# Patient Record
Sex: Female | Born: 1945 | Race: White | Hispanic: No | Smoking: Former smoker
Health system: Southern US, Community
[De-identification: ages and names within clinical notes are randomized; demographics above are authoritative.]

## PROBLEM LIST (undated history)

## (undated) DIAGNOSIS — Z923 Personal history of irradiation: Secondary | ICD-10-CM

## (undated) DIAGNOSIS — R252 Cramp and spasm: Secondary | ICD-10-CM

## (undated) DIAGNOSIS — D126 Benign neoplasm of colon, unspecified: Secondary | ICD-10-CM

## (undated) DIAGNOSIS — C55 Malignant neoplasm of uterus, part unspecified: Secondary | ICD-10-CM

## (undated) DIAGNOSIS — M1990 Unspecified osteoarthritis, unspecified site: Secondary | ICD-10-CM

## (undated) DIAGNOSIS — R7303 Prediabetes: Secondary | ICD-10-CM

## (undated) DIAGNOSIS — I10 Essential (primary) hypertension: Secondary | ICD-10-CM

## (undated) DIAGNOSIS — E785 Hyperlipidemia, unspecified: Secondary | ICD-10-CM

## (undated) DIAGNOSIS — R51 Headache: Secondary | ICD-10-CM

## (undated) DIAGNOSIS — I219 Acute myocardial infarction, unspecified: Secondary | ICD-10-CM

## (undated) DIAGNOSIS — E119 Type 2 diabetes mellitus without complications: Secondary | ICD-10-CM

## (undated) DIAGNOSIS — J189 Pneumonia, unspecified organism: Secondary | ICD-10-CM

## (undated) DIAGNOSIS — F419 Anxiety disorder, unspecified: Secondary | ICD-10-CM

## (undated) DIAGNOSIS — I2510 Atherosclerotic heart disease of native coronary artery without angina pectoris: Secondary | ICD-10-CM

## (undated) DIAGNOSIS — I209 Angina pectoris, unspecified: Secondary | ICD-10-CM

## (undated) DIAGNOSIS — K219 Gastro-esophageal reflux disease without esophagitis: Secondary | ICD-10-CM

## (undated) HISTORY — PX: COLONOSCOPY: SHX174

## (undated) HISTORY — PX: POLYPECTOMY: SHX149

## (undated) HISTORY — DX: Unspecified osteoarthritis, unspecified site: M19.90

## (undated) HISTORY — DX: Essential (primary) hypertension: I10

## (undated) HISTORY — DX: Benign neoplasm of colon, unspecified: D12.6

## (undated) HISTORY — DX: Prediabetes: R73.03

## (undated) HISTORY — PX: UPPER GASTROINTESTINAL ENDOSCOPY: SHX188

## (undated) HISTORY — DX: Hyperlipidemia, unspecified: E78.5

## (undated) HISTORY — DX: Atherosclerotic heart disease of native coronary artery without angina pectoris: I25.10

## (undated) HISTORY — PX: BACK SURGERY: SHX140

## (undated) HISTORY — DX: Malignant neoplasm of uterus, part unspecified: C55

## (undated) HISTORY — DX: Acute myocardial infarction, unspecified: I21.9

## (undated) HISTORY — DX: Cramp and spasm: R25.2

---

## 1994-06-13 HISTORY — PX: CORONARY ARTERY BYPASS GRAFT: SHX141

## 1996-07-09 ENCOUNTER — Encounter: Payer: Self-pay | Admitting: Gastroenterology

## 1998-04-20 ENCOUNTER — Other Ambulatory Visit: Admission: RE | Admit: 1998-04-20 | Discharge: 1998-04-20 | Payer: Self-pay | Admitting: Obstetrics & Gynecology

## 1998-08-05 ENCOUNTER — Other Ambulatory Visit: Admission: RE | Admit: 1998-08-05 | Discharge: 1998-08-05 | Payer: Self-pay | Admitting: Obstetrics & Gynecology

## 1998-08-05 ENCOUNTER — Other Ambulatory Visit: Admission: RE | Admit: 1998-08-05 | Discharge: 1998-08-05 | Payer: Self-pay

## 1999-05-24 ENCOUNTER — Other Ambulatory Visit: Admission: RE | Admit: 1999-05-24 | Discharge: 1999-05-24 | Payer: Self-pay | Admitting: Obstetrics and Gynecology

## 1999-08-04 ENCOUNTER — Encounter: Payer: Self-pay | Admitting: Gastroenterology

## 2000-06-08 ENCOUNTER — Other Ambulatory Visit: Admission: RE | Admit: 2000-06-08 | Discharge: 2000-06-08 | Payer: Self-pay | Admitting: Obstetrics and Gynecology

## 2000-08-21 ENCOUNTER — Encounter (INDEPENDENT_AMBULATORY_CARE_PROVIDER_SITE_OTHER): Payer: Self-pay

## 2000-08-21 ENCOUNTER — Encounter: Payer: Self-pay | Admitting: Gastroenterology

## 2000-08-21 ENCOUNTER — Other Ambulatory Visit: Admission: RE | Admit: 2000-08-21 | Discharge: 2000-08-21 | Payer: Self-pay | Admitting: Gastroenterology

## 2000-10-25 ENCOUNTER — Ambulatory Visit (HOSPITAL_COMMUNITY): Admission: RE | Admit: 2000-10-25 | Discharge: 2000-10-25 | Payer: Self-pay | Admitting: Cardiology

## 2000-10-27 ENCOUNTER — Encounter: Payer: Self-pay | Admitting: Cardiology

## 2000-10-27 ENCOUNTER — Ambulatory Visit (HOSPITAL_COMMUNITY): Admission: RE | Admit: 2000-10-27 | Discharge: 2000-10-27 | Payer: Self-pay | Admitting: Cardiology

## 2001-06-11 ENCOUNTER — Other Ambulatory Visit: Admission: RE | Admit: 2001-06-11 | Discharge: 2001-06-11 | Payer: Self-pay | Admitting: Obstetrics and Gynecology

## 2002-01-18 ENCOUNTER — Encounter: Payer: Self-pay | Admitting: Orthopedic Surgery

## 2002-01-25 ENCOUNTER — Inpatient Hospital Stay (HOSPITAL_COMMUNITY): Admission: RE | Admit: 2002-01-25 | Discharge: 2002-01-27 | Payer: Self-pay | Admitting: Orthopedic Surgery

## 2002-01-25 ENCOUNTER — Encounter: Payer: Self-pay | Admitting: Orthopedic Surgery

## 2002-06-14 ENCOUNTER — Other Ambulatory Visit: Admission: RE | Admit: 2002-06-14 | Discharge: 2002-06-14 | Payer: Self-pay | Admitting: Obstetrics and Gynecology

## 2003-06-19 ENCOUNTER — Other Ambulatory Visit: Admission: RE | Admit: 2003-06-19 | Discharge: 2003-06-19 | Payer: Self-pay | Admitting: Obstetrics and Gynecology

## 2003-07-20 ENCOUNTER — Emergency Department (HOSPITAL_COMMUNITY): Admission: EM | Admit: 2003-07-20 | Discharge: 2003-07-20 | Payer: Self-pay | Admitting: Emergency Medicine

## 2004-06-21 ENCOUNTER — Other Ambulatory Visit: Admission: RE | Admit: 2004-06-21 | Discharge: 2004-06-21 | Payer: Self-pay | Admitting: Obstetrics and Gynecology

## 2004-10-06 ENCOUNTER — Encounter: Payer: Self-pay | Admitting: Cardiology

## 2004-11-09 ENCOUNTER — Ambulatory Visit: Payer: Self-pay | Admitting: Gastroenterology

## 2005-06-28 ENCOUNTER — Other Ambulatory Visit: Admission: RE | Admit: 2005-06-28 | Discharge: 2005-06-28 | Payer: Self-pay | Admitting: Obstetrics and Gynecology

## 2005-10-25 ENCOUNTER — Ambulatory Visit: Payer: Self-pay | Admitting: Gastroenterology

## 2005-11-11 DIAGNOSIS — D126 Benign neoplasm of colon, unspecified: Secondary | ICD-10-CM

## 2005-11-11 HISTORY — DX: Benign neoplasm of colon, unspecified: D12.6

## 2005-12-02 ENCOUNTER — Ambulatory Visit: Payer: Self-pay | Admitting: Gastroenterology

## 2005-12-02 ENCOUNTER — Encounter (INDEPENDENT_AMBULATORY_CARE_PROVIDER_SITE_OTHER): Payer: Self-pay | Admitting: Specialist

## 2005-12-07 ENCOUNTER — Ambulatory Visit: Payer: Self-pay | Admitting: Gastroenterology

## 2006-01-03 ENCOUNTER — Ambulatory Visit: Payer: Self-pay | Admitting: Gastroenterology

## 2007-08-14 ENCOUNTER — Ambulatory Visit: Payer: Self-pay | Admitting: Gastroenterology

## 2008-07-23 ENCOUNTER — Telehealth: Payer: Self-pay | Admitting: Gastroenterology

## 2008-08-12 ENCOUNTER — Ambulatory Visit: Payer: Self-pay | Admitting: Gastroenterology

## 2008-08-12 DIAGNOSIS — Z860100 Personal history of colon polyps, unspecified: Secondary | ICD-10-CM | POA: Insufficient documentation

## 2008-08-12 DIAGNOSIS — K5150 Left sided colitis without complications: Secondary | ICD-10-CM | POA: Insufficient documentation

## 2008-08-25 ENCOUNTER — Telehealth: Payer: Self-pay | Admitting: Gastroenterology

## 2008-08-27 ENCOUNTER — Encounter: Payer: Self-pay | Admitting: Cardiology

## 2008-09-11 ENCOUNTER — Ambulatory Visit: Payer: Self-pay | Admitting: Gastroenterology

## 2008-09-19 ENCOUNTER — Ambulatory Visit: Payer: Self-pay | Admitting: Gastroenterology

## 2008-09-22 ENCOUNTER — Encounter: Payer: Self-pay | Admitting: Gastroenterology

## 2009-03-28 ENCOUNTER — Emergency Department (HOSPITAL_COMMUNITY): Admission: EM | Admit: 2009-03-28 | Discharge: 2009-03-28 | Payer: Self-pay | Admitting: Emergency Medicine

## 2009-07-02 ENCOUNTER — Telehealth: Payer: Self-pay | Admitting: Gastroenterology

## 2009-08-17 ENCOUNTER — Ambulatory Visit: Payer: Self-pay | Admitting: Gastroenterology

## 2009-08-17 DIAGNOSIS — K219 Gastro-esophageal reflux disease without esophagitis: Secondary | ICD-10-CM | POA: Insufficient documentation

## 2009-12-24 ENCOUNTER — Encounter: Admission: RE | Admit: 2009-12-24 | Discharge: 2009-12-24 | Payer: Self-pay | Admitting: Family Medicine

## 2010-01-27 ENCOUNTER — Ambulatory Visit: Payer: Self-pay | Admitting: Cardiology

## 2010-04-20 ENCOUNTER — Encounter: Payer: Self-pay | Admitting: Gastroenterology

## 2010-07-13 NOTE — Procedures (Signed)
Summary: Colonoscopy   Colonoscopy  Procedure date:  09/19/2008  Findings:      Location:  [place].    Procedures Next Due Date:    Colonoscopy: 09/2010  COLONOSCOPY PROCEDURE REPORT  PATIENT:  [name] [name], [name] [name]  MR#:  [id number] BIRTHDATE:   06-29-1945, 65 yrs. old   GENDER:   female  ENDOSCOPIST:   Norberto Sorenson T. Fuller Plan, MD, [facility]    PROCEDURE DATE:  09/19/2008 PROCEDURE:  Colonoscopy with biopsies ASA CLASS:   Class II INDICATIONS: 1) follow-up of chronic ulcerative colitis  2) follow-up of polyp   MEDICATIONS:    Fentanyl 100 mcg IV, Versed 8 mg IV  DESCRIPTION OF PROCEDURE:   After the risks benefits and alternatives of the procedure were thoroughly explained, informed consent was obtained.  Digital rectal exam was performed and revealed no abnormalities.   The LB PCF-H180AL [id number] endoscope was introduced through the anus and advanced to the cecum, which was identified by both the appendix and ileocecal valve, without limitations.  The quality of the prep was excellent, using MoviPrep.  The instrument was then slowly withdrawn as the colon was fully examined. <<PROCEDUREIMAGES>>            <<OLD IMAGES>>  FINDINGS:  There were mucosal changes consistent with left-sided ulcerative colitis. in the rectum and sigmoid colon. They were erythematous and granular. Multiple biopsies were obtained and sent to pathology.  This was otherwise a normal examination of the colon. Multiple biopsies were obtained and sent to pathology.   Retroflexed views in the rectum revealed proctitis.    The time to cecum =  5  minutes. The scope was then withdrawn (time =  6.75  min) from the patient and the procedure completed.  COMPLICATIONS:   None  ENDOSCOPIC IMPRESSION:  1) Colitis - in the rectum and sigmoid colon  RECOMMENDATIONS:  1) await pathology results  2) continue current medications  REPEAT EXAM:   In 2 year(s) for Colonoscopy.    Pricilla Riffle. Fuller Plan, MD,  [facility]    CC:   REPORT OF SURGICAL PATHOLOGY   Case #: [id number] Patient Name: [name] [name]. Office Chart Number:  N/A   MRN: [id number] Pathologist: Lennox Solders. Lyndon Code, MD DOB/Age  [birth year] (Age: 65)    Gender: F Date Taken:  09/19/2008 Date Received: 09/19/2008   FINAL DIAGNOSIS   ***MICROSCOPIC EXAMINATION AND DIAGNOSIS***   1.  COLON, RANDOM, BIOPSY: -  BENIGN COLONIC MUCOSA.   -  NO SIGNIFICANT INFLAMMATION OR OTHER ABNORMALITIES IDENTIFIED.    2.  COLON, RECTOSIGMOID, BIOPSY:   -  CHRONIC QUIESCENT COLITIS. -  THERE IS NO EVIDENCE OF MALIGNANCY.   kv Date Reported:  09/22/2008     Vonna Kotyk B. Lyndon Code, MD *** Electronically Signed Out By [facility] ***   September 22, 2008 MRN: [id number]      [address] [place], [place]  [address]    Dear [name] [name],  I am pleased to inform you that the biopsies taken during your recent colonoscopy did not show any evidence of cancer upon pathologic examination. The biopies showed mild colitis.   Continue with the treatment plan as outlined on the day of your      exam.  You should have a repeat colonoscopy examination for this problem           in 2 years.  Please call us if you are having persistent problems or have questions about your condition that have not been fully answered  at this time.  Sincerely,  Ladene Artist MD [facility]  This letter has been electronically signed by your physician.  This report was created from the original endoscopy report, which was reviewed and signed by the above listed endoscopist.

## 2010-07-13 NOTE — Miscellaneous (Signed)
Summary: previsit  Clinical Lists Changes  Medications: Added new medication of MOVIPREP 100 GM  SOLR (PEG-KCL-NACL-NASULF-NA ASC-C) As per prep instructions. - Signed Rx of MOVIPREP 100 GM  SOLR (PEG-KCL-NACL-NASULF-NA ASC-C) As per prep instructions.;  #1 x 0;  Signed;  Entered by: Thurston Pounds RN II;  Authorized by: Ladene Artist MD [facility];  Method used: Electronically to [facility]. # [id number]*, [address] [place], [place], [place]  [address], Ph: [phone] or [phone], Fax: [phone] Observations: Added new observation of ALLERGY REV: Done (09/11/2008 8:06) Added new observation of NKA: T (09/11/2008 8:06)    Prescriptions: MOVIPREP 100 GM  SOLR (PEG-KCL-NACL-NASULF-NA ASC-C) As per prep instructions.  #1 x 0   Entered by:   Thurston Pounds RN II   Authorized by:   Ladene Artist MD [facility]   Signed by:   Thurston Pounds RN II on 09/11/2008   Method used:   Electronically to        [facility]. # [id number]* (retail)       [address]       [place], [place]  [address]       Ph: [phone] or [phone]       Fax: [phone]   RxID:   [id number]

## 2010-07-13 NOTE — Progress Notes (Signed)
Summary: needs rx called in  Medications Added ASACOL HD 800 MG TBEC (MESALAMINE) Take 1 tablet by mouth three times a day       Phone Note Call from Patient Call back at Home Phone [phone]   Caller: Patient Call For: STARK Reason for Call: Talk to Nurse Summary of Call: Patient states that she tried that samples of asacol and meds seems to ber working good and would like an rx called in to [facility] on [address]  Initial call taken by: Ronalee Red,  August 25, 2008 9:39 AM  Follow-up for Phone Call        Patient notified that Rx has been sent to requested pharmacy. Follow-up by: Awilda Bill CMA,  August 25, 2008 10:32 AM    New/Updated Medications: ASACOL HD 800 MG TBEC (MESALAMINE) Take 1 tablet by mouth three times a day   Prescriptions: ASACOL HD 800 MG TBEC (MESALAMINE) Take 1 tablet by mouth three times a day  #90 x 5   Entered by:   Awilda Bill CMA   Authorized by:   Ladene Artist MD [facility]   Signed by:   Awilda Bill CMA on 08/25/2008   Method used:   Electronically to        [facility]. # [id number]* (retail)       [address]       [place], [place]  [address]       Ph: [phone] or [phone]       Fax: [phone]   RxID:   [id number]

## 2010-07-13 NOTE — Procedures (Signed)
Summary: Colonoscopy  Colonoscopy   Imported By: [place] 08/11/2008 10:51:04  _____________________________________________________________________  External Attachment:    Type:   Image     Comment:   External Document

## 2010-07-13 NOTE — Letter (Signed)
Summary: Patient Notice- Colon Biospy Results  [facility]  [address] [place], [place] [address]   Phone: [phone]  Fax: [phone]        September 22, 2008 MRN: [id number]    [name] [name] [address], [place]  [address]    Dear Ms. Domingos,  I am pleased to inform you that the biopsies taken during your recent colonoscopy did not show any evidence of cancer upon pathologic examination. The biopies showed mild colitis.   Continue with the treatment plan as outlined on the day of your      exam.  You should have a repeat colonoscopy examination for this problem           in 2 years.  Please call us if you are having persistent problems or have questions about your condition that have not been fully answered at this time.  Sincerely,  Ladene Artist MD [facility]  This letter has been electronically signed by your physician.

## 2010-07-13 NOTE — Procedures (Signed)
Summary: Colonoscopy and pathology   Colonoscopy  Procedure date:  12/02/2005  Findings:      Results: Colitis.       Pathology:  Adenomatous polyp.        Location:  [place].    Procedures Next Due Date:    Colonoscopy: 12/2010  Colonoscopy  Procedure date:  12/02/2005  Findings:      Results: Colitis.       Pathology:  Adenomatous polyp.        Location:  [place].    Procedures Next Due Date:    Colonoscopy: 12/2010  Patient Name: [name] [name], [name] [name] MRN:  Procedure Procedures: Colonoscopy CPT: 62694.    with biopsy. CPT: X8550940.    with polypectomy. CPT: X5071110.  Personnel: Endoscopist: Pricilla Riffle. Fuller Plan, MD, Marval Regal.  Exam Location: Exam performed in Outpatient Clinic. Outpatient  Patient Consent: Procedure, Alternatives, Risks and Benefits discussed, consent obtained, from patient. Consent was obtained by the RN.  Indications Symptoms: Diarrhea Hematochezia.  Surveillance of: Ulcerative Colitis.  History  Current Medications: Patient is not currently taking Coumadin.  Pre-Exam Physical: Performed Dec 02, 2005. Cardio-pulmonary exam, Rectal exam, HEENT exam , Abdominal exam, Mental status exam WNL.  Comments: Pt. history reviewed/updated, physical exam performed prior to initiation of sedation?Yes Exam Exam: Extent of exam reached: Cecum, extent intended: Cecum.  The cecum was identified by appendiceal orifice and IC valve. Time to Cecum: 00:06: 35. Time for Withdrawl: 00:11:30. Colon retroflexion performed. ASA Classification: II. Tolerance: excellent.  Monitoring: Pulse and BP monitoring, Oximetry used. Supplemental O2 given.  Colon Prep Used MoviPrep for colon prep. Prep results: fair, exam compromised.  Sedation Meds: Patient assessed and found to be appropriate for moderate (conscious) sedation. Fentanyl 75 mcg. given IV. Versed 7 mg. given IV.  Findings POLYP: Descending Colon, Maximum size: 4 mm. sessile  polyp. Procedure:  snare without cautery, removed, retrieved, Polyp sent to pathology. ICD9: Colon Polyps: 211.3.  NORMAL EXAM: Cecum to Hepatic Flexure. Biopsy/Normal Exam taken. Comments: random biopsies taken in the asc and trans colon.  - MUCOSAL ABNORMALITY: Sigmoid Colon to Rectum. Erosions present, Pseudo polyps absent, Granularity present, bleeding present, ulcers absent, Haustral folds diminished. Friability: spontaneous hemorrhage. Activity level severe, Endoscopic Extent of Disease: Left-sided Colitis. Biopsy/Mucosal Abn. taken. ICD9: Colitis, Ulcerative, Left sided: 556.6. Comments: colitis to 35cm.   Assessment  Diagnoses: 211.3: Colon Polyps.  556.6: Colitis, Ulcerative, Left sided.   Events  Unplanned Interventions: No intervention was required.  Unplanned Events: There were no complications. Plans  Post Exam Instructions: Post sedation instructions given.  Medication Plan: Await pathology. Antibiotics: Flagyl/Metronidazole 500 BID, for 7 days.  Steroids: Prednisone 40 mg QD,   Patient Education: Patient given standard instructions for: Polyps. Colitis.  Disposition: After procedure patient sent to recovery. After recovery patient sent home.  Scheduling/Referral: Colonoscopy, to [facility] T. Fuller Plan, MD, [facility], around Dec 03, 2010.  Office Visit, to [facility] [employer]. Fuller Plan, MD, [facility], around Jan 01, 2006.    This report was created from the original endoscopy report, which was reviewed and signed by the above listed endoscopist.              SP Surgical Pathology - STATUS: Final             By: Saralyn Pilar MD , Chrystie Nose        Perform Date: 22Jun07 00:01  Ordered By: Dagoberto Ligas MD , [place]         Ordered Date: 23Jun07 12:38  Facility: [facility]                               Department: [place] Report Text  [facility]   [address], [place] [address]   Telephone [phone] or [phone] Fax [phone]     REPORT OF SURGICAL PATHOLOGY    Case #: [id number]   Patient Name: [name] [name].   Office Chart Number: [id number]    MRN: [id number]   Pathologist: Chrystie Nose. Saralyn Pilar, MD   DOB/Age Feb 04, 1946 (Age: 65) Gender: F   Date Taken: 12/02/2005   Date Received: 12/02/2005    FINAL DIAGNOSIS    ***MICROSCOPIC EXAMINATION AND DIAGNOSIS***    1. COLON, ASCENDING AND PROXIMAL TRANSVERSE BIOPSIES: BENIGN   COLONIC MUCOSA. NO ACTIVE INFLAMMATION, MICROSCOPIC COLITIS OR   DYSPLASIA.    2. COLON, DESCENDING POLYP: TUBULAR ADENOMA, SEE COMMENT.    3. COLON, SIGMOID AND RECTAL BIOPSIES: CHRONIC ACTIVE COLITIS   CONSISTENT WITH INFLAMMATORY BOWEL DISEASE. NEGATIVE FOR   DYSPLASIA.    COMMENT   1. There are fragments of colonic mucosa with no objective   increase in inflammation. There is no active inflammation or   granulomas or significant chronic changes. There is no evidence   of dysplasia.    2. The adenoma from the descending polyp most likely represents   a sporadic adenoma in a patent with inflammatory bowel disease if   this polyp originated from an area of non-inflamed mucosa.    3. The sigmoid and rectal biopsies show marked chronic active   colitis characterized by extension of the lamina propria by   inflammatory infiltrates associated with crypt distortion, focal   ulceration and patchy, mild neutrophilic cryptitis. Focally,   some of the crypts show reactive changes characterized by a   slight nuclear stratification and increased numbers of mitotic   figures but these features are in a setting of chronic active   inflammation and dysplastic features are not identified.   JDP:mw 12/05/05    mw   Date Reported: 12/05/2005 Jenny Reichmann D. Saralyn Pilar, MD   *** Electronically Signed Out By JDP ***    Clinical information   Bloody diarrhea hx of ulcerative colitis (gt)    specimen(s) obtained   1: Colon, biopsy, ascending, proximal, and transverse   2: Colon, polyp(s), descending   3:  Rectum, biopsy, sigmoid    Gross Description   1. Received in formalin are tan, soft tissue fragments that are   submitted in toto. Number: 2   Size: 0.2 to 0.3 cm one block    2. Received in formalin is a tan, soft tissue fragment that is   submitted in toto. Size: 0.4 cm one block    3. Received in formalin are tan, soft tissue fragments that are   submitted in toto. Number: multiple   Size: 0.2 to 0.3 cm one block (MA:jes, 12/05/05)    jes/

## 2010-07-13 NOTE — Procedures (Signed)
Summary: Flexible Sigmoidoscopy and path   Flexible Sigmoidoscopy  Procedure date:  08/21/2000  Findings:      Ulcerative Colitis Quality of Study: Good.   Flexible Sigmoidoscopy  Procedure date:  08/21/2000  Findings:      Ulcerative Colitis Quality of Study: Good.   Patient Name: [name] [name], [name] [name] MRN:  Procedure Procedures: Flexible Proctosigmoidoscopy CPT: [phone].    with biopsy. CPT: 84166.  Personnel: Endoscopist: Pricilla Riffle. Fuller Plan, MD, Marval Regal.  Referred By: Nicoletta Dress, MD.  Indications Symptoms: Hematochezia.  History  Pre-Exam Physical: Performed Aug 21, 2000. Cardio-pulmonary exam  WNL. Rectal exam, HEENT exam , Abdominal exam, Extremity exam, Neurological exam, Mental status exam WNL.  Exam Exam: Extent visualized: Descending Colon. Extent of exam: 45 cm. Patient position: on left side. Colon retroflexion performed. Images taken. ASA Classification: I. Tolerance: good.  Colon Prep Used Fleets enema for colon prep. Prep: good.  Findings NORMAL EXAM: Descending Colon.  ULCERATIVE COLITIS: Sigmoid Colon to Rectum. granular present, Friability: Exists. Activity level active, Biopsy/Misc Colitis taken. ICD9: Colitis, Ulcerative: 556.9.   Assessment Abnormal examination, see findings above.  Diagnoses: 556.9: Colitis, Ulcerative.   Events  Unplanned Intervention: No intervention was required.  Unplanned Events: There were no complications. Plans Medication Plan: Await pathology. Continue current medications. Steroids: Hydrocortisone Foam 100 mg QD, starting Aug 21, 2000 for 3 wks.   Patient Education: Patient given standard instructions for: Colitis.  Disposition: After procedure patient sent to recovery. After recovery patient sent home.  Scheduling: Clinic Visit, to [facility] T. Fuller Plan, MD, [facility], around Sep 21, 2000.    This report was created from the original endoscopy report, which was reviewed and signed by the above listed  endoscopist.    cc: Nicoletta Dress, MD           SP Surgical Pathology - STATUS: Final             By: Marval Regal MD , Joyice Faster       Perform Date: 11Mar02 16:42  Ordered By: Dagoberto Ligas MD , MALCOLM         Ordered Date:  Facility: [facility]                              Department: [facility]   [address] [place], [place] [address]   [phone]    REPORT OF SURGICAL PATHOLOGY    Case #: [id number]   Patient Name: [name] [name], [name] [name]   PID: [id number]   Pathologist: Valente David, MD   DOB/Age 11-13-1945 (Age: 65) Gender: F   Date Taken: 08/21/2000   Date Received: 08/21/2000    FINAL DIAGNOSIS    ***MICROSCOPIC EXAMINATION AND DIAGNOSIS***    COLON BIOPSY: CHRONIC ACTIVE COLITIS CONSISTENT WITH   INFLAMMATORY BOWEL DISEASE, SEE COMMENT    COMMENT   There is a relatively diffuse chronic active colitis with foci of   active cryptitis and plasmacytosis associated with distortion.   This is not microscopic colitis and the differential is between   ulcerative colitis, which is favored due to the diffuse nature of   the inflammation, and Crohn' s disease. Clinical correlation is   suggested.    kv   Date Reported: 08/22/2000 Valente David, MD   *** Electronically Signed Out By TAZ ***    Clinical information   Bleeding, R/O microscopic colitis. (tmc)    specimen(s)  obtained   Colon, biopsy    Gross Description   Received in formalin are tan, soft tissue fragments that are   submitted in toto. Number: 3   Size: 0.1 to 0.2 cm (SSW:smr 3/11)    smr/

## 2010-07-13 NOTE — Assessment & Plan Note (Signed)
Summary: Ulcerative colitis f/u, med refills   History of Present Illness Visit Type: Follow-up Visit Primary GI MD: Elie Goody MD [facility] Primary Provider: Merri Brunette, MD Chief Complaint: ulcerative colitis, med refills/insurance changes; no recent flares of uc History of Present Illness:   [name] [name] returns for followup with no ongoing colorectal complaints. She feels her colitis is under excellent control on Asacol and requesting refill. She has rare episodes reflux.   GI Review of Systems      Denies abdominal pain, acid reflux, belching, bloating, chest pain, dysphagia with liquids, dysphagia with solids, heartburn, loss of appetite, nausea, vomiting, vomiting blood, weight loss, and  weight gain.        Denies anal fissure, black tarry stools, change in bowel habit, constipation, diarrhea, diverticulosis, fecal incontinence, heme positive stool, hemorrhoids, irritable bowel syndrome, jaundice, light color stool, liver problems, rectal bleeding, and  rectal pain.   Current Medications (verified): 1)  Asacol Hd 800 Mg Tbec (Mesalamine) .... Take 1 Tablet By Mouth Three Times A Day 2)  Aspirin 81 Mg  Tabs (Aspirin) .... One Tablet By Mouth Once Daily 3)  Diovan 160 Mg Tabs (Valsartan) .... One Tablet By Mouth Once Daily 4)  Crestor 20 Mg Tabs (Rosuvastatin Calcium) .... One Tablet By Mouth Once Daily 5)  Viactiv 500-500-40 Mg-Unt-Mcg Chew (Calcium-Vitamin D-Vitamin K) .... Once Daily 6)  Vitamin D 2000iu .... Once Daily  Allergies (verified): No Known Drug Allergies  Past History:  Past Medical History: Reviewed history from 08/11/2008 and no changes required. 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 Back surgery CABG 1996  Family History: Reviewed history from 08/12/2008 and no changes required. Lung cancer:  Brother Family History of Breast Cancer: Sister Family History of Kidney Disease: Brother (renal cancer) Family History of "female cancer": Sister No FH of Colon Cancer: Family History of Diabetes: Mother Family History of Heart Disease: Sister, Mother  Social History: Reviewed history from 08/12/2008 and no changes required. Patient is a former smoker. -stopped in 1996 Alcohol Use - no Illicit Drug Use - no Patient gets regular exercise.  Review of Systems  The patient denies allergy/sinus, anemia, anxiety-new, arthritis/joint pain, back pain, blood in urine, breast changes/lumps, change in vision, confusion, cough, coughing up blood, depression-new, fainting, fatigue, fever, headaches-new, hearing problems, heart murmur, heart rhythm changes, itching, menstrual pain, muscle pains/cramps, night sweats, nosebleeds, pregnancy symptoms, shortness of breath, skin rash, sleeping problems, sore throat, swelling of feet/legs, swollen lymph glands, thirst - excessive , urination - excessive , urination changes/pain, urine leakage, vision changes, and voice change.    Vital Signs:  Patient profile:   65 year old female Height:      66 inches Weight:      177.38 pounds BMI:     28.73 Pulse rate:   64 / minute Pulse rhythm:   regular BP sitting:   120 / 60  (left arm) Cuff size:   regular  Vitals Entered By: [name] [name] CMA Duncan Dull) (August 17, 2009 4:01 PM)  Physical Exam  General:  Well developed, well nourished, no acute distress. Head:  Normocephalic and atraumatic. Eyes:  PERRLA, no icterus. Mouth:  No deformity or lesions, dentition normal. Lungs:  Clear throughout to auscultation. Heart:  Regular rate and rhythm; no murmurs, rubs,  or bruits. Abdomen:  Soft, nontender and nondistended. No masses, hepatosplenomegaly or hernias noted. Normal bowel sounds. Psych:  Alert and cooperative. Normal mood and affect.  Impression & Recommendations:  Problem # 1:  ULCERATIVE COLITIS-LEFT  SIDE (ICD-556.5) Ulcerative proctosigmoiditis, under excellent control. Continue Asacol HD 800 mg t.i.d. Surveillance colonoscopy recommended in April 2012.  Problem # 2:  GERD (ICD-530.81) Infrequently symptomatic GERD controlled with dietary measures and p.r.n. antacids usage  Problem # 3:  PERSONAL HX COLONIC POLYPS (ICD-V12.72) Prior history of adenomatous colon polyps. Colonoscopy as in problem #1.  Patient Instructions: 1)  Rx given to pt to mail to mail order pharmacy.  2)  Please schedule a follow-up appointment in 1 year. 3)  Copy sent to : Merri Brunette, MD 4)  The medication list was reviewed and reconciled.  All changed / newly prescribed medications were explained.  A complete medication list was provided to the patient / caregiver.  Prescriptions: ASACOL HD 800 MG TBEC (MESALAMINE) Take 1 tablet by mouth three times a day  #270 x 3   Entered by:   [name] [name] CMA (AAMA)   Authorized by:   Meryl Dare MD [facility]   Signed by:   [name] [name] CMA (AAMA) on 08/17/2009   Method used:   Print then Give to Patient   RxID:   [id number]

## 2010-07-13 NOTE — Progress Notes (Signed)
Summary: resend rx   Phone Note Call from Patient Call back at Home Phone [phone]   Caller: Patient Call For: stark Reason for Call: Talk to Nurse Summary of Call: Patient needs a new rx sent to her [facility] mail service for asacol  Initial call taken by: Ronalee Red,  July 23, 2008 11:02 AM  Follow-up for Phone Call        Pt wanted refill on Asacol sent to [facility] mail order pharmacy. I told pt she needs to schedule a office visit for a f/u appt and I can give her one refill till her appt. Pt agreed but states she doesnt need the refill yet. Appt made for 08-12-08 at 4:00pm.  Follow-up by: Marlon Pel CMA,  July 23, 2008 11:17 AM

## 2010-07-13 NOTE — Miscellaneous (Signed)
Summary: [facility] refill  Clinical Lists Changes  Medications: Changed medication from ASACOL HD 800 MG TBEC (MESALAMINE) Take 1 tablet by mouth three times a day to ASACOL HD 800 MG TBEC (MESALAMINE) Take 1 tablet by mouth three times a day - Signed Rx of ASACOL HD 800 MG TBEC (MESALAMINE) Take 1 tablet by mouth three times a day;  #270 x 0;  Signed;  Entered by: Marlon Pel CMA (AAMA);  Authorized by: Ladene Artist MD [facility];  Method used: [facility], [address] [place]  [address], Ph: [phone], Fax: [phone]    Prescriptions: ASACOL HD 800 MG TBEC (MESALAMINE) Take 1 tablet by mouth three times a day  #270 x 0   Entered by:   Marlon Pel CMA ([place])   Authorized by:   Ladene Artist MD [facility]   Signed by:   Marlon Pel CMA ([place]) on 04/20/2010   Method used:   Faxed to ...       [facility]       [address] [place]  [address]       Ph: [phone]       Fax: [phone]   RxID:   [id number]

## 2010-07-13 NOTE — Progress Notes (Signed)
Summary: rx mailed  Medications Added ASACOL HD 800 MG TBEC (MESALAMINE) Take 1 tablet by mouth three times a day       Phone Note Call from Patient Call back at Home Phone [phone]   Caller: Patient Call For: Russella Dar Reason for Call: Talk to Nurse Summary of Call: Patient states that her insurance changed and she needs a new rx mailed to her home for 90 days supply for her Asacol    Initial call taken by: Tawni Levy,  July 02, 2009 1:18 PM  Follow-up for Phone Call        Left a message for pt to call back and schedule a REV to get one refill until her office visit. Pt hasn't been seen since 08/2008. Follow-up by: Christie Nottingham CMA Duncan Dull),  July 02, 2009 2:02 PM  Additional Follow-up for Phone Call Additional follow up Details #1::        Pt made a appt for 08/17/09 and states she would really like a rx mailed to her home if she kept her appt in March. I told her I could do that as long as she kept her appt. Rx mailed to pt's home.  Additional Follow-up by: Christie Nottingham CMA (AAMA),  July 06, 2009 1:23 PM    New/Updated Medications: ASACOL HD 800 MG TBEC (MESALAMINE) Take 1 tablet by mouth three times a day Prescriptions: ASACOL HD 800 MG TBEC (MESALAMINE) Take 1 tablet by mouth three times a day  #270 x 1   Entered by:   Christie Nottingham CMA (AAMA)   Authorized by:   Meryl Dare MD [facility]   Signed by:   Christie Nottingham CMA (AAMA) on 07/06/2009   Method used:   Print then Mail to Patient   RxID:   [id number]

## 2010-07-13 NOTE — Assessment & Plan Note (Signed)
Summary: f/u colitis, med refills   History of Present Illness Visit Type: follow up Primary GI MD: Joylene Igo MD [facility] Primary Jaylynn Siefert: [name], MD Chief Complaint: Patient here for routine f/u colitis.  Patient also needs refills on asacol and would like to talk about the possibility of decreasing dosage.  Patient states that she is doing well and has no GI complaints. History of Present Illness:   [name] returns for followup of left-sided colitis. She has had no active symptoms for the past several months. She has no active reflux symptoms and requires no medications. Last colonoscopy in June 2007.   GI Review of Systems      Denies abdominal pain, acid reflux, belching, bloating, chest pain, dysphagia with liquids, dysphagia with solids, heartburn, loss of appetite, nausea, vomiting, vomiting blood, weight loss, and  weight gain.        Denies anal fissure, black tarry stools, change in bowel habit, constipation, diarrhea, diverticulosis, fecal incontinence, heme positive stool, hemorrhoids, irritable bowel syndrome, jaundice, light color stool, liver problems, rectal bleeding, and  rectal pain.   Prior Medications Reviewed Using: Patient Recall  Updated Prior Medication List: ASACOL 400 MG TBEC (MESALAMINE) 3 tablets by mouth three times a day ASPIRIN 81 MG  TABS (ASPIRIN) one tablet by mouth once daily DIOVAN 160 MG TABS (VALSARTAN) one tablet by mouth once daily CRESTOR 20 MG TABS (ROSUVASTATIN CALCIUM) one tablet by mouth once daily ONE-A-DAY WEIGHT SMART ADVANCE  TABS (MULTIPLE VITAMINS-MINERALS) Take 1 tablet by mouth once a day  Current Allergies (reviewed today): No known allergies  Past Medical History:    Reviewed history from 08/11/2008 and no changes required:       Ulcerative proctosigmoiditis 1992       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 -stopped in 1996    Alcohol Use - no    Illicit Drug Use - no    Patient gets regular exercise.  Risk Factors:  Drug use:  no Alcohol use:  no Exercise:  yes  Vital Signs:  Patient Profile:   65 Years Old Female Height:     66 inches Weight:      177 pounds BMI:     28.67 BSA:     1.90 Pulse rate:   84 / minute Pulse rhythm:   regular BP sitting:   112 / 58  (left arm)  Vitals Entered By: Awilda Bill CMA (August 12, 2008 4:03 PM)                  Physical Exam  General:     Well developed, well nourished, no acute distress. Head:     Normocephalic and atraumatic. Eyes:  PERRLA, no icterus. Mouth:     No deformity or lesions, dentition normal. Lungs:     Clear throughout to auscultation. Heart:     Regular rate and rhythm; no murmurs, rubs,  or bruits. Abdomen:     Soft, nontender and nondistended. No masses, hepatosplenomegaly or hernias noted. Normal bowel sounds. Psych:     Alert and cooperative. Normal mood and affect.   Impression & Recommendations:  Problem # 1:  ULCERATIVE COLITIS-LEFT SIDE (ICD-556.5) Chronic left-sided ulcerative colitis diagnosed approximately in 1992. Given her history of left-sided colitis for approximately 18 years, I recommended every 2 year surveillance colonoscopies. We had a discussion regarding the increased risk of colorectal cancer in chronic ulcerative colitis and the need for surveillance colonoscopies with biopsies. We also discussed the potential long-term benefit of chemoprophylaxis with a  5-ASA agent on a long-term basis. She states she would like to decrease her dose Asacol since her symptoms are controlled. Change Asacol 800 mg HD t.i.d. She is due for surveillance colonoscopy but declined to schedule at this time. She states she would like to call back schedule in the near future.  Problem # 2:  PERSONAL HX COLONIC POLYPS (ICD-V12.72) Personal history of adenomatous colon polyps initially diagnosed in June 2007. She was placed on a five-year surveillance interval however this is preceded by her colitis followup interval.   Patient Instructions: 1)  Start Asacol HD one tablet by mouth three times a day and call Estill Bamberg when samples run out and I will send your in a 90 day supply to your mail order.  2)  Call us back to schedule colonoscopy.  3)  Copy Sent To: [name], MD

## 2010-07-13 NOTE — Procedures (Signed)
Summary: Colonoscopy  Colonoscopy   Imported By: [place] 08/11/2008 10:51:26  _____________________________________________________________________  External Attachment:    Type:   Image     Comment:   External Document

## 2010-07-30 ENCOUNTER — Ambulatory Visit (INDEPENDENT_AMBULATORY_CARE_PROVIDER_SITE_OTHER): Payer: PRIVATE HEALTH INSURANCE | Admitting: *Deleted

## 2010-07-30 DIAGNOSIS — Z951 Presence of aortocoronary bypass graft: Secondary | ICD-10-CM

## 2010-07-30 DIAGNOSIS — E7800 Pure hypercholesterolemia, unspecified: Secondary | ICD-10-CM

## 2010-07-30 DIAGNOSIS — I10 Essential (primary) hypertension: Secondary | ICD-10-CM

## 2010-07-30 DIAGNOSIS — I2510 Atherosclerotic heart disease of native coronary artery without angina pectoris: Secondary | ICD-10-CM

## 2010-08-24 ENCOUNTER — Other Ambulatory Visit (INDEPENDENT_AMBULATORY_CARE_PROVIDER_SITE_OTHER): Payer: PRIVATE HEALTH INSURANCE

## 2010-08-24 DIAGNOSIS — Z79899 Other long term (current) drug therapy: Secondary | ICD-10-CM

## 2010-08-30 ENCOUNTER — Other Ambulatory Visit: Payer: PRIVATE HEALTH INSURANCE

## 2010-10-14 ENCOUNTER — Other Ambulatory Visit: Payer: Self-pay | Admitting: Gastroenterology

## 2010-10-14 MED ORDER — MESALAMINE 800 MG PO TBEC: 1.0000 | DELAYED_RELEASE_TABLET | Freq: Three times a day (TID) | ORAL | Status: DC

## 2010-10-14 NOTE — Telephone Encounter (Signed)
Left a message for pt telling her that I send a prescription for Asacol to [facility] for 90 day supply with no refills until she keeps her appt for her Colonoscopy. Also left her a discount card out front for her to pick up.

## 2010-10-26 NOTE — Assessment & Plan Note (Signed)
[place] OFFICE NOTE   EMORY, LEAVER                      MRN:          [id number]  DATE:08/14/2007                            DOB:          [birth year]    Mrs. Behrens returns for a two-week history of rectal bleeding.  She  notes some mucus in her stools, but has not noted diarrhea.  She began  using Rowasa enemas at bedtime and the bleeding has turned from bright  red to darker red and the volume has decreased.  She has no recent  antibiotic usage.  She denies any recent antibiotic usage.  She denies  any abdominal pain, fevers, chills, or rashes.  Her appetite is good and  her weight is stable.   CURRENT MEDICATIONS:  Listed on the chart, updated and reviewed.   MEDICATION ALLERGIES:  None known.   EXAM:  No acute distress.  Weight 179.8 pounds, blood pressure 122/78, pulse 68 and regular.  CHEST:  Clear to auscultation bilaterally.  CARDIAC:  Regular rate and rhythm without murmurs.  ABDOMEN:  Soft and nontender with normoactive bowel sounds, no palpable  organomegaly, masses or hernias.   ASSESSMENT AND PLAN:  Presumed flare of left-sided ulcerative colitis.  Increase Asacol to 1.6 g t.i.d.  Continue Rowasa enemas q.h.s. for two  weeks and, if her symptoms substantially improved, she may change to  every other night for two weeks and then discontinue the enemas 2 weeks  later.  She will contact us if her symptoms to not respond as expected.  Return office visit in four to six weeks.     Pricilla Riffle. Fuller Plan, MD, [facility]  Electronically Signed    MTS/MedQ  DD: 08/17/2007  DT: 08/17/2007  Job #: [id number]

## 2010-10-29 NOTE — Cardiovascular Report (Signed)
[place]. [facility]  Patient:    [name] [name], [name] [name]                        MRN: [id number] Proc. Date: 10/25/00 Attending:  Ludwig Lean. Doreatha Lew, M.D. CC:         Cardiac Catheterization Lab   Cardiac Catheterization  HISTORY:  [name] has had an acute anterior infarction with an angioplasty and subsequently leading to coronary artery bypass grafting in 1998.  She presents with persistent left-sided chest pain and extreme fatigue and is referred now for cardiac catheterization.  PROCEDURE:  Left heart catheterization with selective coronary angiography, left ventricular angiography.  TYPE AND SITE OF ENTRY:  Percutaneous right femoral artery with Perclose.  MEDICATIONS GIVEN PRIOR TO PROCEDURE:  Valium 10 mg p.o.  MEDICATIONS GIVEN DURING PROCEDURE:  Versed 2 mg IV, Ancef 1 g IV.  CONTRAST MATERIAL:  Omnipaque.  COMMENTS:  The patient tolerated the procedure well.  HEMODYNAMIC DATA:  The aortic pressure was 139/73.  LV is 139/14.  There was no aortic valve gradient noted on pullback.  ANGIOGRAPHIC DATA:  The left ventricular angiogram was performed in the RAO position.  Overall cardiac size and silhouette are normal.  There was very mild mid anterior hypokinesis with a global ejection fraction of 60+%.  There is no mitral regurgitation, intracardiac calcification, or intracavitary filling defect.  Coronary arteries: 1. The left internal mammary graft to the LAD is widely patent.  This is with    an excellent insertion site and excellent filling, including retrograde    filling of the diagonal branches.  This is an excellent graft to the LAD. 2. The saphenous vein graft to the right coronary artery is widely patent with    a nice insertion and good distal runoff. 3. The native right coronary artery is totally occluded in the mid portion. 4. The left main coronary artery is short and normal. 5. The left circumflex is widely patent.  There is mild  irregularity in the    mid portion.  The circumflex is mainly continued as a tortuous marginal    vessel which is widely patent. 6. The left anterior descending is totally occluded proximally.  OVERALL IMPRESSION: 1. Well preserved left ventricular function with very mild anterior    hypokinesis. 2. Totally occluded left anterior descending and right coronary artery. 3. Minimal atherosclerosis in the left circumflex system. 4. Patent saphenous vein graft to the right coronary artery with patent left    internal mammary graft to the LAD.  DISCUSSION:  It is very doubtful that the patients current symptoms and fatigue are related to progression of her coronary disease.  Will try to define other causes of these symptoms but hopefully anxiety will be relieved, and we can proceed on with evaluation and reassurance as needed. DD:  10/25/00 TD:  10/25/00 Job: [id number]

## 2010-10-29 NOTE — H&P (Signed)
[place]. [facility]  Patient:    [name] [name], [name] [name]                        MRN: [id number] Adm. Date:  10/25/00 Attending:  Ludwig Lean. Doreatha Lew, M.D. Dictator:   Marlane Hatcher. Maxwell Caul, R.N., A.N.P.                         History and Physical  CHIEF COMPLAINT:  Chest pain in the setting of marked fatigue.  HISTORY OF PRESENT ILLNESS:  [name] is a 65 year old white female who has known atherosclerotic cardiovascular disease.  She has had previous bypass surgery, as well as dyslipidemia and hypertension.  She presents to our office on Oct 18, 2000, as a work-in visit.  At that time, she was complaining of a ten day history of left upper chest discomfort.  It was basically above the left breast and did feel like a "pulled muscle".  It continued to linger, however, and it was dull and described somewhat as a steady ache.  It was worse with deep breathing.  She noted that this discomfort was clearly different from her previous chest pain syndrome, however, this discomfort now has continued to increase in severity.  She was treated with a short course of Mobic and then had a follow-up visit in the office on Oct 23, 2000.  At that time, she was noting that the chest pain that was located in the left breast region had improved, but she was continuing to have a discomfort in the mid back and was complaining of marked fatigue.  In light of ongoing symptomatology she is now referred on for electrocoronary angiography.  PAST MEDICAL HISTORY: 1. 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 Lanelle Bal on December 06, 1994. 2. Hypercholesterolemia. 3. Hormone replacement therapy. 4. Prior tobacco use. 5. Hypertension.  ALLERGIES:  None  known.  CURRENT MEDICATIONS: 1. Ecotrin 81 mg q.d. 2. Lipitor 20 mg b.i.d. 3. Asacol four tablets t.i.d. 4. Centrum Silver daily. 5. Vitamin E daily. 6. Mobic 7.5 mg daily.  FAMILY HISTORY:  So significant history of heart disease.  There is no history of diabetes or hypertension.  SOCIAL HISTORY:  She is married and has four children.  She has remote history of tobacco abuse.  There is no alcohol use.  REVIEW OF SYSTEMS:  Otherwise, as stated above.  PHYSICAL EXAMINATION:  GENERAL:  She is currently in no acute distress.  VITAL SIGNS:  Blood pressure 130/70 sitting, 130/80 standing.  Heart rate 68 and regular.  Respirations 18.  She is afebrile.  Weight 163 pounds.  Skin is warm and dry and color is unremarkable.  NECK:  Supple.  LUNGS:  Basically clear.  CARDIAC:  Regular rhythm.  There is no tenderness upon chest wall palpation.  ABDOMEN:  Soft, with positive bowel sounds.  Nontender.  EXTREMITIES:  No evidence of edema.  NEUROLOGIC:  Intact.  LABORATORY DATA:  EKG shows nonspecific changes.  Other laboratory data is pending.  IMPRESSION: 1. Continued episodes of chest and back discomfort of questionable etiology in    the setting of known atherosclerotic cardiovascular disease with previous    history  of bypass grafting.  She has had previous adenosine Cardiolite    study showing myocardial ischemia that was mild in nature dating back to    1998. 2. Hypertension. 3. Hypercholesterolemia.  PLAN:  We will proceed on with electrocoronary angiography.  The risks of the procedure and benefits have been explained and she is willing to proceed. DD:  10/24/00 TD:  10/24/00 Job: [id number]

## 2010-10-29 NOTE — H&P (Signed)
[name], [name]                         ACCOUNT NO.:  [id number]   MEDICAL RECORD NO.:  [id number]                   PATIENT TYPE:  INP   LOCATION:  NA                                   FACILITY[facility]   PHYSICIAN:  [name] Brood. Gladstone [name], M.D.             DATE OF BIRTH:  Feb 14, 1946   DATE OF ADMISSION:  01/25/2002  DATE OF DISCHARGE:                                HISTORY & PHYSICAL   CHIEF COMPLAINT:  Low back pain.   HISTORY OF PRESENT ILLNESS:  The patient is a pleasant 65 year old female  who has had a three- to four-month history of low back pain, escalating over  the past one to two weeks.  She was seen and evaluated in the office by Dr.  Gladstone [name].  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  On exam in the office it was noted that she walked with a slow, steady  gait.  Straight leg raise was negative bilaterally.  She had pain with range  of motion to her lumbar spine.  The pain was exacerbated with forward  flexion.  Neurovascularly she was intact to bilateral lower extremities.  It  was felt she would benefit from undergoing a bilateral hemilaminectomy at L5-  S1.  The risks and benefits as well as the procedure were discussed with the  patient, and she elected to proceed.  She is in the progress of obtaining  cardiac clearance from her cardiologist, Dr. Romeo [name].   ALLERGIES:  No known drug allergies.   MEDICATIONS:  1. Asacol 400 mg 4 p.o. t.i.d.  2. Lipitor 40 mg 1 p.o. q.d.  3. Aspirin 81 mg 1 p.o. q.d.  4. Multivitamin 1 p.o. q.d.  5. Vioxx 25 mg 1 p.o. q.d.  6. Darvocet-N 100 1 p.o. q.4-6h. p.r.n. pain.   PAST MEDICAL HISTORY:  1. Irritable bowel syndrome, under good control.  2. History of coronary artery disease, with a myocardial infarction in 1996.     Underwent a coronary artery bypass graft x 2 in 1996.  3. She is currently going through  menopause.  4. Degenerative disk disease.   SOCIAL HISTORY:  The patient is married.  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  She denies any alcohol or tobacco use.  She has  four children.  She has a husband to whom she has been married for two  years.  She lives in a one-level home.   PAST SURGICAL HISTORY:  1. Lateral mass fusion 23 years ago.  2. Coronary artery bypass graft 1996.   FAMILY HISTORY:  Mother deceased, age 61, history of myocardial infarction.  She has a sister living at 75, history of MI and breast cancer.   REVIEW OF SYSTEMS:  GENERAL:  Positive for hot flashes.  No fevers  or  chills.  PULMONARY:  Occasional cough with postnasal drainage.  No dyspnea  on exertion or orthopnea.  CARDIOVASCULAR:  No recent chest pain or angina.  GENITOURINARY:  No hematuria, dysuria, or discharge.  GASTROINTESTINAL:  Positive for constipation, on pain medications.  No diarrhea.  No nausea or  vomiting.  PSYCHIATRIC:  No history of depression or anxiety.   PHYSICAL EXAMINATION:  GENERAL:  Alert and oriented.  Well-developed, well-  nourished, pleasant 65 year old female.  VITAL SIGNS:  Pulse 70, respirations 9, blood pressure 140/85.  HEENT:  Head is atraumatic, normocephalic.  Oropharynx is clear.  NECK:  Supple.  Negative for cervical lymphadenopathy.  No jugular venous  distention noted.  CHEST:  Clear to auscultation bilaterally.  No wheezes, rhonchi, or rales.  BREASTS:  Not pertinent to present illness.  HEART:  S1, S2.  Well-healed midline scar to the anterior chest wall.  ABDOMEN:  Soft and nontender.  Positive bowel sounds.  GENITOURINARY:  Not pertinent to present illness.  EXTREMITIES:  Please see history of present illness for physical exam to the  lumbar spine.  Skin is intact.  She has a well-healed midline scar to her  lumbar spine.   LABORATORY DATA:  Preoperative laboratories and x-rays are pending.   IMPRESSION:  Herniated nucleus pulposus  L5 through S1.   PLAN:  Bilateral hemilaminectomy L5 through S1, pending Cardiolite on January 22, 2002, and cardiac clearance from Dr. Romeo [name].      [name] [name].                       [name] [name], M.D.    JBB/MEDQ  D:  01/15/2002  T:  01/20/2002  Job:  [id number]

## 2010-10-29 NOTE — Assessment & Plan Note (Signed)
[place] OFFICE NOTE   AIMAR, SHREWSBURY                      MRN:          [id number]  DATE:01/03/2006                            DOB:          14-May-1946    Ms. Justo returns for follow-up of left-sided ulcerative colitis.  Recent  flare subsided while on prednisone.  She finished her prednisone taper a few  days ago, and she has had no symptoms of ulcerative colitis now for about 2-  3 weeks.  She feels well.  I reviewed her pathology report with her.   Medications listed on the chart have been reviewed.   MEDICATION ALLERGIES:  None known.   PHYSICAL EXAMINATION:  GENERAL:  In no acute distress.  VITAL SIGNS:  Weight 132.6 pounds, blood pressure is 106/54, pulse is 90 and  regular.  CHEST:  Clear to auscultation bilaterally.  CARDIAC:  Regular rate and rhythm without murmurs.  ABDOMEN:  Soft and nontender with normoactive bowel sounds.   ASSESSMENT AND PLAN:  1.  Left-sided ulcerative colitis.  Recent flare resolved.  Continue Asacol      at 1.2 g t.i.d.  Return office visit 6 months.  Recall colonoscopy 5      years.  2.  Adenomatous colon polyp.  Recall colonoscopy 5 years.                                   Pricilla Riffle. Dagoberto Ligas., MD, [facility]   MTS/MedQ  DD:  01/03/2006  DT:  01/03/2006  Job #:  [id number]

## 2010-10-29 NOTE — Op Note (Signed)
[name] [name], [name] [name]                        ACCOUNT NO.:  [id number]   MEDICAL RECORD NO.:  [id number]                   PATIENT TYPE:  INP   LOCATION:  X009                                 FACILITY:  [facility]   PHYSICIAN:  Kipp Brood. Gladstone Lighter, M.D.             DATE OF BIRTH:  [birth year]   DATE OF PROCEDURE:  01/25/2002  DATE OF DISCHARGE:                                 OPERATIVE REPORT   SURGEON:  Kipp Brood. Gladstone Lighter, M.D.   ASSISTANT:  Laurice Record. [place]ton, M.D.   PREOPERATIVE DIAGNOSES:  1. Previous fusion of L4-5 and L5-S1, central.  2. Severe spinal stenosis L4-5 L5-S1with lateral recess stenosis.  3. 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   POSTOPERATIVE DIAGNOSES:  1. Previous fusion of L4-5 and L5-S1, central.  2. Severe spinal stenosis L4-5 L5-S1with lateral recess stenosis.  3. 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   OPERATION:  1. I first had to take down the bone graft that was centrally located.  2. I carried out a bilateral complete decompressive lumbar laminectomy at     the lower two levels and L4-5, L5-S1.  3. Decompression of the lateral recesses and foraminotomies at both levels.  4. Microdiskectomy at L5-S1 on the right.   DESCRIPTION OF PROCEDURE:  Under general anesthesia, a routine orthopedic  prep and draping of the lower back was carried out.  She had 1 g of IV  Ancef.  At this time, two needles were placed in the back for localization  purposes.  X-ray was taken to verify our space.  Once this was done, an  incision was made over the L4-5, L5-S1 interspaces.  Bleeders identified and  cauterized.  We went down bilaterally and stripped the muscle from the  overlying bone.  She had a previous central bone graft.  We had to carefully  remove the bone graft from the dura and the overlying lateral recess  regions.  Once this was completed, we then went down and removed all the  scar tissue.  And then we went down and  carried out central decompressive  lumbar laminectomies at L4-5, L5-S1.  Note that this required a careful  extensive dissection.  We utilized the microscope because there was a marked  amount of scar tissue.  At no time did we have a dural leak.  We continued  to carefully dissect our way through to identify the dura and to identify  the nerve root.  We did use the microscope.  We then went down and  identified the S1 root on the right which was quite irritable to touch.  We  gently retracted it and went down to the L5-S1 interspace and did a  diskectomy.  We cleaned the disk out real well.  We decompressed the lateral  recesses.  We did a foraminotomy as well at this area.  Note, she had  right  leg symptoms only.  We thoroughly irrigated out the area.  We had good  decompression out of the root.  We were able to easily pass a hockey-stick  down in the root, both sides, and then we  loosely applied some thrombin-soaked Gelfoam and closed the wound in layers  in the usual fashion.  Note, I left the deep part of the wound open distal  and proximal for drainage purposes.  Sterile dressings were applied after  the wound was closed, and the patient left the operating room in  satisfactory condition.                                               Ronald A. Gladstone Lighter, M.D.    RAG/MEDQ  D:  01/25/2002  T:  01/28/2002  Job:  [id number]

## 2010-11-02 ENCOUNTER — Other Ambulatory Visit: Payer: Self-pay | Admitting: Cardiology

## 2010-11-02 MED ORDER — ROSUVASTATIN CALCIUM 20 MG PO TABS: 20.0000 mg | ORAL_TABLET | Freq: Every day | ORAL | Status: DC

## 2010-11-02 NOTE — Telephone Encounter (Signed)
Med refill

## 2010-11-02 NOTE — Telephone Encounter (Signed)
Husband called in needing a refill for her Crestor called into [facility] [phone]. I have pulled the chart.

## 2010-11-03 ENCOUNTER — Ambulatory Visit (AMBULATORY_SURGERY_CENTER): Payer: PRIVATE HEALTH INSURANCE | Admitting: *Deleted

## 2010-11-03 ENCOUNTER — Telehealth: Payer: Self-pay | Admitting: Gastroenterology

## 2010-11-03 VITALS — Ht 66.0 in | Wt 176.0 lb

## 2010-11-03 DIAGNOSIS — Z86010 Personal history of colonic polyps: Secondary | ICD-10-CM

## 2010-11-03 DIAGNOSIS — K5190 Ulcerative colitis, unspecified, without complications: Secondary | ICD-10-CM

## 2010-11-03 MED ORDER — PEG-KCL-NACL-NASULF-NA ASC-C 100 G PO SOLR: ORAL | Status: DC

## 2010-11-03 MED ORDER — MESALAMINE 800 MG PO TBEC: 1.0000 | DELAYED_RELEASE_TABLET | Freq: Three times a day (TID) | ORAL | Status: DC

## 2010-11-03 NOTE — Telephone Encounter (Signed)
Left a message for the patient that I have sent in a [facility] supply [facility] [address] to last until she has her colonoscopy and then he can discuss further refills bc she she not been seen in over a year.

## 2010-11-17 ENCOUNTER — Ambulatory Visit (AMBULATORY_SURGERY_CENTER): Payer: PRIVATE HEALTH INSURANCE | Admitting: Gastroenterology

## 2010-11-17 ENCOUNTER — Encounter: Payer: Self-pay | Admitting: Gastroenterology

## 2010-11-17 VITALS — BP 125/68 | HR 63 | Temp 98.2°F | Resp 20 | Ht 66.0 in | Wt 172.0 lb

## 2010-11-17 DIAGNOSIS — D126 Benign neoplasm of colon, unspecified: Secondary | ICD-10-CM

## 2010-11-17 DIAGNOSIS — K5150 Left sided colitis without complications: Secondary | ICD-10-CM

## 2010-11-17 DIAGNOSIS — Z86010 Personal history of colonic polyps: Secondary | ICD-10-CM

## 2010-11-17 DIAGNOSIS — K5190 Ulcerative colitis, unspecified, without complications: Secondary | ICD-10-CM

## 2010-11-17 DIAGNOSIS — Z1211 Encounter for screening for malignant neoplasm of colon: Secondary | ICD-10-CM

## 2010-11-17 MED ORDER — SODIUM CHLORIDE 0.9 % IV SOLN: 500.0000 mL | INTRAVENOUS | Status: DC

## 2010-11-17 NOTE — Patient Instructions (Signed)
Findings: Polyp, mild colitis  Recommendations:  Repeat colonoscopy in 2 years depending on path results.                                   Continue current medications  Teaching and discharge instructions explained and given to care partner.

## 2010-11-18 ENCOUNTER — Telehealth: Payer: Self-pay | Admitting: Gastroenterology

## 2010-11-18 ENCOUNTER — Telehealth: Payer: Self-pay | Admitting: *Deleted

## 2010-11-18 NOTE — Telephone Encounter (Signed)
Line busy. No answer. °

## 2010-11-18 NOTE — Telephone Encounter (Signed)
Pt states she has a reddened area on her left flank area, that "looks like a scratch."  Pt doesn't remember "how it happened." Spoke with room nurse, A. Willett RN, who states that "a lot of abdominal pressure was applied" and she noted the reddened area before the procedure began and noted the area after as well.  Spoke with pt again, and explained the above.  Pt states area is "just a little uncomfortable and feels like a skinned knee."  Pt told to keep area clean and she states she has been applying Neosporin as needed.  Pt told to call if she has any further questions and understanding voiced.

## 2010-11-22 ENCOUNTER — Encounter: Payer: Self-pay | Admitting: Gastroenterology

## 2011-01-26 ENCOUNTER — Ambulatory Visit: Payer: PRIVATE HEALTH INSURANCE | Admitting: Cardiovascular Disease

## 2011-02-08 ENCOUNTER — Encounter: Payer: Self-pay | Admitting: Cardiovascular Disease

## 2011-02-09 ENCOUNTER — Ambulatory Visit (INDEPENDENT_AMBULATORY_CARE_PROVIDER_SITE_OTHER): Payer: PRIVATE HEALTH INSURANCE | Admitting: Cardiovascular Disease

## 2011-02-09 ENCOUNTER — Encounter: Payer: Self-pay | Admitting: Cardiovascular Disease

## 2011-02-09 VITALS — BP 141/74 | HR 71 | Ht 66.0 in | Wt 178.8 lb

## 2011-02-09 DIAGNOSIS — I2510 Atherosclerotic heart disease of native coronary artery without angina pectoris: Secondary | ICD-10-CM | POA: Insufficient documentation

## 2011-02-09 DIAGNOSIS — I10 Essential (primary) hypertension: Secondary | ICD-10-CM | POA: Insufficient documentation

## 2011-02-09 NOTE — Assessment & Plan Note (Signed)
BP has been well controlled. No changes.

## 2011-02-09 NOTE — Assessment & Plan Note (Signed)
Stable. No chest pain. Continue medical therapy. Will get lipids at next visit.

## 2011-02-09 NOTE — Progress Notes (Signed)
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 She has been followed in the past by Dr. Deborah Chalk and was most recently seen in [facility] in February 2012. Last stress test March 2010 with no ischemia, EF of 68%. Her last catheterization was in 2002.   She tells me today that she is feeling well. No chest pain or SOB. She has been active. She gets fatigued easily in the heat. No dizziness, near syncope or syncope. She has been tolerating her medications well.   Past Medical History  Diagnosis Date  . Arthritis   . Hyperlipidemia   . Hypertension   . Ulcerative colitis   . CAD (coronary artery disease)     2V CABG 1996 (LIMA to LAD, SVG to RCA) Dr. Tyrone Sage.    Past Surgical History  Procedure Date  . Colonoscopy   . Polypectomy   . Upper gastrointestinal endoscopy   . Coronary artery bypass graft 1996    2  . Back surgery     lumbar fusion 25 years ago and ruptured disk 10 years ago same area    Current Outpatient Prescriptions  Medication Sig Dispense Refill  . aspirin 81 MG tablet Take 81 mg by mouth daily.        . cholecalciferol (VITAMIN D) 1000 UNITS tablet Take 1,000 Units by mouth daily.        Marland Kitchen DIOVAN HCT 160-25 MG per tablet Take 1 tablet by mouth Daily.      . Mesalamine (ASACOL HD) 800 MG TBEC Take 1 tablet (800 mg total) by mouth 3 (three) times daily.  90 tablet  0  . Multiple Vitamins-Minerals (MULTIVITAMIN WITH MINERALS) tablet Take 1 tablet by mouth daily.        . rosuvastatin (CRESTOR) 20 MG tablet Take 1 tablet (20 mg total) by mouth at bedtime.  90 tablet  3   Current Facility-Administered Medications  Medication Dose Route Frequency Provider Last Rate Last Dose  . 0.9 %  sodium chloride infusion  500 mL Intravenous Continuous Meryl Dare, MD,FACG        No Known Allergies  History    Social History  . Marital Status: Married    Spouse Name: N/A    Number of Children: N/A  . Years of Education: N/A   Occupational History  . Not on file.   Social History Main Topics  . Smoking status: Former Smoker    Quit date: 10/04/1994  . Smokeless tobacco: Never Used  . Alcohol Use: No  . Drug Use: No  . Sexually Active: Not on file   Other Topics Concern  . Not on file   Social History Narrative  . No narrative on file    Family History  Problem Relation Age of Onset  . Lung cancer Brother   . Breast cancer Sister   . Kidney disease Brother     renal cancer  . Diabetes Mother   . Heart disease Mother   . Heart disease Sister     Review of Systems:  As stated in the HPI and otherwise negative.   BP 141/74  Pulse 71  Ht 5\' 6"  (1.676 m)  Wt 178 lb 12.8 oz (81.103 kg)  BMI 28.86 kg/m2  Physical Examination: General: Well developed, well nourished, NAD HEENT: OP clear, mucus membranes moist SKIN: warm, dry.  No rashes. Neuro: No focal deficits Musculoskeletal: Muscle strength 5/5 all ext Psychiatric: Mood and affect normal Neck: No JVD, no carotid bruits, no thyromegaly, no lymphadenopathy. Lungs:Clear bilaterally, no wheezes, rhonci, crackles Cardiovascular: Regular rate and rhythm. No murmurs, gallops or rubs. Abdomen:Soft. Bowel sounds present. Non-tender.  Extremities: No lower extremity edema. Pulses are 2 + in the bilateral DP/PT.  EKG:NSR, rate 71 bpm. Previous septal infarct.

## 2011-02-09 NOTE — Patient Instructions (Signed)
Your physician recommends that you schedule a follow-up appointment in: 6 months  

## 2011-05-10 ENCOUNTER — Other Ambulatory Visit: Payer: Self-pay | Admitting: Gastroenterology

## 2011-06-03 ENCOUNTER — Telehealth: Payer: Self-pay | Admitting: Gastroenterology

## 2011-06-03 MED ORDER — MESALAMINE 800 MG PO TBEC: 1.0000 | DELAYED_RELEASE_TABLET | Freq: Three times a day (TID) | ORAL | Status: DC

## 2011-06-03 NOTE — Telephone Encounter (Signed)
Patient needs 90 day supply of [facility].  She is advised it is completed

## 2011-06-03 NOTE — Telephone Encounter (Signed)
I have left a message for the patient to call back to clarify where she wants rx sent and what drug

## 2011-08-09 ENCOUNTER — Other Ambulatory Visit: Payer: Self-pay | Admitting: *Deleted

## 2011-08-09 ENCOUNTER — Ambulatory Visit (INDEPENDENT_AMBULATORY_CARE_PROVIDER_SITE_OTHER): Payer: Self-pay | Admitting: Cardiovascular Disease

## 2011-08-09 ENCOUNTER — Encounter: Payer: Self-pay | Admitting: Cardiovascular Disease

## 2011-08-09 VITALS — BP 134/70 | HR 66 | Ht 66.0 in | Wt 175.0 lb

## 2011-08-09 DIAGNOSIS — I10 Essential (primary) hypertension: Secondary | ICD-10-CM

## 2011-08-09 DIAGNOSIS — I2510 Atherosclerotic heart disease of native coronary artery without angina pectoris: Secondary | ICD-10-CM

## 2011-08-09 LAB — HEPATIC FUNCTION PANEL
ALT: 18 U/L (ref 0–35)
AST: 22 U/L (ref 0–37)
Albumin: 4.2 g/dL (ref 3.5–5.2)
Alkaline Phosphatase: 62 U/L (ref 39–117)
Bilirubin, Direct: 0.2 mg/dL (ref 0.0–0.3)
Total Bilirubin: 1.2 mg/dL (ref 0.3–1.2)
Total Protein: 7.1 g/dL (ref 6.0–8.3)

## 2011-08-09 LAB — LIPID PANEL
Cholesterol: 117 mg/dL (ref 0–200)
HDL: 52 mg/dL (ref >39.00)
LDL Cholesterol: 50 mg/dL (ref 0–99)
Total CHOL/HDL Ratio: 2
Triglycerides: 73 mg/dL (ref 0.0–149.0)
VLDL: 14.6 mg/dL (ref 0.0–40.0)

## 2011-08-09 MED ORDER — SIMVASTATIN 20 MG PO TABS: 20.0000 mg | ORAL_TABLET | Freq: Every evening | ORAL | Status: DC

## 2011-08-09 NOTE — Patient Instructions (Addendum)
Your physician wants you to follow-up in: 6 months. You will receive a reminder letter in the mail two months in advance. If you don't receive a letter, please call our office to schedule the follow-up appointment.  We will call you with the results of lab work done today and let you know about cholesterol medicine at that time.

## 2011-08-09 NOTE — Assessment & Plan Note (Signed)
Stable. Will continue ASA, statin. We will check lipids and LFTs today and will make the change to a generic statin, dosage based on current control of her lipids. BP is at goal. Continue ARB/HCTZ combination pill.

## 2011-08-09 NOTE — Progress Notes (Signed)
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 She has been followed in the past by Dr. Doreatha Lew. I saw her for the first time in August 2012.  Last stress test March 2010 with no ischemia, EF of 68%. Her last catheterization was in 2002.   She tells me today that she is feeling well. No chest pain or SOB. She has been active. She was recently started on Prozac for symptoms of depression and is doing much better. No dizziness, near syncope or syncope. She has been tolerating her medications well. Her insurance company does not want to pay for Crestor and she is asking for a generic medication.   Primary Care Physician: [name]  Last Lipid Profile: Checked in [place] in 2011. Due now.   Past Medical History  Diagnosis Date  . Arthritis   . Hyperlipidemia   . Hypertension   . Ulcerative colitis   . CAD (coronary artery disease)     2V CABG 1996 (LIMA to LAD, SVG to RCA) Dr. Servando Snare.    Past Surgical History  Procedure Date  . Colonoscopy   . Polypectomy   . Upper gastrointestinal endoscopy   . Coronary artery bypass graft 1996    2  . Back surgery     lumbar fusion 25 years ago and ruptured disk 10 years ago same area    Current Outpatient Prescriptions  Medication Sig Dispense Refill  . aspirin 81 MG tablet Take 81 mg by mouth daily.        . cholecalciferol (VITAMIN D) 1000 UNITS tablet Take 1,000 Units by mouth daily.        Marland Kitchen DIOVAN HCT 160-25 MG per tablet Take 1 tablet by mouth Daily.      Marland Kitchen FLUoxetine (PROZAC) 10 MG capsule Take 1 tablet by mouth Daily.      Marland Kitchen LORazepam (ATIVAN) 0.5 MG tablet prn      . Mesalamine (ASACOL HD) 800 MG TBEC Take 1 tablet (800 mg total) by mouth 3 (three) times daily.  270 tablet  3  . Multiple Vitamins-Minerals (MULTIVITAMIN WITH MINERALS) tablet Take 1 tablet by mouth daily.          . rosuvastatin (CRESTOR) 20 MG tablet Take 1 tablet (20 mg total) by mouth at bedtime.  90 tablet  3   Current Facility-Administered Medications  Medication Dose Route Frequency Provider Last Rate Last Dose  . 0.9 %  sodium chloride infusion  500 mL Intravenous Continuous Estanislado Emms., MD,FACG        No Known Allergies  History   Social History  . Marital Status: Married    Spouse Name: N/A    Number of Children: N/A  . Years of Education: N/A   Occupational History  . Not on file.   Social History Main Topics  . Smoking status: Former Smoker    Quit date: 10/04/1994  . Smokeless tobacco: Never Used  . Alcohol Use: No  . Drug Use: No  . Sexually Active: Not on file   Other Topics Concern  . Not on file   Social History Narrative  . No narrative on file    Family History  Problem Relation Age of Onset  . Lung cancer Brother   . Breast cancer Sister   . Kidney disease Brother  renal cancer  . Diabetes Mother   . Heart disease Mother   . Heart disease Sister   . Heart attack Mother 34    Review of Systems:  As stated in the HPI and otherwise negative.   BP 134/70  Pulse 66  Ht 5' 6"  (1.676 m)  Wt 175 lb (79.379 kg)  BMI 28.25 kg/m2  Physical Examination: General: Well developed, well nourished, NAD HEENT: OP clear, mucus membranes moist SKIN: warm, dry. No rashes. Neuro: No focal deficits Musculoskeletal: Muscle strength 5/5 all ext Psychiatric: Mood and affect normal Neck: No JVD, no carotid bruits, no thyromegaly, no lymphadenopathy. Lungs:Clear bilaterally, no wheezes, rhonci, crackles Cardiovascular: Regular rate and rhythm. No murmurs, gallops or rubs. Abdomen:Soft. Bowel sounds present. Non-tender.  Extremities: No lower extremity edema. Pulses are 2 + in the bilateral DP/PT.  EKG: NSR, rate 66 bpm. Poor R wave progression through the precordial leads.

## 2011-08-09 NOTE — Assessment & Plan Note (Signed)
BP well controlled. No changes.  

## 2011-09-12 ENCOUNTER — Other Ambulatory Visit: Payer: Self-pay | Admitting: Gastroenterology

## 2011-09-12 NOTE — Telephone Encounter (Signed)
Told patient to call her insurance company and find out what her preferred medications are in the place of Asacol since there is no generic medication for Asacol. Told her this would be better to find out instead sending in generics with trial and error.  Pt agreed and will call insurance company and call me back.

## 2011-09-12 NOTE — Telephone Encounter (Signed)
Pt called back and states her insurance companies preferred alternatives are balsalazide and eliphos. Told her that I will ask Dr. Fuller Plan and send the medication to her pharmacy tomorrow because Dr. Fuller Plan is not in the office today. Please advise Dr. Fuller Plan.

## 2011-09-12 NOTE — Telephone Encounter (Signed)
Balsalazide 750 mg 2 po tid, 6 months supply.

## 2011-09-13 MED ORDER — BALSALAZIDE DISODIUM 750 MG PO CAPS: ORAL_CAPSULE | ORAL | Status: DC

## 2011-09-13 NOTE — Telephone Encounter (Signed)
Left message for pt that rx has been sent and she can call back with questions.

## 2011-11-02 ENCOUNTER — Other Ambulatory Visit (INDEPENDENT_AMBULATORY_CARE_PROVIDER_SITE_OTHER): Payer: BC Managed Care – PPO

## 2011-11-02 DIAGNOSIS — I2510 Atherosclerotic heart disease of native coronary artery without angina pectoris: Secondary | ICD-10-CM

## 2011-11-02 LAB — HEPATIC FUNCTION PANEL
ALT: 18 U/L (ref 0–35)
AST: 21 U/L (ref 0–37)
Albumin: 4 g/dL (ref 3.5–5.2)
Alkaline Phosphatase: 57 U/L (ref 39–117)
Bilirubin, Direct: 0.2 mg/dL (ref 0.0–0.3)
Total Bilirubin: 1 mg/dL (ref 0.3–1.2)
Total Protein: 7 g/dL (ref 6.0–8.3)

## 2011-11-02 LAB — LIPID PANEL
Cholesterol: 142 mg/dL (ref 0–200)
HDL: 51.1 mg/dL (ref >39.00)
LDL Cholesterol: 81 mg/dL (ref 0–99)
Total CHOL/HDL Ratio: 3
Triglycerides: 50 mg/dL (ref 0.0–149.0)
VLDL: 10 mg/dL (ref 0.0–40.0)

## 2011-11-09 ENCOUNTER — Telehealth: Payer: Self-pay | Admitting: Cardiovascular Disease

## 2011-11-09 NOTE — Telephone Encounter (Signed)
Fu call °Patient returning your call °

## 2011-11-09 NOTE — Telephone Encounter (Signed)
Left message to call back  

## 2011-11-09 NOTE — Telephone Encounter (Signed)
Spoke with pt and confirmed she is taking simvastatin 20 mg daily. Will review labs with Dr. Angelena Form

## 2011-11-09 NOTE — Telephone Encounter (Signed)
Spoke with pt and gave her instructions from Dr. Angelena Form to continue simvastatin 20 mg daily

## 2011-11-29 ENCOUNTER — Other Ambulatory Visit: Payer: Self-pay | Admitting: Cardiology

## 2011-12-08 ENCOUNTER — Telehealth: Payer: Self-pay | Admitting: Cardiovascular Disease

## 2011-12-08 NOTE — Telephone Encounter (Signed)
Patient called stated she checks her blood pressure and has noticed since this past Monday 12/05/11 blood pressure elevated and heart rate elevated.States heart rate can range between 96 and 110 beats/min.B/P -164/77.Also stated has had jaw pain and has been to dentist and he gave her a mouth guard and it is not helping.States no chest pain, has been more tired.   Appointment scheduled with Truitt Merle NP tomorrow 12/09/11.

## 2011-12-08 NOTE — Telephone Encounter (Signed)
New msg Pt called to say that her heart rate has been from 110 to 91. BP 164/77 She wants to discuss.

## 2011-12-09 ENCOUNTER — Ambulatory Visit: Payer: BC Managed Care – PPO | Admitting: Nurse Practitioner

## 2011-12-12 ENCOUNTER — Encounter: Payer: Self-pay | Admitting: Nurse Practitioner

## 2011-12-12 ENCOUNTER — Other Ambulatory Visit: Payer: Self-pay | Admitting: Cardiovascular Disease

## 2011-12-12 ENCOUNTER — Ambulatory Visit (INDEPENDENT_AMBULATORY_CARE_PROVIDER_SITE_OTHER): Payer: BC Managed Care – PPO | Admitting: Nurse Practitioner

## 2011-12-12 VITALS — BP 120/78 | HR 96 | Ht 66.0 in | Wt 172.0 lb

## 2011-12-12 DIAGNOSIS — I10 Essential (primary) hypertension: Secondary | ICD-10-CM

## 2011-12-12 DIAGNOSIS — I2510 Atherosclerotic heart disease of native coronary artery without angina pectoris: Secondary | ICD-10-CM

## 2011-12-12 DIAGNOSIS — R000 Tachycardia, unspecified: Secondary | ICD-10-CM

## 2011-12-12 LAB — CBC WITH DIFFERENTIAL/PLATELET
Basophils Absolute: 0 10*3/uL (ref 0.0–0.1)
Basophils Relative: 0.5 % (ref 0.0–3.0)
Eosinophils Absolute: 0 10*3/uL (ref 0.0–0.7)
Eosinophils Relative: 0.7 % (ref 0.0–5.0)
HCT: 38.8 % (ref 36.0–46.0)
Hemoglobin: 12.9 g/dL (ref 12.0–15.0)
Lymphocytes Relative: 34.6 % (ref 12.0–46.0)
Lymphs Abs: 2.3 10*3/uL (ref 0.7–4.0)
MCHC: 33.3 g/dL (ref 30.0–36.0)
MCV: 93 fl (ref 78.0–100.0)
Monocytes Absolute: 0.3 10*3/uL (ref 0.1–1.0)
Monocytes Relative: 4 % (ref 3.0–12.0)
Neutro Abs: 4.1 10*3/uL (ref 1.4–7.7)
Neutrophils Relative %: 60.2 % (ref 43.0–77.0)
Platelets: 252 10*3/uL (ref 150.0–400.0)
RBC: 4.17 Mil/uL (ref 3.87–5.11)
RDW: 13.9 % (ref 11.5–14.6)
WBC: 6.7 10*3/uL (ref 4.5–10.5)

## 2011-12-12 LAB — TSH: TSH: 0.4 u[IU]/mL (ref 0.35–5.50)

## 2011-12-12 LAB — BASIC METABOLIC PANEL
BUN: 16 mg/dL (ref 6–23)
CO2: 31 mEq/L (ref 19–32)
Calcium: 9.6 mg/dL (ref 8.4–10.5)
Chloride: 100 mEq/L (ref 96–112)
Creatinine, Ser: 0.8 mg/dL (ref 0.4–1.2)
GFR: 79.85 mL/min (ref >60.00)
Glucose, Bld: 98 mg/dL (ref 70–99)
Potassium: 3.7 mEq/L (ref 3.5–5.1)
Sodium: 139 mEq/L (ref 135–145)

## 2011-12-12 MED ORDER — CARVEDILOL 3.125 MG PO TABS: 3.1250 mg | ORAL_TABLET | Freq: Two times a day (BID) | ORAL | Status: DC

## 2011-12-12 NOTE — Progress Notes (Signed)
[name] [name] Date of Birth: Jun 10, 1946 Medical Record #[id number]  History of Present Illness: [name] [name] is seen today for a work in visit. She is seen for Dr. Angelena Form. She has known CAD with remote 2 vessel CABG following anterior MI with emergent angioplasty back in 1996. She has a LIMA to the LAD and SVG to the RCA. Last cath was in 2002. Last stress test in 2010 showing no ischemia and a normal EF.  Other problems include HLD, claudication, depression and ulcerative colitis.   She comes in today. She is here alone. She has noted for the past few weeks that her heart rate and blood pressure are higher than what she feels like they should be. Normally her heart rate is in the 60's but now she can wake up in the am and it is 114. No actual palpitations. She only notes it because of the reading on her BP machine. She is fatigued and this may be worse. It is not to the level that she had prior to her CABG. She does not have chest pain. 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 This is not exertional related. She was given a mouth guard and it may have helped her some. She does have some stress but nothing really any different than what she is accustomed to. She is quite concerned that something else may be going on with her. She never had chest pain prior to her CABG.   Current Outpatient Prescriptions on File Prior to Visit  Medication Sig Dispense Refill  . aspirin 81 MG tablet Take 81 mg by mouth daily.        . balsalazide (COLAZAL) 750 MG capsule Take two tablet by mouth three times a day  180 capsule  5  . cholecalciferol (VITAMIN D) 1000 UNITS tablet Take 1,000 Units by mouth daily.        Marland Kitchen DIOVAN HCT 160-25 MG per tablet Take 1 tablet by mouth Daily.      Marland Kitchen FLUoxetine (PROZAC) 10 MG capsule Take 1 tablet by mouth Daily.      Marland Kitchen LORazepam (ATIVAN) 0.5 MG tablet prn      . Multiple Vitamins-Minerals (MULTIVITAMIN WITH MINERALS) tablet Take 1 tablet by  mouth daily.        . simvastatin (ZOCOR) 20 MG tablet Take 1 tablet (20 mg total) by mouth every evening.  90 tablet  3  . carvedilol (COREG) 3.125 MG tablet Take 1 tablet (3.125 mg total) by mouth 2 (two) times daily.  60 tablet  11   Current Facility-Administered Medications on File Prior to Visit  Medication Dose Route Frequency Provider Last Rate Last Dose  . 0.9 %  sodium chloride infusion  500 mL Intravenous Continuous Ladene Artist, MD,FACG        No Known Allergies  Past Medical History  Diagnosis Date  . Arthritis   . Hyperlipidemia   . Hypertension   . Ulcerative colitis   . CAD (coronary artery disease)     2V CABG 1996 (LIMA to LAD, SVG to RCA) Dr. Servando Snare.; last stress test in 2010    Past Surgical History  Procedure Date  . Colonoscopy   . Polypectomy   . Upper gastrointestinal endoscopy   . Coronary artery bypass graft 1996    x 2  . Back surgery     lumbar fusion 25 years ago and ruptured disk 10 years ago same area  History  Smoking status  . Former Smoker  . Quit date: 10/04/1994  Smokeless tobacco  . Never Used    History  Alcohol Use No    Family History  Problem Relation Age of Onset  . Lung cancer Brother   . Breast cancer Sister   . Kidney disease Brother     renal cancer  . Diabetes Mother   . Heart disease Mother   . Heart disease Sister   . Heart attack Mother 51    Review of Systems: The review of systems is per the HPI.  All other systems were reviewed and are negative.  Physical Exam: BP 120/78  Pulse 96  Ht 5' 6"  (1.676 m)  Wt 172 lb (78.019 kg)  BMI 27.76 kg/m2 Patient is very pleasant and in no acute distress. Skin is warm and dry. Color is normal.  HEENT is unremarkable. Normocephalic/atraumatic. PERRL. Sclera are nonicteric. Neck is supple. No masses. No JVD. Lungs are clear. Cardiac exam shows a regular rate and rhythm. Her rate is a little faster than normal for her. Abdomen is obese but soft. Extremities are  without edema. Gait and ROM are intact. No gross neurologic deficits noted.  LABORATORY DATA: EKG shows sinus rhythm. Rate is up to 91. Septal Q's noted.   Assessment / Plan:

## 2011-12-12 NOTE — Assessment & Plan Note (Signed)
Patient presents with increasing fatigue and notes higher BP and heart rates than normal. Has known CAD with remote CABG. Her last stress test was over 3 years ago. We are going to repeat her Lexiscan. I have added Coreg 3.125 mg BID to her regimen. She will continue to monitor at home. Will tentatively see her back as planned in August, but sooner if her study would dictate otherwise. Patient is agreeable to this plan and will call if any problems develop in the interim.

## 2011-12-12 NOTE — Patient Instructions (Addendum)
We are going to check some labs today  I am going to put you on Coreg 3.125 mg two times a day. This helps with your heart rate and your blood pressure  We are going to arrange for a stress test [name] [name])  Keep your appointment in August with Dr. Angelena Form.   Call the [facility] office at [phone] if you have any questions, problems or concerns.

## 2011-12-26 ENCOUNTER — Ambulatory Visit (HOSPITAL_COMMUNITY): Payer: BC Managed Care – PPO | Attending: Cardiology | Admitting: Radiology

## 2011-12-26 VITALS — BP 121/73 | Ht 66.0 in | Wt 177.0 lb

## 2011-12-26 DIAGNOSIS — R079 Chest pain, unspecified: Secondary | ICD-10-CM

## 2011-12-26 DIAGNOSIS — R0989 Other specified symptoms and signs involving the circulatory and respiratory systems: Secondary | ICD-10-CM | POA: Insufficient documentation

## 2011-12-26 DIAGNOSIS — R0609 Other forms of dyspnea: Secondary | ICD-10-CM | POA: Insufficient documentation

## 2011-12-26 DIAGNOSIS — Z8249 Family history of ischemic heart disease and other diseases of the circulatory system: Secondary | ICD-10-CM | POA: Insufficient documentation

## 2011-12-26 DIAGNOSIS — I739 Peripheral vascular disease, unspecified: Secondary | ICD-10-CM | POA: Insufficient documentation

## 2011-12-26 DIAGNOSIS — R61 Generalized hyperhidrosis: Secondary | ICD-10-CM | POA: Insufficient documentation

## 2011-12-26 DIAGNOSIS — E785 Hyperlipidemia, unspecified: Secondary | ICD-10-CM | POA: Insufficient documentation

## 2011-12-26 DIAGNOSIS — Z87891 Personal history of nicotine dependence: Secondary | ICD-10-CM | POA: Insufficient documentation

## 2011-12-26 DIAGNOSIS — Z951 Presence of aortocoronary bypass graft: Secondary | ICD-10-CM | POA: Insufficient documentation

## 2011-12-26 DIAGNOSIS — R000 Tachycardia, unspecified: Secondary | ICD-10-CM

## 2011-12-26 DIAGNOSIS — I10 Essential (primary) hypertension: Secondary | ICD-10-CM

## 2011-12-26 DIAGNOSIS — I2510 Atherosclerotic heart disease of native coronary artery without angina pectoris: Secondary | ICD-10-CM

## 2011-12-26 DIAGNOSIS — I252 Old myocardial infarction: Secondary | ICD-10-CM | POA: Insufficient documentation

## 2011-12-26 MED ORDER — TECHNETIUM TC 99M TETROFOSMIN IV KIT
10.0000 | PACK | Freq: Once | INTRAVENOUS | Status: AC | PRN
  Administered 2011-12-26: 10 via INTRAVENOUS

## 2011-12-26 MED ORDER — REGADENOSON 0.4 MG/5ML IV SOLN
0.4000 mg | Freq: Once | INTRAVENOUS | Status: AC
  Administered 2011-12-26: 0.4 mg via INTRAVENOUS

## 2011-12-26 MED ORDER — TECHNETIUM TC 99M TETROFOSMIN IV KIT
30.0000 | PACK | Freq: Once | INTRAVENOUS | Status: AC | PRN
  Administered 2011-12-26: 30 via INTRAVENOUS

## 2011-12-26 NOTE — Progress Notes (Signed)
[place] [address] [phone]  Cardiology Nuclear Med Study  [name] [name] is a 66 y.o. female     MRN : [id number]     DOB: 1945/07/17  Procedure Date: 12/26/2011  Nuclear Med Background Indication for Stress Test:  Evaluation for Ischemia, Graft Patency and PTCA Patency History:  96 'Angioplasty;96'CABG x2; 02'Heart Catheterization ,96' Myocardial Infarction,3/10' Myocardial Perfusion Study Cardiac Risk Factors: Claudication, Family History - CAD, History of Smoking, Hypertension and Lipids  Symptoms:  Diaphoresis, Fatigue and Rapid HR   Nuclear Pre-Procedure Caffeine/Decaff Intake:  None NPO After: 7:30pm   Lungs:  clear O2 Sat: 97% on room air. IV 0.9% NS with Angio Cath:  22g  IV Site: R Antecubital  IV Started by:  Irven Baltimore, RN  Chest Size (in):  38 Cup Size: D  Height: 5' 6"  (1.676 m)  Weight:  177 lb (80.287 kg)  BMI:  Body mass index is 28.57 kg/(m^2). Tech Comments:  Coreg taken this am    Nuclear Med Study 1 or 2 day study: 1 day  Stress Test Type:  Treadmill/Lexiscan  Reading MD: Loralie Champagne, MD  Order Authorizing Provider:  Gennette Pac  Resting Radionuclide: Technetium 59mTetrofosmin  Resting Radionuclide Dose: 10.1 mCi   Stress Radionuclide:  Technetium 979metrofosmin  Stress Radionuclide Dose: 32.0 mCi           Stress Protocol Rest HR: 70 Stress HR: 106  Rest BP: 121/73 Stress BP: 133/67  Exercise Time (min): n/a METS: n/a   Predicted Max HR: 155 bpm % Max HR: 68.39 bpm Rate Pressure Product: 14098   Dose of Adenosine (mg):  n/a Dose of Lexiscan: 0.4 mg  Dose of Atropine (mg): n/a Dose of Dobutamine: n/a mcg/kg/min (at max HR)  Stress Test Technologist: TeIleene HutchinsonEMT-P  Nuclear Technologist:  StCharlton AmorCNMT     Rest Procedure:  Myocardial perfusion imaging was performed at rest 45 minutes following the intravenous administration of Technetium 9981metrofosmin. Rest ECG: NSR - Normal EKG  Stress Procedure:  The patient received IV Lexiscan 0.4 mg over 15-seconds with concurrent low level exercise and then Technetium 61m10mrofosmin was injected at 30-seconds while the patient continued walking one more minute. There were no significant changes with Lexiscan. Quantitative spect images were obtained after a 45-minute delay. Stress ECG: No significant change from baseline ECG  QPS Raw Data Images:  Normal; no motion artifact; normal heart/lung ratio. Stress Images:  Small, mild mid to apical anterior perfusion defect.  Rest Images:  Small, mild mid to apical anterior perfusion defect, defect less marked than stress images.  Subtraction (SDS):  Small, mild partially reversible mid to apical anterior perfusion defect.  Transient Ischemic Dilatation (Normal <1.22):  1.03 Lung/Heart Ratio (Normal <0.45):  0.34  Quantitative Gated Spect Images QGS EDV:  59 ml QGS ESV:  18 ml  Impression Exercise Capacity:  Lexiscan with no exercise. BP Response:  Normal blood pressure response. Clinical Symptoms:  Chest pressure.  ECG Impression:  No significant ST segment change suggestive of ischemia. Comparison with Prior Nuclear Study: No images to compare  Overall Impression:  Low risk study.  There is a small, mild mid to apical partially reversible anterior perfusion defect.  This may represent a small area of infarction with peri-infarct ischemia.  Given the how mild the defect is, this could be shifting breast attenuation alternatively.   LV Ejection Fraction: 69%.  LV  Wall Motion:  NL LV Function; NL Wall Motion  Loralie Champagne 12/26/2011    .

## 2012-01-12 ENCOUNTER — Other Ambulatory Visit: Payer: Self-pay | Admitting: *Deleted

## 2012-01-12 MED ORDER — CARVEDILOL 3.125 MG PO TABS: 3.1250 mg | ORAL_TABLET | Freq: Two times a day (BID) | ORAL | Status: DC

## 2012-02-07 ENCOUNTER — Ambulatory Visit (INDEPENDENT_AMBULATORY_CARE_PROVIDER_SITE_OTHER): Payer: BC Managed Care – PPO | Admitting: Cardiovascular Disease

## 2012-02-07 ENCOUNTER — Encounter: Payer: Self-pay | Admitting: Cardiovascular Disease

## 2012-02-07 VITALS — BP 122/72 | HR 70 | Resp 12 | Ht 66.0 in | Wt 176.0 lb

## 2012-02-07 DIAGNOSIS — I2510 Atherosclerotic heart disease of native coronary artery without angina pectoris: Secondary | ICD-10-CM

## 2012-02-07 MED ORDER — NITROGLYCERIN 0.4 MG SL SUBL: 0.4000 mg | SUBLINGUAL_TABLET | SUBLINGUAL | Status: DC | PRN

## 2012-02-07 NOTE — Patient Instructions (Signed)
Your physician wants you to follow-up in:  6 months. You will receive a reminder letter in the mail two months in advance. If you don't receive a letter, please call our office to schedule the follow-up appointment.  You have been referred to a nutritionist

## 2012-02-07 NOTE — Progress Notes (Signed)
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  She has been followed in the past by Dr. Doreatha Lew. Last stress test March 2010 with no ischemia, EF of 68%. Her last catheterization was in 2002. She was seen by Truitt Merle 12/12/11 with c/o tachycardia. Coreg was added and a stress myoview was arranged. This was a low risk study with probably shifting breast attenuation artifact.   She tells me today that she is feeling well. No chest pain or SOB. She has been active. HR well controlled on Coreg. No dizziness, near syncope or syncope. She has been tolerating her medications well.    Primary Care Physician: [name]  Last Lipid Profile:  Lipid Panel     Component Value Date/Time   CHOL 142 11/02/2011 0847   TRIG 50.0 11/02/2011 0847   HDL 51.10 11/02/2011 0847   CHOLHDL 3 11/02/2011 0847   VLDL 10.0 11/02/2011 0847   LDLCALC 81 11/02/2011 0847     Past Medical History  Diagnosis Date  . Arthritis   . Hyperlipidemia   . Hypertension   . Ulcerative colitis   . CAD (coronary artery disease)     2V CABG 1996 (LIMA to LAD, SVG to RCA) Dr. Servando Snare.; last stress test in 2010    Past Surgical History  Procedure Date  . Colonoscopy   . Polypectomy   . Upper gastrointestinal endoscopy   . Coronary artery bypass graft 1996    x 2  . Back surgery     lumbar fusion 25 years ago and ruptured disk 10 years ago same area    Current Outpatient Prescriptions  Medication Sig Dispense Refill  . aspirin 81 MG tablet Take 81 mg by mouth daily.        . balsalazide (COLAZAL) 750 MG capsule Take two tablet by mouth three times a day  180 capsule  5  . carvedilol (COREG) 3.125 MG tablet Take 1 tablet (3.125 mg total) by mouth 2 (two) times daily.  180 tablet  3  . cholecalciferol (VITAMIN D) 1000 UNITS tablet Take 1,000 Units by mouth  daily.        . cyclobenzaprine (FLEXERIL) 10 MG tablet as needed.       Marland Kitchen FLUoxetine (PROZAC) 10 MG capsule Take 1 tablet by mouth Daily.      Marland Kitchen LORazepam (ATIVAN) 0.5 MG tablet prn      . Multiple Vitamins-Minerals (MULTIVITAMIN WITH MINERALS) tablet Take 1 tablet by mouth daily.        . simvastatin (ZOCOR) 20 MG tablet Take 1 tablet (20 mg total) by mouth every evening.  90 tablet  3  . valsartan-hydrochlorothiazide (DIOVAN-HCT) 160-25 MG per tablet TAKE 1 TABLET BY MOUTH EVERY DAY  90 tablet  4   Current Facility-Administered Medications  Medication Dose Route Frequency Provider Last Rate Last Dose  . 0.9 %  sodium chloride infusion  500 mL Intravenous Continuous Ladene Artist, MD,FACG        No Known Allergies  History   Social History  . Marital Status: Married    Spouse Name: N/A    Number of Children: N/A  . Years of Education: N/A   Occupational History  . Not on file.   Social History Main Topics  . Smoking status: Former Smoker    Quit date: 10/04/1994  .  Smokeless tobacco: Never Used  . Alcohol Use: No  . Drug Use: No  . Sexually Active: Not Currently   Other Topics Concern  . Not on file   Social History Narrative  . No narrative on file    Family History  Problem Relation Age of Onset  . Lung cancer Brother   . Breast cancer Sister   . Kidney disease Brother     renal cancer  . Diabetes Mother   . Heart disease Mother   . Heart disease Sister   . Heart attack Mother 56    Review of Systems:  As stated in the HPI and otherwise negative.   BP 150/78  Ht 5' 6"  (1.676 m)  Wt 176 lb (79.833 kg)  BMI 28.41 kg/m2  Physical Examination: General: Well developed, well nourished, NAD HEENT: OP clear, mucus membranes moist SKIN: warm, dry. No rashes. Neuro: No focal deficits Musculoskeletal: Muscle strength 5/5 all ext Psychiatric: Mood and affect normal Neck: No JVD, no carotid bruits, no thyromegaly, no lymphadenopathy. Lungs:Clear  bilaterally, no wheezes, rhonci, crackles Cardiovascular: Regular rate and rhythm. No murmurs, gallops or rubs. Abdomen:Soft. Bowel sounds present. Non-tender.  Extremities: No lower extremity edema. Pulses are 2 + in the bilateral DP/PT.  Lexiscan myoview 12/26/11:  Stress Procedure: The patient received IV Lexiscan 0.4 mg over 15-seconds with concurrent low level exercise and then Technetium 80mTetrofosmin was injected at 30-seconds while the patient continued walking one more minute. There were no significant changes with Lexiscan. Quantitative spect images were obtained after a 45-minute delay.  Stress ECG: No significant change from baseline ECG  QPS  Raw Data Images: Normal; no motion artifact; normal heart/lung ratio.  Stress Images: Small, mild mid to apical anterior perfusion defect.  Rest Images: Small, mild mid to apical anterior perfusion defect, defect less marked than stress images.  Subtraction (SDS): Small, mild partially reversible mid to apical anterior perfusion defect.  Transient Ischemic Dilatation (Normal <1.22): 1.03  Lung/Heart Ratio (Normal <0.45): 0.34  Quantitative Gated Spect Images  QGS EDV: 59 ml  QGS ESV: 18 ml  Impression  Exercise Capacity: Lexiscan with no exercise.  BP Response: Normal blood pressure response.  Clinical Symptoms: Chest pressure.  ECG Impression: No significant ST segment change suggestive of ischemia.  Comparison with Prior Nuclear Study: No images to compare  Overall Impression: Low risk study. There is a small, mild mid to apical partially reversible anterior perfusion defect. This may represent a small area of infarction with peri-infarct ischemia. Given the how mild the defect is, this could be shifting breast attenuation alternatively.  LV Ejection Fraction: 69%. LV Wall Motion: NL LV Function; NL Wall Motion   Assessment and Plan:   1. CAD: Stable. No ischemia on stress myoview. LVEF 69%. BP is well controlled. Lipids are at  goal. She is asked to continue exercising.   2. HTN: BP is stable. No changes.

## 2012-02-08 ENCOUNTER — Telehealth: Payer: Self-pay | Admitting: *Deleted

## 2012-02-08 NOTE — Telephone Encounter (Signed)
I spoke to [facility] from Nutrition and Diabetes Management Department. [name] has tried to reach [name] [name] since yesterday. Patient has not returned her phone call. She has even left a message with patient husband. The referral will be place on their deferred list. If Mrs. Nawaz wishes to make an appointment she can call [phone] press 0.

## 2012-02-10 NOTE — Telephone Encounter (Signed)
Spoke with pt. She has number for nutrition center and will contact them to arrange appt.

## 2012-05-07 ENCOUNTER — Encounter: Payer: Self-pay | Admitting: Cardiovascular Disease

## 2012-08-02 ENCOUNTER — Ambulatory Visit (INDEPENDENT_AMBULATORY_CARE_PROVIDER_SITE_OTHER): Payer: BC Managed Care – PPO | Admitting: Cardiovascular Disease

## 2012-08-02 ENCOUNTER — Encounter: Payer: Self-pay | Admitting: Cardiovascular Disease

## 2012-08-02 VITALS — BP 130/76 | HR 70 | Ht 66.0 in | Wt 180.0 lb

## 2012-08-02 DIAGNOSIS — I10 Essential (primary) hypertension: Secondary | ICD-10-CM

## 2012-08-02 DIAGNOSIS — I2510 Atherosclerotic heart disease of native coronary artery without angina pectoris: Secondary | ICD-10-CM

## 2012-08-02 MED ORDER — NITROGLYCERIN 0.4 MG SL SUBL: 0.4000 mg | SUBLINGUAL_TABLET | SUBLINGUAL | Status: DC | PRN

## 2012-08-02 MED ORDER — CARVEDILOL 3.125 MG PO TABS: 3.1250 mg | ORAL_TABLET | Freq: Two times a day (BID) | ORAL | Status: DC

## 2012-08-02 MED ORDER — VALSARTAN-HYDROCHLOROTHIAZIDE 160-25 MG PO TABS: 1.0000 | ORAL_TABLET | Freq: Every day | ORAL | Status: DC

## 2012-08-02 MED ORDER — SIMVASTATIN 20 MG PO TABS: 20.0000 mg | ORAL_TABLET | Freq: Every evening | ORAL | Status: DC

## 2012-08-02 NOTE — Progress Notes (Signed)
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 She has been followed in the past by Dr. Doreatha Lew. Last stress test March 2010 with no ischemia, EF of 68%. Her last catheterization was in 2002. She was seen by Truitt Merle 12/12/11 with c/o tachycardia. Coreg was added and a stress myoview was arranged. This was a low risk study with probable shifting breast attenuation artifact. I last saw her 02/07/12.   She is here today for follow up. She tells me today that she is feeling well. No chest pain or SOB. She has been active. HR well controlled on Coreg. No dizziness, near syncope or syncope. She has been tolerating her medications well.   Primary Care Physician: [name]   Last Lipid Profile:Lipid Panel     Component Value Date/Time   CHOL 142 11/02/2011 0847   TRIG 50.0 11/02/2011 0847   HDL 51.10 11/02/2011 0847   CHOLHDL 3 11/02/2011 0847   VLDL 10.0 11/02/2011 0847   LDLCALC 81 11/02/2011 0847     Past Medical History  Diagnosis Date  . Arthritis   . Hyperlipidemia   . Hypertension   . Ulcerative colitis   . CAD (coronary artery disease)     2V CABG 1996 (LIMA to LAD, SVG to RCA) Dr. Servando Snare.; last stress test in 2010    Past Surgical History  Procedure Laterality Date  . Colonoscopy    . Polypectomy    . Upper gastrointestinal endoscopy    . Coronary artery bypass graft  1996    x 2  . Back surgery      lumbar fusion 25 years ago and ruptured disk 10 years ago same area    Current Outpatient Prescriptions  Medication Sig Dispense Refill  . aspirin 81 MG tablet Take 81 mg by mouth daily.        . balsalazide (COLAZAL) 750 MG capsule Take two tablet by mouth three times a day  180 capsule  5  . carvedilol (COREG) 3.125 MG tablet Take 1 tablet (3.125 mg total) by mouth 2 (two) times daily.  180 tablet  3  .  cholecalciferol (VITAMIN D) 1000 UNITS tablet Take 1,000 Units by mouth daily.        . cyclobenzaprine (FLEXERIL) 10 MG tablet as needed.       Marland Kitchen FLUoxetine (PROZAC) 10 MG capsule Take 1 tablet by mouth Daily.      Marland Kitchen LORazepam (ATIVAN) 0.5 MG tablet prn      . Multiple Vitamins-Minerals (MULTIVITAMIN WITH MINERALS) tablet Take 1 tablet by mouth daily.        . nitroGLYCERIN (NITROSTAT) 0.4 MG SL tablet Place 1 tablet (0.4 mg total) under the tongue every 5 (five) minutes as needed for chest pain.  25 tablet  6  . simvastatin (ZOCOR) 20 MG tablet Take 1 tablet (20 mg total) by mouth every evening.  90 tablet  3  . valsartan-hydrochlorothiazide (DIOVAN-HCT) 160-25 MG per tablet TAKE 1 TABLET BY MOUTH EVERY DAY  90 tablet  4   Current Facility-Administered Medications  Medication Dose Route Frequency Provider Last Rate Last Dose  . 0.9 %  sodium chloride infusion  500 mL Intravenous Continuous Ladene Artist, MD,FACG        No Known Allergies  History   Social History  . Marital Status: Married  Spouse Name: N/A    Number of Children: N/A  . Years of Education: N/A   Occupational History  . Not on file.   Social History Main Topics  . Smoking status: Former Smoker    Quit date: 10/04/1994  . Smokeless tobacco: Never Used  . Alcohol Use: No  . Drug Use: No  . Sexually Active: Not Currently   Other Topics Concern  . Not on file   Social History Narrative  . No narrative on file    Family History  Problem Relation Age of Onset  . Lung cancer Brother   . Breast cancer Sister   . Kidney disease Brother     renal cancer  . Diabetes Mother   . Heart disease Mother   . Heart disease Sister   . Heart attack Mother 55    Review of Systems:  As stated in the HPI and otherwise negative.   BP 130/76  Pulse 70  Ht 5' 6"  (1.676 m)  Wt 180 lb (81.647 kg)  BMI 29.07 kg/m2  Physical Examination: General: Well developed, well nourished, NAD HEENT: OP clear, mucus  membranes moist SKIN: warm, dry. No rashes. Neuro: No focal deficits Musculoskeletal: Muscle strength 5/5 all ext Psychiatric: Mood and affect normal Neck: No JVD, no carotid bruits, no thyromegaly, no lymphadenopathy. Lungs:Clear bilaterally, no wheezes, rhonci, crackles Cardiovascular: Regular rate and rhythm. No murmurs, gallops or rubs. Abdomen:Soft. Bowel sounds present. Non-tender.  Extremities: No lower extremity edema. Pulses are 2 + in the bilateral DP/PT.  Assessment and Plan:   1. CAD: Stable. No ischemia on stress myoview July 2013. LVEF 69%. BP is well controlled. Lipids are at goal. Will check lipids and LFTs in May 2014.   2. HTN: BP is stable. No changes.

## 2012-08-02 NOTE — Patient Instructions (Addendum)
Your physician wants you to follow-up in:  6 months.  You will receive a reminder letter in the mail two months in advance. If you don't receive a letter, please call our office to schedule the follow-up appointment.  Your physician recommends that you return for fasting lab work in:  May 2014--Lipid and Liver profile

## 2012-08-21 ENCOUNTER — Encounter: Payer: Self-pay | Admitting: Cardiovascular Disease

## 2012-08-22 ENCOUNTER — Other Ambulatory Visit: Payer: Self-pay | Admitting: Cardiovascular Disease

## 2012-10-16 ENCOUNTER — Other Ambulatory Visit (INDEPENDENT_AMBULATORY_CARE_PROVIDER_SITE_OTHER): Payer: Medicare Other

## 2012-10-16 DIAGNOSIS — I2510 Atherosclerotic heart disease of native coronary artery without angina pectoris: Secondary | ICD-10-CM

## 2012-10-16 LAB — HEPATIC FUNCTION PANEL
ALT: 17 U/L (ref 0–35)
AST: 23 U/L (ref 0–37)
Albumin: 4.1 g/dL (ref 3.5–5.2)
Alkaline Phosphatase: 58 U/L (ref 39–117)
Bilirubin, Direct: 0.2 mg/dL (ref 0.0–0.3)
Total Bilirubin: 1.3 mg/dL — ABNORMAL HIGH (ref 0.3–1.2)
Total Protein: 7 g/dL (ref 6.0–8.3)

## 2012-10-16 LAB — LIPID PANEL
Cholesterol: 158 mg/dL (ref 0–200)
HDL: 49.9 mg/dL (ref >39.00)
LDL Cholesterol: 90 mg/dL (ref 0–99)
Total CHOL/HDL Ratio: 3
Triglycerides: 90 mg/dL (ref 0.0–149.0)
VLDL: 18 mg/dL (ref 0.0–40.0)

## 2012-12-03 ENCOUNTER — Other Ambulatory Visit: Payer: Self-pay

## 2012-12-03 ENCOUNTER — Encounter: Payer: Self-pay | Admitting: Gastroenterology

## 2012-12-03 ENCOUNTER — Other Ambulatory Visit (INDEPENDENT_AMBULATORY_CARE_PROVIDER_SITE_OTHER): Payer: Medicare Other

## 2012-12-03 ENCOUNTER — Ambulatory Visit (INDEPENDENT_AMBULATORY_CARE_PROVIDER_SITE_OTHER): Payer: Medicare Other | Admitting: Gastroenterology

## 2012-12-03 VITALS — BP 122/70 | HR 80 | Ht 66.0 in | Wt 182.2 lb

## 2012-12-03 DIAGNOSIS — K5150 Left sided colitis without complications: Secondary | ICD-10-CM

## 2012-12-03 LAB — BASIC METABOLIC PANEL
BUN: 15 mg/dL (ref 6–23)
CO2: 31 mEq/L (ref 19–32)
Calcium: 9.4 mg/dL (ref 8.4–10.5)
Chloride: 100 mEq/L (ref 96–112)
Creatinine, Ser: 0.8 mg/dL (ref 0.4–1.2)
GFR: 77.29 mL/min (ref >60.00)
Glucose, Bld: 99 mg/dL (ref 70–99)
Potassium: 3.9 mEq/L (ref 3.5–5.1)
Sodium: 139 mEq/L (ref 135–145)

## 2012-12-03 LAB — CBC WITH DIFFERENTIAL/PLATELET
Basophils Absolute: 0 10*3/uL (ref 0.0–0.1)
Basophils Relative: 0.4 % (ref 0.0–3.0)
Eosinophils Absolute: 0.1 10*3/uL (ref 0.0–0.7)
Eosinophils Relative: 1 % (ref 0.0–5.0)
HCT: 37.9 % (ref 36.0–46.0)
Hemoglobin: 12.8 g/dL (ref 12.0–15.0)
Lymphocytes Relative: 34.2 % (ref 12.0–46.0)
Lymphs Abs: 2.7 10*3/uL (ref 0.7–4.0)
MCHC: 33.7 g/dL (ref 30.0–36.0)
MCV: 93.1 fl (ref 78.0–100.0)
Monocytes Absolute: 0.4 10*3/uL (ref 0.1–1.0)
Monocytes Relative: 4.8 % (ref 3.0–12.0)
Neutro Abs: 4.8 10*3/uL (ref 1.4–7.7)
Neutrophils Relative %: 59.6 % (ref 43.0–77.0)
Platelets: 224 10*3/uL (ref 150.0–400.0)
RBC: 4.07 Mil/uL (ref 3.87–5.11)
RDW: 14 % (ref 11.5–14.6)
WBC: 8 10*3/uL (ref 4.5–10.5)

## 2012-12-03 MED ORDER — BALSALAZIDE DISODIUM 750 MG PO CAPS: ORAL_CAPSULE | ORAL | Status: DC

## 2012-12-03 MED ORDER — PEG-KCL-NACL-NASULF-NA ASC-C 100 G PO SOLR: 1.0000 | Freq: Once | ORAL | Status: DC

## 2012-12-03 NOTE — Progress Notes (Signed)
History of Present Illness: This is a 67 year old female who returns for followup of left-sided ulcerative colitis. Her symptoms have been under excellent control. She has no gastrointestinal complaints. She is due for surveillance colonoscopy. Denies weight loss, abdominal pain, constipation, diarrhea, change in stool caliber, melena, hematochezia, nausea, vomiting, dysphagia, reflux symptoms, chest pain.  Current Medications, Allergies, Past Medical History, Past Surgical History, Family History and Social History were reviewed in [employer] record.  Physical Exam: General: Well developed , well nourished, no acute distress Head: Normocephalic and atraumatic Eyes:  sclerae anicteric, EOMI Ears: Normal auditory acuity Mouth: No deformity or lesions Lungs: Clear throughout to auscultation Heart: Regular rate and rhythm; no murmurs, rubs or bruits Abdomen: Soft, non tender and non distended. No masses, hepatosplenomegaly or hernias noted. Normal Bowel sounds Rectal: deferred to colonoscopy Musculoskeletal: Symmetrical with no gross deformities  Pulses:  Normal pulses noted Extremities: No clubbing, cyanosis, edema or deformities noted Neurological: Alert oriented x 4, grossly nonfocal Psychological:  Alert and cooperative. Normal mood and affect  Assessment and Recommendations:  1. Left-sided ulcerative colitis. Continue current dose of balsalazide. Obtain a CBC and BMET. Schedule colonoscopy. The risks, benefits, and alternatives to colonoscopy with possible biopsy and possible polypectomy were discussed with the patient and they consent to proceed.   2. Personal history of adenomatous colon polyps. Colonoscopy as above.

## 2012-12-03 NOTE — Patient Instructions (Addendum)
Your physician has requested that you go to the basement for the following lab work before leaving today: CBC, bmet.   We have sent the following medications to your pharmacy for you to pick up at your convenience: balsalazide.  You have been scheduled for a colonoscopy with propofol. Please follow written instructions given to you at your visit today.  Please pick up your prep kit at the pharmacy within the next 1-3 days. If you use inhalers (even only as needed), please bring them with you on the day of your procedure. Your physician has requested that you go to www.startemmi.com and enter the access code given to you at your visit today. This web site gives a general overview about your procedure. However, you should still follow specific instructions given to you by our office regarding your preparation for the procedure.  Thank you for choosing me and [facility].  Venita Lick. Pleas Koch., MD., Clement[facility] Graham  cc: Merri Brunette, MD

## 2012-12-19 ENCOUNTER — Encounter: Payer: Self-pay | Admitting: Gastroenterology

## 2012-12-19 ENCOUNTER — Ambulatory Visit (AMBULATORY_SURGERY_CENTER): Payer: Medicare Other | Admitting: Gastroenterology

## 2012-12-19 VITALS — BP 121/64 | HR 67 | Temp 98.3°F | Resp 28 | Ht 66.0 in | Wt 182.0 lb

## 2012-12-19 DIAGNOSIS — D126 Benign neoplasm of colon, unspecified: Secondary | ICD-10-CM

## 2012-12-19 DIAGNOSIS — K5150 Left sided colitis without complications: Secondary | ICD-10-CM

## 2012-12-19 DIAGNOSIS — Z86010 Personal history of colonic polyps: Secondary | ICD-10-CM

## 2012-12-19 MED ORDER — SODIUM CHLORIDE 0.9 % IV SOLN: 500.0000 mL | INTRAVENOUS | Status: DC

## 2012-12-19 NOTE — Progress Notes (Signed)
Called to room to assist during endoscopic procedure.  Patient ID and intended procedure confirmed with present staff. Received instructions for my participation in the procedure from the performing physician.  

## 2012-12-19 NOTE — Progress Notes (Signed)
Patient did not experience any of the following events: a burn prior to discharge; a fall within the facility; wrong site/side/patient/procedure/implant event; or a hospital transfer or hospital admission upon discharge from the facility. (G8907) Patient did not have preoperative order for IV antibiotic SSI prophylaxis. (G8918)  

## 2012-12-19 NOTE — Op Note (Signed)
[facility] [address] [place], [address]   COLONOSCOPY PROCEDURE REPORT  PATIENT: [name] [name], [name] [name]  MR#: [id number] BIRTHDATE: 1945/09/03 , 67  yrs. old GENDER: Female ENDOSCOPIST: Meryl Dare, MD, [facility] PROCEDURE DATE:  12/19/2012 PROCEDURE:   Colonoscopy with biopsy ASA CLASS:   Class II INDICATIONS:elevated risk screening, High risk patient with previously diagnosed UC left-sided colitis, and Patient's personal history of adenomatous colon polyps. MEDICATIONS: MAC sedation, administered by CRNA and propofol (Diprivan) 350mg  IV DESCRIPTION OF PROCEDURE:   After the risks benefits and alternatives of the procedure were thoroughly explained, informed consent was obtained.  A digital rectal exam revealed no abnormalities of the rectum.   The [id number]  endoscope was introduced through the anus and advanced to the cecum, which was identified by both the appendix and ileocecal valve. No adverse events experienced  with a tortuous colon.   The quality of the prep was adequate, using MoviPrep  The instrument was then slowly withdrawn as the colon was fully examined.  COLON FINDINGS: A normal appearing cecum, ileocecal valve, and appendiceal orifice were identified.  The ascending, hepatic flexure, transverse, splenic flexure, descending, sigmoid colon and rectum appeared unremarkable.  No polyps or cancers were seen. Multiple random biopsies of the area were performed.  Retroflexed views revealed no abnormalities. The time to cecum=6 minutes 37 seconds.  Withdrawal time=9 minutes 18 seconds.  The scope was withdrawn and the procedure completed.  COMPLICATIONS: There were no complications.  ENDOSCOPIC IMPRESSION: 1.  Normal colon; multiple random biopsies of the area were performed  RECOMMENDATIONS: 1.  Await pathology results 2.  Repeat Colonoscopy in 2-3 years pending path review.   eSigned:  Meryl Dare, MD, [facility] 12/19/2012 3:41  PM   cc: Merri Brunette, MD

## 2012-12-19 NOTE — Patient Instructions (Addendum)

## 2012-12-20 ENCOUNTER — Telehealth: Payer: Self-pay | Admitting: *Deleted

## 2012-12-20 NOTE — Telephone Encounter (Signed)
  Follow up Call-  Call back number 12/19/2012 11/17/2010  Post procedure Call Back phone  # [phone] work # [phone]  Permission to leave phone message Yes -     Patient questions:  Message left to call if necessary.

## 2012-12-24 ENCOUNTER — Other Ambulatory Visit: Payer: Self-pay | Admitting: *Deleted

## 2012-12-25 ENCOUNTER — Encounter: Payer: Self-pay | Admitting: Gastroenterology

## 2013-01-03 ENCOUNTER — Other Ambulatory Visit: Payer: Self-pay | Admitting: *Deleted

## 2013-01-03 DIAGNOSIS — I10 Essential (primary) hypertension: Secondary | ICD-10-CM

## 2013-01-03 DIAGNOSIS — I2510 Atherosclerotic heart disease of native coronary artery without angina pectoris: Secondary | ICD-10-CM

## 2013-01-03 MED ORDER — CARVEDILOL 3.125 MG PO TABS: 3.1250 mg | ORAL_TABLET | Freq: Two times a day (BID) | ORAL | Status: DC

## 2013-01-31 ENCOUNTER — Encounter: Payer: Self-pay | Admitting: Cardiovascular Disease

## 2013-01-31 ENCOUNTER — Ambulatory Visit (INDEPENDENT_AMBULATORY_CARE_PROVIDER_SITE_OTHER): Payer: Medicare Other | Admitting: Cardiovascular Disease

## 2013-01-31 VITALS — BP 112/74 | HR 72 | Ht 66.0 in | Wt 179.0 lb

## 2013-01-31 DIAGNOSIS — I2510 Atherosclerotic heart disease of native coronary artery without angina pectoris: Secondary | ICD-10-CM

## 2013-01-31 DIAGNOSIS — E785 Hyperlipidemia, unspecified: Secondary | ICD-10-CM

## 2013-01-31 NOTE — Patient Instructions (Addendum)
Your physician recommends that you return for lab work in: 6 months for fasting lipids & lfts prior to your office visit  Your physician wants you to follow-up in: 6 months with Dr. Clifton James.  You will receive a reminder letter in the mail two months in advance. If you don't receive a letter, please call our office to schedule the follow-up appointment.

## 2013-01-31 NOTE — Progress Notes (Signed)
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 She has been followed in the past by Dr. Doreatha Lew. Last stress test March 2010 with no ischemia, EF of 68%. Her last catheterization was in 2002. She was seen by Truitt Merle 12/12/11 with c/o tachycardia. Coreg was added and a stress myoview was arranged. This was a low risk study with probable shifting breast attenuation artifact. I last saw her February 2014.   She is here today for follow up. She tells me today that she is feeling well. No chest pain or SOB. She has been active. HR well controlled on Coreg. No dizziness, near syncope or syncope. She has been tolerating her medications well.   Primary Care Physician: [name]   Last Lipid Profile:Lipid Panel     Component Value Date/Time   CHOL 158 10/16/2012 0822   TRIG 90.0 10/16/2012 0822   HDL 49.90 10/16/2012 0822   CHOLHDL 3 10/16/2012 0822   VLDL 18.0 10/16/2012 0822   LDLCALC 90 10/16/2012 9163    Past Medical History  Diagnosis Date  . Arthritis   . Hyperlipidemia   . Hypertension   . Ulcerative colitis   . CAD (coronary artery disease)     2V CABG 1996 (LIMA to LAD, SVG to RCA) Dr. Servando Snare.; last stress test in 2010  . Adenomatous colon polyp 11/2005  . Muscle cramps     and pains    Past Surgical History  Procedure Laterality Date  . Colonoscopy    . Polypectomy    . Upper gastrointestinal endoscopy    . Coronary artery bypass graft  1996    x 2  . Back surgery      lumbar fusion 25 years ago and ruptured disk 10 years ago same area    Current Outpatient Prescriptions  Medication Sig Dispense Refill  . aspirin 81 MG tablet Take 81 mg by mouth daily.        . balsalazide (COLAZAL) 750 MG capsule Take two tablet by mouth three times a day  540 capsule  1  . carvedilol (COREG) 3.125 MG tablet Take 1 tablet (3.125  mg total) by mouth 2 (two) times daily.  180 tablet  0  . cholecalciferol (VITAMIN D) 1000 UNITS tablet Take 1,000 Units by mouth daily.        Marland Kitchen LORazepam (ATIVAN) 0.5 MG tablet prn      . Multiple Vitamins-Minerals (MULTIVITAMIN WITH MINERALS) tablet Take 1 tablet by mouth daily.        . nitroGLYCERIN (NITROSTAT) 0.4 MG SL tablet Place 1 tablet (0.4 mg total) under the tongue every 5 (five) minutes as needed for chest pain.  25 tablet  6  . simvastatin (ZOCOR) 20 MG tablet Take 1 tablet (20 mg total) by mouth every evening.  90 tablet  3  . valsartan-hydrochlorothiazide (DIOVAN-HCT) 160-25 MG per tablet Take 1 tablet by mouth daily.  90 tablet  3   No current facility-administered medications for this visit.    No Known Allergies  History   Social History  . Marital Status: Married    Spouse Name: N/A    Number of Children: N/A  . Years of Education: N/A   Occupational History  . Not on file.   Social History Main Topics  . Smoking status: Former Smoker    Quit date:  10/04/1994  . Smokeless tobacco: Never Used  . Alcohol Use: No  . Drug Use: No  . Sexual Activity: Not Currently   Other Topics Concern  . Not on file   Social History Narrative  . No narrative on file    Family History  Problem Relation Age of Onset  . Lung cancer Brother   . Breast cancer Sister   . Kidney disease Brother     renal cancer  . Diabetes Mother   . Heart disease Mother   . Heart disease Sister   . Heart attack Mother 60    Review of Systems:  As stated in the HPI and otherwise negative.   BP 112/74  Pulse 72  Ht 5' 6"  (1.676 m)  Wt 179 lb (81.194 kg)  BMI 28.91 kg/m2  SpO2 96%  Physical Examination: General: Well developed, well nourished, NAD HEENT: OP clear, mucus membranes moist SKIN: warm, dry. No rashes. Neuro: No focal deficits Musculoskeletal: Muscle strength 5/5 all ext Psychiatric: Mood and affect normal Neck: No JVD, no carotid bruits, no thyromegaly, no  lymphadenopathy. Lungs:Clear bilaterally, no wheezes, rhonci, crackles Cardiovascular: Regular rate and rhythm. No murmurs, gallops or rubs. Abdomen:Soft. Bowel sounds present. Non-tender.  Extremities: No lower extremity edema. Pulses are 2 + in the bilateral DP/PT.  EKG: NSR, rate 72 bpm. Poor R wave progression precordial leads.   Assessment and Plan:   1. CAD: Stable. No ischemia on stress myoview July 2013. LVEF 69%. BP is well controlled. Lipids are at goal.   2. HTN: BP is stable. No changes.

## 2013-04-10 ENCOUNTER — Encounter: Payer: Self-pay | Admitting: Cardiology

## 2013-04-15 ENCOUNTER — Other Ambulatory Visit: Payer: Self-pay | Admitting: Cardiovascular Disease

## 2013-07-13 ENCOUNTER — Other Ambulatory Visit: Payer: Self-pay | Admitting: Cardiovascular Disease

## 2013-07-31 ENCOUNTER — Ambulatory Visit: Payer: Medicare Other | Admitting: Cardiovascular Disease

## 2013-08-21 ENCOUNTER — Other Ambulatory Visit: Payer: Self-pay | Admitting: Cardiovascular Disease

## 2013-08-22 ENCOUNTER — Other Ambulatory Visit: Payer: Self-pay | Admitting: Cardiovascular Disease

## 2013-09-23 ENCOUNTER — Encounter: Payer: Self-pay | Admitting: Cardiovascular Disease

## 2013-09-23 ENCOUNTER — Ambulatory Visit (INDEPENDENT_AMBULATORY_CARE_PROVIDER_SITE_OTHER): Payer: Private Health Insurance - Indemnity | Admitting: Cardiovascular Disease

## 2013-09-23 VITALS — BP 136/69 | HR 68 | Ht 66.0 in | Wt 188.4 lb

## 2013-09-23 DIAGNOSIS — I2510 Atherosclerotic heart disease of native coronary artery without angina pectoris: Secondary | ICD-10-CM

## 2013-09-23 DIAGNOSIS — I10 Essential (primary) hypertension: Secondary | ICD-10-CM

## 2013-09-23 DIAGNOSIS — E785 Hyperlipidemia, unspecified: Secondary | ICD-10-CM

## 2013-09-23 LAB — HEPATIC FUNCTION PANEL
ALT: 19 U/L (ref 0–35)
AST: 23 U/L (ref 0–37)
Albumin: 3.9 g/dL (ref 3.5–5.2)
Alkaline Phosphatase: 50 U/L (ref 39–117)
Bilirubin, Direct: 0.1 mg/dL (ref 0.0–0.3)
Total Bilirubin: 0.9 mg/dL (ref 0.3–1.2)
Total Protein: 6.9 g/dL (ref 6.0–8.3)

## 2013-09-23 LAB — LIPID PANEL
Cholesterol: 149 mg/dL (ref 0–200)
HDL: 52.9 mg/dL (ref >39.00)
LDL Cholesterol: 80 mg/dL (ref 0–99)
Total CHOL/HDL Ratio: 3
Triglycerides: 81 mg/dL (ref 0.0–149.0)
VLDL: 16.2 mg/dL (ref 0.0–40.0)

## 2013-09-23 MED ORDER — VALSARTAN-HYDROCHLOROTHIAZIDE 160-25 MG PO TABS: ORAL_TABLET | ORAL | Status: DC

## 2013-09-23 MED ORDER — CARVEDILOL 3.125 MG PO TABS: ORAL_TABLET | ORAL | Status: DC

## 2013-09-23 MED ORDER — SIMVASTATIN 10 MG PO TABS: 10.0000 mg | ORAL_TABLET | Freq: Every day | ORAL | Status: DC

## 2013-09-23 NOTE — Patient Instructions (Signed)
Your physician wants you to follow-up in: 12 months.  You will receive a reminder letter in the mail two months in advance. If you don't receive a letter, please call our office to schedule the follow-up appointment.  Your physician recommends that you continue on your current medications as directed. Please refer to the Current Medication list given to you today.   

## 2013-09-23 NOTE — Progress Notes (Signed)
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 She has been followed in the past by Dr. Doreatha Lew. Last stress test July 2013  with no ischemia, EF of 68%. Her last catheterization was in 2002. She was seen by Truitt Merle 12/12/11 with c/o tachycardia. Coreg was added and a stress myoview was arranged. This was a low risk study with probable shifting breast attenuation artifact. I last saw her in August 2014.   She is here today for follow up. She tells me today that she is feeling well. No chest pain or SOB. She has been active but not walking every day.  No dizziness, near syncope or syncope. She has been tolerating her medications well but does have a dry cough. She is on an ARB. Her sister has Alz. dementia so she has been stressed about this.   Primary Care Physician: [name]   Last Lipid Profile:Lipid Panel     Component Value Date/Time   CHOL 158 10/16/2012 0822   TRIG 90.0 10/16/2012 0822   HDL 49.90 10/16/2012 0822   CHOLHDL 3 10/16/2012 0822   VLDL 18.0 10/16/2012 0822   LDLCALC 90 10/16/2012 1610    Past Medical History  Diagnosis Date  . Arthritis   . Hyperlipidemia   . Hypertension   . Ulcerative colitis   . CAD (coronary artery disease)     2V CABG 1996 (LIMA to LAD, SVG to RCA) Dr. Servando Snare.; last stress test in 2010  . Adenomatous colon polyp 11/2005  . Muscle cramps     and pains    Past Surgical History  Procedure Laterality Date  . Colonoscopy    . Polypectomy    . Upper gastrointestinal endoscopy    . Coronary artery bypass graft  1996    x 2  . Back surgery      lumbar fusion 25 years ago and ruptured disk 10 years ago same area    Current Outpatient Prescriptions  Medication Sig Dispense Refill  . aspirin 81 MG tablet Take 81 mg by mouth daily.        . balsalazide (COLAZAL) 750 MG capsule Take  one tablet by mouth three times a day      . carvedilol (COREG) 3.125 MG tablet TAKE 1 TABLET (3.125 MG TOTAL) BY MOUTH 2 (TWO) TIMES DAILY.  60 tablet  0  . cholecalciferol (VITAMIN D) 1000 UNITS tablet Take 1,000 Units by mouth daily.        Marland Kitchen LORazepam (ATIVAN) 0.5 MG tablet Take 0.5 mg by mouth as needed.       . Multiple Vitamins-Minerals (MULTIVITAMIN WITH MINERALS) tablet Take 1 tablet by mouth daily.        . nitroGLYCERIN (NITROSTAT) 0.4 MG SL tablet Place 1 tablet (0.4 mg total) under the tongue every 5 (five) minutes as needed for chest pain.  25 tablet  6  . simvastatin (ZOCOR) 10 MG tablet Take 10 mg by mouth daily.      . valsartan-hydrochlorothiazide (DIOVAN-HCT) 160-25 MG per tablet TAKE 1 TABLET BY MOUTH DAILY.  90 tablet  0   No current facility-administered medications for this visit.    No Known Allergies  History   Social History  . Marital Status: Married    Spouse Name: N/A    Number of Children: N/A  . Years  of Education: N/A   Occupational History  . Not on file.   Social History Main Topics  . Smoking status: Former Smoker    Quit date: 10/04/1994  . Smokeless tobacco: Never Used  . Alcohol Use: No  . Drug Use: No  . Sexual Activity: Not Currently   Other Topics Concern  . Not on file   Social History Narrative  . No narrative on file    Family History  Problem Relation Age of Onset  . Lung cancer Brother   . Breast cancer Sister   . Kidney disease Brother     renal cancer  . Diabetes Mother   . Heart disease Mother   . Heart disease Sister   . Heart attack Mother 56    Review of Systems:  As stated in the HPI and otherwise negative.   BP 136/69  Pulse 68  Ht 5' 6"  (1.676 m)  Wt 188 lb 6.4 oz (85.458 kg)  BMI 30.42 kg/m2  Physical Examination: General: Well developed, well nourished, NAD HEENT: OP clear, mucus membranes moist SKIN: warm, dry. No rashes. Neuro: No focal deficits Musculoskeletal: Muscle strength 5/5 all  ext Psychiatric: Mood and affect normal Neck: No JVD, no carotid bruits, no thyromegaly, no lymphadenopathy. Lungs:Clear bilaterally, no wheezes, rhonci, crackles Cardiovascular: Regular rate and rhythm. No murmurs, gallops or rubs. Abdomen:Soft. Bowel sounds present. Non-tender.  Extremities: No lower extremity edema. Pulses are 2 + in the bilateral DP/PT.  EKG: Sinus, rate 68 bpm. Possible old septal infarct.   Assessment and Plan:   1. CAD: Stable. No ischemia on stress myoview July 2013. LVEF 69%. BP is well controlled. Lipids are at goal. No changes today.   2. HTN: BP is stable. No changes. If her dry cough persists, consider changing ARB to another medication.   3. Hyperlipidemia: Lipids have been well controlled on a statin. Repeat lipids and LFTs today.

## 2013-09-26 ENCOUNTER — Other Ambulatory Visit: Payer: Self-pay | Admitting: Cardiovascular Disease

## 2013-12-04 ENCOUNTER — Other Ambulatory Visit: Payer: Self-pay | Admitting: Gastroenterology

## 2013-12-13 ENCOUNTER — Other Ambulatory Visit: Payer: Self-pay | Admitting: Gastroenterology

## 2013-12-20 ENCOUNTER — Telehealth: Payer: Self-pay | Admitting: Gastroenterology

## 2013-12-20 MED ORDER — BALSALAZIDE DISODIUM 750 MG PO CAPS: ORAL_CAPSULE | ORAL | Status: DC

## 2013-12-20 NOTE — Telephone Encounter (Signed)
Prescription sent to patient's pharmacy until scheduled appt on 01/09/14.

## 2014-01-09 ENCOUNTER — Ambulatory Visit: Payer: Private Health Insurance - Indemnity | Admitting: Gastroenterology

## 2014-02-03 ENCOUNTER — Encounter: Payer: Self-pay | Admitting: Gastroenterology

## 2014-02-03 ENCOUNTER — Ambulatory Visit (INDEPENDENT_AMBULATORY_CARE_PROVIDER_SITE_OTHER): Payer: Private Health Insurance - Indemnity | Admitting: Gastroenterology

## 2014-02-03 VITALS — BP 120/60 | HR 64 | Ht 66.0 in | Wt 183.0 lb

## 2014-02-03 DIAGNOSIS — Z86010 Personal history of colonic polyps: Secondary | ICD-10-CM

## 2014-02-03 DIAGNOSIS — K5150 Left sided colitis without complications: Secondary | ICD-10-CM

## 2014-02-03 DIAGNOSIS — Z800 Family history of malignant neoplasm of digestive organs: Secondary | ICD-10-CM

## 2014-02-03 MED ORDER — BALSALAZIDE DISODIUM 750 MG PO CAPS: ORAL_CAPSULE | ORAL | Status: DC

## 2014-02-03 NOTE — Progress Notes (Signed)
    History of Present Illness: This is a 68 year old female with a history of adenomatous colon polyps and left-sided ulcerative colitis. She underwent surveillance colonoscopy in July 2014 the colonoscopy was normal including normal random biopsies. He has no gastrointestinal complaints. She states her daughter at age 90 was diagnosed with stage III colon cancer.  Current Medications, Allergies, Past Medical History, Past Surgical History, Family History and Social History were reviewed in [employer] record.  Physical Exam: General: Well developed, well nourished, no acute distress Head: Normocephalic and atraumatic Eyes:  sclerae anicteric, EOMI Ears: Normal auditory acuity Mouth: No deformity or lesions Lungs: Clear throughout to auscultation Heart: Regular rate and rhythm; no murmurs, rubs or bruits Abdomen: Soft, non tender and non distended. No masses, hepatosplenomegaly or hernias noted. Normal Bowel sounds Musculoskeletal: Symmetrical with no gross deformities  Pulses:  Normal pulses noted Extremities: No clubbing, cyanosis, edema or deformities noted Neurological: Alert oriented x 4, grossly nonfocal Psychological:  Alert and cooperative. Normal mood and affect  Assessment and Recommendations:  1. Left-sided ulcerative colitis. Continue current dose of balsalazide.    2. Personal history of adenomatous colon polyps and family history of colon cancer in her daughter at age 59.  Colonoscopy due in 12/2015 for problems #1 and #2.

## 2014-02-03 NOTE — Patient Instructions (Signed)
We have sent the following prescriptions to your mail in pharmacy: [facility].   If you have not heard from your mail in pharmacy within 1 week or if you have not received your medication in the mail, please contact us at [phone] so we may find out why.  Thank you for choosing me and [facility].  Pricilla Riffle. Dagoberto Ligas., MD., Mar[facility] Regal

## 2014-02-04 ENCOUNTER — Other Ambulatory Visit: Payer: Self-pay | Admitting: Gastroenterology

## 2014-02-17 ENCOUNTER — Other Ambulatory Visit: Payer: Self-pay | Admitting: Gastroenterology

## 2014-03-11 ENCOUNTER — Other Ambulatory Visit: Payer: Self-pay | Admitting: Gastroenterology

## 2014-03-15 ENCOUNTER — Other Ambulatory Visit: Payer: Self-pay | Admitting: Gastroenterology

## 2014-03-19 ENCOUNTER — Other Ambulatory Visit: Payer: Self-pay | Admitting: Gastroenterology

## 2014-03-20 ENCOUNTER — Telehealth: Payer: Self-pay | Admitting: Gastroenterology

## 2014-03-20 MED ORDER — BALSALAZIDE DISODIUM 750 MG PO CAPS: ORAL_CAPSULE | ORAL | Status: DC

## 2014-03-20 NOTE — Telephone Encounter (Signed)
D/C'ed rx for [facility] with [name] [name] since patient wants rx to go to local pharmacy instead. New rx sent to local pharmacy.

## 2014-03-27 ENCOUNTER — Encounter: Payer: Self-pay | Admitting: Gastroenterology

## 2014-06-08 ENCOUNTER — Ambulatory Visit (INDEPENDENT_AMBULATORY_CARE_PROVIDER_SITE_OTHER): Payer: Private Health Insurance - Indemnity

## 2014-06-08 ENCOUNTER — Ambulatory Visit (INDEPENDENT_AMBULATORY_CARE_PROVIDER_SITE_OTHER): Payer: Private Health Insurance - Indemnity | Admitting: Family Medicine

## 2014-06-08 VITALS — BP 118/72 | HR 76 | Temp 98.4°F | Resp 16 | Ht 65.5 in | Wt 179.0 lb

## 2014-06-08 DIAGNOSIS — R05 Cough: Secondary | ICD-10-CM

## 2014-06-08 DIAGNOSIS — R059 Cough, unspecified: Secondary | ICD-10-CM

## 2014-06-08 DIAGNOSIS — J209 Acute bronchitis, unspecified: Secondary | ICD-10-CM

## 2014-06-08 MED ORDER — METHYLPREDNISOLONE ACETATE 80 MG/ML IJ SUSP
80.0000 mg | Freq: Once | INTRAMUSCULAR | Status: AC
  Administered 2014-06-08: 80 mg via INTRAMUSCULAR

## 2014-06-08 MED ORDER — HYDROCODONE-HOMATROPINE 5-1.5 MG/5ML PO SYRP
5.0000 mL | ORAL_SOLUTION | Freq: Three times a day (TID) | ORAL | Status: DC | PRN
Start: 2014-06-08 — End: 2014-10-10

## 2014-06-08 MED ORDER — LEVOFLOXACIN 500 MG PO TABS: 500.0000 mg | ORAL_TABLET | Freq: Every day | ORAL | Status: DC

## 2014-06-08 NOTE — Patient Instructions (Signed)

## 2014-06-08 NOTE — Progress Notes (Signed)
   Subjective:    Patient ID: [name] [name], female    DOB: 01-31-1946, 68 y.o.   MRN: [id number] This chart was scribed for Robyn Haber, MD by Zola Button, Medical Scribe. This patient was seen in Room 11 and the patient's care was started at 2:01 PM.   HPI HPI Comments: [name] [name] is a 68 y.o. female who presents to the [facility] complaining of gradual onset URI symptoms that began 1 week ago. Patient reports having an occasionally productive cough, congestion and diaphoresis. She also had difficulty sleeping last night due to her symptoms; she had to sleep in a chair because the cough is worse when laying down. Patient denies fever. She also denies hx of asthma. She does have a hx of smoking; she quit smoking in 1996. Her husband, who is also here today, has been having similar symptoms and started having symptoms 2 weeks before the patient started having symptoms.   Patient currently works in [employer], [employer].   Review of Systems  Constitutional: Positive for diaphoresis. Negative for fever.  HENT: Positive for congestion.   Respiratory: Positive for cough.        Objective:   Physical Exam CONSTITUTIONAL: Well developed/well nourished HEAD: Normocephalic/atraumatic EYES: EOM/PERRL ENMT: Mucous membranes moist NECK: supple no meningeal signs SPINE: entire spine nontender CV: S1/S2 noted, no murmurs/rubs/gallops noted LUNGS: Lungs are clear to auscultation bilaterally, no apparent distress ABDOMEN: soft, nontender, no rebound or guarding GU: no cva tenderness NEURO: Pt is awake/alert, moves all extremitiesx4 EXTREMITIES: pulses normal, full ROM SKIN: warm, color normal PSYCH: no abnormalities of mood noted  [facility] reading (PRIMARY) by  Dr. Joseph Art CXR normal.     Assessment & Plan:   This chart was scribed in my presence and reviewed by me personally.    ICD-9-CM ICD-10-CM   1. Acute bronchitis, unspecified organism 466.0 J20.9  methylPREDNISolone acetate (DEPO-MEDROL) injection 80 mg     levofloxacin (LEVAQUIN) 500 MG tablet     HYDROcodone-homatropine (HYCODAN) 5-1.5 MG/5ML syrup  2. Cough 786.2 R05 DG Chest 2 View     Signed, Robyn Haber, MD

## 2014-09-25 ENCOUNTER — Other Ambulatory Visit: Payer: Self-pay | Admitting: Cardiovascular Disease

## 2014-10-09 NOTE — Progress Notes (Signed)
Chief Complaint  Patient presents with  . 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 She has been followed in the past by Dr. Doreatha Lew. Her last catheterization was in 2002. She was seen by Truitt Merle 12/12/11 with c/o tachycardia. Coreg was added and a stress myoview was arranged. This was a low risk study with probable shifting breast attenuation artifact.  She is here today for follow up. She has been having daily chest pain, described as pressure and worsened with activity. Associated diaphoresis. No SOB. Resolves with NTG. No dizziness, near syncope or syncope. Her sister has Alzheimers dementia so she has been stressed about this.   Primary Care Physician: [name]   Last Lipid Profile:Lipid Panel     Component Value Date/Time   CHOL 149 09/23/2013 0837   TRIG 81.0 09/23/2013 0837   HDL 52.90 09/23/2013 0837   CHOLHDL 3 09/23/2013 0837   VLDL 16.2 09/23/2013 0837   LDLCALC 80 09/23/2013 0837    Past Medical History  Diagnosis Date  . Arthritis   . Hyperlipidemia   . Hypertension   . Ulcerative colitis   . CAD (coronary artery disease)     2V CABG 1996 (LIMA to LAD, SVG to RCA) Dr. Servando Snare.; last stress test in 2010  . Adenomatous colon polyp 11/2005  . Muscle cramps     and pains    Past Surgical History  Procedure Laterality Date  . Colonoscopy    . Polypectomy    . Upper gastrointestinal endoscopy    . Coronary artery bypass graft  1996    x 2  . Back surgery      lumbar fusion 25 years ago and ruptured disk 10 years ago same area    Current Outpatient Prescriptions  Medication Sig Dispense Refill  . aspirin 81 MG tablet Take 81 mg by mouth daily.      . balsalazide (COLAZAL) 750 MG capsule Take one tablet by mouth three times a day 90 capsule 10  . carvedilol (COREG) 3.125  MG tablet TAKE 1 TABLET (3.125 MG TOTAL) BY MOUTH 2 (TWO) TIMES DAILY. 180 tablet 0  . cholecalciferol (VITAMIN D) 1000 UNITS tablet Take 1,000 Units by mouth daily.      Marland Kitchen FLUoxetine (PROZAC) 10 MG capsule Take 10 mg by mouth daily.     Marland Kitchen LORazepam (ATIVAN) 0.5 MG tablet Take 0.5 mg by mouth as needed for anxiety (for anxiety).     . Multiple Vitamins-Minerals (MULTIVITAMIN WITH MINERALS) tablet Take 1 tablet by mouth daily.      . simvastatin (ZOCOR) 10 MG tablet Take 1 tablet (10 mg total) by mouth daily. 90 tablet 3  . valsartan-hydrochlorothiazide (DIOVAN-HCT) 160-25 MG per tablet TAKE 1 TABLET BY MOUTH DAILY. 90 tablet 3  . nitroGLYCERIN (NITROSTAT) 0.4 MG SL tablet Place 1 tablet (0.4 mg total) under the tongue every 5 (five) minutes as needed for chest pain. 25 tablet 6   No current facility-administered medications for this visit.    No Known Allergies  History   Social History  . Marital Status: Married    Spouse Name: N/A  . Number of Children: N/A  . Years of Education: N/A   Occupational History  . Not on file.   Social History Main Topics  . Smoking status: Former  Smoker    Quit date: 10/04/1994  . Smokeless tobacco: Never Used  . Alcohol Use: No  . Drug Use: No  . Sexual Activity: Not Currently   Other Topics Concern  . Not on file   Social History Narrative    Family History  Problem Relation Age of Onset  . Lung cancer Brother   . Breast cancer Sister   . Kidney disease Brother     renal cancer  . Diabetes Mother   . Heart disease Mother   . Heart attack Mother 2  . Heart disease Sister   . Colon cancer Daughter     Review of Systems:  As stated in the HPI and otherwise negative.   BP 130/64 mmHg  Pulse 73  Ht 5' 5.5" (1.664 m)  Wt 182 lb 12.8 oz (82.918 kg)  BMI 29.95 kg/m2  Physical Examination: General: Well developed, well nourished, NAD HEENT: OP clear, mucus membranes moist SKIN: warm, dry. No rashes. Neuro: No focal  deficits Musculoskeletal: Muscle strength 5/5 all ext Psychiatric: Mood and affect normal Neck: No JVD, no carotid bruits, no thyromegaly, no lymphadenopathy. Lungs:Clear bilaterally, no wheezes, rhonci, crackles Cardiovascular: Regular rate and rhythm. No murmurs, gallops or rubs. Abdomen:Soft. Bowel sounds present. Non-tender.  Extremities: No lower extremity edema. Pulses are 2 + in the bilateral DP/PT.  EKG:  EKG is ordered today. The ekg ordered today demonstrates: NSR, rate 73 bpm. Poor R wave progression septal leads.   Recent Labs: No results found for requested labs within last 365 days.   Lipid Panel    Component Value Date/Time   CHOL 149 09/23/2013 0837   TRIG 81.0 09/23/2013 0837   HDL 52.90 09/23/2013 0837   CHOLHDL 3 09/23/2013 0837   VLDL 16.2 09/23/2013 0837   LDLCALC 80 09/23/2013 0837     Wt Readings from Last 3 Encounters:  10/10/14 182 lb 12.8 oz (82.918 kg)  06/08/14 179 lb (81.194 kg)  02/03/14 183 lb (83.008 kg)     Other studies Reviewed: Additional studies/ records that were reviewed today include:  Review of the above records demonstrates:    Assessment and Plan:   1. CAD/unstable angina: Recent chest pain concerning for unstable angina. She has not had a cath since 2002. 2V CABG in 1996. Will arrange cardiac cath with possible PCI on Monday Oct 13, 2014. Risks and benefits of procedure reviewed with the patient. Pre-cath labs today.   2. HTN: BP is stable. No changes.   3. Hyperlipidemia: Lipids have been well controlled on a statin. No changes  Current medicines are reviewed at length with the patient today.  The patient does not have concerns regarding medicines.  The following changes have been made:  no change  Labs/ tests ordered today include:Cardiac cath No orders of the defined types were placed in this encounter.    Disposition:   FU with 6 in 4 weeks  Signed, Lauree Chandler, MD 10/10/2014 9:00 AM    [address], [place], [place]  [address] Phone: [phone]; Fax: [phone]

## 2014-10-10 ENCOUNTER — Ambulatory Visit (INDEPENDENT_AMBULATORY_CARE_PROVIDER_SITE_OTHER): Payer: Medicare HMO | Admitting: Cardiovascular Disease

## 2014-10-10 ENCOUNTER — Encounter: Payer: Self-pay | Admitting: *Deleted

## 2014-10-10 ENCOUNTER — Encounter: Payer: Self-pay | Admitting: Cardiovascular Disease

## 2014-10-10 VITALS — BP 130/64 | HR 73 | Ht 65.5 in | Wt 182.8 lb

## 2014-10-10 DIAGNOSIS — E785 Hyperlipidemia, unspecified: Secondary | ICD-10-CM | POA: Diagnosis not present

## 2014-10-10 DIAGNOSIS — I25110 Atherosclerotic heart disease of native coronary artery with unstable angina pectoris: Secondary | ICD-10-CM

## 2014-10-10 DIAGNOSIS — I10 Essential (primary) hypertension: Secondary | ICD-10-CM | POA: Diagnosis not present

## 2014-10-10 LAB — CBC
HCT: 37.6 % (ref 36.0–46.0)
Hemoglobin: 12.9 g/dL (ref 12.0–15.0)
MCHC: 34.2 g/dL (ref 30.0–36.0)
MCV: 91.6 fl (ref 78.0–100.0)
Platelets: 262 10*3/uL (ref 150.0–400.0)
RBC: 4.1 Mil/uL (ref 3.87–5.11)
RDW: 13.9 % (ref 11.5–15.5)
WBC: 6.3 10*3/uL (ref 4.0–10.5)

## 2014-10-10 LAB — PROTIME-INR
INR: 1 ratio (ref 0.8–1.0)
Prothrombin Time: 11.4 s (ref 9.6–13.1)

## 2014-10-10 LAB — BASIC METABOLIC PANEL
BUN: 10 mg/dL (ref 6–23)
CO2: 31 mEq/L (ref 19–32)
Calcium: 9.8 mg/dL (ref 8.4–10.5)
Chloride: 103 mEq/L (ref 96–112)
Creatinine, Ser: 0.76 mg/dL (ref 0.40–1.20)
GFR: 80.37 mL/min (ref >60.00)
Glucose, Bld: 106 mg/dL — ABNORMAL HIGH (ref 70–99)
Potassium: 4 mEq/L (ref 3.5–5.1)
Sodium: 140 mEq/L (ref 135–145)

## 2014-10-10 MED ORDER — NITROGLYCERIN 0.4 MG SL SUBL: 0.4000 mg | SUBLINGUAL_TABLET | SUBLINGUAL | Status: DC | PRN

## 2014-10-10 NOTE — Patient Instructions (Addendum)
Medication Instructions:  Your physician recommends that you continue on your current medications as directed. Please refer to the Current Medication list given to you today.   Labwork: Lab work to be done today--BMP, CBC, PT  Testing/Procedures: Your physician has requested that you have a cardiac catheterization. Cardiac catheterization is used to diagnose and/or treat various heart conditions. Doctors may recommend this procedure for a number of different reasons. The most common reason is to evaluate chest pain. Chest pain can be a symptom of coronary artery disease (CAD), and cardiac catheterization can show whether plaque is narrowing or blocking your heart's arteries. This procedure is also used to evaluate the valves, as well as measure the blood flow and oxygen levels in different parts of your heart. For further information please visit [email or web address]. Please follow instruction sheet, as given. Scheduled for Oct 13, 2014    Follow-Up: Your physician recommends that you schedule a follow-up appointment in:  About 4-5 weeks with NP or PA     .

## 2014-10-13 ENCOUNTER — Encounter (HOSPITAL_COMMUNITY)
Admission: RE | Disposition: A | Discharge status: Home / Self Care | Payer: Medicare HMO | Source: Ambulatory Visit | Attending: Cardiovascular Disease

## 2014-10-13 ENCOUNTER — Encounter (HOSPITAL_COMMUNITY): Payer: Self-pay | Admitting: General Practice

## 2014-10-13 ENCOUNTER — Ambulatory Visit (HOSPITAL_COMMUNITY)
Admission: RE | Admit: 2014-10-13 | Discharge: 2014-10-14 | Disposition: A | Discharge status: Home / Self Care | Payer: Medicare HMO | Source: Ambulatory Visit | Attending: Cardiovascular Disease | Admitting: Cardiovascular Disease

## 2014-10-13 DIAGNOSIS — I25110 Atherosclerotic heart disease of native coronary artery with unstable angina pectoris: Secondary | ICD-10-CM | POA: Insufficient documentation

## 2014-10-13 DIAGNOSIS — Z7982 Long term (current) use of aspirin: Secondary | ICD-10-CM | POA: Diagnosis not present

## 2014-10-13 DIAGNOSIS — M1990 Unspecified osteoarthritis, unspecified site: Secondary | ICD-10-CM | POA: Insufficient documentation

## 2014-10-13 DIAGNOSIS — E785 Hyperlipidemia, unspecified: Secondary | ICD-10-CM | POA: Diagnosis not present

## 2014-10-13 DIAGNOSIS — K5190 Ulcerative colitis, unspecified, without complications: Secondary | ICD-10-CM | POA: Diagnosis not present

## 2014-10-13 DIAGNOSIS — Z951 Presence of aortocoronary bypass graft: Secondary | ICD-10-CM | POA: Diagnosis not present

## 2014-10-13 DIAGNOSIS — I200 Unstable angina: Secondary | ICD-10-CM | POA: Diagnosis present

## 2014-10-13 DIAGNOSIS — Z9861 Coronary angioplasty status: Secondary | ICD-10-CM | POA: Insufficient documentation

## 2014-10-13 DIAGNOSIS — Z79899 Other long term (current) drug therapy: Secondary | ICD-10-CM | POA: Insufficient documentation

## 2014-10-13 DIAGNOSIS — Z87891 Personal history of nicotine dependence: Secondary | ICD-10-CM | POA: Diagnosis not present

## 2014-10-13 DIAGNOSIS — I10 Essential (primary) hypertension: Secondary | ICD-10-CM | POA: Diagnosis not present

## 2014-10-13 DIAGNOSIS — I209 Angina pectoris, unspecified: Secondary | ICD-10-CM | POA: Diagnosis present

## 2014-10-13 HISTORY — DX: Headache: R51

## 2014-10-13 HISTORY — PX: CORONARY ANGIOPLASTY WITH STENT PLACEMENT: SHX49

## 2014-10-13 HISTORY — PX: CARDIAC CATHETERIZATION: SHX172

## 2014-10-13 HISTORY — DX: Angina pectoris, unspecified: I20.9

## 2014-10-13 LAB — POCT ACTIVATED CLOTTING TIME: Activated Clotting Time: 276 seconds

## 2014-10-13 SURGERY — CORONARY STENT INTERVENTION

## 2014-10-13 MED ORDER — HEPARIN SODIUM (PORCINE) 1000 UNIT/ML IJ SOLN
INTRAMUSCULAR | Status: AC
  Filled 2014-10-13: qty 1

## 2014-10-13 MED ORDER — ACETAMINOPHEN 325 MG PO TABS
650.0000 mg | ORAL_TABLET | ORAL | Status: DC | PRN
  Administered 2014-10-13: 650 mg via ORAL
  Filled 2014-10-13: qty 2

## 2014-10-13 MED ORDER — SODIUM CHLORIDE 0.9 % IV SOLN: 250.0000 mL | INTRAVENOUS | Status: DC | PRN

## 2014-10-13 MED ORDER — ASPIRIN 81 MG PO CHEW: 81.0000 mg | CHEWABLE_TABLET | ORAL | Status: DC

## 2014-10-13 MED ORDER — HEPARIN (PORCINE) IN NACL 2-0.9 UNIT/ML-% IJ SOLN
INTRAMUSCULAR | Status: AC
  Filled 2014-10-13: qty 1000

## 2014-10-13 MED ORDER — SODIUM CHLORIDE 0.9 % IV SOLN
INTRAVENOUS | Status: AC
  Administered 2014-10-13: 09:00:00 via INTRAVENOUS

## 2014-10-13 MED ORDER — SODIUM CHLORIDE 0.9 % IJ SOLN: 3.0000 mL | Freq: Two times a day (BID) | INTRAMUSCULAR | Status: DC

## 2014-10-13 MED ORDER — CLOPIDOGREL BISULFATE 300 MG PO TABS
ORAL_TABLET | ORAL | Status: DC | PRN
  Administered 2014-10-13: 600 mg via ORAL

## 2014-10-13 MED ORDER — CLOPIDOGREL BISULFATE 75 MG PO TABS
75.0000 mg | ORAL_TABLET | Freq: Every day | ORAL | Status: DC
  Administered 2014-10-14: 08:00:00 75 mg via ORAL
  Filled 2014-10-13: qty 1

## 2014-10-13 MED ORDER — LIDOCAINE HCL (PF) 1 % IJ SOLN
INTRAMUSCULAR | Status: AC
  Filled 2014-10-13: qty 30

## 2014-10-13 MED ORDER — CLOPIDOGREL BISULFATE 300 MG PO TABS
ORAL_TABLET | ORAL | Status: AC
  Filled 2014-10-13: qty 2

## 2014-10-13 MED ORDER — HEPARIN (PORCINE) IN NACL 2-0.9 UNIT/ML-% IJ SOLN
INTRAMUSCULAR | Status: AC
  Filled 2014-10-13: qty 500

## 2014-10-13 MED ORDER — FENTANYL CITRATE (PF) 100 MCG/2ML IJ SOLN
INTRAMUSCULAR | Status: AC
  Filled 2014-10-13: qty 2

## 2014-10-13 MED ORDER — VERAPAMIL HCL 2.5 MG/ML IV SOLN
INTRAVENOUS | Status: AC
  Filled 2014-10-13: qty 2

## 2014-10-13 MED ORDER — CARVEDILOL 3.125 MG PO TABS
3.1250 mg | ORAL_TABLET | Freq: Two times a day (BID) | ORAL | Status: DC
  Administered 2014-10-13 – 2014-10-14 (×2): 3.125 mg via ORAL
  Filled 2014-10-13 (×2): qty 1

## 2014-10-13 MED ORDER — NITROGLYCERIN 1 MG/10 ML FOR IR/CATH LAB
INTRA_ARTERIAL | Status: AC
  Filled 2014-10-13: qty 10

## 2014-10-13 MED ORDER — MIDAZOLAM HCL 2 MG/2ML IJ SOLN
INTRAMUSCULAR | Status: AC
  Filled 2014-10-13: qty 2

## 2014-10-13 MED ORDER — ASPIRIN 81 MG PO CHEW
81.0000 mg | CHEWABLE_TABLET | Freq: Every day | ORAL | Status: DC
  Administered 2014-10-14: 09:00:00 81 mg via ORAL
  Filled 2014-10-13: qty 1

## 2014-10-13 MED ORDER — HEPARIN SODIUM (PORCINE) 1000 UNIT/ML IJ SOLN
INTRAMUSCULAR | Status: DC | PRN
  Administered 2014-10-13: 4000 [IU] via INTRAVENOUS
  Administered 2014-10-13: 2000 [IU] via INTRAVENOUS
  Administered 2014-10-13: 6000 [IU] via INTRAVENOUS

## 2014-10-13 MED ORDER — HYDROCHLOROTHIAZIDE 25 MG PO TABS
25.0000 mg | ORAL_TABLET | Freq: Every day | ORAL | Status: DC
  Administered 2014-10-14: 10:00:00 25 mg via ORAL
  Filled 2014-10-13 (×2): qty 1

## 2014-10-13 MED ORDER — BALSALAZIDE DISODIUM 750 MG PO CAPS
750.0000 mg | ORAL_CAPSULE | Freq: Three times a day (TID) | ORAL | Status: DC
  Administered 2014-10-13 – 2014-10-14 (×2): 750 mg via ORAL
  Filled 2014-10-13 (×6): qty 1

## 2014-10-13 MED ORDER — VALSARTAN-HYDROCHLOROTHIAZIDE 160-25 MG PO TABS: 1.0000 | ORAL_TABLET | Freq: Every day | ORAL | Status: DC

## 2014-10-13 MED ORDER — MIDAZOLAM HCL 2 MG/2ML IJ SOLN
INTRAMUSCULAR | Status: DC | PRN
  Administered 2014-10-13: 2 mg via INTRAVENOUS

## 2014-10-13 MED ORDER — VERAPAMIL HCL 2.5 MG/ML IV SOLN
INTRAVENOUS | Status: DC | PRN
  Administered 2014-10-13: 08:00:00 via INTRA_ARTERIAL

## 2014-10-13 MED ORDER — IOHEXOL 350 MG/ML SOLN
INTRAVENOUS | Status: DC | PRN
  Administered 2014-10-13: 50 mL via INTRA_ARTERIAL
  Administered 2014-10-13: 100 mL via INTRA_ARTERIAL

## 2014-10-13 MED ORDER — NITROGLYCERIN 0.4 MG SL SUBL: 0.4000 mg | SUBLINGUAL_TABLET | SUBLINGUAL | Status: DC | PRN

## 2014-10-13 MED ORDER — SODIUM CHLORIDE 0.9 % IV SOLN: INTRAVENOUS | Status: DC

## 2014-10-13 MED ORDER — IRBESARTAN 150 MG PO TABS
150.0000 mg | ORAL_TABLET | Freq: Every day | ORAL | Status: DC
  Administered 2014-10-14: 10:00:00 150 mg via ORAL
  Filled 2014-10-13 (×2): qty 1

## 2014-10-13 MED ORDER — FENTANYL CITRATE (PF) 100 MCG/2ML IJ SOLN
INTRAMUSCULAR | Status: DC | PRN
  Administered 2014-10-13: 25 ug via INTRAVENOUS

## 2014-10-13 MED ORDER — SIMVASTATIN 10 MG PO TABS
10.0000 mg | ORAL_TABLET | Freq: Every day | ORAL | Status: DC
  Administered 2014-10-13: 10 mg via ORAL
  Filled 2014-10-13 (×2): qty 1

## 2014-10-13 MED ORDER — SODIUM CHLORIDE 0.9 % IJ SOLN: 3.0000 mL | INTRAMUSCULAR | Status: DC | PRN

## 2014-10-13 MED ORDER — FLUOXETINE HCL 10 MG PO CAPS
10.0000 mg | ORAL_CAPSULE | Freq: Every day | ORAL | Status: DC
  Administered 2014-10-14: 10 mg via ORAL
  Filled 2014-10-13 (×2): qty 1

## 2014-10-13 MED ORDER — ONDANSETRON HCL 4 MG/2ML IJ SOLN: 4.0000 mg | Freq: Four times a day (QID) | INTRAMUSCULAR | Status: DC | PRN

## 2014-10-13 SURGICAL SUPPLY — 23 items
BALLN EUPHORA RX 2.0X12 (BALLOONS) ×2
BALLN ~~LOC~~ EMERGE MR 2.5X12 (BALLOONS) ×2
BALLOON EUPHORA RX 2.0X12 (BALLOONS) ×1 IMPLANT
BALLOON ~~LOC~~ EMERGE MR 2.5X12 (BALLOONS) ×1 IMPLANT
CATH INFINITI 5 FR IM (CATHETERS) ×2 IMPLANT
CATH INFINITI 5 FR JL3.5 (CATHETERS) IMPLANT
CATH INFINITI 5 FR RCB (CATHETERS) ×2 IMPLANT
CATH INFINITI 5FR ANG PIGTAIL (CATHETERS) IMPLANT
CATH INFINITI 5FR MULTPACK ANG (CATHETERS) ×2 IMPLANT
CATH INFINITI JR4 5F (CATHETERS) IMPLANT
CATH VISTA GUIDE 6FR XB3 (CATHETERS) ×2 IMPLANT
DEVICE RAD COMP TR BAND LRG (VASCULAR PRODUCTS) ×2 IMPLANT
GLIDESHEATH SLEND SS 6F .021 (SHEATH) ×2 IMPLANT
KIT ENCORE 26 ADVANTAGE (KITS) ×2 IMPLANT
KIT HEART LEFT (KITS) ×2 IMPLANT
PACK CARDIAC CATHETERIZATION (CUSTOM PROCEDURE TRAY) ×2 IMPLANT
STENT PROMUS PREM MR 2.5X16 (Permanent Stent) ×2 IMPLANT
SYR MEDRAD MARK V 150ML (SYRINGE) ×2 IMPLANT
TRANSDUCER W/STOPCOCK (MISCELLANEOUS) ×2 IMPLANT
TUBING CIL FLEX 10 FLL-RA (TUBING) ×2 IMPLANT
WIRE COUGAR XT STRL 190CM (WIRE) ×2 IMPLANT
WIRE HI TORQ VERSACORE-J 145CM (WIRE) ×2 IMPLANT
WIRE SAFE-T 1.5MM-J .035X260CM (WIRE) ×2 IMPLANT

## 2014-10-13 NOTE — H&P (View-Only) (Signed)
Chief Complaint  Patient presents with  . 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 She has been followed in the past by Dr. Doreatha Lew. Her last catheterization was in 2002. She was seen by Truitt Merle 12/12/11 with c/o tachycardia. Coreg was added and a stress myoview was arranged. This was a low risk study with probable shifting breast attenuation artifact.  She is here today for follow up. She has been having daily chest pain, described as pressure and worsened with activity. Associated diaphoresis. No SOB. Resolves with NTG. No dizziness, near syncope or syncope. Her sister has Alzheimers dementia so she has been stressed about this.   Primary Care Physician: [name]   Last Lipid Profile:Lipid Panel     Component Value Date/Time   CHOL 149 09/23/2013 0837   TRIG 81.0 09/23/2013 0837   HDL 52.90 09/23/2013 0837   CHOLHDL 3 09/23/2013 0837   VLDL 16.2 09/23/2013 0837   LDLCALC 80 09/23/2013 0837    Past Medical History  Diagnosis Date  . Arthritis   . Hyperlipidemia   . Hypertension   . Ulcerative colitis   . CAD (coronary artery disease)     2V CABG 1996 (LIMA to LAD, SVG to RCA) Dr. Servando Snare.; last stress test in 2010  . Adenomatous colon polyp 11/2005  . Muscle cramps     and pains    Past Surgical History  Procedure Laterality Date  . Colonoscopy    . Polypectomy    . Upper gastrointestinal endoscopy    . Coronary artery bypass graft  1996    x 2  . Back surgery      lumbar fusion 25 years ago and ruptured disk 10 years ago same area    Current Outpatient Prescriptions  Medication Sig Dispense Refill  . aspirin 81 MG tablet Take 81 mg by mouth daily.      . balsalazide (COLAZAL) 750 MG capsule Take one tablet by mouth three times a day 90 capsule 10  . carvedilol (COREG) 3.125  MG tablet TAKE 1 TABLET (3.125 MG TOTAL) BY MOUTH 2 (TWO) TIMES DAILY. 180 tablet 0  . cholecalciferol (VITAMIN D) 1000 UNITS tablet Take 1,000 Units by mouth daily.      Marland Kitchen FLUoxetine (PROZAC) 10 MG capsule Take 10 mg by mouth daily.     Marland Kitchen LORazepam (ATIVAN) 0.5 MG tablet Take 0.5 mg by mouth as needed for anxiety (for anxiety).     . Multiple Vitamins-Minerals (MULTIVITAMIN WITH MINERALS) tablet Take 1 tablet by mouth daily.      . simvastatin (ZOCOR) 10 MG tablet Take 1 tablet (10 mg total) by mouth daily. 90 tablet 3  . valsartan-hydrochlorothiazide (DIOVAN-HCT) 160-25 MG per tablet TAKE 1 TABLET BY MOUTH DAILY. 90 tablet 3  . nitroGLYCERIN (NITROSTAT) 0.4 MG SL tablet Place 1 tablet (0.4 mg total) under the tongue every 5 (five) minutes as needed for chest pain. 25 tablet 6   No current facility-administered medications for this visit.    No Known Allergies  History   Social History  . Marital Status: Married    Spouse Name: N/A  . Number of Children: N/A  . Years of Education: N/A   Occupational History  . Not on file.   Social History Main Topics  . Smoking status: Former  Smoker    Quit date: 10/04/1994  . Smokeless tobacco: Never Used  . Alcohol Use: No  . Drug Use: No  . Sexual Activity: Not Currently   Other Topics Concern  . Not on file   Social History Narrative    Family History  Problem Relation Age of Onset  . Lung cancer Brother   . Breast cancer Sister   . Kidney disease Brother     renal cancer  . Diabetes Mother   . Heart disease Mother   . Heart attack Mother 15  . Heart disease Sister   . Colon cancer Daughter     Review of Systems:  As stated in the HPI and otherwise negative.   BP 130/64 mmHg  Pulse 73  Ht 5' 5.5" (1.664 m)  Wt 182 lb 12.8 oz (82.918 kg)  BMI 29.95 kg/m2  Physical Examination: General: Well developed, well nourished, NAD HEENT: OP clear, mucus membranes moist SKIN: warm, dry. No rashes. Neuro: No focal  deficits Musculoskeletal: Muscle strength 5/5 all ext Psychiatric: Mood and affect normal Neck: No JVD, no carotid bruits, no thyromegaly, no lymphadenopathy. Lungs:Clear bilaterally, no wheezes, rhonci, crackles Cardiovascular: Regular rate and rhythm. No murmurs, gallops or rubs. Abdomen:Soft. Bowel sounds present. Non-tender.  Extremities: No lower extremity edema. Pulses are 2 + in the bilateral DP/PT.  EKG:  EKG is ordered today. The ekg ordered today demonstrates: NSR, rate 73 bpm. Poor R wave progression septal leads.   Recent Labs: No results found for requested labs within last 365 days.   Lipid Panel    Component Value Date/Time   CHOL 149 09/23/2013 0837   TRIG 81.0 09/23/2013 0837   HDL 52.90 09/23/2013 0837   CHOLHDL 3 09/23/2013 0837   VLDL 16.2 09/23/2013 0837   LDLCALC 80 09/23/2013 0837     Wt Readings from Last 3 Encounters:  10/10/14 182 lb 12.8 oz (82.918 kg)  06/08/14 179 lb (81.194 kg)  02/03/14 183 lb (83.008 kg)     Other studies Reviewed: Additional studies/ records that were reviewed today include:  Review of the above records demonstrates:    Assessment and Plan:   1. CAD/unstable angina: Recent chest pain concerning for unstable angina. She has not had a cath since 2002. 2V CABG in 1996. Will arrange cardiac cath with possible PCI on Monday Oct 13, 2014. Risks and benefits of procedure reviewed with the patient. Pre-cath labs today.   2. HTN: BP is stable. No changes.   3. Hyperlipidemia: Lipids have been well controlled on a statin. No changes  Current medicines are reviewed at length with the patient today.  The patient does not have concerns regarding medicines.  The following changes have been made:  no change  Labs/ tests ordered today include:Cardiac cath No orders of the defined types were placed in this encounter.    Disposition:   FU with 6 in 4 weeks  Signed, Lauree Chandler, MD 10/10/2014 9:00 AM    [address], [place], [place]  [address] Phone: [phone]; Fax: [phone]

## 2014-10-13 NOTE — CV Procedure (Signed)
      Cardiac Catheterization Operative Report  [name] [name] [id number] 5/2/20169:14 AM Reginia Naas, MD  Procedure Performed:  1. Left Heart Catheterization 2. Selective Coronary Angiography 3. SVG angiography 4. LIMA graft angiography 5. Left ventricular angiogram 6. PTCA/DES x 1 mid Circumflex  Operator: Lauree Chandler, MD  Arterial access site:  Left radial artery.   Indication: 69 yo female with history of CAD s/p CABG with unstable angina.                              Procedure Details: The risks, benefits, complications, treatment options, and expected outcomes were discussed with the patient. The patient and/or family concurred with the proposed plan, giving informed consent. The patient was brought to the cath lab after IV hydration was begun and oral premedication was given. The patient was further sedated with Versed and Fentanyl. The left wrist was assessed with a modified Allens test which was positive. The left wrist was prepped and draped in a sterile fashion. 1% lidocaine was used for local anesthesia. Using the modified Seldinger access technique, a 5 French sheath was placed in the left radial artery. 3 mg Verapamil was given through the sheath. 4000 units IV heparin was given. Standard diagnostic catheters were used to perform selective coronary angiography. A pigtail catheter was used to perform a left ventricular angiogram.   PCI Note: The patient was given 6000 units additional IV heparin. She was given Plavix 600 mg po x 1. ACT was over 270. I then engaged the left main with a XB 3.0 guiding catheter. A Cougar IC wire was advanced down the Circumflex. A 2.0 x 12 mm balloon was used to pre-dilate the stenosis in the mid vessel. A 2.5 x 16 mm Promus Premier DES was deployed in the mid vessel. The stent was post-dilated with a 2.5 x 12 mm Langley balloon x 1. The stenosis was taken from 99% down to 0%. There was excellent flow down the vessel.   The sheath  was removed from the right radial artery and a Terumo hemostasis band was applied at the arteriotomy site on the right wrist. There were no immediate complications. The patient was taken to the recovery area in stable condition.   Hemodynamic Findings: Central aortic pressure: 122/66 Left ventricular pressure: 120/10/20  Angiographic Findings:  Left main: Diffuse 20% stenosis.   Left Anterior Descending Artery: Large caliber vessel that courses to the apex. The proximal vessel is occluded. The mid and distal vessel fills from the patent graft.   Circumflex Artery: Moderate caliber vessel with moderate caliber obtuse marginal branch. The mid vessel has a 99% stenosis.   Right Coronary Artery: Large dominant vessel with 100% mid occlusion. The distal vessel fills from the patent vein graft.   Graft Anatomy:  SVG to PDA is patent LIMA to mid LAD is patent  Left Ventricular Angiogram: LVEF=60-65%.   Impression: 1. 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  2. Severe stenosis mid Circumflex felt to be the culprit lesion.  3. Successful PTCA/DES x 1 mid Circumflex 4. Normal LV systolic function  Recommendations: Will continue ASA and Plavix for one year.        Complications:  None. The patient tolerated the procedure well.

## 2014-10-13 NOTE — Progress Notes (Signed)
TR BAND REMOVAL  LOCATION:  left radial  DEFLATED PER PROTOCOL:  Yes.    TIME BAND OFF / DRESSING APPLIED:   1715   SITE UPON ARRIVAL:   Level 0  SITE AFTER BAND REMOVAL:  Level 1  REVERSE ALLEN'S TEST:    positive  CIRCULATION SENSATION AND MOVEMENT:  Within Normal Limits  Yes.    COMMENTS:  Persistent oozing; re-inflated twice and deflated conservatively.

## 2014-10-13 NOTE — Interval H&P Note (Signed)
History and Physical Interval Note:  10/13/2014 7:20 AM  [name] [name]  has presented today for cardiac cath with the diagnosis of unstable angina. The various methods of treatment have been discussed with the patient and family. After consideration of risks, benefits and other options for treatment, the patient has consented to  Procedure(s): Left Heart Cath And Coronary Angiography (N/A) as a surgical intervention .  The patient's history has been reviewed, patient examined, no change in status, stable for surgery.  I have reviewed the patient's chart and labs.  Questions were answered to the patient's satisfaction.    Cath Lab Visit (complete for each Cath Lab visit)  Clinical Evaluation Leading to the Procedure:   ACS: No.  Non-ACS:    Anginal Classification: CCS III  Anti-ischemic medical therapy: Maximal Therapy (2 or more classes of medications)  Non-Invasive Test Results: No non-invasive testing performed  Prior CABG: Previous CABG         [name] [name]

## 2014-10-14 ENCOUNTER — Encounter (HOSPITAL_COMMUNITY): Payer: Self-pay | Admitting: Cardiovascular Disease

## 2014-10-14 DIAGNOSIS — K5190 Ulcerative colitis, unspecified, without complications: Secondary | ICD-10-CM | POA: Diagnosis not present

## 2014-10-14 DIAGNOSIS — I200 Unstable angina: Secondary | ICD-10-CM

## 2014-10-14 DIAGNOSIS — E785 Hyperlipidemia, unspecified: Secondary | ICD-10-CM | POA: Diagnosis not present

## 2014-10-14 DIAGNOSIS — I25110 Atherosclerotic heart disease of native coronary artery with unstable angina pectoris: Secondary | ICD-10-CM | POA: Diagnosis not present

## 2014-10-14 DIAGNOSIS — I10 Essential (primary) hypertension: Secondary | ICD-10-CM | POA: Diagnosis not present

## 2014-10-14 LAB — BASIC METABOLIC PANEL
Anion gap: 12 (ref 5–15)
BUN: 10 mg/dL (ref 6–20)
CO2: 25 mmol/L (ref 22–32)
Calcium: 9.1 mg/dL (ref 8.9–10.3)
Chloride: 102 mmol/L (ref 101–111)
Creatinine, Ser: 0.82 mg/dL (ref 0.44–1.00)
GFR calc Af Amer: >60 mL/min (ref >60)
GFR calc non Af Amer: >60 mL/min (ref >60)
Glucose, Bld: 121 mg/dL — ABNORMAL HIGH (ref 70–99)
Potassium: 4 mmol/L (ref 3.5–5.1)
Sodium: 139 mmol/L (ref 135–145)

## 2014-10-14 LAB — CBC
HCT: 36.5 % (ref 36.0–46.0)
Hemoglobin: 12 g/dL (ref 12.0–15.0)
MCH: 30.6 pg (ref 26.0–34.0)
MCHC: 32.9 g/dL (ref 30.0–36.0)
MCV: 93.1 fL (ref 78.0–100.0)
Platelets: 195 10*3/uL (ref 150–400)
RBC: 3.92 MIL/uL (ref 3.87–5.11)
RDW: 13.4 % (ref 11.5–15.5)
WBC: 6.7 10*3/uL (ref 4.0–10.5)

## 2014-10-14 MED ORDER — CLOPIDOGREL BISULFATE 75 MG PO TABS: 75.0000 mg | ORAL_TABLET | Freq: Every day | ORAL | Status: DC

## 2014-10-14 NOTE — Progress Notes (Signed)
CARDIAC REHAB PHASE I   PRE:  Rate/Rhythm: 78 SR  BP:  Supine: 121/48  Sitting:   Standing:    SaO2:   MODE:  Ambulation: 1000 ft   POST:  Rate/Rhythm: 95 SR  BP:  Supine:   Sitting: 139/54  Standing:    SaO2:  1314-3888 Pt walked 1000 ft with steady gait and pt was happy that chest pressure gone. Education completed with pt and husband . Stressed importance of plavix with stent. Pt had attended CRP 2 twenty years ago and did not want to do again as she stated prefers walking on her own.    Graylon Good, RN BSN  10/14/2014 8:49 AM

## 2014-10-14 NOTE — Discharge Instructions (Signed)
PLEASE REMEMBER TO BRING ALL OF YOUR MEDICATIONS TO EACH OF YOUR FOLLOW-UP OFFICE VISITS. ° °PLEASE ATTEND ALL SCHEDULED FOLLOW-UP APPOINTMENTS.  ° °Activity: Increase activity slowly as tolerated. You may shower, but no soaking baths (or swimming) for 1 week. No driving for 2 days. No lifting over 5 lbs for 1 week. No sexual activity for 1 week.  ° °You May Return to Work: in 1 week (if applicable) ° °Wound Care: You may wash cath site gently with soap and water. Keep cath site clean and dry. If you notice pain, swelling, bleeding or pus at your cath site, please call [phone]. ° ° ° °Cardiac Cath Site Care °Refer to this sheet in the next few weeks. These instructions provide you with information on caring for yourself after your procedure. Your caregiver may also give you more specific instructions. Your treatment has been planned according to current medical practices, but problems sometimes occur. Call your caregiver if you have any problems or questions after your procedure. °HOME CARE INSTRUCTIONS °· You may shower 24 hours after the procedure. Remove the bandage (dressing) and gently wash the site with plain soap and water. Gently pat the site dry.  °· Do not apply powder or lotion to the site.  °· Do not sit in a bathtub, swimming pool, or whirlpool for 5 to 7 days.  °· No bending, squatting, or lifting anything over 10 pounds (4.5 kg) as directed by your caregiver.  °· Inspect the site at least twice daily.  °· Do not drive home if you are discharged the same day of the procedure. Have someone else drive you.  °· You may drive 24 hours after the procedure unless otherwise instructed by your caregiver.  °What to expect: °· Any bruising will usually fade within 1 to 2 weeks.  °· Blood that collects in the tissue (hematoma) may be painful to the touch. It should usually decrease in size and tenderness within 1 to 2 weeks.  °SEEK IMMEDIATE MEDICAL CARE IF: °· You have unusual pain at the site or down the  affected limb.  °· You have redness, warmth, swelling, or pain at the site.  °· You have drainage (other than a small amount of blood on the dressing).  °· You have chills.  °· You have a fever or persistent symptoms for more than 72 hours.  °· You have a fever and your symptoms suddenly get worse.  °· Your leg becomes pale, cool, tingly, or numb.  °· You have heavy bleeding from the site. Hold pressure on the site.  °Document Released: 07/02/2010 Document Revised: 05/19/2011 Document Reviewed: 07/02/2010 °ExitCare® Patient Information ©2012 [employer]. ° °

## 2014-10-14 NOTE — Progress Notes (Signed)
     Patient Name: EVVIE BEHRMANN Date of Encounter: 10/14/2014  Active Problems:   Unstable angina   Primary Cardiologist: Dr Angelena Form  Patient Profile: 69 yo female w/ hx MI '96 w/ CABG, HL, HTN, ulcerative colitis, admitted 05/02 for OP cath 2nd [facility].  SUBJECTIVE: No chest pain or SOB overnight  OBJECTIVE Filed Vitals:   10/13/14 1820 10/13/14 2039 10/14/14 0029 10/14/14 0607  BP: 122/51 136/46 112/61 128/57  Pulse:  73 74 76  Temp:  98.6 F (37 C) 98.2 F (36.8 C) 97.8 F (36.6 C)  TempSrc:  Oral Oral Oral  Resp: 14 21 13 20   Height:      Weight:   180 lb 12.4 oz (82 kg)   SpO2:  97% 96% 94%    Intake/Output Summary (Last 24 hours) at 10/14/14 0634 Last data filed at 10/14/14 3403  Gross per 24 hour  Intake   1200 ml  Output   1900 ml  Net   -700 ml   Filed Weights   10/13/14 0555 10/14/14 0029  Weight: 180 lb (81.647 kg) 180 lb 12.4 oz (82 kg)    PHYSICAL EXAM General: Well developed, well nourished, female in no acute distress. Head: Normocephalic, atraumatic.  Neck: Supple without bruits, JVD not elevated. Lungs:  Resp regular and unlabored, decreased BS bases, few rales. Heart: RRR, S1, S2, no S3, S4, or murmur; no rub. Abdomen: Soft, non-tender, non-distended, BS + x 4.  Extremities: No clubbing, cyanosis, edema. Left radial cath site w/ some ecchymosis, no hematoma Neuro: Alert and oriented X 3. Moves all extremities spontaneously. Psych: Normal affect.  LABS: CBC: Recent Labs  10/14/14 0554  WBC 6.7  HGB 12.0  HCT 36.5  MCV 93.1  PLT 195   TELE:  SR, no sig ectopy      ECG: SR, inferior T wave inversions, different from previous ECGs  Current Medications:  . aspirin  81 mg Oral Daily  . balsalazide  750 mg Oral TID  . carvedilol  3.125 mg Oral BID WC  . clopidogrel  75 mg Oral Q breakfast  . FLUoxetine  10 mg Oral Daily  . hydrochlorothiazide  25 mg Oral Daily  . irbesartan  150 mg Oral Daily  . simvastatin  10 mg Oral Daily  .  sodium chloride  3 mL Intravenous Q12H      ASSESSMENT AND PLAN: Active Problems:   Unstable angina - s/p DES CFX - patent grafts, no critical distal disease - EF 60-65% - ECG is different, but no chest pain overnight - MD advise if further eval needed, o/w stable for d/c.  Augusto Garbe 6:34 AM 10/14/2014  Patient seen and examined and history reviewed. Agree with above findings and plan. No chest pain or SOB. VSS. Lungs clear. Still awaiting BMET. Ecg shows minor T wave inversion in 3 and Avf. Patient is stable for DC today on ASA and Plavix for one year.   Peter Martinique, [place] 10/14/2014 7:14 AM

## 2014-10-14 NOTE — Discharge Summary (Signed)
CARDIOLOGY DISCHARGE SUMMARY   Patient ID: [name] [name] MRN: [id number] DOB/AGE: Mar 06, 1946 69 y.o.  Admit date: 10/13/2014 Discharge date: 10/14/2014  PCP: Reginia Naas, MD Primary Cardiologist: Dr Angelena Form  Primary Discharge Diagnosis:     Unstable angina  Procedure Performed:  1. Left Heart Catheterization 2. Selective Coronary Angiography 3. SVG angiography 4. LIMA graft angiography 5. Left ventricular angiogram 6. PTCA/DES x 1 mid Circumflex  Hospital Course: [name] [name] is a 69 y.o. female with a history of CAD, MI '96 w/ CABG, HL, HTN, and ulcerative colitis. She was seen in the office complaining of chest pain, and scheduled for cath. She came to the hospital for the procedure on 10/13/2014.   The cardiac cath results are below. Both grafts (LIMA-LAD & SVG-PDA) are patent and no critical distal disease. The CFX had a 99% stenosis, treated with DES.   She tolerated the procedure well.   On 05/03, she was seen by Dr Martinique and all data were reviewed. She was ambulating without chest pain or SOB. Her Left radial cath site had a small amount of ecchymosis, no hematoma. Her post-procedure labs were stable. No further inpatient workup was indicated and she is considered stable for discharge, to follow up as an outpatient.   Labs:   Lab Results  Component Value Date   WBC 6.7 10/14/2014   HGB 12.0 10/14/2014   HCT 36.5 10/14/2014   MCV 93.1 10/14/2014   PLT 195 10/14/2014     Recent Labs Lab 10/14/14 0554  NA 139  K 4.0  CL 102  CO2 25  BUN 10  CREATININE 0.82  CALCIUM 9.1  GLUCOSE 121*   Cardiac Cath: 10/13/2014 Hemodynamic Findings: Central aortic pressure: 122/66 Left ventricular pressure: 120/10/20 Angiographic Findings: Left main: Diffuse 20% stenosis.  Left Anterior Descending Artery: Large caliber vessel that courses to the apex. The proximal vessel is occluded. The mid and distal vessel fills from the patent graft.  Circumflex  Artery: Moderate caliber vessel with moderate caliber obtuse marginal branch. The mid vessel has a 99% stenosis.  Right Coronary Artery: Large dominant vessel with 100% mid occlusion. The distal vessel fills from the patent vein graft.  Graft Anatomy:  SVG to PDA is patent LIMA to mid LAD is patent Left Ventricular Angiogram: LVEF=60-65%.  Impression: 1. 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  2. Severe stenosis mid Circumflex felt to be the culprit lesion.  3. Successful PTCA/DES x 1 mid Circumflex 4. Normal LV systolic function Recommendations: Will continue ASA and Plavix for one year.   EKG: SR, T wave inversions III/AVF, different from previous ECGs   FOLLOW UP PLANS AND APPOINTMENTS No Known Allergies   Medication List    TAKE these medications        aspirin 81 MG tablet  Take 81 mg by mouth daily.     balsalazide 750 MG capsule  Commonly known as:  COLAZAL  Take one tablet by mouth three times a day     carvedilol 3.125 MG tablet  Commonly known as:  COREG  TAKE 1 TABLET (3.125 MG TOTAL) BY MOUTH 2 (TWO) TIMES DAILY.     cholecalciferol 1000 UNITS tablet  Commonly known as:  VITAMIN D  Take 1,000 Units by mouth daily.     clopidogrel 75 MG tablet  Commonly known as:  PLAVIX  Take 1 tablet (75 mg total) by mouth daily with breakfast.     FLUoxetine  10 MG capsule  Commonly known as:  PROZAC  Take 10 mg by mouth daily.     LORazepam 0.5 MG tablet  Commonly known as:  ATIVAN  Take 0.5 mg by mouth as needed for anxiety (for anxiety).     multivitamin with minerals tablet  Take 1 tablet by mouth daily.     nitroGLYCERIN 0.4 MG SL tablet  Commonly known as:  NITROSTAT  Place 1 tablet (0.4 mg total) under the tongue every 5 (five) minutes as needed for chest pain.     simvastatin 10 MG tablet  Commonly known as:  ZOCOR  Take 1 tablet (10 mg total) by mouth daily.     valsartan-hydrochlorothiazide 160-25 MG per tablet    Commonly known as:  DIOVAN-HCT  TAKE 1 TABLET BY MOUTH DAILY.        Discharge Instructions    Diet - low sodium heart healthy    Complete by:  As directed      Increase activity slowly    Complete by:  As directed           Follow-up Information    Follow up with Lauree Chandler, MD.   Specialty:  Cardiology   Why:  The office will call.   Contact information:   [address] [place] [address] [phone]       BRING ALL MEDICATIONS WITH YOU TO FOLLOW UP APPOINTMENTS  Time spent with patient to include physician time: 41 min Signed: Rosaria Ferries, PA-C 10/14/2014, 8:34 AM Co-Sign MD

## 2014-10-21 MED FILL — Lidocaine HCl Local Preservative Free (PF) Inj 1%: INTRAMUSCULAR | Qty: 30 | Status: AC

## 2014-10-21 MED FILL — Heparin Sodium (Porcine) 2 Unit/ML in Sodium Chloride 0.9%: INTRAMUSCULAR | Qty: 1000 | Status: AC

## 2014-11-08 ENCOUNTER — Other Ambulatory Visit: Payer: Self-pay | Admitting: Cardiovascular Disease

## 2014-11-11 NOTE — Telephone Encounter (Signed)
Per note 10.6.15

## 2014-11-18 ENCOUNTER — Encounter: Payer: Self-pay | Admitting: Nurse Practitioner

## 2014-11-18 ENCOUNTER — Ambulatory Visit (INDEPENDENT_AMBULATORY_CARE_PROVIDER_SITE_OTHER): Payer: Medicare HMO | Admitting: Nurse Practitioner

## 2014-11-18 VITALS — BP 130/68 | HR 73 | Ht 66.0 in | Wt 181.1 lb

## 2014-11-18 DIAGNOSIS — E785 Hyperlipidemia, unspecified: Secondary | ICD-10-CM

## 2014-11-18 DIAGNOSIS — I10 Essential (primary) hypertension: Secondary | ICD-10-CM

## 2014-11-18 DIAGNOSIS — Z955 Presence of coronary angioplasty implant and graft: Secondary | ICD-10-CM

## 2014-11-18 LAB — BASIC METABOLIC PANEL
BUN: 15 mg/dL (ref 6–23)
CO2: 31 mEq/L (ref 19–32)
Calcium: 9.8 mg/dL (ref 8.4–10.5)
Chloride: 100 mEq/L (ref 96–112)
Creatinine, Ser: 0.83 mg/dL (ref 0.40–1.20)
GFR: 72.57 mL/min (ref >60.00)
Glucose, Bld: 94 mg/dL (ref 70–99)
Potassium: 3.9 mEq/L (ref 3.5–5.1)
Sodium: 138 mEq/L (ref 135–145)

## 2014-11-18 LAB — CBC
HCT: 38.6 % (ref 36.0–46.0)
Hemoglobin: 12.9 g/dL (ref 12.0–15.0)
MCHC: 33.5 g/dL (ref 30.0–36.0)
MCV: 91.5 fl (ref 78.0–100.0)
Platelets: 259 10*3/uL (ref 150.0–400.0)
RBC: 4.22 Mil/uL (ref 3.87–5.11)
RDW: 13.9 % (ref 11.5–15.5)
WBC: 6.2 10*3/uL (ref 4.0–10.5)

## 2014-11-18 NOTE — Progress Notes (Signed)
CARDIOLOGY OFFICE NOTE  Date:  11/18/2014    [name] [name] Date of Birth: 07-Jul-1945 Medical Record #[id number]  PCP:  Reginia Naas, MD  Cardiologist:  [facility]    Chief Complaint  Patient presents with  . Post PCI    Seen for Dr. Angelena Form    History of Present Illness: [name] [name] is a 69 y.o. female who presents today for a post PCI/cath visit. Seen for Dr. Angelena Form. She has a h/o acute anterior MI April 1996 with angioplasty, CABG June 1996 (LIMA to LAD, SVG to RCA), HLD, HTN, & ulcerative colitis.  She has been followed in the past by Dr. Doreatha Lew.  She was seen by Truitt Merle 12/12/11 with c/o tachycardia. Coreg was added and a stress myoview was arranged. This was a low risk study with probable shifting breast attenuation artifact.  Last seen at the end of April - endorsed chest pain/angina and was referred for repeat cardiac cath. The LCX had a 99% stenosis which was treated with DES. She is committed to [facility] for one year.   Comes in today. Here alone. Doing well. Little "jittery" since her PCI but no chest pain/pressure. Breathing ok. Cath site ok. Tolerating medicines without issue. Now on Plavix. No problems noted. She is overall happy with how she is now doing.   Past Medical History  Diagnosis Date  . Arthritis   . Hyperlipidemia   . Hypertension   . Ulcerative colitis   . CAD (coronary artery disease)     2V CABG 1996 (LIMA to LAD, SVG to RCA) Dr. Servando Snare.; last stress test in 2010  . Adenomatous colon polyp 11/2005  . Muscle cramps     and pains  . Anginal pain   . Myocardial infarction   . Headache     Past Surgical History  Procedure Laterality Date  . Colonoscopy    . Polypectomy    . Upper gastrointestinal endoscopy    . Coronary artery bypass graft  1996    x 2  . Back surgery      lumbar fusion 25 years ago and ruptured disk 10 years ago same area  . Coronary angioplasty with stent placement  10/13/2014    DES to [place]    . Cardiac catheterization N/A 10/13/2014    Procedure: Left Heart Cath And Coronary Angiography;  Surgeon: Burnell Blanks, MD;  Location: [facility] INVASIVE CV LAB CUPID;  Service: Cardiovascular;  Laterality: N/A;  . Cardiac catheterization N/A 10/13/2014    Procedure: Coronary Stent Intervention;  Surgeon: Burnell Blanks, MD;  Location: [place] INVASIVE CV LAB CUPID;  Service: Cardiovascular;  Laterality: N/A;     Medications: Current Outpatient Prescriptions  Medication Sig Dispense Refill  . aspirin 81 MG tablet Take 81 mg by mouth daily.      . balsalazide (COLAZAL) 750 MG capsule Take one tablet by mouth three times a day 90 capsule 10  . carvedilol (COREG) 3.125 MG tablet TAKE 1 TABLET (3.125 MG TOTAL) BY MOUTH 2 (TWO) TIMES DAILY. 180 tablet 0  . cholecalciferol (VITAMIN D) 1000 UNITS tablet Take 1,000 Units by mouth daily.      . clopidogrel (PLAVIX) 75 MG tablet Take 1 tablet (75 mg total) by mouth daily with breakfast. 30 tablet 11  . FLUoxetine (PROZAC) 10 MG capsule Take 10 mg by mouth daily.     Marland Kitchen LORazepam (ATIVAN) 0.5 MG tablet Take 0.5 mg by mouth as needed for anxiety (for anxiety).     Marland Kitchen  Multiple Vitamins-Minerals (MULTIVITAMIN WITH MINERALS) tablet Take 1 tablet by mouth daily.      . nitroGLYCERIN (NITROSTAT) 0.4 MG SL tablet Place 1 tablet (0.4 mg total) under the tongue every 5 (five) minutes as needed for chest pain. 25 tablet 6  . simvastatin (ZOCOR) 10 MG tablet Take 1 tablet (10 mg total) by mouth daily. 90 tablet 3  . valsartan-hydrochlorothiazide (DIOVAN-HCT) 160-25 MG per tablet TAKE 1 TABLET BY MOUTH DAILY. 90 tablet 1   No current facility-administered medications for this visit.    Allergies: No Known Allergies  Social History: The patient  reports that she quit smoking about 20 years ago. She has never used smokeless tobacco. She reports that she does not drink alcohol or use illicit drugs.   Family History: The patient's family history includes  Breast cancer in her sister; Colon cancer in her daughter; Diabetes in her mother; Heart attack (age of onset: 95) in her mother; Heart disease in her mother and sister; Kidney disease in her brother; Lung cancer in her brother.   Review of Systems: Please see the history of present illness.   Otherwise, the review of systems is positive for none.   All other systems are reviewed and negative.   Physical Exam: VS:  BP 130/68 mmHg  Pulse 73  Ht 5\' 6"  (1.676 m)  Wt 181 lb 1.9 oz (82.155 kg)  BMI 29.25 kg/m2  SpO2 100% .  BMI Body mass index is 29.25 kg/(m^2).  Wt Readings from Last 3 Encounters:  11/18/14 181 lb 1.9 oz (82.155 kg)  10/10/14 182 lb 12.8 oz (82.918 kg)  06/08/14 179 lb (81.194 kg)    General: Pleasant. Well developed, well nourished and in no acute distress.  HEENT: Normal. Neck: Supple, no JVD, carotid bruits, or masses noted.  Cardiac: Regular rate and rhythm. No murmurs, rubs, or gallops. No edema.  Respiratory:  Lungs are clear to auscultation bilaterally with normal work of breathing.  GI: Soft and nontender.  MS: No deformity or atrophy. Gait and ROM intact. Skin: Warm and dry. Color is normal.  Neuro:  Strength and sensation are intact and no gross focal deficits noted.  Psych: Alert, appropriate and with normal affect.   LABORATORY DATA:  EKG:  EKG is not ordered today.   Lab Results  Component Value Date   WBC 6.7 10/14/2014   HGB 12.0 10/14/2014   HCT 36.5 10/14/2014   PLT 195 10/14/2014   GLUCOSE 121* 10/14/2014   CHOL 149 09/23/2013   TRIG 81.0 09/23/2013   HDL 52.90 09/23/2013   LDLCALC 80 09/23/2013   ALT 19 09/23/2013   AST 23 09/23/2013   NA 139 10/14/2014   K 4.0 10/14/2014   CL 102 10/14/2014   CREATININE 0.82 10/14/2014   BUN 10 10/14/2014   CO2 25 10/14/2014   TSH 0.40 12/12/2011   INR 1.0 10/10/2014    BNP (last 3 results) No results for input(s): BNP in the last 8760 hours.  ProBNP (last 3 results) No results for  input(s): PROBNP in the last 8760 hours.   Other Studies Reviewed Today: Cardiac Cath: 10/13/2014 Hemodynamic Findings: Central aortic pressure: 122/66 Left ventricular pressure: 120/10/20 Angiographic Findings: Left main: Diffuse 20% stenosis.  Left Anterior Descending Artery: Large caliber vessel that courses to the apex. The proximal vessel is occluded. The mid and distal vessel fills from the patent graft.  Circumflex Artery: Moderate caliber vessel with moderate caliber obtuse marginal branch. The mid vessel has a 99%  stenosis.  Right Coronary Artery: Large dominant vessel with 100% mid occlusion. The distal vessel fills from the patent vein graft.  Graft Anatomy:  SVG to PDA is patent LIMA to mid LAD is patent Left Ventricular Angiogram: LVEF=60-65%.  Impression: 1. 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  2. Severe stenosis mid Circumflex felt to be the culprit lesion.  3. Successful PTCA/DES x 1 mid Circumflex 4. Normal LV systolic function Recommendations: Will continue ASA and Plavix for one year.    Assessment/Plan:  1. CAD/remote CABG - s/p PCI to LCX. Has normal LV function. Needs follow up surveillance labs today. Continue with CV risk factor modification.   2. HTN: BP is stable. No changes.   3. Hyperlipidemia: Lipids have been well controlled on a statin. No changes  Current medicines are reviewed with the patient today.  The patient does not have concerns regarding medicines other than what has been noted above.  The following changes have been made:  See above.  Labs/ tests ordered today include:   No orders of the defined types were placed in this encounter.     Disposition:   FU with Dr. Angelena Form in 3 to 4 months.   Patient is agreeable to this plan and will call if any problems develop in the interim.   Signed: Burtis Junes, RN, ANP-C 11/18/2014 11:10 AM  [facility] [address] [place], [place]  [address] Phone: [phone] Fax: [phone]

## 2014-11-18 NOTE — Patient Instructions (Addendum)
We will be checking the following labs today - BMET & CBC   Medication Instructions:    Continue with your current medicines.     Testing/Procedures To Be Arranged:  N/A  Follow-Up:   See Dr. Angelena Form in 3 to 4 months    Other Special Instructions:   Walking every day  Call the [place] office at [phone] if you have any questions, problems or concerns.

## 2014-12-29 ENCOUNTER — Other Ambulatory Visit: Payer: Self-pay | Admitting: Cardiovascular Disease

## 2015-03-09 ENCOUNTER — Ambulatory Visit: Payer: Medicare HMO | Admitting: Cardiovascular Disease

## 2015-03-23 NOTE — Progress Notes (Signed)
Chief Complaint  Patient presents with  . 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 She has been followed in the past by Dr. Doreatha Lew. Last cath May 2016. She was found to have a severe stenosis in the mid Circumflex treated with a 2.5 x 16 mm Promus Premier DES. The LIMA was patent to the LAD and the SVG was patent to the PDA.   She is here today for follow up. She has been doing well. No chest pain or SOB.   Primary Care Physician: [name]   Past Medical History  Diagnosis Date  . Arthritis   . Hyperlipidemia   . Hypertension   . Ulcerative colitis   . CAD (coronary artery disease)     2V CABG 1996 (LIMA to LAD, SVG to RCA) Dr. Servando Snare.; last stress test in 2010  . Adenomatous colon polyp 11/2005  . Muscle cramps     and pains  . Anginal pain ([place])   . Myocardial infarction ([place])   . Headache     Past Surgical History  Procedure Laterality Date  . Colonoscopy    . Polypectomy    . Upper gastrointestinal endoscopy    . Coronary artery bypass graft  1996    x 2  . Back surgery      lumbar fusion 25 years ago and ruptured disk 10 years ago same area  . Coronary angioplasty with stent placement  10/13/2014    DES to [place]  . Cardiac catheterization N/A 10/13/2014    Procedure: Left Heart Cath And Coronary Angiography;  Surgeon: Burnell Blanks, MD;  Location: [facility] INVASIVE CV LAB CUPID;  Service: Cardiovascular;  Laterality: N/A;  . Cardiac catheterization N/A 10/13/2014    Procedure: Coronary Stent Intervention;  Surgeon: Burnell Blanks, MD;  Location: [facility] INVASIVE CV LAB CUPID;  Service: Cardiovascular;  Laterality: N/A;    Current Outpatient Prescriptions  Medication Sig Dispense Refill  . aspirin 81 MG tablet Take 81 mg by mouth daily.      . balsalazide (COLAZAL) 750  MG capsule Take one tablet by mouth three times a day 90 capsule 10  . carvedilol (COREG) 3.125 MG tablet TAKE 1 TABLET (3.125 MG TOTAL) BY MOUTH 2 (TWO) TIMES DAILY. 180 tablet 1  . cholecalciferol (VITAMIN D) 1000 UNITS tablet Take 1,000 Units by mouth daily.      . clopidogrel (PLAVIX) 75 MG tablet Take 1 tablet (75 mg total) by mouth daily with breakfast. 30 tablet 11  . FLUoxetine (PROZAC) 10 MG capsule Take 10 mg by mouth daily.     Marland Kitchen LORazepam (ATIVAN) 0.5 MG tablet Take 0.5 mg by mouth as needed for anxiety (for anxiety).     . Multiple Vitamins-Minerals (MULTIVITAMIN WITH MINERALS) tablet Take 1 tablet by mouth daily.      . nitroGLYCERIN (NITROSTAT) 0.4 MG SL tablet Place 1 tablet (0.4 mg total) under the tongue every 5 (five) minutes as needed for chest pain. 25 tablet 6  . simvastatin (ZOCOR) 10 MG tablet Take 1 tablet (10 mg total) by mouth daily. 90 tablet 3  . valsartan-hydrochlorothiazide (DIOVAN-HCT) 160-25 MG per tablet TAKE 1 TABLET BY MOUTH DAILY. 90 tablet 1   No current facility-administered medications for this visit.    No Known Allergies  Social History  Social History  . Marital Status: Married    Spouse Name: N/A  . Number of Children: N/A  . Years of Education: N/A   Occupational History  . Not on file.   Social History Main Topics  . Smoking status: Former Smoker    Quit date: 10/04/1994  . Smokeless tobacco: Never Used  . Alcohol Use: No  . Drug Use: No  . Sexual Activity: Not Currently   Other Topics Concern  . Not on file   Social History Narrative    Family History  Problem Relation Age of Onset  . Lung cancer Brother   . Breast cancer Sister   . Kidney disease Brother     renal cancer  . Diabetes Mother   . Heart disease Mother   . Heart attack Mother 47  . Heart disease Sister   . Colon cancer Daughter   . Hypertension Mother   . Hypertension Sister   . Stroke Sister     Review of Systems:  As stated in the HPI and otherwise  negative.   BP 100/50 mmHg  Pulse 72  Ht 5' 6"  (1.676 m)  Wt 179 lb (81.194 kg)  BMI 28.91 kg/m2  SpO2 96%  Physical Examination: General: Well developed, well nourished, NAD HEENT: OP clear, mucus membranes moist SKIN: warm, dry. No rashes. Neuro: No focal deficits Musculoskeletal: Muscle strength 5/5 all ext Psychiatric: Mood and affect normal Neck: No JVD, no carotid bruits, no thyromegaly, no lymphadenopathy. Lungs:Clear bilaterally, no wheezes, rhonci, crackles Cardiovascular: Regular rate and rhythm. No murmurs, gallops or rubs. Abdomen:Soft. Bowel sounds present. Non-tender.  Extremities: No lower extremity edema. Pulses are 2 + in the bilateral DP/PT.  Cardiac cath Oct 13, 2014: Left main: Diffuse 20% stenosis.  Left Anterior Descending Artery: Large caliber vessel that courses to the apex. The proximal vessel is occluded. The mid and distal vessel fills from the patent graft.  Circumflex Artery: Moderate caliber vessel with moderate caliber obtuse marginal branch. The mid vessel has a 99% stenosis.  Right Coronary Artery: Large dominant vessel with 100% mid occlusion. The distal vessel fills from the patent vein graft.  Graft Anatomy:  SVG to PDA is patent LIMA to mid LAD is patent Left Ventricular Angiogram: LVEF=60-65%.   EKG:  EKG is not ordered today. The ekg ordered today demonstrates  Recent Labs: 11/18/2014: BUN 15; Creatinine, Ser 0.83; Hemoglobin 12.9; Platelets 259.0; Potassium 3.9; Sodium 138   Lipid Panel    Component Value Date/Time   CHOL 149 09/23/2013 0837   TRIG 81.0 09/23/2013 0837   HDL 52.90 09/23/2013 0837   CHOLHDL 3 09/23/2013 0837   VLDL 16.2 09/23/2013 0837   LDLCALC 80 09/23/2013 0837     Wt Readings from Last 3 Encounters:  03/24/15 179 lb (81.194 kg)  11/18/14 181 lb 1.9 oz (82.155 kg)  10/14/14 180 lb 12.4 oz (82 kg)     Other studies Reviewed: Additional studies/ records that were reviewed today include:  Review of the  above records demonstrates:    Assessment and Plan:   1. CAD with stable angina: She is s/p 2V CABG in 1996. Last cardiac cath May 2016 at which time a DES was placed in the mid Circumflex. LIMA graft was patent to the LAD and the SVG was patent to the PDA. She is stable. Will continue DAPT with ASA and Plavix for one year. Continue statin and beta blocker.   2. HTN: BP is stable. No changes. Check BMET since  she is on ARB and HCTZ.   3. Hyperlipidemia: Lipids have been well controlled on a statin. No changes. Will repeat lipids and LFTs today.   Current medicines are reviewed at length with the patient today.  The patient does not have concerns regarding medicines.  The following changes have been made:  no change  Labs/ tests ordered today include:Cardiac cath No orders of the defined types were placed in this encounter.    Disposition:   FU with 6 in 4 weeks  Signed, Lauree Chandler, MD 03/24/2015 9:23 AM    [address], [place], [place]  [address] Phone: [phone]; Fax: [phone]

## 2015-03-24 ENCOUNTER — Ambulatory Visit (INDEPENDENT_AMBULATORY_CARE_PROVIDER_SITE_OTHER): Payer: Medicare HMO | Admitting: Cardiovascular Disease

## 2015-03-24 ENCOUNTER — Encounter: Payer: Self-pay | Admitting: Cardiovascular Disease

## 2015-03-24 VITALS — BP 100/50 | HR 72 | Ht 66.0 in | Wt 179.0 lb

## 2015-03-24 DIAGNOSIS — I10 Essential (primary) hypertension: Secondary | ICD-10-CM | POA: Diagnosis not present

## 2015-03-24 DIAGNOSIS — I2510 Atherosclerotic heart disease of native coronary artery without angina pectoris: Secondary | ICD-10-CM | POA: Diagnosis not present

## 2015-03-24 DIAGNOSIS — E785 Hyperlipidemia, unspecified: Secondary | ICD-10-CM | POA: Diagnosis not present

## 2015-03-24 LAB — BASIC METABOLIC PANEL
BUN: 16 mg/dL (ref 7–25)
CO2: 31 mmol/L (ref 20–31)
Calcium: 9.7 mg/dL (ref 8.6–10.4)
Chloride: 101 mmol/L (ref 98–110)
Creat: 0.84 mg/dL (ref 0.50–0.99)
Glucose, Bld: 106 mg/dL — ABNORMAL HIGH (ref 65–99)
Potassium: 4.2 mmol/L (ref 3.5–5.3)
Sodium: 141 mmol/L (ref 135–146)

## 2015-03-24 LAB — HEPATIC FUNCTION PANEL
ALT: 17 U/L (ref 6–29)
AST: 19 U/L (ref 10–35)
Albumin: 4 g/dL (ref 3.6–5.1)
Alkaline Phosphatase: 59 U/L (ref 33–130)
Bilirubin, Direct: 0.3 mg/dL — ABNORMAL HIGH (ref <=0.2)
Indirect Bilirubin: 0.6 mg/dL (ref 0.2–1.2)
Total Bilirubin: 0.9 mg/dL (ref 0.2–1.2)
Total Protein: 6.9 g/dL (ref 6.1–8.1)

## 2015-03-24 LAB — LIPID PANEL
Cholesterol: 143 mg/dL (ref 125–200)
HDL: 48 mg/dL (ref >=46)
LDL Cholesterol: 83 mg/dL (ref <130)
Total CHOL/HDL Ratio: 3 Ratio (ref <=5.0)
Triglycerides: 59 mg/dL (ref <150)
VLDL: 12 mg/dL (ref <30)

## 2015-03-24 NOTE — Patient Instructions (Signed)
Medication Instructions:  Your physician recommends that you continue on your current medications as directed. Please refer to the Current Medication list given to you today.   Labwork: none  Testing/Procedures: none  Follow-Up: Your physician wants you to follow-up in: 6 months.  You will receive a reminder letter in the mail two months in advance. If you don't receive a letter, please call our office to schedule the follow-up appointment.

## 2015-04-20 ENCOUNTER — Other Ambulatory Visit: Payer: Self-pay | Admitting: Gastroenterology

## 2015-05-14 ENCOUNTER — Other Ambulatory Visit: Payer: Self-pay | Admitting: Cardiology

## 2015-07-02 ENCOUNTER — Other Ambulatory Visit: Payer: Self-pay | Admitting: Cardiovascular Disease

## 2015-07-04 ENCOUNTER — Ambulatory Visit (INDEPENDENT_AMBULATORY_CARE_PROVIDER_SITE_OTHER): Payer: PPO | Admitting: Family Medicine

## 2015-07-04 ENCOUNTER — Ambulatory Visit (INDEPENDENT_AMBULATORY_CARE_PROVIDER_SITE_OTHER): Payer: PPO

## 2015-07-04 VITALS — BP 120/80 | HR 76 | Temp 98.7°F | Resp 16 | Ht 66.5 in | Wt 183.0 lb

## 2015-07-04 DIAGNOSIS — R0789 Other chest pain: Secondary | ICD-10-CM

## 2015-07-04 DIAGNOSIS — R05 Cough: Secondary | ICD-10-CM

## 2015-07-04 DIAGNOSIS — H8110 Benign paroxysmal vertigo, unspecified ear: Secondary | ICD-10-CM

## 2015-07-04 DIAGNOSIS — R059 Cough, unspecified: Secondary | ICD-10-CM

## 2015-07-04 MED ORDER — ALBUTEROL SULFATE HFA 108 (90 BASE) MCG/ACT IN AERS: 2.0000 | INHALATION_SPRAY | Freq: Four times a day (QID) | RESPIRATORY_TRACT | Status: DC | PRN

## 2015-07-04 MED ORDER — PREDNISONE 20 MG PO TABS: ORAL_TABLET | ORAL | Status: DC

## 2015-07-04 MED ORDER — ALBUTEROL SULFATE (2.5 MG/3ML) 0.083% IN NEBU
2.5000 mg | INHALATION_SOLUTION | Freq: Once | RESPIRATORY_TRACT | Status: AC
  Administered 2015-07-04: 2.5 mg via RESPIRATORY_TRACT

## 2015-07-04 NOTE — Progress Notes (Signed)
[facility] [address], [place] [address] [phone]  Date:  07/04/2015   Name:  NEGAR SIELER   DOB:  1945-12-21   MRN:  [id number]  PCP:  Reginia Naas, MD    Chief Complaint: Congestion   History of Present Illness:  MEYER DOCKERY is a 70 y.o. very pleasant female patient who presents with the following:  She is here today with chest congestion- she has just a dry cough.  She feels like her chest is tight She has noted this congestion for 3-4 days. Today she noticed dizziness- she had vertigo.  She notes this if she rises from sitting to standing. No sx when she is still.  She first noted it whne she arose from bed this am.  When she holds still the vertigo stops after a few seconds She did have a HA but this went away. Her head feels "full" still however She has had some sneezing, No ST, no earache She has used mucinex  No fever, no chest pain No GI symptoms She has had some vertigo in the past but never this bad No tinnitus  History of CAD- MI x2, CABG and PCI this past year.  She just had a cath in May - she sees Dr. Angelena Form.  He did a stent for her in May.    She did smoke in the past- she smoked for 20 years but quit following her first MI in the 54s.  She has not had an MI since 1996  Patient Active Problem List   Diagnosis Date Noted  . Unstable angina ([place]) 10/13/2014  . CAD (coronary artery disease) 02/09/2011  . HTN (hypertension) 02/09/2011  . GERD 08/17/2009  . ULCERATIVE COLITIS-LEFT SIDE 08/12/2008  . PERSONAL HX COLONIC POLYPS 08/12/2008    Past Medical History  Diagnosis Date  . Arthritis   . Hyperlipidemia   . Hypertension   . Ulcerative colitis   . CAD (coronary artery disease)     2V CABG 1996 (LIMA to LAD, SVG to RCA) Dr. Servando Snare.; last stress test in 2010  . Adenomatous colon polyp 11/2005  . Muscle cramps     and pains  . Anginal pain ([place])   . Myocardial infarction ([place])   . Headache      Past Surgical History  Procedure Laterality Date  . Colonoscopy    . Polypectomy    . Upper gastrointestinal endoscopy    . Coronary artery bypass graft  1996    x 2  . Back surgery      lumbar fusion 25 years ago and ruptured disk 10 years ago same area  . Coronary angioplasty with stent placement  10/13/2014    DES to [place]  . Cardiac catheterization N/A 10/13/2014    Procedure: Left Heart Cath And Coronary Angiography;  Surgeon: Burnell Blanks, MD;  Location: [facility] INVASIVE CV LAB CUPID;  Service: Cardiovascular;  Laterality: N/A;  . Cardiac catheterization N/A 10/13/2014    Procedure: Coronary Stent Intervention;  Surgeon: Burnell Blanks, MD;  Location: [facility] INVASIVE CV LAB CUPID;  Service: Cardiovascular;  Laterality: N/A;    Social History  Substance Use Topics  . Smoking status: Former Smoker    Quit date: 10/04/1994  . Smokeless tobacco: Never Used  . Alcohol Use: No    Family History  Problem Relation Age of Onset  . Lung cancer Brother   . Breast cancer Sister   . Kidney disease Brother  renal cancer  . Diabetes Mother   . Heart disease Mother   . Heart attack Mother 70  . Heart disease Sister   . Colon cancer Daughter   . Hypertension Mother   . Hypertension Sister   . Stroke Sister     No Known Allergies  Medication list has been reviewed and updated.  Current Outpatient Prescriptions on File Prior to Visit  Medication Sig Dispense Refill  . aspirin 81 MG tablet Take 81 mg by mouth daily.      . balsalazide (COLAZAL) 750 MG capsule Take one tablet by mouth three times a day 90 capsule 10  . carvedilol (COREG) 3.125 MG tablet TAKE 1 TABLET BY MOUTH TWICE A DAY 180 tablet 2  . cholecalciferol (VITAMIN D) 1000 UNITS tablet Take 1,000 Units by mouth daily.      . clopidogrel (PLAVIX) 75 MG tablet Take 1 tablet (75 mg total) by mouth daily with breakfast. 30 tablet 11  . FLUoxetine (PROZAC) 10 MG capsule Take 10 mg by mouth daily.      Marland Kitchen LORazepam (ATIVAN) 0.5 MG tablet Take 0.5 mg by mouth as needed for anxiety (for anxiety).     . Multiple Vitamins-Minerals (MULTIVITAMIN WITH MINERALS) tablet Take 1 tablet by mouth daily.      . nitroGLYCERIN (NITROSTAT) 0.4 MG SL tablet Place 1 tablet (0.4 mg total) under the tongue every 5 (five) minutes as needed for chest pain. 25 tablet 6  . simvastatin (ZOCOR) 10 MG tablet Take 1 tablet (10 mg total) by mouth daily. 90 tablet 3  . valsartan-hydrochlorothiazide (DIOVAN-HCT) 160-25 MG tablet TAKE 1 TABLET BY MOUTH DAILY. 90 tablet 2   No current facility-administered medications on file prior to visit.    Review of Systems:  As per HPI- otherwise negative.   Physical Examination: Filed Vitals:   07/04/15 1247  BP: 120/80  Pulse: 76  Temp: 98.7 F (37.1 C)  Resp: 16   Filed Vitals:   07/04/15 1247  Height: 5' 6.5" (1.689 m)  Weight: 183 lb (83.008 kg)   Body mass index is 29.1 kg/(m^2). Ideal Body Weight: Weight in (lb) to have BMI = 25: 156.9  GEN: WDWN, NAD, Non-toxic, A & O x 3, overweight, looks well HEENT: Atraumatic, Normocephalic. Neck supple. No masses, No LAD.  Bilateral TM wnl, oropharynx normal.  PEERL,EOMI.   Ears and Nose: No external deformity. CV: RRR, No M/G/R. No JVD. No thrill. No extra heart sounds. PULM: CTA B, no wheezes, crackles, rhonchi but decreased air flow. No retractions. No resp. distress. No accessory muscle use. EXTR: No c/c/e NEURO Normal gait. Normal strength, sensation and DTR of all extremities  PSYCH: Normally interactive. Conversant. Not depressed or anxious appearing.  Calm demeanor.  Tested dix halpike to the right only- positive   [facility] reading (PRIMARY) by  Dr. Lorelei Pont. CXR: negative  CHEST - 2 VIEW COMPARISON: 06/08/2014 FINDINGS: Postsurgical changes are again seen. Cardiac shadow is stable. The lungs are clear bilaterally. No bony abnormality is seen. IMPRESSION: No acute abnormality noted.  Albuterol neb  treatment: improved her sx   Assessment and Plan: Chest tightness - Plan: predniSONE (DELTASONE) 20 MG tablet, albuterol (PROVENTIL HFA;VENTOLIN HFA) 108 (90 Base) MCG/ACT inhaler  Cough - Plan: DG Chest 2 View, albuterol (PROVENTIL) (2.5 MG/3ML) 0.083% nebulizer solution 2.5 mg  BPPV (benign paroxysmal positional vertigo), unspecified laterality   Here today with chest tightness and sinus congestion- likely viral, it is also possible that she has some element  of COPD She was improved by an albuterol neb in the office  Will treat with a short course of prednisone as well Asked her to call me if not feeling better soon and she will do so Discussed likely BPPV.  At this time sx are not severe enough to require any medication but she will keep me posted regarding her progress   Signed Lamar Blinks, MD

## 2015-07-04 NOTE — Patient Instructions (Signed)
Please take the albuterol and use the inhaler as needed Let me know if you do not feel better in the next 1-2 days- Sooner if worse.

## 2015-08-13 ENCOUNTER — Telehealth: Payer: Self-pay | Admitting: Gastroenterology

## 2015-08-13 NOTE — Telephone Encounter (Signed)
Patient called regarding "severe acid reflux" and constipation. She is not taking anything for heartburn and just bought prevacid OTC to see if it would help, 15mg  tablet. I told her to take it BID if she has severe symptoms, 1/2 hr prior to a meal, and see if it helps. She should take it like this for one week and then can decrease to q day. She is also quite constipated and asking for relief. Recommend she take miralax once to twice daily and discussed with her how to titrate up as needed. If this is not working she can call for reassessment. She will see Dr. Fuller Plan in clinic for follow up. She agreed with the plan .

## 2015-08-13 NOTE — Telephone Encounter (Signed)
Left message for patient to call back  

## 2015-08-14 NOTE — Telephone Encounter (Signed)
Patient spoke with on call MD last night.  She says all of her questions were answered

## 2015-08-31 ENCOUNTER — Other Ambulatory Visit: Payer: Self-pay | Admitting: Gastroenterology

## 2015-08-31 DIAGNOSIS — H524 Presbyopia: Secondary | ICD-10-CM | POA: Diagnosis not present

## 2015-08-31 DIAGNOSIS — H5203 Hypermetropia, bilateral: Secondary | ICD-10-CM | POA: Diagnosis not present

## 2015-08-31 DIAGNOSIS — H35033 Hypertensive retinopathy, bilateral: Secondary | ICD-10-CM | POA: Diagnosis not present

## 2015-08-31 DIAGNOSIS — I10 Essential (primary) hypertension: Secondary | ICD-10-CM | POA: Diagnosis not present

## 2015-08-31 DIAGNOSIS — H52223 Regular astigmatism, bilateral: Secondary | ICD-10-CM | POA: Diagnosis not present

## 2015-09-01 ENCOUNTER — Telehealth: Payer: Self-pay | Admitting: Gastroenterology

## 2015-09-01 MED ORDER — BALSALAZIDE DISODIUM 750 MG PO CAPS: ORAL_CAPSULE | ORAL | Status: DC

## 2015-09-01 NOTE — Telephone Encounter (Signed)
Left message for patient to return my call.

## 2015-09-01 NOTE — Telephone Encounter (Signed)
Informed patient that she is over due for a follow up visit and she needs to keep her appt for further refills. Also asked patient if another provider has been prescribing medication because according to our refills she should of been out of medication in 01/2015. Patient states no one else has been refilling medication but decreased her Colazal on her own to one tablet twice daily because she she states she has been feeling much better. I informed patient that it's not appropriate for her to adjust her medication herself and she also needs to follow up with Dr. Fuller Plan every year. Patient states Dr. Fuller Plan told her that she could follow up every 2 years. I informed patient that it is probably a miss communication because Dr. Fuller Plan does like to see patients that have ulcerative colitis every 6 months to every year. Patient states she must have heard him wrong or maybe he was talking about Colonoscopies. Informed patient I will send this to Dr. Fuller Plan so that his aware but will also send a refill so she is not completely out of medication.

## 2015-10-01 ENCOUNTER — Telehealth: Payer: Self-pay | Admitting: *Deleted

## 2015-10-01 ENCOUNTER — Encounter: Payer: Self-pay | Admitting: Gastroenterology

## 2015-10-01 ENCOUNTER — Ambulatory Visit (INDEPENDENT_AMBULATORY_CARE_PROVIDER_SITE_OTHER): Payer: PPO | Admitting: Gastroenterology

## 2015-10-01 ENCOUNTER — Ambulatory Visit: Payer: Medicare HMO | Admitting: Gastroenterology

## 2015-10-01 VITALS — BP 118/60 | HR 72 | Ht 66.5 in | Wt 184.0 lb

## 2015-10-01 DIAGNOSIS — K5150 Left sided colitis without complications: Secondary | ICD-10-CM | POA: Diagnosis not present

## 2015-10-01 DIAGNOSIS — K219 Gastro-esophageal reflux disease without esophagitis: Secondary | ICD-10-CM | POA: Diagnosis not present

## 2015-10-01 DIAGNOSIS — Z800 Family history of malignant neoplasm of digestive organs: Secondary | ICD-10-CM

## 2015-10-01 MED ORDER — BALSALAZIDE DISODIUM 750 MG PO CAPS: ORAL_CAPSULE | ORAL | Status: DC

## 2015-10-01 MED ORDER — NA SULFATE-K SULFATE-MG SULF 17.5-3.13-1.6 GM/177ML PO SOLN: ORAL | Status: DC

## 2015-10-01 NOTE — Assessment & Plan Note (Addendum)
Currently asymptomatic. Refill balsalazide at current dose. She has a personal history of adenomatous colon polyps and a family history of colon cancer in her daughter. 3 year interval surveillance colonoscopy recommended this summer. The risks (including bleeding, perforation, infection, missed lesions, medication reactions and possible hospitalization or surgery if complications occur), benefits, and alternatives to colonoscopy with possible biopsy and possible polypectomy were discussed with the patient and they consent to proceed. Hold Plavix 5 days before procedure - will instruct when and how to resume after procedure. Low but real risk of cardiovascular event such as heart attack, stroke, embolism, thrombosis or ischemia/infarct of other organs off Plavix explained and need to seek urgent help if this occurs. The patient consents to proceed. Will communicate by phone or EMR with patient's prescribing provider to confirm that holding Plavix is reasonable in this case.

## 2015-10-01 NOTE — Telephone Encounter (Signed)
10/01/2015  RE: AZARRIA YTURRALDE DOB: [birth year] MRN: [id number]  Dear Ms. Barrett,   We have scheduled the above patient for a colonoscopy procedure. Our records show that she is on anticoagulation therapy.  Please advise as to whether the patient may come off her therapy of Plavix 5 days prior to the procedure, which is scheduled for 12/11/15. Please route your response to Dixon Boos, CMA  Sincerely,  Dixon Boos

## 2015-10-01 NOTE — Patient Instructions (Addendum)
You have been scheduled for a colonoscopy. Please follow written instructions given to you at your visit today.  Please pick up your prep supplies at the pharmacy within the next 1-3 days. If you use inhalers (even only as needed), please bring them with you on the day of your procedure. Your physician has requested that you go to www.startemmi.com and enter the access code given to you at your visit today. This web site gives a general overview about your procedure. However, you should still follow specific instructions given to you by our office regarding your preparation for the procedure.  You will be contacted by our office prior to your procedure for directions on holding your Plavix.  If you do not hear from our office 1 week prior to your scheduled procedure, please call [phone] to discuss.   We have sent the following medications to your pharmacy for you to pick up at your convenience: Balsalazide  Patient advised to avoid spicy, acidic, citrus, chocolate, mints, fruit and fruit juices.  Limit the intake of caffeine, alcohol and Soda.  Don't exercise too soon after eating.  Don't lie down within 3-4 hours of eating.  Elevate the head of your bed.  If you are age 46 or older, your body mass index should be between 23-30. Your Body mass index is 29.26 kg/(m^2). If this is out of the aforementioned range listed, please consider follow up with your Primary Care Provider.  If you are age 38 or younger, your body mass index should be between 19-25. Your Body mass index is 29.26 kg/(m^2). If this is out of the aformentioned range listed, please consider follow up with your Primary Care Provider.

## 2015-10-01 NOTE — Progress Notes (Signed)
    History of Present Illness: This is a 70 year old female with heartburn. Also returning for follow-up of left-sided ulcerative colitis. She has significant flare of symptoms of reflux symptoms last month and was placed on Prevacid 15 mg twice daily and then once daily and her symptoms resolved and she discontinued all aspirin reflux medication she has had intermittent problems with heartburn over the years but his not been on long-term medication. She currently is asymptomatic. Denies weight loss, abdominal pain, constipation, diarrhea, change in stool caliber, melena, hematochezia, nausea, vomiting, dysphagia, chest pain.  Current Medications, Allergies, Past Medical History, Past Surgical History, Family History and Social History were reviewed in [employer] record.  Physical Exam: General: Well developed, well nourished, no acute distress Head: Normocephalic and atraumatic Eyes:  sclerae anicteric, EOMI Ears: Normal auditory acuity Mouth: No deformity or lesions Lungs: Clear throughout to auscultation Heart: Regular rate and rhythm; no murmurs, rubs or bruits Abdomen: Soft, non tender and non distended. No masses, hepatosplenomegaly or hernias noted. Normal Bowel sounds Rectal: deferred to colonoscopy Musculoskeletal: Symmetrical with no gross deformities  Pulses:  Normal pulses noted Extremities: No clubbing, cyanosis, edema or deformities noted Neurological: Alert oriented x 4, grossly nonfocal Psychological:  Alert and cooperative. Normal mood and affect  Assessment and Recommendations:

## 2015-10-01 NOTE — Assessment & Plan Note (Signed)
Currently asymptomatic. Standard antireflux measures provided to the patient. Resume Prevacid 15 mg daily if symptoms recur.

## 2015-10-03 ENCOUNTER — Other Ambulatory Visit: Payer: Self-pay | Admitting: Physician Assistant

## 2015-10-05 NOTE — Telephone Encounter (Signed)
[name] [name] can come off her Plavix for the procedure. Thanks

## 2015-10-05 NOTE — Telephone Encounter (Signed)
I have advised patient that per Rosaria Ferries, PA-C she may hold her Plavix 5 days prior to her procedure. Patient verbalizes understanding.

## 2015-10-06 ENCOUNTER — Telehealth: Payer: Self-pay | Admitting: Cardiovascular Disease

## 2015-10-09 NOTE — Telephone Encounter (Signed)
Closed Encounter  °

## 2015-10-13 DIAGNOSIS — Z803 Family history of malignant neoplasm of breast: Secondary | ICD-10-CM | POA: Diagnosis not present

## 2015-10-13 DIAGNOSIS — Z1231 Encounter for screening mammogram for malignant neoplasm of breast: Secondary | ICD-10-CM | POA: Diagnosis not present

## 2015-10-21 ENCOUNTER — Telehealth: Payer: Self-pay | Admitting: Cardiovascular Disease

## 2015-10-21 NOTE — Telephone Encounter (Signed)
Spoke with pt. She reports pain in right shoulder and arm for last couple of days.  Describes as a dull ache. No recent lifting or increased use of arm. Pretty much continuous. Seems to be worse in the afternoon.  Not related to exertion.  These are not like the symptoms she had with MI or prior to stent placement last year. Pain at that time was tightness in chest and she tired easily. She is not having these symptoms now.  She is able to sleep OK.  She puts a cream on her shoulder that starts cold and gets warm and this helps the pain and she is able sleep. Has taken Alleve and this helped the pain.  I told her this did not sound like cardiac pain and I asked her to make appt with primary care this week to have this evaluated.  I told her if symptoms worsen or become like previous heart pain she should go to ED for evaluation.

## 2015-10-21 NOTE — Telephone Encounter (Signed)
New message     Pt c/o shoulder pain radiating down to rt arm.  Could this be coming from her heart?

## 2015-10-22 NOTE — Telephone Encounter (Signed)
Agree. Thanks

## 2015-11-04 DIAGNOSIS — I2510 Atherosclerotic heart disease of native coronary artery without angina pectoris: Secondary | ICD-10-CM | POA: Diagnosis not present

## 2015-11-04 DIAGNOSIS — Z0001 Encounter for general adult medical examination with abnormal findings: Secondary | ICD-10-CM | POA: Diagnosis not present

## 2015-11-04 DIAGNOSIS — F329 Major depressive disorder, single episode, unspecified: Secondary | ICD-10-CM | POA: Diagnosis not present

## 2015-11-04 DIAGNOSIS — F419 Anxiety disorder, unspecified: Secondary | ICD-10-CM | POA: Diagnosis not present

## 2015-11-04 DIAGNOSIS — M62830 Muscle spasm of back: Secondary | ICD-10-CM | POA: Diagnosis not present

## 2015-11-04 DIAGNOSIS — I10 Essential (primary) hypertension: Secondary | ICD-10-CM | POA: Diagnosis not present

## 2015-11-04 DIAGNOSIS — Z1389 Encounter for screening for other disorder: Secondary | ICD-10-CM | POA: Diagnosis not present

## 2015-11-04 DIAGNOSIS — E785 Hyperlipidemia, unspecified: Secondary | ICD-10-CM | POA: Diagnosis not present

## 2015-11-16 ENCOUNTER — Ambulatory Visit: Payer: Medicare HMO | Admitting: Cardiovascular Disease

## 2015-11-27 ENCOUNTER — Encounter: Payer: Self-pay | Admitting: Gastroenterology

## 2015-11-30 ENCOUNTER — Other Ambulatory Visit: Payer: Self-pay | Admitting: Gastroenterology

## 2015-12-04 ENCOUNTER — Encounter: Payer: Self-pay | Admitting: Cardiovascular Disease

## 2015-12-04 ENCOUNTER — Ambulatory Visit (AMBULATORY_SURGERY_CENTER): Payer: PPO | Admitting: Gastroenterology

## 2015-12-04 ENCOUNTER — Encounter: Payer: Self-pay | Admitting: Gastroenterology

## 2015-12-04 VITALS — BP 119/57 | HR 68 | Temp 98.0°F | Resp 17 | Ht 66.0 in | Wt 184.0 lb

## 2015-12-04 DIAGNOSIS — D120 Benign neoplasm of cecum: Secondary | ICD-10-CM | POA: Diagnosis not present

## 2015-12-04 DIAGNOSIS — D122 Benign neoplasm of ascending colon: Secondary | ICD-10-CM | POA: Diagnosis not present

## 2015-12-04 DIAGNOSIS — Z86010 Personal history of colonic polyps: Secondary | ICD-10-CM | POA: Diagnosis not present

## 2015-12-04 DIAGNOSIS — Z951 Presence of aortocoronary bypass graft: Secondary | ICD-10-CM | POA: Diagnosis not present

## 2015-12-04 DIAGNOSIS — Z800 Family history of malignant neoplasm of digestive organs: Secondary | ICD-10-CM | POA: Diagnosis not present

## 2015-12-04 DIAGNOSIS — K5190 Ulcerative colitis, unspecified, without complications: Secondary | ICD-10-CM | POA: Diagnosis not present

## 2015-12-04 DIAGNOSIS — D123 Benign neoplasm of transverse colon: Secondary | ICD-10-CM | POA: Diagnosis not present

## 2015-12-04 DIAGNOSIS — I2510 Atherosclerotic heart disease of native coronary artery without angina pectoris: Secondary | ICD-10-CM | POA: Diagnosis not present

## 2015-12-04 DIAGNOSIS — K5150 Left sided colitis without complications: Secondary | ICD-10-CM

## 2015-12-04 DIAGNOSIS — E669 Obesity, unspecified: Secondary | ICD-10-CM | POA: Diagnosis not present

## 2015-12-04 DIAGNOSIS — I10 Essential (primary) hypertension: Secondary | ICD-10-CM | POA: Diagnosis not present

## 2015-12-04 DIAGNOSIS — I252 Old myocardial infarction: Secondary | ICD-10-CM | POA: Diagnosis not present

## 2015-12-04 MED ORDER — SODIUM CHLORIDE 0.9 % IV SOLN
500.0000 mL | INTRAVENOUS | Status: DC
Start: 2015-12-04 — End: 2015-12-04

## 2015-12-04 NOTE — Op Note (Signed)
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 The patient's tolerance of                            previous anesthesia was also reviewed. The risks                            and benefits of the procedure and the sedation                            options and risks were discussed with the patient.                            All questions were answered, and informed consent                            was obtained. Prior Anticoagulants: The patient has                            taken Plavix (clopidogrel), last dose was 5 days                            prior to procedure. ASA Grade Assessment: III - A                            patient with severe systemic disease. After                            reviewing the risks and benefits, the patient was                            deemed in satisfactory condition to undergo the  procedure.                           After obtaining informed consent, the colonoscope                            was passed under direct vision. Throughout the                            procedure, the patient's blood pressure, pulse, and                            oxygen saturations were monitored continuously. The                            Model CF-HQ190L [id number]) scope was  introduced                            through the anus and advanced to the the cecum,                            identified by appendiceal orifice and ileocecal                            valve. The colonoscopy was performed without                            difficulty. The patient tolerated the procedure                            well. The quality of the bowel preparation was                            excellent. The ileocecal valve, appendiceal                            orifice, and rectum were photographed. Scope In: 1:06:22 PM Scope Out: 1:24:08 PM Scope Withdrawal Time: 0 hours 13 minutes 13 seconds  Total Procedure Duration: 0 hours 17 minutes 46 seconds  Findings:                 The digital rectal exam was normal.                           Two sessile polyps were found in the transverse                            colon and ascending colon. The polyps were 5 to 8                            mm in size. These polyps were removed with a cold                            snare. Resection and retrieval were complete.  A 4 mm polyp was found in the cecum. The polyp was                            sessile. The polyp was removed with a cold biopsy                            forceps. Resection and retrieval were complete.                           The exam was otherwise normal throughout the                            examined colon.                           The retroflexed view of the distal rectum and anal                            verge was normal and showed no anal or rectal                            abnormalities. Complications:            No immediate complications. Estimated Blood Loss:     Estimated blood loss was minimal. Impression:               - Two 5 to 8 mm polyps in the transverse colon and                            in the ascending colon, removed with a cold snare.                            Resected and retrieved.                           -  One 4 mm polyp in the cecum, removed with a cold                            biopsy forceps. Resected and retrieved.                           - The distal rectum and anal verge are normal on                            retroflexion view. Random biopsies taken in right                            colon and left colon of normal appearing mucosa for                            UC surveillance. Recommendation:           - Patient has a contact number available for  emergencies. The signs and symptoms of potential                            delayed complications were discussed with the                            patient. Return to normal activities tomorrow.                            Written discharge instructions were provided to the                            patient.                           - Resume previous diet.                           - Continue present medications.                           - Resume Plavix (clopidogrel) at prior dose                            tomorrow. Refer to managing physician for further                            adjustment of therapy.                           - Await pathology results.                           - Repeat colonoscopy in 3 years for surveillance. Ladene Artist, MD 12/04/2015 1:31:07 PM This report has been signed electronically.

## 2015-12-04 NOTE — Patient Instructions (Signed)
YOU HAD AN ENDOSCOPIC PROCEDURE TODAY AT [facility]:   Refer to the procedure report that was given to you for any specific questions about what was found during the examination.  If the procedure report does not answer your questions, please call your gastroenterologist to clarify.  If you requested that your care partner not be given the details of your procedure findings, then the procedure report has been included in a sealed envelope for you to review at your convenience later.  YOU SHOULD EXPECT: Some feelings of bloating in the abdomen. Passage of more gas than usual.  Walking can help get rid of the air that was put into your GI tract during the procedure and reduce the bloating. If you had a lower endoscopy (such as a colonoscopy or flexible sigmoidoscopy) you may notice spotting of blood in your stool or on the toilet paper. If you underwent a bowel prep for your procedure, you may not have a normal bowel movement for a few days.  Please Note:  You might notice some irritation and congestion in your nose or some drainage.  This is from the oxygen used during your procedure.  There is no need for concern and it should clear up in a day or so.  SYMPTOMS TO REPORT IMMEDIATELY:   Following lower endoscopy (colonoscopy or flexible sigmoidoscopy):  Excessive amounts of blood in the stool  Significant tenderness or worsening of abdominal pains  Swelling of the abdomen that is new, acute  Fever of 100F or higher   For urgent or emergent issues, a gastroenterologist can be reached at any hour by calling [phone].   DIET: Your first meal following the procedure should be a small meal and then it is ok to progress to your normal diet. Heavy or fried foods are harder to digest and may make you feel nauseous or bloated.  Likewise, meals heavy in dairy and vegetables can increase bloating.  Drink plenty of fluids but you should avoid alcoholic beverages for 24  hours.  ACTIVITY:  You should plan to take it easy for the rest of today and you should NOT DRIVE or use heavy machinery until tomorrow (because of the sedation medicines used during the test).    FOLLOW UP: Our staff will call the number listed on your records the next business day following your procedure to check on you and address any questions or concerns that you may have regarding the information given to you following your procedure. If we do not reach you, we will leave a message.  However, if you are feeling well and you are not experiencing any problems, there is no need to return our call.  We will assume that you have returned to your regular daily activities without incident.  If any biopsies were taken you will be contacted by phone or by letter within the next 1-3 weeks.  Please call us at [phone] if you have not heard about the biopsies in 3 weeks.    SIGNATURES/CONFIDENTIALITY: You and/or your care partner have signed paperwork which will be entered into your electronic medical record.  These signatures attest to the fact that that the information above on your After Visit Summary has been reviewed and is understood.  Full responsibility of the confidentiality of this discharge information lies with you and/or your care-partner.  You may resume your plavix tomorrow per Dr. Fuller Plan.  Read all of the handouts given to you by your recovery room nurse.  Thank-you for choosing [facility] for your healthcare needs today.

## 2015-12-04 NOTE — Progress Notes (Signed)
Report to PACU, RN, vss, BBS= Clear.  

## 2015-12-04 NOTE — Progress Notes (Signed)
Called to room to assist during endoscopic procedure.  Patient ID and intended procedure confirmed with present staff. Received instructions for my participation in the procedure from the performing physician.  

## 2015-12-07 ENCOUNTER — Telehealth: Payer: Self-pay

## 2015-12-07 NOTE — Telephone Encounter (Signed)
  Follow up Call-  Call back number 12/04/2015  Post procedure Call Back phone  # [phone] after 8 am [phone]   Permission to leave phone message Yes     Patient questions:  Do you have a fever, pain , or abdominal swelling? No. Pain Score  0 *  Have you tolerated food without any problems? Yes.    Have you been able to return to your normal activities? Yes.    Do you have any questions about your discharge instructions: Diet   No. Medications  No. Follow up visit  No.  Do you have questions or concerns about your Care? No.  Actions: * If pain score is 4 or above: No action needed, pain <4.

## 2015-12-07 NOTE — Telephone Encounter (Signed)
Error encounter. 

## 2015-12-10 ENCOUNTER — Encounter: Payer: Self-pay | Admitting: Gastroenterology

## 2015-12-11 ENCOUNTER — Encounter: Payer: PPO | Admitting: Gastroenterology

## 2015-12-22 DIAGNOSIS — M25511 Pain in right shoulder: Secondary | ICD-10-CM | POA: Diagnosis not present

## 2016-01-04 ENCOUNTER — Ambulatory Visit: Payer: PPO | Admitting: Cardiovascular Disease

## 2016-01-06 ENCOUNTER — Ambulatory Visit (INDEPENDENT_AMBULATORY_CARE_PROVIDER_SITE_OTHER): Payer: PPO | Admitting: Cardiovascular Disease

## 2016-01-06 VITALS — BP 128/68 | HR 76 | Ht 66.0 in | Wt 180.8 lb

## 2016-01-06 DIAGNOSIS — I10 Essential (primary) hypertension: Secondary | ICD-10-CM | POA: Diagnosis not present

## 2016-01-06 DIAGNOSIS — E785 Hyperlipidemia, unspecified: Secondary | ICD-10-CM

## 2016-01-06 DIAGNOSIS — I2510 Atherosclerotic heart disease of native coronary artery without angina pectoris: Secondary | ICD-10-CM

## 2016-01-06 MED ORDER — CLOPIDOGREL BISULFATE 75 MG PO TABS: ORAL_TABLET | ORAL | 3 refills | Status: DC

## 2016-01-06 MED ORDER — CARVEDILOL 3.125 MG PO TABS: 3.1250 mg | ORAL_TABLET | Freq: Two times a day (BID) | ORAL | 3 refills | Status: DC

## 2016-01-06 MED ORDER — SIMVASTATIN 10 MG PO TABS: 10.0000 mg | ORAL_TABLET | Freq: Every day | ORAL | 3 refills | Status: DC

## 2016-01-06 MED ORDER — NITROGLYCERIN 0.4 MG SL SUBL: 0.4000 mg | SUBLINGUAL_TABLET | SUBLINGUAL | 6 refills | Status: DC | PRN

## 2016-01-06 MED ORDER — VALSARTAN-HYDROCHLOROTHIAZIDE 160-25 MG PO TABS
1.0000 | ORAL_TABLET | Freq: Every day | ORAL | 3 refills | Status: DC
Start: 2016-01-06 — End: 2017-01-18

## 2016-01-06 NOTE — Patient Instructions (Signed)

## 2016-01-06 NOTE — Progress Notes (Signed)
Chief Complaint  Patient presents with  . 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 She has been followed in the past by Dr. Doreatha Lew. Last cath May 2016. She was found to have a severe stenosis in the mid Circumflex treated with a 2.5 x 16 mm Promus Premier DES. The LIMA was patent to the LAD and the SVG was patent to the PDA.   She is here today for follow up. She has been doing well. No chest pain or SOB. Recent right shoulder pain which improved with a steroid injection in primary care, felt to be tendinitis.   Primary Care Physician: Reginia Naas, MD   Past Medical History:  Diagnosis Date  . Adenomatous colon polyp 11/2005  . Anginal pain ([place])   . Arthritis   . CAD (coronary artery disease)    2V CABG 1996 (LIMA to LAD, SVG to RCA) Dr. Servando Snare.; last stress test in 2010  . Headache   . Hyperlipidemia   . Hypertension   . Muscle cramps    and pains  . Myocardial infarction ([place])   . Ulcerative colitis     Past Surgical History:  Procedure Laterality Date  . BACK SURGERY     lumbar fusion 25 years ago and ruptured disk 10 years ago same area  . CARDIAC CATHETERIZATION N/A 10/13/2014   Procedure: Left Heart Cath And Coronary Angiography;  Surgeon: Burnell Blanks, MD;  Location: [facility];  Service: Cardiovascular;  Laterality: N/A;  . CARDIAC CATHETERIZATION N/A 10/13/2014   Procedure: Coronary Stent Intervention;  Surgeon: Burnell Blanks, MD;  Location: [facility];  Service: Cardiovascular;  Laterality: N/A;  . COLONOSCOPY    . CORONARY ANGIOPLASTY WITH STENT PLACEMENT  10/13/2014   DES to [place]  . CORONARY ARTERY BYPASS GRAFT  1996   x 2  . POLYPECTOMY    . UPPER GASTROINTESTINAL ENDOSCOPY      Current  Outpatient Prescriptions  Medication Sig Dispense Refill  . aspirin 81 MG tablet Take 81 mg by mouth daily.      . balsalazide (COLAZAL) 750 MG capsule TAKE ONE TABLET BY MOUTH THREE TIMES A DAY 90 capsule 1  . carvedilol (COREG) 3.125 MG tablet TAKE 1 TABLET BY MOUTH TWICE A DAY 180 tablet 2  . cholecalciferol (VITAMIN D) 1000 UNITS tablet Take 1,000 Units by mouth daily.      . clopidogrel (PLAVIX) 75 MG tablet TAKE 1 TABLET (75 MG TOTAL) BY MOUTH DAILY WITH BREAKFAST. 30 tablet 3  . FLUoxetine (PROZAC) 10 MG capsule Take 10 mg by mouth daily.     Marland Kitchen LORazepam (ATIVAN) 0.5 MG tablet Take 0.5 mg by mouth as needed for anxiety (for anxiety). Reported on 12/04/2015    . Multiple Vitamins-Minerals (MULTIVITAMIN WITH MINERALS) tablet Take 1 tablet by mouth daily.      . simvastatin (ZOCOR) 10 MG tablet Take 1 tablet (10 mg total) by mouth daily. 90 tablet 3  . valsartan-hydrochlorothiazide (DIOVAN-HCT) 160-25 MG tablet TAKE 1 TABLET BY MOUTH DAILY. 90 tablet 2  . nitroGLYCERIN (NITROSTAT) 0.4 MG SL tablet Place 1 tablet (0.4 mg total) under the tongue every 5 (five) minutes as needed for chest pain. 25 tablet 6   No current facility-administered medications for this  visit.     No Known Allergies  Social History   Social History  . Marital status: Married    Spouse name: N/A  . Number of children: N/A  . Years of education: N/A   Occupational History  . Not on file.   Social History Main Topics  . Smoking status: Former Smoker    Quit date: 10/04/1994  . Smokeless tobacco: Never Used  . Alcohol use No  . Drug use: No  . Sexual activity: Not Currently   Other Topics Concern  . Not on file   Social History Narrative  . No narrative on file    Family History  Problem Relation Age of Onset  . Lung cancer Brother   . Breast cancer Sister   . Kidney disease Brother     renal cancer  . Diabetes Mother   . Heart disease Mother   . Heart attack Mother 4  . Heart disease Sister    . Colon cancer Daughter   . Hypertension Mother   . Hypertension Sister   . Stroke Sister     Review of Systems:  As stated in the HPI and otherwise negative.   BP 128/68 (BP Location: Left Arm, Patient Position: Sitting, Cuff Size: Normal)   Pulse 76   Ht 5' 6"  (1.676 m)   Wt 180 lb 12.8 oz (82 kg)   BMI 29.18 kg/m   Physical Examination: General: Well developed, well nourished, NAD  HEENT: OP clear, mucus membranes moist  SKIN: warm, dry. No rashes. Neuro: No focal deficits  Musculoskeletal: Muscle strength 5/5 all ext  Psychiatric: Mood and affect normal  Neck: No JVD, no carotid bruits, no thyromegaly, no lymphadenopathy.  Lungs:Clear bilaterally, no wheezes, rhonci, crackles Cardiovascular: Regular rate and rhythm. No murmurs, gallops or rubs. Abdomen:Soft. Bowel sounds present. Non-tender.  Extremities: No lower extremity edema. Pulses are 2 + in the bilateral DP/PT.  Cardiac cath Oct 13, 2014: Left main: Diffuse 20% stenosis.  Left Anterior Descending Artery: Large caliber vessel that courses to the apex. The proximal vessel is occluded. The mid and distal vessel fills from the patent graft.  Circumflex Artery: Moderate caliber vessel with moderate caliber obtuse marginal branch. The mid vessel has a 99% stenosis.  Right Coronary Artery: Large dominant vessel with 100% mid occlusion. The distal vessel fills from the patent vein graft.  Graft Anatomy:  SVG to PDA is patent LIMA to mid LAD is patent Left Ventricular Angiogram: LVEF=60-65%.   EKG:  EKG is  ordered today. The ekg ordered today demonstrates NSR, rate 76 bpm  Recent Labs: 03/24/2015: ALT 17; BUN 16; Creat 0.84; Potassium 4.2; Sodium 141   Lipid Panel    Component Value Date/Time   CHOL 143 03/24/2015 0938   TRIG 59 03/24/2015 0938   HDL 48 03/24/2015 0938   CHOLHDL 3.0 03/24/2015 0938   VLDL 12 03/24/2015 0938   LDLCALC 83 03/24/2015 0938     Wt Readings from Last 3 Encounters:  01/06/16  180 lb 12.8 oz (82 kg)  12/04/15 184 lb (83.5 kg)  10/01/15 184 lb (83.5 kg)     Other studies Reviewed: Additional studies/ records that were reviewed today include:  Review of the above records demonstrates:    Assessment and Plan:   1. CAD without angina: She is s/p 2V CABG in 1996. Last cardiac cath May 2016 at which time a DES was placed in the mid Circumflex. LIMA graft was patent to the LAD and the  SVG was patent to the PDA. She is stable. Will continue DAPT with ASA and Plavix. Continue statin and beta blocker.   2. HTN: BP is stable. No changes.   3. Hyperlipidemia: Lipids have been well controlled on a statin. No changes.  Current medicines are reviewed at length with the patient today.  The patient does not have concerns regarding medicines.  The following changes have been made:  no change  Labs/ tests ordered today include:Cardiac cath No orders of the defined types were placed in this encounter.   Disposition:   FU with me in 12 months.   Signed, Lauree Chandler, MD 01/06/2016 1:56 PM    [facility] [address], [place], [place]  [address] Phone: [phone]; Fax: [phone]

## 2016-02-27 ENCOUNTER — Other Ambulatory Visit: Payer: Self-pay | Admitting: Gastroenterology

## 2016-02-29 DIAGNOSIS — Z23 Encounter for immunization: Secondary | ICD-10-CM | POA: Diagnosis not present

## 2016-03-09 DIAGNOSIS — Z6829 Body mass index (BMI) 29.0-29.9, adult: Secondary | ICD-10-CM | POA: Diagnosis not present

## 2016-03-09 DIAGNOSIS — Z1389 Encounter for screening for other disorder: Secondary | ICD-10-CM | POA: Diagnosis not present

## 2016-03-09 DIAGNOSIS — Z01419 Encounter for gynecological examination (general) (routine) without abnormal findings: Secondary | ICD-10-CM | POA: Diagnosis not present

## 2016-03-09 DIAGNOSIS — Z124 Encounter for screening for malignant neoplasm of cervix: Secondary | ICD-10-CM | POA: Diagnosis not present

## 2016-03-11 ENCOUNTER — Other Ambulatory Visit: Payer: Self-pay

## 2016-03-11 MED ORDER — BALSALAZIDE DISODIUM 750 MG PO CAPS: ORAL_CAPSULE | ORAL | 1 refills | Status: DC

## 2016-05-18 ENCOUNTER — Ambulatory Visit (INDEPENDENT_AMBULATORY_CARE_PROVIDER_SITE_OTHER): Payer: PPO | Admitting: Cardiovascular Disease

## 2016-05-18 ENCOUNTER — Telehealth: Payer: Self-pay | Admitting: Cardiovascular Disease

## 2016-05-18 VITALS — BP 124/72 | HR 68 | Ht 66.0 in | Wt 184.0 lb

## 2016-05-18 DIAGNOSIS — I2510 Atherosclerotic heart disease of native coronary artery without angina pectoris: Secondary | ICD-10-CM | POA: Diagnosis not present

## 2016-05-18 DIAGNOSIS — I208 Other forms of angina pectoris: Secondary | ICD-10-CM | POA: Diagnosis not present

## 2016-05-18 DIAGNOSIS — I10 Essential (primary) hypertension: Secondary | ICD-10-CM

## 2016-05-18 DIAGNOSIS — E7800 Pure hypercholesterolemia, unspecified: Secondary | ICD-10-CM

## 2016-05-18 MED ORDER — PANTOPRAZOLE SODIUM 40 MG PO TBEC: 40.0000 mg | DELAYED_RELEASE_TABLET | Freq: Every day | ORAL | 11 refills | Status: DC

## 2016-05-18 NOTE — Telephone Encounter (Signed)
New Message  Pt call requesting to speak with RN about getting an appt today 12/6. Pt states she feels that she has a blockage and is feeling really tired. Please call back to discuss

## 2016-05-18 NOTE — Progress Notes (Signed)
Chief Complaint  Patient presents with  . Chest Pain  . 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 She has been followed in the past by Dr. Doreatha Lew. Last cath May 2016. She was found to have a severe stenosis in the mid Circumflex treated with a 2.5 x 16 mm Promus Premier DES. The LIMA was patent to the LAD and the SVG was patent to the PDA.   She is here today for follow up. She called our office today and asked to be seen. She had onset of substernal chest pressure 4 days ago. She has noticed heartburn for one month after meals. She describes this pressure as mild now but continuous for the last 4 days with no resolution or worsening. No nausea or vomiting. No dyspnea. Dyspnea is her anginal equivalent. She also reports ongoing fatigue.   Primary Care Physician: Reginia Naas, MD   Past Medical History:  Diagnosis Date  . Adenomatous colon polyp 11/2005  . Anginal pain ([place])   . Arthritis   . CAD (coronary artery disease)    2V CABG 1996 (LIMA to LAD, SVG to RCA) Dr. Servando Snare.; last stress test in 2010  . Headache   . Hyperlipidemia   . Hypertension   . Muscle cramps    and pains  . Myocardial infarction   . Ulcerative colitis     Past Surgical History:  Procedure Laterality Date  . BACK SURGERY     lumbar fusion 25 years ago and ruptured disk 10 years ago same area  . CARDIAC CATHETERIZATION N/A 10/13/2014   Procedure: Left Heart Cath And Coronary Angiography;  Surgeon: Burnell Blanks, MD;  Location: [facility];  Service: Cardiovascular;  Laterality: N/A;  . CARDIAC CATHETERIZATION N/A 10/13/2014   Procedure: Coronary Stent Intervention;  Surgeon: Burnell Blanks, MD;  Location: [facility];  Service: Cardiovascular;  Laterality: N/A;   . COLONOSCOPY    . CORONARY ANGIOPLASTY WITH STENT PLACEMENT  10/13/2014   DES to [place]  . CORONARY ARTERY BYPASS GRAFT  1996   x 2  . POLYPECTOMY    . UPPER GASTROINTESTINAL ENDOSCOPY      Current Outpatient Prescriptions  Medication Sig Dispense Refill  . balsalazide (COLAZAL) 750 MG capsule TAKE ONE TABLET BY MOUTH THREE TIMES A DAY 270 capsule 1  . carvedilol (COREG) 3.125 MG tablet Take 1 tablet (3.125 mg total) by mouth 2 (two) times daily. 180 tablet 3  . cholecalciferol (VITAMIN D) 1000 UNITS tablet Take 1,000 Units by mouth daily.      . clopidogrel (PLAVIX) 75 MG tablet TAKE 1 TABLET (75 MG TOTAL) BY MOUTH DAILY WITH BREAKFAST. 90 tablet 3  . FLUoxetine (PROZAC) 10 MG capsule Take 10 mg by mouth daily.     Marland Kitchen LORazepam (ATIVAN) 0.5 MG tablet Take 0.5 mg by mouth as needed for anxiety (for anxiety). Reported on 12/04/2015    . Multiple Vitamins-Minerals (MULTIVITAMIN WITH MINERALS) tablet Take 1 tablet by mouth daily.      . nitroGLYCERIN (NITROSTAT) 0.4 MG SL tablet Place 1 tablet (0.4 mg total) under the tongue every 5 (five) minutes as needed for chest pain. 25 tablet 6  . simvastatin (ZOCOR) 10 MG tablet Take 1 tablet (10 mg total) by mouth  daily. 90 tablet 3  . valsartan-hydrochlorothiazide (DIOVAN-HCT) 160-25 MG tablet Take 1 tablet by mouth daily. 90 tablet 3  . pantoprazole (PROTONIX) 40 MG tablet Take 1 tablet (40 mg total) by mouth daily. 30 tablet 11   No current facility-administered medications for this visit.     No Known Allergies  Social History   Social History  . Marital status: Married    Spouse name: N/A  . Number of children: N/A  . Years of education: N/A   Occupational History  . Not on file.   Social History Main Topics  . Smoking status: Former Smoker    Quit date: 10/04/1994  . Smokeless tobacco: Never Used  . Alcohol use No  . Drug use: No  . Sexual activity: Not Currently   Other Topics Concern  . Not on file   Social  History Narrative  . No narrative on file    Family History  Problem Relation Age of Onset  . Lung cancer Brother   . Breast cancer Sister   . Kidney disease Brother     renal cancer  . Diabetes Mother   . Heart disease Mother   . Heart attack Mother 56  . Heart disease Sister   . Colon cancer Daughter   . Hypertension Mother   . Hypertension Sister   . Stroke Sister     Review of Systems:  As stated in the HPI and otherwise negative.   BP 124/72   Pulse 68   Ht 5' 6"  (1.676 m)   Wt 184 lb (83.5 kg)   BMI 29.70 kg/m   Physical Examination: General: Well developed, well nourished, NAD  HEENT: OP clear, mucus membranes moist  SKIN: warm, dry. No rashes. Neuro: No focal deficits  Musculoskeletal: Muscle strength 5/5 all ext  Psychiatric: Mood and affect normal  Neck: No JVD, no carotid bruits, no thyromegaly, no lymphadenopathy.  Lungs:Clear bilaterally, no wheezes, rhonci, crackles Cardiovascular: Regular rate and rhythm. No murmurs, gallops or rubs. Abdomen:Soft. Bowel sounds present. Non-tender.  Extremities: No lower extremity edema. Pulses are 2 + in the bilateral DP/PT.  Cardiac cath Oct 13, 2014: Left main: Diffuse 20% stenosis.  Left Anterior Descending Artery: Large caliber vessel that courses to the apex. The proximal vessel is occluded. The mid and distal vessel fills from the patent graft.  Circumflex Artery: Moderate caliber vessel with moderate caliber obtuse marginal branch. The mid vessel has a 99% stenosis.  Right Coronary Artery: Large dominant vessel with 100% mid occlusion. The distal vessel fills from the patent vein graft.  Graft Anatomy:  SVG to PDA is patent LIMA to mid LAD is patent Left Ventricular Angiogram: LVEF=60-65%.   EKG:  EKG is  ordered today. The ekg ordered today demonstrates NSR, rate 68 bpm. Poor R wave progression, unchanged.   Recent Labs: No results found for requested labs within last 8760 hours.   Lipid Panel      Component Value Date/Time   CHOL 143 03/24/2015 0938   TRIG 59 03/24/2015 0938   HDL 48 03/24/2015 0938   CHOLHDL 3.0 03/24/2015 0938   VLDL 12 03/24/2015 0938   LDLCALC 83 03/24/2015 0938     Wt Readings from Last 3 Encounters:  05/18/16 184 lb (83.5 kg)  01/06/16 180 lb 12.8 oz (82 kg)  12/04/15 184 lb (83.5 kg)     Other studies Reviewed: Additional studies/ records that were reviewed today include:  Review of the above records demonstrates:  Assessment and Plan:   1. CAD without angina: She is s/p 2V CABG in 1996. Last cardiac cath May 2016 at which time a DES was placed in the mid Circumflex. LIMA graft was patent to the LAD and the SVG was patent to the PDA. She is having mild substernal pressure that has been present continuously for 4 days. She has been having heartburn after meals for one month. Her pain does not seem to be cardiac related. I will start Protonix 40 mg daily. We will try this for one week. If pain persists despite treatment with PPI, will consider cardiac cath. Will send troponin today. Will continue DAPT with ASA and Plavix. Continue statin and beta blocker.   2. HTN: BP is stable. No changes.   3. Hyperlipidemia: Lipids have been well controlled on a statin. No changes.  Current medicines are reviewed at length with the patient today.  The patient does not have concerns regarding medicines.  The following changes have been made:  no change  Labs/ tests ordered today include:Cardiac cath  Orders Placed This Encounter  Procedures  . Troponin I  . EKG 12-Lead    Disposition:   FU with me in 6 months.   Signed, Lauree Chandler, MD 05/18/2016 3:04 PM    [facility] [address], [place], [place]  [address] Phone: [phone]; Fax: [phone]

## 2016-05-18 NOTE — Patient Instructions (Addendum)
Medication Instructions:   Your physician has recommended you make the following change in your medication:  Start Protonix 40 mg by mouth daily.     Labwork:  Have lab work done at [facility] today on the first floor  Testing/Procedures: none  Follow-Up:  Your physician recommends that you schedule a follow-up appointment in: 6 months. Please call our office in about 3 months to schedule this appointment.      Any Other Special Instructions Will Be Listed Below (If Applicable).  Call us next week to let us know how you are feeling.    If you need a refill on your cardiac medications before your next appointment, please call your pharmacy.

## 2016-05-18 NOTE — Telephone Encounter (Signed)
Thanks. cdm

## 2016-05-18 NOTE — Telephone Encounter (Signed)
I spoke with pt. She reports on Sunday she was at  [employer] and had "episode." Felt it was originally gas but took 2 NTG with relief.  Since then she has felt tired.  No pain at this time but no energy. She is requesting appt.  Appt made for pt to see Dr. Angelena Form today at 1:45.

## 2016-05-19 DIAGNOSIS — I2510 Atherosclerotic heart disease of native coronary artery without angina pectoris: Secondary | ICD-10-CM | POA: Diagnosis not present

## 2016-05-19 DIAGNOSIS — I10 Essential (primary) hypertension: Secondary | ICD-10-CM | POA: Diagnosis not present

## 2016-05-19 DIAGNOSIS — E559 Vitamin D deficiency, unspecified: Secondary | ICD-10-CM | POA: Diagnosis not present

## 2016-05-19 DIAGNOSIS — F419 Anxiety disorder, unspecified: Secondary | ICD-10-CM | POA: Diagnosis not present

## 2016-05-19 DIAGNOSIS — E785 Hyperlipidemia, unspecified: Secondary | ICD-10-CM | POA: Diagnosis not present

## 2016-05-19 LAB — TROPONIN I: Troponin I: <0.01 ng/mL (ref 0.00–0.04)

## 2016-09-08 DIAGNOSIS — H2513 Age-related nuclear cataract, bilateral: Secondary | ICD-10-CM | POA: Diagnosis not present

## 2016-09-08 DIAGNOSIS — H5203 Hypermetropia, bilateral: Secondary | ICD-10-CM | POA: Diagnosis not present

## 2016-09-08 DIAGNOSIS — H524 Presbyopia: Secondary | ICD-10-CM | POA: Diagnosis not present

## 2016-09-08 DIAGNOSIS — H52223 Regular astigmatism, bilateral: Secondary | ICD-10-CM | POA: Diagnosis not present

## 2016-10-13 DIAGNOSIS — Z1231 Encounter for screening mammogram for malignant neoplasm of breast: Secondary | ICD-10-CM | POA: Diagnosis not present

## 2016-10-13 DIAGNOSIS — Z803 Family history of malignant neoplasm of breast: Secondary | ICD-10-CM | POA: Diagnosis not present

## 2016-10-24 ENCOUNTER — Telehealth: Payer: Self-pay | Admitting: Cardiovascular Disease

## 2016-10-24 NOTE — Telephone Encounter (Signed)
I spoke with pt. She reports pressure in chest which started Saturday evening.  States reflux improved after starting PPI after last office visit. This pressure does not feel like reflux symptoms.  States it feels like prior to her stent placement.  I told pt she needed to call 911 for transport to emergency room.

## 2016-10-24 NOTE — Telephone Encounter (Signed)
New message    Pt is calling.   Pt c/o of Chest Pain: STAT if CP now or developed within 24 hours  1. Are you having CP right now? Yes-chest pressure  2. Are you experiencing any other symptoms (ex. SOB, nausea, vomiting, sweating)? No  3. How long have you been experiencing CP? Pt states she has been having pressure for 2 days. She said it has not gotten any worse or better.  4. Is your CP continuous or coming and going? Continuous   5. Have you taken Nitroglycerin? No. She said she is not in pain, just pressure. ?

## 2016-11-21 DIAGNOSIS — F419 Anxiety disorder, unspecified: Secondary | ICD-10-CM | POA: Diagnosis not present

## 2016-11-21 DIAGNOSIS — I10 Essential (primary) hypertension: Secondary | ICD-10-CM | POA: Diagnosis not present

## 2016-11-21 DIAGNOSIS — E785 Hyperlipidemia, unspecified: Secondary | ICD-10-CM | POA: Diagnosis not present

## 2016-11-21 DIAGNOSIS — F3342 Major depressive disorder, recurrent, in full remission: Secondary | ICD-10-CM | POA: Diagnosis not present

## 2016-11-21 DIAGNOSIS — R739 Hyperglycemia, unspecified: Secondary | ICD-10-CM | POA: Diagnosis not present

## 2016-12-06 DIAGNOSIS — E119 Type 2 diabetes mellitus without complications: Secondary | ICD-10-CM | POA: Diagnosis not present

## 2016-12-06 DIAGNOSIS — Z7984 Long term (current) use of oral hypoglycemic drugs: Secondary | ICD-10-CM | POA: Diagnosis not present

## 2016-12-19 ENCOUNTER — Other Ambulatory Visit: Payer: Self-pay | Admitting: Cardiovascular Disease

## 2016-12-19 MED ORDER — CARVEDILOL 3.125 MG PO TABS: 3.1250 mg | ORAL_TABLET | Freq: Two times a day (BID) | ORAL | 1 refills | Status: DC

## 2016-12-31 DIAGNOSIS — M25561 Pain in right knee: Secondary | ICD-10-CM | POA: Diagnosis not present

## 2017-01-06 DIAGNOSIS — M25561 Pain in right knee: Secondary | ICD-10-CM | POA: Diagnosis not present

## 2017-01-09 ENCOUNTER — Ambulatory Visit: Payer: PPO | Admitting: Cardiovascular Disease

## 2017-01-12 DIAGNOSIS — S83241A Other tear of medial meniscus, current injury, right knee, initial encounter: Secondary | ICD-10-CM | POA: Diagnosis not present

## 2017-01-12 DIAGNOSIS — M25561 Pain in right knee: Secondary | ICD-10-CM | POA: Diagnosis not present

## 2017-01-18 ENCOUNTER — Encounter: Payer: Self-pay | Admitting: Nurse Practitioner

## 2017-01-18 ENCOUNTER — Ambulatory Visit (INDEPENDENT_AMBULATORY_CARE_PROVIDER_SITE_OTHER): Payer: PPO | Admitting: Nurse Practitioner

## 2017-01-18 VITALS — BP 106/60 | HR 79 | Ht 66.0 in | Wt 173.2 lb

## 2017-01-18 DIAGNOSIS — E7800 Pure hypercholesterolemia, unspecified: Secondary | ICD-10-CM

## 2017-01-18 DIAGNOSIS — I2510 Atherosclerotic heart disease of native coronary artery without angina pectoris: Secondary | ICD-10-CM

## 2017-01-18 DIAGNOSIS — I10 Essential (primary) hypertension: Secondary | ICD-10-CM | POA: Diagnosis not present

## 2017-01-18 MED ORDER — LOSARTAN POTASSIUM-HCTZ 100-25 MG PO TABS: 1.0000 | ORAL_TABLET | Freq: Every day | ORAL | 6 refills | Status: DC

## 2017-01-18 MED ORDER — LOSARTAN POTASSIUM-HCTZ 100-25 MG PO TABS: 1.0000 | ORAL_TABLET | Freq: Every day | ORAL | 3 refills | Status: DC

## 2017-01-18 NOTE — Patient Instructions (Addendum)
We will be checking the following labs today - NONE  Medication Instructions:    Continue with your current medicines. BUT due to the Valsartan recall - changing to   Hyzaar 100-25 mg to take one a day - this has been sent to your mail order and your local pharmacy.     Testing/Procedures To Be Arranged:  N/A  Follow-Up:   See Dr. Angelena Form in December     Other Special Instructions:   N/A    If you need a refill on your cardiac medications before your next appointment, please call your pharmacy.   Call the [place] office at [phone] if you have any questions, problems or concerns.

## 2017-01-18 NOTE — Progress Notes (Signed)
CARDIOLOGY OFFICE NOTE  Date:  01/18/2017    [name] [name] Date of Birth: [birth year] Medical Record #[id number]  PCP:  [name], MD  Cardiologist:  Aldona Bar   Chief Complaint  Patient presents with  . Coronary Artery Disease    Follow up visit - seen for Dr. Angelena Form    History of Present Illness: [name] [name] is a 71 y.o. female who presents today for a follow up visit. Seen for Dr. Angelena Form. Former patient of Dr. Susa Simmonds.   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 Last cath May 2016. She was found to have a severe stenosis in the mid Circumflex treated with a 2.5 x 16 mm Promus Premier DES. The LIMA was patent to the LAD and the SVG was patent to the PDA.   Seen last by Dr. Angelena Form back in December - she was having chest pain - did not seem to be cardiac related - PPI therapy was started. She improved.   Comes in today. Here with [name] [name] today. Fasting today. Has retired. She has had some knee issues but still trying to walk regularly. She has lost weight. Down 11 pounds. She is now diabetic - A1C is 7 - she is now on medicines. Plans to lose another 10 pounds. No actual chest pain.  Breathing is good. She has had some pain in the left upper shoulder - clearly with touching it - it is sore. No exertional symptoms. She is on Valsartan HCTZ.   Past Medical History:  Diagnosis Date  . Adenomatous colon polyp 11/2005  . Anginal pain ([place])   . Arthritis   . CAD (coronary artery disease)    2V CABG 1996 (LIMA to LAD, SVG to RCA) Dr. Servando Snare.; last stress test in 2010  . Headache   . Hyperlipidemia   . Hypertension   . Muscle cramps    and pains  . Myocardial infarction ([place])   . Ulcerative colitis     Past Surgical History:  Procedure Laterality Date  . BACK SURGERY     lumbar fusion 25 years ago and ruptured disk 10 years ago  same area  . CARDIAC CATHETERIZATION N/A 10/13/2014   Procedure: Left Heart Cath And Coronary Angiography;  Surgeon: Burnell Blanks, MD;  Location: [place] [facility] CUPID;  Service: Cardiovascular;  Laterality: N/A;  . CARDIAC CATHETERIZATION N/A 10/13/2014   Procedure: Coronary Stent Intervention;  Surgeon: Burnell Blanks, MD;  Location: [place] INVASIVE CV LAB CUPID;  Service: Cardiovascular;  Laterality: N/A;  . COLONOSCOPY    . CORONARY ANGIOPLASTY WITH STENT PLACEMENT  10/13/2014   DES to [place]  . CORONARY ARTERY BYPASS GRAFT  1996   x 2  . POLYPECTOMY    . UPPER GASTROINTESTINAL ENDOSCOPY       Medications: Current Meds  Medication Sig  . balsalazide (COLAZAL) 750 MG capsule TAKE ONE TABLET BY MOUTH THREE TIMES A DAY  . carvedilol (COREG) 3.125 MG tablet Take 1 tablet (3.125 mg total) by mouth 2 (two) times daily.  . cholecalciferol (VITAMIN D) 1000 UNITS tablet Take 1,000 Units by mouth daily.    . clopidogrel (PLAVIX) 75 MG tablet TAKE 1 TABLET (75 MG TOTAL) BY MOUTH DAILY WITH BREAKFAST.  Marland Kitchen FLUoxetine (PROZAC) 10 MG capsule Take 10 mg by mouth daily.   Marland Kitchen LORazepam (ATIVAN) 0.5  MG tablet Take 0.5 mg by mouth as needed for anxiety (for anxiety). Reported on 12/04/2015  . Multiple Vitamins-Minerals (MULTIVITAMIN WITH MINERALS) tablet Take 1 tablet by mouth daily.    . nitroGLYCERIN (NITROSTAT) 0.4 MG SL tablet Place 1 tablet (0.4 mg total) under the tongue every 5 (five) minutes as needed for chest pain.  . pantoprazole (PROTONIX) 40 MG tablet Take 1 tablet (40 mg total) by mouth daily.  . simvastatin (ZOCOR) 10 MG tablet Take 1 tablet (10 mg total) by mouth daily.  . [DISCONTINUED] valsartan-hydrochlorothiazide (DIOVAN-HCT) 160-25 MG tablet Take 1 tablet by mouth daily.     Allergies: No Known Allergies  Social History: The patient  reports that she quit smoking about 22 years ago. She has never used smokeless tobacco. She reports that she does not drink  alcohol or use drugs.   Family History: The patient's family history includes Breast cancer in her sister; Colon cancer in her daughter; Diabetes in her mother; Heart attack (age of onset: 41) in her mother; Heart disease in her mother and sister; Hypertension in her mother and sister; Kidney disease in her brother; Lung cancer in her brother; Stroke in her sister.   Review of Systems: Please see the history of present illness.   Otherwise, the review of systems is positive for none.   All other systems are reviewed and negative.   Physical Exam: VS:  BP 106/60 (BP Location: Left Arm, Patient Position: Sitting, Cuff Size: Normal)   Pulse 79   Ht 5\' 6"  (1.676 m)   Wt 173 lb 3.2 oz (78.6 kg)   BMI 27.96 kg/m  .  BMI Body mass index is 27.96 kg/m.  Wt Readings from Last 3 Encounters:  01/18/17 173 lb 3.2 oz (78.6 kg)  05/18/16 184 lb (83.5 kg)  01/06/16 180 lb 12.8 oz (82 kg)    General: Pleasant. Well developed, well nourished and in no acute distress.   HEENT: Normal.  Neck: Supple, no JVD, carotid bruits, or masses noted.  Cardiac: Regular rate and rhythm. No murmurs, rubs, or gallops. No edema.  Respiratory:  Lungs are clear to auscultation bilaterally with normal work of breathing.  GI: Soft and nontender.  MS: No deformity or atrophy. Gait and ROM intact.  Skin: Warm and dry. Color is normal.  Neuro:  Strength and sensation are intact and no gross focal deficits noted.  Psych: Alert, appropriate and with normal affect.   LABORATORY DATA:  EKG:  EKG is ordered today. This shows NSR with septal Q's.  Lab Results  Component Value Date   WBC 6.2 11/18/2014   HGB 12.9 11/18/2014   HCT 38.6 11/18/2014   PLT 259.0 11/18/2014   GLUCOSE 106 (H) 03/24/2015   CHOL 143 03/24/2015   TRIG 59 03/24/2015   HDL 48 03/24/2015   LDLCALC 83 03/24/2015   ALT 17 03/24/2015   AST 19 03/24/2015   NA 141 03/24/2015   K 4.2 03/24/2015   CL 101 03/24/2015   CREATININE 0.84 03/24/2015     BUN 16 03/24/2015   CO2 31 03/24/2015   TSH 0.40 12/12/2011   INR 1.0 10/10/2014     BNP (last 3 results) No results for input(s): BNP in the last 8760 hours.  ProBNP (last 3 results) No results for input(s): PROBNP in the last 8760 hours.   Other Studies Reviewed Today:  Cardiac cath Oct 13, 2014: Left main: Diffuse 20% stenosis.  Left Anterior Descending Artery: Large caliber vessel that courses  to the apex. The proximal vessel is occluded. The mid and distal vessel fills from the patent graft.  Circumflex Artery: Moderate caliber vessel with moderate caliber obtuse marginal branch. The mid vessel has a 99% stenosis.  Right Coronary Artery: Large dominant vessel with 100% mid occlusion. The distal vessel fills from the patent vein graft.  Graft Anatomy:  SVG to PDA is patent LIMA to mid LAD is patent Left Ventricular Angiogram: LVEF=60-65%.    Assessment/Plan:  1. CAD without angina: She is s/p 2V CABG in 1996. Last cardiac cath May 2016 at which time a DES was placed in the mid Circumflex. LIMA graft was patent to the LAD and the SVG was patent to the PDA. She is doing well. No angina. Her shoulder pain is reproducible on exam with palpitation.   2. HTN: BP is stable. Her Valsartan HCTZ has been changed to Hyzaar 100-25 today. She will need to monitor BP. If she continues to lose weight - may be able to cut BP meds back  3. Hyperlipidemia: Lipids have been well controlled on a statin. No changes. Labs from PCP noted from June. She does not need this repeated today.   4. NIDDM - working on weight loss   Current medicines are reviewed with the patient today.  The patient does not have concerns regarding medicines other than what has been noted above.  The following changes have been made:  See above.  Labs/ tests ordered today include:    Orders Placed This Encounter  Procedures  . EKG 12-Lead     Disposition:   FU with me or Dr. Angelena Form in 6 months.    Patient is agreeable to this plan and will call if any problems develop in the interim.   SignedTruitt Merle, NP  01/18/2017 10:37 AM  [facility] [address] [place], [place]  [address] Phone: [phone] Fax: [phone]

## 2017-02-04 ENCOUNTER — Other Ambulatory Visit: Payer: Self-pay | Admitting: Cardiovascular Disease

## 2017-02-17 ENCOUNTER — Other Ambulatory Visit: Payer: Self-pay | Admitting: Gastroenterology

## 2017-03-06 DIAGNOSIS — R300 Dysuria: Secondary | ICD-10-CM | POA: Diagnosis not present

## 2017-03-06 DIAGNOSIS — N3000 Acute cystitis without hematuria: Secondary | ICD-10-CM | POA: Diagnosis not present

## 2017-03-07 DIAGNOSIS — R300 Dysuria: Secondary | ICD-10-CM | POA: Diagnosis not present

## 2017-03-08 DIAGNOSIS — E119 Type 2 diabetes mellitus without complications: Secondary | ICD-10-CM | POA: Diagnosis not present

## 2017-03-08 DIAGNOSIS — Z7984 Long term (current) use of oral hypoglycemic drugs: Secondary | ICD-10-CM | POA: Diagnosis not present

## 2017-03-08 DIAGNOSIS — Z23 Encounter for immunization: Secondary | ICD-10-CM | POA: Diagnosis not present

## 2017-03-16 DIAGNOSIS — Z01419 Encounter for gynecological examination (general) (routine) without abnormal findings: Secondary | ICD-10-CM | POA: Diagnosis not present

## 2017-03-16 DIAGNOSIS — Z1389 Encounter for screening for other disorder: Secondary | ICD-10-CM | POA: Diagnosis not present

## 2017-04-10 ENCOUNTER — Encounter: Payer: Self-pay | Admitting: Gastroenterology

## 2017-04-10 ENCOUNTER — Ambulatory Visit (INDEPENDENT_AMBULATORY_CARE_PROVIDER_SITE_OTHER): Payer: PPO | Admitting: Gastroenterology

## 2017-04-10 VITALS — BP 104/58 | HR 75 | Ht 66.0 in | Wt 170.8 lb

## 2017-04-10 DIAGNOSIS — Z86010 Personal history of colonic polyps: Secondary | ICD-10-CM | POA: Diagnosis not present

## 2017-04-10 DIAGNOSIS — K219 Gastro-esophageal reflux disease without esophagitis: Secondary | ICD-10-CM | POA: Diagnosis not present

## 2017-04-10 DIAGNOSIS — K5904 Chronic idiopathic constipation: Secondary | ICD-10-CM | POA: Diagnosis not present

## 2017-04-10 DIAGNOSIS — K5130 Ulcerative (chronic) rectosigmoiditis without complications: Secondary | ICD-10-CM

## 2017-04-10 MED ORDER — BALSALAZIDE DISODIUM 750 MG PO CAPS: 1500.0000 mg | ORAL_CAPSULE | Freq: Two times a day (BID) | ORAL | 3 refills | Status: DC

## 2017-04-10 MED ORDER — BALSALAZIDE DISODIUM 750 MG PO CAPS: 1500.0000 mg | ORAL_CAPSULE | Freq: Two times a day (BID) | ORAL | 12 refills | Status: DC

## 2017-04-10 NOTE — Progress Notes (Signed)
    History of Present Illness: This is a 71 year old female with left sided UC and GERD.  Last colonoscopy was performed in June 2017 did not show any endoscopic evidence of inflammation.  Biopsies were normal.  3 small tubular adenomas were removed.  She has no gastrointestinal complaints except for mild constipation.  Blood work performed through Dr. Hal Hope Smith's office reviewed.  Current Medications, Allergies, Past Medical History, Past Surgical History, Family History and Social History were reviewed in [employer] [facility] record.  Physical Exam: General: Well developed, well nourished, no acute distress Head: Normocephalic and atraumatic Eyes:  sclerae anicteric, EOMI Ears: Normal auditory acuity Mouth: No deformity or lesions Lungs: Clear throughout to auscultation Heart: Regular rate and rhythm; no murmurs, rubs or bruits Abdomen: Soft, non tender and non distended. No masses, hepatosplenomegaly or hernias noted. Normal Bowel sounds Rectal: not done Musculoskeletal: Symmetrical with no gross deformities  Pulses:  Normal pulses noted Extremities: No clubbing, cyanosis, edema or deformities noted Neurological: Alert oriented x 4, grossly nonfocal Psychological:  Alert and cooperative. Normal mood and affect  Assessment and Recommendations:  1. Left sided UC.  No active inflammation on last colonoscopy.  Patient would like to decrease her dose of balsalazide and after discussion of the risks/benefits she would like to decrease to 1.5 g twice daily.  If any symptoms return she is advised to call. REV in 1 year.   2. GERD.  Continue standard antireflux measures and pantoprazole 40 mg daily. REV in 1 year.   3. Personal history of adenomatous colon polyps.  Surveillance colonoscopy for polyps and history of UC at a 3-year interval is due in June 2020.  4.  Mild constipation.  Increase daily dietary fiber and daily water intake.  If this is not effective begin Colace  once or twice daily.  If still not adequately addressed begin MiraLAX once daily.

## 2017-04-10 NOTE — Patient Instructions (Addendum)
If you are age 70 or older, your body mass index should be between 23-30. Your Body mass index is 27.57 kg/m. If this is out of the aforementioned range listed, please consider follow up with your Primary Care Provider.  If you are age 66 or younger, your body mass index should be between 19-25. Your Body mass index is 27.57 kg/m. If this is out of the aformentioned range listed, please consider follow up with your Primary Care Provider.   We have sent the following medications to your pharmacy for you to pick up at your convenience:  Balsalazide  Thank you

## 2017-05-06 ENCOUNTER — Other Ambulatory Visit: Payer: Self-pay | Admitting: Cardiovascular Disease

## 2017-05-15 ENCOUNTER — Other Ambulatory Visit: Payer: Self-pay | Admitting: Gastroenterology

## 2017-05-21 ENCOUNTER — Other Ambulatory Visit: Payer: Self-pay | Admitting: Nurse Practitioner

## 2017-05-23 NOTE — Progress Notes (Signed)
Chief Complaint  Patient presents with  . Coronary Artery Disease   History of Present Illness: 71 yo female with h/o CAD with acute anterior MI April 1996 with angioplasty, CABG June 1996 (LIMA to LAD, SVG to RCA), stenting Circumflex 2016, HLD, HTN, ulcerative colitis here today for cardiology followup. Last cardiac cath in May 2016 and she was found to have a severe stenosis in the mid Circumflex treated with a drug eluting stent. The LIMA was patent to the LAD and the SVG was patent to the PDA.   She is here today for follow up. The patient denies any chest pain, dyspnea, palpitations, lower extremity edema, orthopnea, PND, dizziness, near syncope or syncope.   Primary Care Physician: [name], MD   Past Medical History:  Diagnosis Date  . Adenomatous colon polyp 11/2005  . Anginal pain ([place])   . Arthritis   . CAD (coronary artery disease)    2V CABG 1996 (LIMA to LAD, SVG to RCA) Dr. Servando Snare.; last stress test in 2010  . Headache   . Hyperlipidemia   . Hypertension   . Muscle cramps    and pains  . Myocardial infarction ([place])   . Pre-diabetes   . Ulcerative colitis     Past Surgical History:  Procedure Laterality Date  . BACK SURGERY     lumbar fusion 25 years ago and ruptured disk 10 years ago same area  . CARDIAC CATHETERIZATION N/A 10/13/2014   Procedure: Left Heart Cath And Coronary Angiography;  Surgeon: Burnell Blanks, MD;  Location: [place] [facility] CUP[facility];  Service: Cardiovascular;  Laterality: N/A;  . CARDIAC CATHETERIZATION N/A 10/13/2014   Procedure: Coronary Stent Intervention;  Surgeon: Burnell Blanks, MD;  Location: [place] INVASIVE CV LAB CUPID;  Service: Cardiovascular;  Laterality: N/A;  . COLONOSCOPY    . CORONARY ANGIOPLASTY WITH STENT PLACEMENT  10/13/2014   DES to [place]  . CORONARY ARTERY BYPASS GRAFT  1996   x 2  . POLYPECTOMY    . UPPER GASTROINTESTINAL ENDOSCOPY      Current Outpatient Medications  Medication Sig  Dispense Refill  . balsalazide (COLAZAL) 750 MG capsule Take 2 capsules (1,500 mg total) by mouth 2 (two) times daily. 120 capsule 12  . balsalazide (COLAZAL) 750 MG capsule TAKE ONE CAPSULE BY MOUTH 3 TIMES A DAY *NEEDS APPT FOR REFILLS 270 capsule 2  . carvedilol (COREG) 3.125 MG tablet Take 1 tablet (3.125 mg total) by mouth 2 (two) times daily. 180 tablet 1  . cholecalciferol (VITAMIN D) 1000 UNITS tablet Take 1,000 Units by mouth daily.      . clopidogrel (PLAVIX) 75 MG tablet TAKE 1 TABLET (75 MG TOTAL) BY MOUTH DAILY WITH BREAKFAST. 90 tablet 3  . FLUoxetine (PROZAC) 10 MG capsule Take 10 mg by mouth daily.     Marland Kitchen LORazepam (ATIVAN) 0.5 MG tablet Take 0.5 mg by mouth as needed for anxiety (for anxiety). Reported on 12/04/2015    . losartan-hydrochlorothiazide (HYZAAR) 100-25 MG tablet TAKE 1 TABLET BY MOUTH EVERY DAY 90 tablet 1  . metFORMIN (GLUCOPHAGE) 500 MG tablet Take 500 mg by mouth daily.    . Multiple Vitamins-Minerals (MULTIVITAMIN WITH MINERALS) tablet Take 1 tablet by mouth daily.      . pantoprazole (PROTONIX) 40 MG tablet TAKE 1 TABLET EVERY DAY 30 tablet 8  . nitroGLYCERIN (NITROSTAT) 0.4 MG SL tablet Place 1 tablet (0.4 mg total) under the tongue every 5 (five) minutes as needed  for chest pain. 25 tablet 6  . simvastatin (ZOCOR) 20 MG tablet Take 1 tablet (20 mg total) by mouth at bedtime. 90 tablet 3   No current facility-administered medications for this visit.     No Known Allergies  Social History   Socioeconomic History  . Marital status: Married    Spouse name: Not on file  . Number of children: Not on file  . Years of education: Not on file  . Highest education level: Not on file  Social Needs  . Financial resource strain: Not on file  . Food insecurity - worry: Not on file  . Food insecurity - inability: Not on file  . Transportation needs - medical: Not on file  . Transportation needs - non-medical: Not on file  Occupational History  . Not on file    Tobacco Use  . Smoking status: Former Smoker    Last attempt to quit: 10/04/1994    Years since quitting: 22.6  . Smokeless tobacco: Never Used  Substance and Sexual Activity  . Alcohol use: No  . Drug use: No  . Sexual activity: Not Currently    Partners: Male  Other Topics Concern  . Not on file  Social History Narrative  . Not on file    Family History  Problem Relation Age of Onset  . Diabetes Mother   . Heart disease Mother   . Heart attack Mother 39  . Hypertension Mother   . Lung cancer Brother   . Breast cancer Sister   . Kidney disease Brother        renal cancer  . Heart disease Sister   . Colon cancer Daughter   . Hypertension Sister   . Stroke Sister   . Pancreatitis Other   . Stomach cancer Neg Hx     Review of Systems:  As stated in the HPI and otherwise negative.   BP 118/60   Pulse 71   Ht 5' 6"  (1.676 m)   Wt 172 lb (78 kg)   SpO2 97%   BMI 27.76 kg/m   Physical Examination:  General: Well developed, well nourished, NAD  HEENT: OP clear, mucus membranes moist  SKIN: warm, dry. No rashes. Neuro: No focal deficits  Musculoskeletal: Muscle strength 5/5 all ext  Psychiatric: Mood and affect normal  Neck: No JVD, no carotid bruits, no thyromegaly, no lymphadenopathy.  Lungs:Clear bilaterally, no wheezes, rhonci, crackles Cardiovascular: Regular rate and rhythm. No murmurs, gallops or rubs. Abdomen:Soft. Bowel sounds present. Non-tender.  Extremities: No lower extremity edema. Pulses are 2 + in the bilateral DP/PT.  Cardiac cath Oct 13, 2014: Left main: Diffuse 20% stenosis.  Left Anterior Descending Artery: Large caliber vessel that courses to the apex. The proximal vessel is occluded. The mid and distal vessel fills from the patent graft.  Circumflex Artery: Moderate caliber vessel with moderate caliber obtuse marginal branch. The mid vessel has a 99% stenosis.  Right Coronary Artery: Large dominant vessel with 100% mid occlusion. The  distal vessel fills from the patent vein graft.  Graft Anatomy:  SVG to PDA is patent LIMA to mid LAD is patent Left Ventricular Angiogram: LVEF=60-65%.   EKG:  EKG is not ordered today. The ekg ordered today demonstrates   Recent Labs: No results found for requested labs within last 8760 hours.   Lipid Panel    Component Value Date/Time   CHOL 143 03/24/2015 0938   TRIG 59 03/24/2015 0938   HDL 48 03/24/2015 3016  CHOLHDL 3.0 03/24/2015 0938   VLDL 12 03/24/2015 0938   LDLCALC 83 03/24/2015 0938     Wt Readings from Last 3 Encounters:  05/25/17 172 lb (78 kg)  04/10/17 170 lb 12.8 oz (77.5 kg)  01/18/17 173 lb 3.2 oz (78.6 kg)     Other studies Reviewed: Additional studies/ records that were reviewed today include:  Review of the above records demonstrates:    Assessment and Plan:   1. CAD without angina: She is s/p 2V CABG in 1996. Last cardiac cath May 2016 at which time a DES was placed in the mid Circumflex. LIMA graft was patent to the LAD and the SVG was patent to the PDA. No chest pain suggestive of angina. Will continue ASA, Plavix, statin and beta blocker.  Will arrange echo to assess LVEF, LV size and exclude valve disease.   2. HTN: BP is controlled. No changes today.   3. Hyperlipidemia: LDL not at goal of 70. Will increase simvastatin to 20 mg daily and repeat lipids and LFTS in 12 weeks.   Current medicines are reviewed at length with the patient today.  The patient does not have concerns regarding medicines.  The following changes have been made:  no change  Labs/ tests ordered today include:Cardiac cath  Orders Placed This Encounter  Procedures  . Lipid Profile  . Hepatic function panel  . ECHOCARDIOGRAM COMPLETE    Disposition:   FU with me in 6 months.   Signed, Lauree Chandler, MD 05/25/2017 10:59 AM    [facility] [place], [place], [place]  [address] Phone: [phone]; Fax: [phone]

## 2017-05-25 ENCOUNTER — Ambulatory Visit: Payer: PPO | Admitting: Cardiovascular Disease

## 2017-05-25 ENCOUNTER — Encounter: Payer: Self-pay | Admitting: Cardiovascular Disease

## 2017-05-25 VITALS — BP 118/60 | HR 71 | Ht 66.0 in | Wt 172.0 lb

## 2017-05-25 DIAGNOSIS — I2510 Atherosclerotic heart disease of native coronary artery without angina pectoris: Secondary | ICD-10-CM | POA: Diagnosis not present

## 2017-05-25 DIAGNOSIS — E7800 Pure hypercholesterolemia, unspecified: Secondary | ICD-10-CM | POA: Diagnosis not present

## 2017-05-25 DIAGNOSIS — I10 Essential (primary) hypertension: Secondary | ICD-10-CM

## 2017-05-25 MED ORDER — SIMVASTATIN 20 MG PO TABS: 20.0000 mg | ORAL_TABLET | Freq: Every day | ORAL | 3 refills | Status: DC

## 2017-05-25 NOTE — Patient Instructions (Signed)
Medication Instructions:  Your physician has recommended you make the following change in your medication: Increase simvastatin to 20 mg by mouth daily.   Labwork: Your physician recommends that you return for lab work in: 12 weeks.  (lipid and liver profiles).  This will be fasting.   Testing/Procedures: Your physician has requested that you have an echocardiogram. Echocardiography is a painless test that uses sound waves to create images of your heart. It provides your doctor with information about the size and shape of your heart and how well your heart's chambers and valves are working. This procedure takes approximately one hour. There are no restrictions for this procedure. To be done in January 2019   Follow-Up: Your physician recommends that you schedule a follow-up appointment in: 6 months. Please call our office in about 3 months to schedule this appointment    Any Other Special Instructions Will Be Listed Below (If Applicable).     If you need a refill on your cardiac medications before your next appointment, please call your pharmacy.

## 2017-05-30 DIAGNOSIS — F3342 Major depressive disorder, recurrent, in full remission: Secondary | ICD-10-CM | POA: Diagnosis not present

## 2017-05-30 DIAGNOSIS — E119 Type 2 diabetes mellitus without complications: Secondary | ICD-10-CM | POA: Diagnosis not present

## 2017-05-30 DIAGNOSIS — Z7984 Long term (current) use of oral hypoglycemic drugs: Secondary | ICD-10-CM | POA: Diagnosis not present

## 2017-05-30 DIAGNOSIS — E785 Hyperlipidemia, unspecified: Secondary | ICD-10-CM | POA: Diagnosis not present

## 2017-05-30 DIAGNOSIS — I10 Essential (primary) hypertension: Secondary | ICD-10-CM | POA: Diagnosis not present

## 2017-05-30 DIAGNOSIS — I2510 Atherosclerotic heart disease of native coronary artery without angina pectoris: Secondary | ICD-10-CM | POA: Diagnosis not present

## 2017-05-30 DIAGNOSIS — Z0000 Encounter for general adult medical examination without abnormal findings: Secondary | ICD-10-CM | POA: Diagnosis not present

## 2017-05-30 DIAGNOSIS — Z1389 Encounter for screening for other disorder: Secondary | ICD-10-CM | POA: Diagnosis not present

## 2017-06-29 ENCOUNTER — Other Ambulatory Visit: Payer: Self-pay

## 2017-06-29 ENCOUNTER — Ambulatory Visit (HOSPITAL_COMMUNITY): Payer: PPO | Attending: Cardiovascular Disease

## 2017-06-29 DIAGNOSIS — I2510 Atherosclerotic heart disease of native coronary artery without angina pectoris: Secondary | ICD-10-CM | POA: Insufficient documentation

## 2017-07-18 ENCOUNTER — Telehealth: Payer: Self-pay | Admitting: Cardiovascular Disease

## 2017-07-18 NOTE — Telephone Encounter (Signed)
Attempted to call the pt back and phone continued to ring, with no answer and no VM set-up.

## 2017-07-18 NOTE — Telephone Encounter (Signed)
New Message   Patient is wanting to discuss whether or not its okay for her to join a gym. If so what type of cardio or activities would be beneficial to her. Please call to discuss.

## 2017-07-19 NOTE — Telephone Encounter (Signed)
I spoke with pt and gave her information from Dr. McAlhany 

## 2017-07-19 NOTE — Telephone Encounter (Signed)
OK to join a gym. I would recommend swimming, cycling or walking on treadmill. Light weights would be good too. cdm

## 2017-07-21 ENCOUNTER — Other Ambulatory Visit: Payer: Self-pay

## 2017-07-21 ENCOUNTER — Encounter (HOSPITAL_COMMUNITY): Payer: Self-pay

## 2017-07-21 ENCOUNTER — Emergency Department (HOSPITAL_COMMUNITY): Payer: No Typology Code available for payment source

## 2017-07-21 ENCOUNTER — Emergency Department (HOSPITAL_COMMUNITY)
Admission: EM | Admit: 2017-07-21 | Discharge: 2017-07-22 | Disposition: A | Discharge status: Home / Self Care | Payer: No Typology Code available for payment source | Attending: Emergency Medicine | Admitting: Emergency Medicine

## 2017-07-21 DIAGNOSIS — Y9389 Activity, other specified: Secondary | ICD-10-CM | POA: Insufficient documentation

## 2017-07-21 DIAGNOSIS — M542 Cervicalgia: Secondary | ICD-10-CM | POA: Diagnosis not present

## 2017-07-21 DIAGNOSIS — I10 Essential (primary) hypertension: Secondary | ICD-10-CM | POA: Insufficient documentation

## 2017-07-21 DIAGNOSIS — R110 Nausea: Secondary | ICD-10-CM | POA: Diagnosis not present

## 2017-07-21 DIAGNOSIS — Z79899 Other long term (current) drug therapy: Secondary | ICD-10-CM | POA: Insufficient documentation

## 2017-07-21 DIAGNOSIS — I252 Old myocardial infarction: Secondary | ICD-10-CM | POA: Diagnosis not present

## 2017-07-21 DIAGNOSIS — Y92410 Unspecified street and highway as the place of occurrence of the external cause: Secondary | ICD-10-CM | POA: Diagnosis not present

## 2017-07-21 DIAGNOSIS — S199XXA Unspecified injury of neck, initial encounter: Secondary | ICD-10-CM | POA: Diagnosis not present

## 2017-07-21 DIAGNOSIS — Y998 Other external cause status: Secondary | ICD-10-CM | POA: Insufficient documentation

## 2017-07-21 DIAGNOSIS — Z955 Presence of coronary angioplasty implant and graft: Secondary | ICD-10-CM | POA: Diagnosis not present

## 2017-07-21 DIAGNOSIS — S0003XA Contusion of scalp, initial encounter: Secondary | ICD-10-CM | POA: Diagnosis not present

## 2017-07-21 DIAGNOSIS — S0990XA Unspecified injury of head, initial encounter: Secondary | ICD-10-CM | POA: Diagnosis not present

## 2017-07-21 DIAGNOSIS — Z951 Presence of aortocoronary bypass graft: Secondary | ICD-10-CM | POA: Diagnosis not present

## 2017-07-21 DIAGNOSIS — I2510 Atherosclerotic heart disease of native coronary artery without angina pectoris: Secondary | ICD-10-CM | POA: Insufficient documentation

## 2017-07-21 DIAGNOSIS — Z7902 Long term (current) use of antithrombotics/antiplatelets: Secondary | ICD-10-CM | POA: Insufficient documentation

## 2017-07-21 DIAGNOSIS — Z87891 Personal history of nicotine dependence: Secondary | ICD-10-CM | POA: Diagnosis not present

## 2017-07-21 DIAGNOSIS — R51 Headache: Secondary | ICD-10-CM | POA: Diagnosis not present

## 2017-07-21 MED ORDER — METHOCARBAMOL 500 MG PO TABS
500.0000 mg | ORAL_TABLET | Freq: Once | ORAL | Status: AC
  Administered 2017-07-22: 500 mg via ORAL
  Filled 2017-07-21: qty 1

## 2017-07-21 NOTE — ED Notes (Signed)
Pt in Ct  
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
## 2017-07-21 NOTE — ED Notes (Signed)
Bed: WA25 Expected date:  Expected time:  Means of arrival:  Comments: Triage 

## 2017-07-22 DIAGNOSIS — S0003XA Contusion of scalp, initial encounter: Secondary | ICD-10-CM | POA: Diagnosis not present

## 2017-07-22 MED ORDER — METHOCARBAMOL 500 MG PO TABS: 500.0000 mg | ORAL_TABLET | Freq: Three times a day (TID) | ORAL | 0 refills | Status: DC | PRN
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
## 2017-07-22 NOTE — Discharge Instructions (Signed)
Ice packs to the injured or sore muscles for the next several days then start using heat. Take the medications for muscle spasms with acetaminophen 500 mg 3 times a day.  Return to the ED for any problems listed on the head injury sheet. Recheck if you aren't improving in the next week.

## 2017-08-17 ENCOUNTER — Other Ambulatory Visit: Payer: PPO | Admitting: *Deleted

## 2017-08-17 DIAGNOSIS — E7800 Pure hypercholesterolemia, unspecified: Secondary | ICD-10-CM | POA: Diagnosis not present

## 2017-08-17 DIAGNOSIS — I2510 Atherosclerotic heart disease of native coronary artery without angina pectoris: Secondary | ICD-10-CM

## 2017-08-17 LAB — HEPATIC FUNCTION PANEL
ALT: 11 IU/L (ref 0–32)
AST: 14 IU/L (ref 0–40)
Albumin: 4.3 g/dL (ref 3.5–4.8)
Alkaline Phosphatase: 66 IU/L (ref 39–117)
Bilirubin Total: 0.8 mg/dL (ref 0.0–1.2)
Bilirubin, Direct: 0.2 mg/dL (ref 0.00–0.40)
Total Protein: 6.5 g/dL (ref 6.0–8.5)

## 2017-08-17 LAB — LIPID PANEL
Chol/HDL Ratio: 2.7 ratio (ref 0.0–4.4)
Cholesterol, Total: 147 mg/dL (ref 100–199)
HDL: 55 mg/dL (ref >39)
LDL Calculated: 76 mg/dL (ref 0–99)
Triglycerides: 79 mg/dL (ref 0–149)
VLDL Cholesterol Cal: 16 mg/dL (ref 5–40)

## 2017-08-21 ENCOUNTER — Telehealth: Payer: Self-pay | Admitting: Cardiovascular Disease

## 2017-08-21 NOTE — Telephone Encounter (Signed)
Patient returning call for lab results.

## 2017-08-21 NOTE — Telephone Encounter (Signed)
Patient was calling back regarding message to call back regarding lab results. Patient made aware of lab results. Patient verbalized understanding and thankful for the call

## 2017-08-30 DIAGNOSIS — H2513 Age-related nuclear cataract, bilateral: Secondary | ICD-10-CM | POA: Diagnosis not present

## 2017-08-30 DIAGNOSIS — H5203 Hypermetropia, bilateral: Secondary | ICD-10-CM | POA: Diagnosis not present

## 2017-08-30 DIAGNOSIS — E119 Type 2 diabetes mellitus without complications: Secondary | ICD-10-CM | POA: Diagnosis not present

## 2017-08-30 DIAGNOSIS — Z7984 Long term (current) use of oral hypoglycemic drugs: Secondary | ICD-10-CM | POA: Diagnosis not present

## 2017-08-30 DIAGNOSIS — H52223 Regular astigmatism, bilateral: Secondary | ICD-10-CM | POA: Diagnosis not present

## 2017-10-10 DIAGNOSIS — S161XXA Strain of muscle, fascia and tendon at neck level, initial encounter: Secondary | ICD-10-CM | POA: Diagnosis not present

## 2017-10-10 DIAGNOSIS — M542 Cervicalgia: Secondary | ICD-10-CM | POA: Diagnosis not present

## 2017-10-14 ENCOUNTER — Other Ambulatory Visit: Payer: Self-pay | Admitting: Nurse Practitioner

## 2017-10-16 DIAGNOSIS — Z803 Family history of malignant neoplasm of breast: Secondary | ICD-10-CM | POA: Diagnosis not present

## 2017-10-16 DIAGNOSIS — Z1231 Encounter for screening mammogram for malignant neoplasm of breast: Secondary | ICD-10-CM | POA: Diagnosis not present

## 2017-11-03 ENCOUNTER — Other Ambulatory Visit: Payer: Self-pay | Admitting: Cardiovascular Disease

## 2017-11-28 DIAGNOSIS — F3342 Major depressive disorder, recurrent, in full remission: Secondary | ICD-10-CM | POA: Diagnosis not present

## 2017-11-28 DIAGNOSIS — I2510 Atherosclerotic heart disease of native coronary artery without angina pectoris: Secondary | ICD-10-CM | POA: Diagnosis not present

## 2017-11-28 DIAGNOSIS — Z7984 Long term (current) use of oral hypoglycemic drugs: Secondary | ICD-10-CM | POA: Diagnosis not present

## 2017-11-28 DIAGNOSIS — I10 Essential (primary) hypertension: Secondary | ICD-10-CM | POA: Diagnosis not present

## 2017-11-28 DIAGNOSIS — E785 Hyperlipidemia, unspecified: Secondary | ICD-10-CM | POA: Diagnosis not present

## 2017-11-28 DIAGNOSIS — E1169 Type 2 diabetes mellitus with other specified complication: Secondary | ICD-10-CM | POA: Diagnosis not present

## 2017-12-12 IMAGING — CR DG CHEST 2V
2 series · 2 of 2 positions shown · non-contrast
Comparison: 06/08/2014

CLINICAL DATA: Cough

EXAM:
CHEST - 2 VIEW

[lateral]
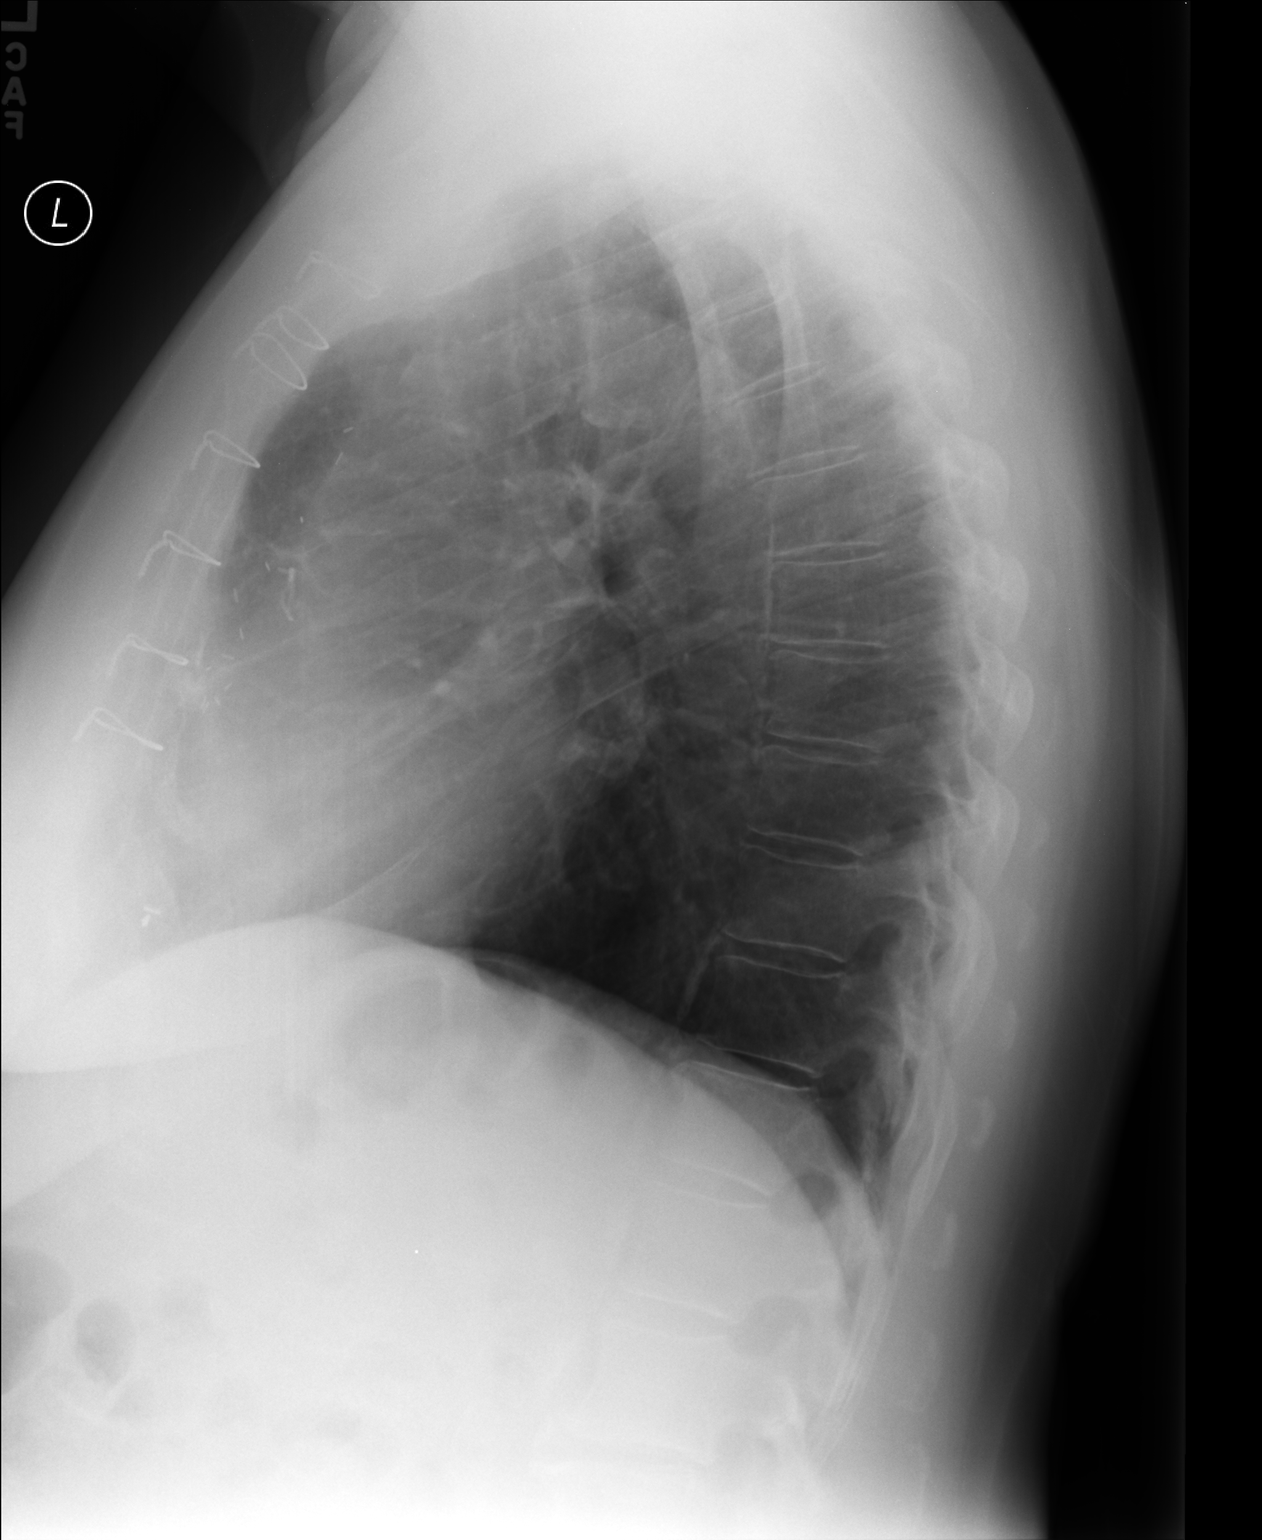

[PA]
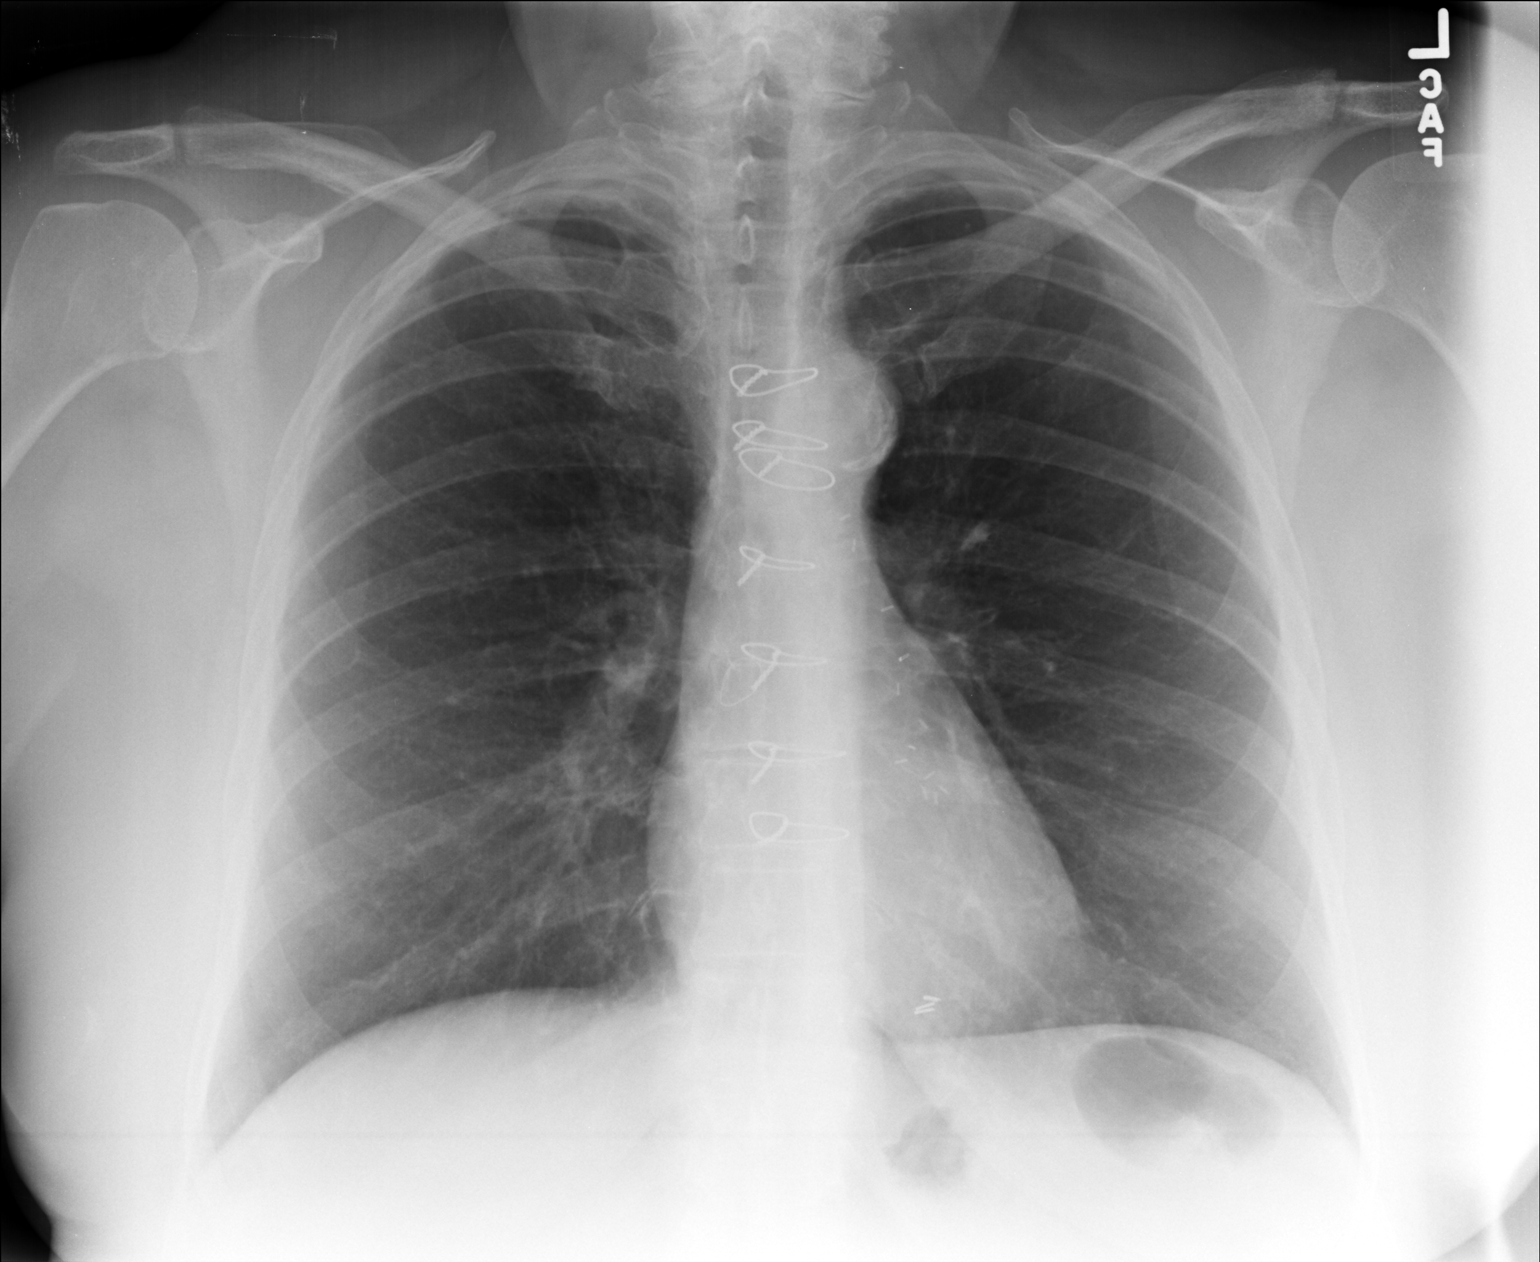

[2 of 2 positions shown; findings below may reference images not displayed]

FINDINGS: Postsurgical changes are again seen. Cardiac shadow is stable. The
lungs are clear bilaterally. No bony abnormality is seen.
IMPRESSION: No acute abnormality noted.

## 2017-12-30 ENCOUNTER — Ambulatory Visit (HOSPITAL_COMMUNITY)
Admission: EM | Admit: 2017-12-30 | Discharge: 2017-12-30 | Disposition: A | Discharge status: Home / Self Care | Payer: PPO | Attending: Family Medicine | Admitting: Family Medicine

## 2017-12-30 ENCOUNTER — Encounter (HOSPITAL_COMMUNITY): Payer: Self-pay | Admitting: *Deleted

## 2017-12-30 ENCOUNTER — Other Ambulatory Visit: Payer: Self-pay

## 2017-12-30 DIAGNOSIS — J40 Bronchitis, not specified as acute or chronic: Secondary | ICD-10-CM

## 2017-12-30 DIAGNOSIS — R062 Wheezing: Secondary | ICD-10-CM | POA: Diagnosis not present

## 2017-12-30 MED ORDER — AZITHROMYCIN 250 MG PO TABS: 250.0000 mg | ORAL_TABLET | Freq: Every day | ORAL | 0 refills | Status: DC

## 2017-12-30 MED ORDER — METHYLPREDNISOLONE SODIUM SUCC 125 MG IJ SOLR
80.0000 mg | Freq: Once | INTRAMUSCULAR | Status: AC
  Administered 2017-12-30: 80 mg via INTRAMUSCULAR

## 2017-12-30 MED ORDER — METHYLPREDNISOLONE SODIUM SUCC 125 MG IJ SOLR
INTRAMUSCULAR | Status: AC
  Filled 2017-12-30: qty 2

## 2017-12-30 NOTE — ED Provider Notes (Signed)
  [facility]    CSN: [id number] Arrival date & time: 12/30/17  1134  Chief Complaint  Patient presents with  . Nasal Congestion  . Cough    Alekhya H Cammon here for URI complaints.  Duration: 2 days  Associated symptoms: rhinorrhea, sore throat, wheezing, shortness of breath and cough Denies: sinus pain, rhinorrhea, itchy watery eyes, ear pain, ear drainage, myalgia and fevers Treatment to date: Mucinex Sick contacts: No  ROS:  Const: Denies fevers HEENT: As noted in HPI Lungs: No current SOB  Past Medical History:  Diagnosis Date  . Adenomatous colon polyp 11/2005  . Anginal pain ([place])   . Arthritis   . CAD (coronary artery disease)    2V CABG 1996 (LIMA to LAD, SVG to RCA) Dr. Servando Snare.; last stress test in 2010  . Headache   . Hyperlipidemia   . Hypertension   . Muscle cramps    and pains  . Myocardial infarction ([place])   . Pre-diabetes   . Ulcerative colitis      BP (!) 142/69   Pulse 79   Temp 98.1 F (36.7 C) (Oral)   Resp 18   SpO2 99%  General: Awake, alert, appears stated age HEENT: AT, [place], ears patent b/l and TM's neg, nares patent w/o discharge, pharynx pink and without exudates, MMM Neck: No masses or asymmetry Heart: RRR Lungs: Faint wheezing heard at bases b/l, no accessory muscle use Psych: Age appropriate judgment and insight, normal mood and affect  Wheezy bronchitis  Steroid inj today. Zpak if no improvement within 24-48hrs. Continue to push fluids, practice good hand hygiene, cover mouth when coughing. F/u prn. If starting to experience fevers, shaking, or shortness of breath, seek immediate care. Pt voiced understanding and agreement to the plan.    Shelda Pal, [place] 01/01/18 [phone]

## 2017-12-30 NOTE — ED Triage Notes (Signed)
C/O congestion x few days with significant coughing.  C/o coughing up dark sputum.  No known fevers.

## 2017-12-30 NOTE — Discharge Instructions (Signed)
Continue to push fluids, practice good hand hygiene, and cover your mouth if you cough.  If you start having fevers, shaking or shortness of breath, seek immediate care.  Don't use the antibiotic until Monday and only if you are not feeling better.

## 2018-01-12 ENCOUNTER — Ambulatory Visit: Payer: PPO | Admitting: Cardiovascular Disease

## 2018-01-12 ENCOUNTER — Encounter: Payer: Self-pay | Admitting: Cardiovascular Disease

## 2018-01-12 VITALS — BP 118/60 | HR 79 | Ht 66.0 in | Wt 173.2 lb

## 2018-01-12 DIAGNOSIS — E7800 Pure hypercholesterolemia, unspecified: Secondary | ICD-10-CM | POA: Diagnosis not present

## 2018-01-12 DIAGNOSIS — I2510 Atherosclerotic heart disease of native coronary artery without angina pectoris: Secondary | ICD-10-CM

## 2018-01-12 DIAGNOSIS — I10 Essential (primary) hypertension: Secondary | ICD-10-CM | POA: Diagnosis not present

## 2018-01-12 MED ORDER — CLOPIDOGREL BISULFATE 75 MG PO TABS: ORAL_TABLET | ORAL | 3 refills | Status: DC

## 2018-01-12 MED ORDER — NITROGLYCERIN 0.4 MG SL SUBL: 0.4000 mg | SUBLINGUAL_TABLET | SUBLINGUAL | 6 refills | Status: DC | PRN

## 2018-01-12 NOTE — Patient Instructions (Signed)

## 2018-01-12 NOTE — Progress Notes (Signed)
Chief Complaint  Patient presents with  . Follow-up    CAD   History of Present Illness: 72 yo female with h/o CAD with acute anterior MI April 1996 with angioplasty, CABG June 1996 (LIMA to LAD, SVG to RCA), stenting Circumflex 2016, HLD, HTN, ulcerative colitis here today for cardiology followup. Last cardiac cath in May 2016 and she was found to have a severe stenosis in the mid Circumflex treated with a drug eluting stent. The LIMA was patent to the LAD and the SVG was patent to the PDA. Echo January 2019 with GUYQ=03-47%, grade 2 diastolic dysfunction.   She is here today for follow up. The patient denies any chest pain, dyspnea, palpitations, lower extremity edema, orthopnea, PND, dizziness, near syncope or syncope. Recent cough due to bronchitis.   Primary Care Physician: [name], MD  Past Medical History:  Diagnosis Date  . Adenomatous colon polyp 11/2005  . Anginal pain ([place])   . Arthritis   . CAD (coronary artery disease)    2V CABG 1996 (LIMA to LAD, SVG to RCA) Dr. Servando Snare.; last stress test in 2010  . Headache   . Hyperlipidemia   . Hypertension   . Muscle cramps    and pains  . Myocardial infarction ([place])   . Pre-diabetes   . Ulcerative colitis     Past Surgical History:  Procedure Laterality Date  . BACK SURGERY     lumbar fusion 25 years ago and ruptured disk 10 years ago same area  . CARDIAC CATHETERIZATION N/A 10/13/2014   Procedure: Left Heart Cath And Coronary Angiography;  Surgeon: Burnell Blanks, MD;  Location: [facility];  Service: Cardiovascular;  Laterality: N/A;  . CARDIAC CATHETERIZATION N/A 10/13/2014   Procedure: Coronary Stent Intervention;  Surgeon: Burnell Blanks, MD;  Location: [facility];  Service: Cardiovascular;  Laterality: N/A;  . COLONOSCOPY    . CORONARY ANGIOPLASTY WITH STENT PLACEMENT  10/13/2014   DES to [place]  . CORONARY ARTERY BYPASS GRAFT  1996   x 2  . POLYPECTOMY      . UPPER GASTROINTESTINAL ENDOSCOPY      Current Outpatient Medications  Medication Sig Dispense Refill  . balsalazide (COLAZAL) 750 MG capsule TAKE ONE CAPSULE BY MOUTH 3 TIMES A DAY *NEEDS APPT FOR REFILLS 270 capsule 2  . carvedilol (COREG) 3.125 MG tablet TAKE 1 TABLET (3.125 MG TOTAL) BY MOUTH 2 (TWO) TIMES DAILY. 180 tablet 1  . cholecalciferol (VITAMIN D) 1000 UNITS tablet Take 1,000 Units by mouth daily.      . clopidogrel (PLAVIX) 75 MG tablet TAKE 1 TABLET (75 MG TOTAL) BY MOUTH DAILY WITH BREAKFAST. 90 tablet 3  . FLUoxetine (PROZAC) 10 MG capsule Take 10 mg by mouth daily.     Marland Kitchen LORazepam (ATIVAN) 0.5 MG tablet Take 0.5 mg by mouth as needed for anxiety (for anxiety). Reported on 12/04/2015    . losartan-hydrochlorothiazide (HYZAAR) 100-25 MG tablet TAKE 1 TABLET BY MOUTH EVERY DAY 90 tablet 1  . metFORMIN (GLUCOPHAGE-XR) 500 MG 24 hr tablet TAKE 1 TABLET BY MOUTH EVERY DAY IN THE EVENING WITH FOOD  5  . Multiple Vitamins-Minerals (MULTIVITAMIN WITH MINERALS) tablet Take 1 tablet by mouth daily.      . pantoprazole (PROTONIX) 40 MG tablet TAKE 1 TABLET EVERY DAY 30 tablet 8  . simvastatin (ZOCOR) 40 MG tablet Take 40 mg by mouth every evening.   1  . nitroGLYCERIN (NITROSTAT)  0.4 MG SL tablet Place 1 tablet (0.4 mg total) under the tongue every 5 (five) minutes as needed for chest pain. 25 tablet 6   No current facility-administered medications for this visit.     No Known Allergies  Social History   Socioeconomic History  . Marital status: Married    Spouse name: Not on file  . Number of children: Not on file  . Years of education: Not on file  . Highest education level: Not on file  Occupational History  . Not on file  Social Needs  . Financial resource strain: Not on file  . Food insecurity:    Worry: Not on file    Inability: Not on file  . Transportation needs:    Medical: Not on file    Non-medical: Not on file  Tobacco Use  . Smoking status: Former Smoker     Last attempt to quit: 10/04/1994    Years since quitting: 23.2  . Smokeless tobacco: Never Used  Substance and Sexual Activity  . Alcohol use: No  . Drug use: No  . Sexual activity: Not Currently    Partners: Male  Lifestyle  . Physical activity:    Days per week: Not on file    Minutes per session: Not on file  . Stress: Not on file  Relationships  . Social connections:    Talks on phone: Not on file    Gets together: Not on file    Attends religious service: Not on file    Active member of club or organization: Not on file    Attends meetings of clubs or organizations: Not on file    Relationship status: Not on file  . Intimate partner violence:    Fear of current or ex partner: Not on file    Emotionally abused: Not on file    Physically abused: Not on file    Forced sexual activity: Not on file  Other Topics Concern  . Not on file  Social History Narrative  . Not on file    Family History  Problem Relation Age of Onset  . Diabetes Mother   . Heart disease Mother   . Heart attack Mother 85  . Hypertension Mother   . Lung cancer Brother   . Breast cancer Sister   . Kidney disease Brother        renal cancer  . Heart disease Sister   . Colon cancer Daughter   . Hypertension Sister   . Stroke Sister   . Pancreatitis Other   . Stomach cancer Neg Hx     Review of Systems:  As stated in the HPI and otherwise negative.   BP 118/60   Pulse 79   Ht 5' 6"  (1.676 m)   Wt 173 lb 3.2 oz (78.6 kg)   SpO2 95%   BMI 27.96 kg/m   Physical Examination:  General: Well developed, well nourished, NAD  HEENT: OP clear, mucus membranes moist  SKIN: warm, dry. No rashes. Neuro: No focal deficits  Musculoskeletal: Muscle strength 5/5 all ext  Psychiatric: Mood and affect normal  Neck: No JVD, no carotid bruits, no thyromegaly, no lymphadenopathy.  Lungs:Clear bilaterally, no wheezes, rhonci, crackles Cardiovascular: Regular rate and rhythm. No murmurs, gallops or  rubs. Abdomen:Soft. Bowel sounds present. Non-tender.  Extremities: No lower extremity edema. Pulses are 2 + in the bilateral DP/PT.  Echo January 2019: - Left ventricle: The cavity size was normal. Wall thickness was   normal. Systolic  function was normal. The estimated ejection   fraction was in the range of 60% to 65%. Wall motion was normal;   there were no regional wall motion abnormalities. Features are   consistent with a pseudonormal left ventricular filling pattern,   with concomitant abnormal relaxation and increased filling   pressure (grade 2 diastolic dysfunction).  Cardiac cath Oct 13, 2014: Left main: Diffuse 20% stenosis.  Left Anterior Descending Artery: Large caliber vessel that courses to the apex. The proximal vessel is occluded. The mid and distal vessel fills from the patent graft.  Circumflex Artery: Moderate caliber vessel with moderate caliber obtuse marginal branch. The mid vessel has a 99% stenosis.  Right Coronary Artery: Large dominant vessel with 100% mid occlusion. The distal vessel fills from the patent vein graft.  Graft Anatomy:  SVG to PDA is patent LIMA to mid LAD is patent Left Ventricular Angiogram: LVEF=60-65%.   EKG:  EKG is ordered today. The ekg ordered today demonstrates NSR, poor R wave progression. unchanged  Recent Labs: 08/17/2017: ALT 11   Lipid Panel    Component Value Date/Time   CHOL 147 08/17/2017 0859   TRIG 79 08/17/2017 0859   HDL 55 08/17/2017 0859   CHOLHDL 2.7 08/17/2017 0859   CHOLHDL 3.0 03/24/2015 0938   VLDL 12 03/24/2015 0938   LDLCALC 76 08/17/2017 0859     Wt Readings from Last 3 Encounters:  01/12/18 173 lb 3.2 oz (78.6 kg)  07/21/17 171 lb 12.8 oz (77.9 kg)  05/25/17 172 lb (78 kg)     Other studies Reviewed: Additional studies/ records that were reviewed today include:  Review of the above records demonstrates:    Assessment and Plan:   1. CAD without angina: She is s/p 2V CABG in 1996. Last  cardiac cath May 2016 at which time a DES was placed in the mid Circumflex. LIMA graft was patent to the LAD and the SVG was patent to the PDA. Echo January 2019 with normal LV function. She has no chest pain. Will continue Plavix, beta blocker and statin.   2. HTN: BP is well controlled. No changes  3. Hyperlipidemia: LDL near goal. Continue statin.   Current medicines are reviewed at length with the patient today.  The patient does not have concerns regarding medicines.  The following changes have been made:  no change  Labs/ tests ordered today include:  Orders Placed This Encounter  Procedures  . EKG 12-Lead    Disposition:   FU with me in 12 months.   Signed, Lauree Chandler, MD 01/12/2018 3:06 PM    [facility] [place], [place], [place]  [address] Phone: [phone]; Fax: [phone]

## 2018-01-25 ENCOUNTER — Other Ambulatory Visit: Payer: Self-pay | Admitting: Cardiovascular Disease

## 2018-03-07 DIAGNOSIS — N390 Urinary tract infection, site not specified: Secondary | ICD-10-CM | POA: Diagnosis not present

## 2018-03-13 DIAGNOSIS — R399 Unspecified symptoms and signs involving the genitourinary system: Secondary | ICD-10-CM | POA: Diagnosis not present

## 2018-03-13 DIAGNOSIS — Z23 Encounter for immunization: Secondary | ICD-10-CM | POA: Diagnosis not present

## 2018-03-13 DIAGNOSIS — N3001 Acute cystitis with hematuria: Secondary | ICD-10-CM | POA: Diagnosis not present

## 2018-03-19 ENCOUNTER — Ambulatory Visit: Payer: PPO | Admitting: Gastroenterology

## 2018-03-19 ENCOUNTER — Encounter: Payer: Self-pay | Admitting: Gastroenterology

## 2018-03-19 VITALS — BP 126/70 | HR 74 | Ht 66.0 in | Wt 170.2 lb

## 2018-03-19 DIAGNOSIS — Z86010 Personal history of colonic polyps: Secondary | ICD-10-CM

## 2018-03-19 DIAGNOSIS — Z01419 Encounter for gynecological examination (general) (routine) without abnormal findings: Secondary | ICD-10-CM | POA: Diagnosis not present

## 2018-03-19 DIAGNOSIS — K5130 Ulcerative (chronic) rectosigmoiditis without complications: Secondary | ICD-10-CM | POA: Diagnosis not present

## 2018-03-19 DIAGNOSIS — K219 Gastro-esophageal reflux disease without esophagitis: Secondary | ICD-10-CM

## 2018-03-19 DIAGNOSIS — Z124 Encounter for screening for malignant neoplasm of cervix: Secondary | ICD-10-CM | POA: Diagnosis not present

## 2018-03-19 DIAGNOSIS — Z6827 Body mass index (BMI) 27.0-27.9, adult: Secondary | ICD-10-CM | POA: Diagnosis not present

## 2018-03-19 MED ORDER — BALSALAZIDE DISODIUM 750 MG PO CAPS: 1500.0000 mg | ORAL_CAPSULE | Freq: Two times a day (BID) | ORAL | 11 refills | Status: DC

## 2018-03-19 NOTE — Progress Notes (Signed)
    History of Present Illness: This is a 72 year old female with left-sided UC and GERD.  She relates very rare episodes of mild burning lower abdominal discomfort that lasts for a few minutes.  They do not appear to be related to bowel movements or meals.  Symptoms seem relatively random to her and very mild.  No other gastrointestinal complaints.  She is currently being treated for UTI under the care of her PCP Dr. [name].  Patient states she has blood work performed annually in January by Dr. Tamala Julian.  Current Medications, Allergies, Past Medical History, Past Surgical History, Family History and Social History were reviewed in [employer] record.  Physical Exam: General: Well developed, well nourished, no acute distress Head: Normocephalic and atraumatic Eyes:  sclerae anicteric, EOMI Ears: Normal auditory acuity Mouth: No deformity or lesions Lungs: Clear throughout to auscultation Heart: Regular rate and rhythm; no murmurs, rubs or bruits Abdomen: Soft, non tender and non distended. No masses, hepatosplenomegaly or hernias noted. Normal Bowel sounds Rectal: Not done  Musculoskeletal: Symmetrical with no gross deformities  Pulses:  Normal pulses noted Extremities: No clubbing, cyanosis, edema or deformities noted Neurological: Alert oriented x 4, grossly nonfocal Psychological:  Alert and cooperative. Normal mood and affect  Assessment and Recommendations:  1.  Left-sided UC.  Continue balsalazide 1.5g p.o. twice daily.  Request a copies of her blood work from January 2019 and when performed from January 2020.  Surveillance colonoscopy due in June 2020.  2.  GERD. Pantoprazole 40 mg po qam. follow standard antireflux measures.  3.  Personal history of adenomatous colon polyps.  Surveillance colonoscopy due in June 2020.

## 2018-03-19 NOTE — Patient Instructions (Signed)
We have sent the following medications to your pharmacy for you to pick up at your convenience: basalazide.   Normal BMI (Body Mass Index- based on height and weight) is between 23 and 30. Your BMI today is Body mass index is 27.48 kg/m. Marland Kitchen Please consider follow up  regarding your BMI with your Primary Care Provider.  Thank you for choosing me and [facility].  Pricilla Riffle. Dagoberto Ligas., MD., Marval Regal

## 2018-03-22 DIAGNOSIS — M25511 Pain in right shoulder: Secondary | ICD-10-CM | POA: Diagnosis not present

## 2018-03-22 DIAGNOSIS — M7541 Impingement syndrome of right shoulder: Secondary | ICD-10-CM | POA: Diagnosis not present

## 2018-04-19 ENCOUNTER — Other Ambulatory Visit: Payer: Self-pay | Admitting: Orthopedic Surgery

## 2018-04-19 DIAGNOSIS — M25511 Pain in right shoulder: Secondary | ICD-10-CM

## 2018-04-27 ENCOUNTER — Ambulatory Visit
Admission: RE | Admit: 2018-04-27 | Discharge: 2018-04-27 | Disposition: A | Discharge status: Home / Self Care | Payer: PPO | Source: Ambulatory Visit | Attending: Orthopedic Surgery | Admitting: Orthopedic Surgery

## 2018-04-27 DIAGNOSIS — M25511 Pain in right shoulder: Secondary | ICD-10-CM

## 2018-05-03 ENCOUNTER — Other Ambulatory Visit: Payer: Self-pay | Admitting: Cardiovascular Disease

## 2018-05-09 DIAGNOSIS — M7541 Impingement syndrome of right shoulder: Secondary | ICD-10-CM | POA: Diagnosis not present

## 2018-05-11 ENCOUNTER — Other Ambulatory Visit: Payer: Self-pay | Admitting: Cardiovascular Disease

## 2018-07-10 DIAGNOSIS — F419 Anxiety disorder, unspecified: Secondary | ICD-10-CM | POA: Diagnosis not present

## 2018-07-10 DIAGNOSIS — E785 Hyperlipidemia, unspecified: Secondary | ICD-10-CM | POA: Diagnosis not present

## 2018-07-10 DIAGNOSIS — Z7984 Long term (current) use of oral hypoglycemic drugs: Secondary | ICD-10-CM | POA: Diagnosis not present

## 2018-07-10 DIAGNOSIS — E1169 Type 2 diabetes mellitus with other specified complication: Secondary | ICD-10-CM | POA: Diagnosis not present

## 2018-07-10 DIAGNOSIS — I10 Essential (primary) hypertension: Secondary | ICD-10-CM | POA: Diagnosis not present

## 2018-07-10 DIAGNOSIS — Z1389 Encounter for screening for other disorder: Secondary | ICD-10-CM | POA: Diagnosis not present

## 2018-07-10 DIAGNOSIS — I2510 Atherosclerotic heart disease of native coronary artery without angina pectoris: Secondary | ICD-10-CM | POA: Diagnosis not present

## 2018-07-10 DIAGNOSIS — F3342 Major depressive disorder, recurrent, in full remission: Secondary | ICD-10-CM | POA: Diagnosis not present

## 2018-07-10 DIAGNOSIS — Z0000 Encounter for general adult medical examination without abnormal findings: Secondary | ICD-10-CM | POA: Diagnosis not present

## 2018-11-12 ENCOUNTER — Telehealth: Payer: Self-pay | Admitting: Cardiovascular Disease

## 2018-11-12 NOTE — Telephone Encounter (Signed)
New Message   Pt is calling to make an appt for her self and her husband for August or September.  She says everytime she calls she can never get on the schedule and would like for the nurse to call her back    Please call

## 2018-11-12 NOTE — Telephone Encounter (Signed)
I spoke with pt and scheduled appointments for her and her husband on September 10th

## 2018-11-13 ENCOUNTER — Other Ambulatory Visit: Payer: Self-pay

## 2018-11-13 ENCOUNTER — Encounter: Payer: Self-pay | Admitting: Gastroenterology

## 2018-11-13 ENCOUNTER — Telehealth: Payer: Self-pay

## 2018-11-13 ENCOUNTER — Ambulatory Visit (INDEPENDENT_AMBULATORY_CARE_PROVIDER_SITE_OTHER): Payer: PPO | Admitting: Gastroenterology

## 2018-11-13 VITALS — Ht 66.0 in | Wt 170.0 lb

## 2018-11-13 DIAGNOSIS — Z86010 Personal history of colonic polyps: Secondary | ICD-10-CM

## 2018-11-13 DIAGNOSIS — K5150 Left sided colitis without complications: Secondary | ICD-10-CM

## 2018-11-13 DIAGNOSIS — Z7902 Long term (current) use of antithrombotics/antiplatelets: Secondary | ICD-10-CM | POA: Diagnosis not present

## 2018-11-13 MED ORDER — PEG-KCL-NACL-NASULF-NA ASC-C 140 G PO SOLR: 1.0000 | Freq: Once | ORAL | 0 refills | Status: AC

## 2018-11-13 MED ORDER — BALSALAZIDE DISODIUM 750 MG PO CAPS: 1500.0000 mg | ORAL_CAPSULE | Freq: Two times a day (BID) | ORAL | 11 refills | Status: DC

## 2018-11-13 NOTE — Telephone Encounter (Signed)
[facility] Pre-operative Risk Assessment     Request for surgical clearance:     Endoscopy Procedure  What type of surgery is being performed?     Colonoscopy  When is this surgery scheduled?     11/20/18  What type of clearance is required ?   Pharmacy  Are there any medications that need to be held prior to surgery and how long? Plavix x 5 days  Practice name and name of physician performing surgery?      [facility]  What is your office phone and fax number?      Phone- [phone]  [phone]  Anesthesia type (None, local, MAC, general) ?       MAC

## 2018-11-13 NOTE — Telephone Encounter (Signed)
   Primary Cardiologist: Lauree Chandler, MD  Clearance request received for colonscopy 6/9. Will route to Dr. Angelena Form for input on if OK to hold Plavix as requested then pt will need call.  Dr. Angelena Form - - Please route response to P CV DIV PREOP (the pre-op pool). Thank you.   Charlie Pitter, PA-C 11/13/2018, 4:24 PM

## 2018-11-13 NOTE — Patient Instructions (Signed)
We have sent the following medications to your pharmacy for you to pick up at your convenience: [place] have been scheduled for a colonoscopy. Please follow written instructions given to you at your visit today.  Please pick up your prep supplies at the pharmacy within the next 1-3 days. If you use inhalers (even only as needed), please bring them with you on the day of your procedure. Your physician has requested that you go to www.startemmi.com and enter the access code given to you at your visit today. This web site gives a general overview about your procedure. However, you should still follow specific instructions given to you by our office regarding your preparation for the procedure.  Thank you for choosing me and [facility].  Pricilla Riffle. Dagoberto Ligas., MD., Marval Regal

## 2018-11-13 NOTE — Progress Notes (Signed)
    History of Present Illness: This is a 73 year old female with left-sided ulcerative colitis and GERD.  Her GI symptoms are well controlled.  She is due for surveillance colonoscopy.  Colonoscopy in June 2017 showed 3 small adenomatous colon polyps and was otherwise unremarkable.  Biopsies for ulcerative colitis showed normal mucosa.  Current Medications, Allergies, Past Medical History, Past Surgical History, Family History and Social History were reviewed in [employer] record.   Physical Exam: Telemedicine - not performed   Assessment and Recommendations:  1.  Left-sided ulcerative colitis and a personal history of adenomatous colon polyps.  Continue balsalazide 1.5 g twice daily. Schedule colonoscopy. The risks (including bleeding, perforation, infection, missed lesions, medication reactions and possible hospitalization or surgery if complications occur), benefits, and alternatives to colonoscopy with possible biopsy and possible polypectomy were discussed with the patient and they consent to proceed.   2. CAD with DES. Hold Plavix 5 days before procedure - will instruct when and how to resume after procedure. Low but real risk of cardiovascular event such as heart attack, stroke, embolism, thrombosis or ischemia/infarct of other organs off Plavix explained and need to seek urgent help if this occurs. The patient consents to proceed. Will communicate by phone or EMR with patient's prescribing provider to confirm that holding Plavix is reasonable in this case. Can take EC ASA 81 mg daily while off Plavix.    These services were provided via telemedicine, audio only per patient request.  The patient was at home and the provider was in the office, alone.  We discussed the limitations of evaluation and management by telemedicine and the availability of in person appointments.  Patient consented for this telemedicine visit and is aware of possible charges for this service.   Office CMA or LPN participated in this telemedicine service.  Time spent on call: 10 minutes

## 2018-11-14 ENCOUNTER — Telehealth: Payer: Self-pay | Admitting: Gastroenterology

## 2018-11-14 NOTE — Telephone Encounter (Signed)
Patient called would like to know if their is any coupons for the suprep

## 2018-11-14 NOTE — Telephone Encounter (Signed)
   Primary Cardiologist: Lauree Chandler, MD  Chart reviewed as part of pre-operative protocol coverage. Patient was contacted 11/14/2018 in reference to pre-operative risk assessment for pending surgery as outlined below.  [name] [name] was last seen on 01/12/2018 by Dr. Angelena Form.  Since that day, [name] [name] has done well with no new cardiac complaints.  She has CAD which has been stable on medical therapy.  Therefore, based on ACC/AHA guidelines, the patient would be at acceptable risk for the planned procedure without further cardiovascular testing.   Per Dr. Angelena Form, it is OK to hold Plavix per request.   I will route this recommendation to the requesting party via Epic fax function and remove from pre-op pool.  Please call with questions.  Daune Perch, NP 11/14/2018, 12:13 PM

## 2018-11-14 NOTE — Telephone Encounter (Signed)
Left message for patient to return my call.

## 2018-11-14 NOTE — Telephone Encounter (Signed)
Per Dr. Angelena Form patient can hold Plavix 5 days prior to procedure. Patient states she has already been notified by there office.

## 2018-11-14 NOTE — Telephone Encounter (Signed)
OK to hold Plavix.   Lauree Chandler

## 2018-11-14 NOTE — Telephone Encounter (Signed)
Informed patient we have a free sample of Plenvu that I can leave at the front desk for her to pick up. Patient verbalized understanding.

## 2018-11-19 ENCOUNTER — Telehealth: Payer: Self-pay | Admitting: *Deleted

## 2018-11-19 NOTE — Telephone Encounter (Signed)
Covid-19 screening questions  Have you traveled in the last 14 days? no If yes where?  Do you now or have you had a fever in the last 14 days? no  Do you have any respiratory symptoms of shortness of breath or cough now or in the last 14 days? no  Do you have any family members or close contacts with diagnosed or suspected Covid-19 in the past 14 days? no  Have you been tested for Covid-19 and found to be positive? noPt is aware that care partner will wait in the car during parking lot; if they feel like they will be too hot to wait in the car; they may wait in the lobby.  We want them to wear a mask (we do not have any that we can provide them), practice social distancing, and we will check their temperatures when they get here.  I did remind patient that their care partner needs to stay in the parking lot the entire time. Pt will wear mask into building       

## 2018-11-20 ENCOUNTER — Encounter: Payer: Self-pay | Admitting: Gastroenterology

## 2018-11-20 ENCOUNTER — Ambulatory Visit (AMBULATORY_SURGERY_CENTER): Payer: PPO | Admitting: Gastroenterology

## 2018-11-20 ENCOUNTER — Other Ambulatory Visit: Payer: Self-pay

## 2018-11-20 VITALS — BP 130/77 | HR 70 | Temp 98.4°F | Resp 18 | Ht 66.0 in | Wt 170.0 lb

## 2018-11-20 DIAGNOSIS — I252 Old myocardial infarction: Secondary | ICD-10-CM | POA: Diagnosis not present

## 2018-11-20 DIAGNOSIS — Z86010 Personal history of colonic polyps: Secondary | ICD-10-CM | POA: Diagnosis not present

## 2018-11-20 DIAGNOSIS — E119 Type 2 diabetes mellitus without complications: Secondary | ICD-10-CM | POA: Diagnosis not present

## 2018-11-20 DIAGNOSIS — I2510 Atherosclerotic heart disease of native coronary artery without angina pectoris: Secondary | ICD-10-CM | POA: Diagnosis not present

## 2018-11-20 DIAGNOSIS — K5150 Left sided colitis without complications: Secondary | ICD-10-CM | POA: Diagnosis not present

## 2018-11-20 DIAGNOSIS — D123 Benign neoplasm of transverse colon: Secondary | ICD-10-CM

## 2018-11-20 DIAGNOSIS — I10 Essential (primary) hypertension: Secondary | ICD-10-CM | POA: Diagnosis not present

## 2018-11-20 DIAGNOSIS — K5190 Ulcerative colitis, unspecified, without complications: Secondary | ICD-10-CM | POA: Diagnosis not present

## 2018-11-20 MED ORDER — SODIUM CHLORIDE 0.9 % IV SOLN: 500.0000 mL | Freq: Once | INTRAVENOUS | Status: DC

## 2018-11-20 NOTE — Patient Instructions (Signed)
You may resume your plavix tomorrow per Dr. Fuller Plan. You will need another colonoscopy in 3 years.  YOU HAD AN ENDOSCOPIC PROCEDURE TODAY AT [facility]:   Refer to the procedure report that was given to you for any specific questions about what was found during the examination.  If the procedure report does not answer your questions, please call your gastroenterologist to clarify.  If you requested that your care partner not be given the details of your procedure findings, then the procedure report has been included in a sealed envelope for you to review at your convenience later.  YOU SHOULD EXPECT: Some feelings of bloating in the abdomen. Passage of more gas than usual.  Walking can help get rid of the air that was put into your GI tract during the procedure and reduce the bloating. If you had a lower endoscopy (such as a colonoscopy or flexible sigmoidoscopy) you may notice spotting of blood in your stool or on the toilet paper. If you underwent a bowel prep for your procedure, you may not have a normal bowel movement for a few days.  Please Note:  You might notice some irritation and congestion in your nose or some drainage.  This is from the oxygen used during your procedure.  There is no need for concern and it should clear up in a day or so.  SYMPTOMS TO REPORT IMMEDIATELY:   Following lower endoscopy (colonoscopy or flexible sigmoidoscopy):  Excessive amounts of blood in the stool  Significant tenderness or worsening of abdominal pains  Swelling of the abdomen that is new, acute  Fever of 100F or higher   For urgent or emergent issues, a gastroenterologist can be reached at any hour by calling [phone].   DIET:  We do recommend a small meal at first, but then you may proceed to your regular diet.  Drink plenty of fluids but you should avoid alcoholic beverages for 24 hours.  ACTIVITY:  You should plan to take it easy for the rest of today and you should NOT  DRIVE or use heavy machinery until tomorrow (because of the sedation medicines used during the test).    FOLLOW UP: Our staff will call the number listed on your records 48-72 hours following your procedure to check on you and address any questions or concerns that you may have regarding the information given to you following your procedure. If we do not reach you, we will leave a message.  We will attempt to reach you two times.  During this call, we will ask if you have developed any symptoms of COVID 19. If you develop any symptoms (ie: fever, flu-like symptoms, shortness of breath, cough etc.) before then, please call [phone].  If you test positive for Covid 19 in the 2 weeks post procedure, please call and report this information to [place].    If any biopsies were taken you will be contacted by phone or by letter within the next 1-3 weeks.  Please call us at [phone] if you have not heard about the biopsies in 3 weeks.    SIGNATURES/CONFIDENTIALITY: You and/or your care partner have signed paperwork which will be entered into your electronic medical record.  These signatures attest to the fact that that the information above on your After Visit Summary has been reviewed and is understood.  Full responsibility of the confidentiality of this discharge information lies with you and/or your care-partner.

## 2018-11-20 NOTE — Op Note (Addendum)
[facility] Patient Name: [name] [name] Procedure Date: 11/20/2018 2:31 PM MRN: [id number] Endoscopist: Ladene Artist , MD Age: 73 Referring MD:  Date of Birth: April 04, 1946 Gender: Female Account #: [id number] Procedure:                Colonoscopy Indications:              Surveillance: Personal history of adenomatous                            polyps on last colonoscopy 3 years ago, High risk                            colon cancer surveillance: Ulcerative left sided                            colitis of 8 (or more) years duration Medicines:                Monitored Anesthesia Care Procedure:                Pre-Anesthesia Assessment:                           - Prior to the procedure, a History and Physical                            was performed, and patient medications and                            allergies were reviewed. The patient's tolerance of                            previous anesthesia was also reviewed. The risks                            and benefits of the procedure and the sedation                            options and risks were discussed with the patient.                            All questions were answered, and informed consent                            was obtained. Prior Anticoagulants: The patient has                            taken Plavix (clopidogrel), last dose was 5 days                            prior to procedure. ASA Grade Assessment: III - A                            patient with severe systemic disease. After  reviewing the risks and benefits, the patient was                            deemed in satisfactory condition to undergo the                            procedure.                           After obtaining informed consent, the colonoscope                            was passed under direct vision. Throughout the                            procedure, the patient's blood pressure, pulse, and                 oxygen saturations were monitored continuously. The                            Colonoscope was introduced through the anus and                            advanced to the the cecum, identified by                            appendiceal orifice and ileocecal valve. The                            ileocecal valve, appendiceal orifice, and rectum                            were photographed. The quality of the bowel                            preparation was good. The patient tolerated the                            procedure well. The colonoscopy was somewhat                            difficult due to significant looping and a tortuous                            colon. Successful completion of the procedure was                            aided by changing the patient's position, using                            manual pressure, withdrawing and reinserting the                            scope and straightening and shortening the scope to  obtain bowel loop reduction. Scope In: 2:41:45 PM Scope Out: 3:03:36 PM Scope Withdrawal Time: 0 hours 10 minutes 8 seconds  Total Procedure Duration: 0 hours 21 minutes 51 seconds  Findings:                 The perianal and digital rectal examinations were                            normal.                           Two sessile polyps were found in the transverse                            colon. The polyps were 7 to 8 mm in size. These                            polyps were removed with a cold snare. Resection                            and retrieval were complete.                           Internal hemorrhoids were found. The hemorrhoids                            were small and Grade I (internal hemorrhoids that                            do not prolapse).                           The exam was otherwise without abnormality on                            direct views. Random biopsies obtained throughout.                             Unable to safely retroflex due to a narrow vault. Complications:            No immediate complications. Estimated blood loss:                            None. Estimated Blood Loss:     Estimated blood loss: none. Impression:               - Two 7 to 8 mm polyps in the transverse colon,                            removed with a cold snare. Resected and retrieved.                           - The examination was otherwise normal on direct  views. Random biopsies obtained throughout.                           - Internal hemorrhoids. Recommendation:           - Repeat colonoscopy in 3 years for surveillance.                           - Patient has a contact number available for                            emergencies. The signs and symptoms of potential                            delayed complications were discussed with the                            patient. Return to normal activities tomorrow.                            Written discharge instructions were provided to the                            patient.                           - Resume previous diet.                           - Continue present medications.                           - Await pathology results.                           - Resume Plavix (clopidogrel) at prior dose                            tomorrow. Refer to managing physician for further                            adjustment of therapy. Ladene Artist, MD 11/20/2018 3:11:30 PM This report has been signed electronically.

## 2018-11-20 NOTE — Progress Notes (Signed)
Report to PACU, RN, vss, BBS= Clear.  

## 2018-11-20 NOTE — Progress Notes (Signed)
Called to room to assist during endoscopic procedure.  Patient ID and intended procedure confirmed with present staff. Received instructions for my participation in the procedure from the performing physician.  

## 2018-11-20 NOTE — Progress Notes (Signed)
Pt's states no medical or surgical changes since previsit or office visit. 

## 2018-11-22 ENCOUNTER — Telehealth: Payer: Self-pay | Admitting: *Deleted

## 2018-11-22 NOTE — Telephone Encounter (Signed)
  Follow up Call-  Call back number 11/20/2018  Post procedure Call Back phone  # [phone]  Permission to leave phone message Yes  Some recent data might be hidden     Patient questions:  Do you have a fever, pain , or abdominal swelling? Yes.   Pain Score  0 *  Have you tolerated food without any problems? Yes.    Have you been able to return to your normal activities? Yes.    Do you have any questions about your discharge instructions: Diet   No. Medications  No. Follow up visit  No.  Do you have questions or concerns about your Care? No.    Patient reports she has had sore throat since waking up from colonoscopy 2 days ago.  Patient denies  Fever  or other symptoms.  Recommended she take her allergy medicine, tylenol, and try warm salt water gargles.  If it doesn't improve by tomorrow recommended she call her primary care provider.  Patient agreed and said she would let us know if she is treated for anything.  Actions: * If pain score is 4 or above: No action needed, pain <4.  1. Have you developed a fever since your procedure? NO  2.   Have you had an respiratory symptoms (SOB or cough) since your procedure? NO  3.   Have you tested positive for COVID 19 since your procedure NO  4.   Have you had any family members/close contacts diagnosed with the COVID 19 since your procedure?  NO   If yes to any of these questions please route to Joylene John, RN and Alphonsa Gin, RN.

## 2018-11-25 DIAGNOSIS — R05 Cough: Secondary | ICD-10-CM | POA: Diagnosis not present

## 2018-11-26 ENCOUNTER — Encounter: Payer: Self-pay | Admitting: Gastroenterology

## 2018-12-12 DIAGNOSIS — Z803 Family history of malignant neoplasm of breast: Secondary | ICD-10-CM | POA: Diagnosis not present

## 2018-12-12 DIAGNOSIS — Z780 Asymptomatic menopausal state: Secondary | ICD-10-CM | POA: Diagnosis not present

## 2018-12-12 DIAGNOSIS — M8589 Other specified disorders of bone density and structure, multiple sites: Secondary | ICD-10-CM | POA: Diagnosis not present

## 2018-12-12 DIAGNOSIS — R29890 Loss of height: Secondary | ICD-10-CM | POA: Diagnosis not present

## 2018-12-12 DIAGNOSIS — K589 Irritable bowel syndrome without diarrhea: Secondary | ICD-10-CM | POA: Diagnosis not present

## 2018-12-12 DIAGNOSIS — Z1231 Encounter for screening mammogram for malignant neoplasm of breast: Secondary | ICD-10-CM | POA: Diagnosis not present

## 2018-12-18 DIAGNOSIS — R922 Inconclusive mammogram: Secondary | ICD-10-CM | POA: Diagnosis not present

## 2019-01-08 DIAGNOSIS — F3342 Major depressive disorder, recurrent, in full remission: Secondary | ICD-10-CM | POA: Diagnosis not present

## 2019-01-08 DIAGNOSIS — F419 Anxiety disorder, unspecified: Secondary | ICD-10-CM | POA: Diagnosis not present

## 2019-01-08 DIAGNOSIS — E1169 Type 2 diabetes mellitus with other specified complication: Secondary | ICD-10-CM | POA: Diagnosis not present

## 2019-01-08 DIAGNOSIS — Z7984 Long term (current) use of oral hypoglycemic drugs: Secondary | ICD-10-CM | POA: Diagnosis not present

## 2019-01-08 DIAGNOSIS — I10 Essential (primary) hypertension: Secondary | ICD-10-CM | POA: Diagnosis not present

## 2019-01-08 DIAGNOSIS — I2510 Atherosclerotic heart disease of native coronary artery without angina pectoris: Secondary | ICD-10-CM | POA: Diagnosis not present

## 2019-01-08 DIAGNOSIS — E785 Hyperlipidemia, unspecified: Secondary | ICD-10-CM | POA: Diagnosis not present

## 2019-01-21 ENCOUNTER — Other Ambulatory Visit: Payer: Self-pay | Admitting: Cardiovascular Disease

## 2019-01-22 DIAGNOSIS — M542 Cervicalgia: Secondary | ICD-10-CM | POA: Diagnosis not present

## 2019-01-22 DIAGNOSIS — M7541 Impingement syndrome of right shoulder: Secondary | ICD-10-CM | POA: Diagnosis not present

## 2019-01-27 ENCOUNTER — Other Ambulatory Visit: Payer: Self-pay | Admitting: Cardiovascular Disease

## 2019-01-28 ENCOUNTER — Other Ambulatory Visit: Payer: Self-pay | Admitting: Cardiovascular Disease

## 2019-02-09 ENCOUNTER — Other Ambulatory Visit: Payer: Self-pay | Admitting: Cardiovascular Disease

## 2019-02-21 ENCOUNTER — Other Ambulatory Visit: Payer: Self-pay

## 2019-02-21 ENCOUNTER — Encounter: Payer: Self-pay | Admitting: Cardiovascular Disease

## 2019-02-21 ENCOUNTER — Ambulatory Visit (INDEPENDENT_AMBULATORY_CARE_PROVIDER_SITE_OTHER): Payer: PPO | Admitting: Cardiovascular Disease

## 2019-02-21 VITALS — BP 124/68 | HR 78 | Ht 66.0 in | Wt 175.0 lb

## 2019-02-21 DIAGNOSIS — I2510 Atherosclerotic heart disease of native coronary artery without angina pectoris: Secondary | ICD-10-CM

## 2019-02-21 DIAGNOSIS — I10 Essential (primary) hypertension: Secondary | ICD-10-CM | POA: Diagnosis not present

## 2019-02-21 DIAGNOSIS — E7800 Pure hypercholesterolemia, unspecified: Secondary | ICD-10-CM

## 2019-02-21 NOTE — Progress Notes (Signed)
Chief Complaint  Patient presents with  . Follow-up    CAD   History of Present Illness: 73 yo female with h/o CAD with acute anterior MI April 1996 with angioplasty, CABG June 1996 (LIMA to LAD, SVG to RCA), stenting Circumflex 2016, HLD, HTN, ulcerative colitis here today for cardiology followup. Last cardiac cath in May 2016 and she was found to have a severe stenosis in the mid Circumflex treated with a drug eluting stent. The LIMA was patent to the LAD and the SVG was patent to the PDA. Echo January 2019 with XIPJ=82-50%, grade 2 diastolic dysfunction.   She is here today for follow up. The patient denies any chest pain, dyspnea, palpitations, lower extremity edema, orthopnea, PND, dizziness, near syncope or syncope.    Primary Care Physician: [name], MD  Past Medical History:  Diagnosis Date  . Adenomatous colon polyp 11/2005  . Anginal pain ([place])   . Arthritis   . CAD (coronary artery disease)    2V CABG 1996 (LIMA to LAD, SVG to RCA) Dr. Servando Snare.; last stress test in 2010  . Headache   . Hyperlipidemia   . Hypertension   . Muscle cramps    and pains  . Myocardial infarction ([place])   . Pre-diabetes   . Ulcerative colitis     Past Surgical History:  Procedure Laterality Date  . BACK SURGERY     lumbar fusion 25 years ago and ruptured disk 10 years ago same area  . CARDIAC CATHETERIZATION N/A 10/13/2014   Procedure: Left Heart Cath And Coronary Angiography;  Surgeon: Burnell Blanks, MD;  Location: [facility];  Service: Cardiovascular;  Laterality: N/A;  . CARDIAC CATHETERIZATION N/A 10/13/2014   Procedure: Coronary Stent Intervention;  Surgeon: Burnell Blanks, MD;  Location: [facility];  Service: Cardiovascular;  Laterality: N/A;  . COLONOSCOPY    . CORONARY ANGIOPLASTY WITH STENT PLACEMENT  10/13/2014   DES to [place]  . CORONARY ARTERY BYPASS GRAFT  1996   x 2  . POLYPECTOMY    . UPPER GASTROINTESTINAL  ENDOSCOPY      Current Outpatient Medications  Medication Sig Dispense Refill  . balsalazide (COLAZAL) 750 MG capsule Take 2 capsules (1,500 mg total) by mouth 2 (two) times daily. Take 2 capsules two times daily 120 capsule 11  . calcium carbonate (CALCIUM 600) 600 MG TABS tablet Take 600 mg by mouth daily with breakfast.    . carvedilol (COREG) 3.125 MG tablet TAKE 1 TABLET (3.125 MG TOTAL) BY MOUTH 2 (TWO) TIMES DAILY. 180 tablet 0  . cholecalciferol (VITAMIN D) 1000 UNITS tablet Take 1,000 Units by mouth daily.      . clopidogrel (PLAVIX) 75 MG tablet TAKE 1 TABLET (75 MG TOTAL) BY MOUTH DAILY WITH BREAKFAST. 90 tablet 0  . FLUoxetine (PROZAC) 10 MG capsule Take 10 mg by mouth daily.     . hydrochlorothiazide (HYDRODIURIL) 25 MG tablet TAKE 1 TAB BY MOUTH DAILY 90 tablet 0  . LORazepam (ATIVAN) 0.5 MG tablet Take 0.5 mg by mouth as needed for anxiety (for anxiety). Reported on 12/04/2015    . losartan (COZAAR) 100 MG tablet Take 1 tablet by mouth daily.    . metFORMIN (GLUCOPHAGE-XR) 500 MG 24 hr tablet TAKE 1 TABLET BY MOUTH EVERY DAY IN THE EVENING WITH FOOD  5  . Multiple Vitamins-Minerals (MULTIVITAMIN WITH MINERALS) tablet Take 1 tablet by mouth daily.      Marland Kitchen  nitroGLYCERIN (NITROSTAT) 0.4 MG SL tablet Place 1 tablet (0.4 mg total) under the tongue every 5 (five) minutes as needed for chest pain. 25 tablet 6  . pantoprazole (PROTONIX) 40 MG tablet TAKE 1 TABLET BY MOUTH EVERY DAY 90 tablet 3  . simvastatin (ZOCOR) 40 MG tablet Take 40 mg by mouth every evening.   1  . vitamin C (ASCORBIC ACID) 500 MG tablet Take 500 mg by mouth daily.     No current facility-administered medications for this visit.     No Known Allergies  Social History   Socioeconomic History  . Marital status: Married    Spouse name: Not on file  . Number of children: Not on file  . Years of education: Not on file  . Highest education level: Not on file  Occupational History  . Not on file  Social Needs   . Financial resource strain: Not on file  . Food insecurity    Worry: Not on file    Inability: Not on file  . Transportation needs    Medical: Not on file    Non-medical: Not on file  Tobacco Use  . Smoking status: Former Smoker    Quit date: 10/04/1994    Years since quitting: 24.4  . Smokeless tobacco: Never Used  Substance and Sexual Activity  . Alcohol use: No  . Drug use: No  . Sexual activity: Not Currently    Partners: Male  Lifestyle  . Physical activity    Days per week: Not on file    Minutes per session: Not on file  . Stress: Not on file  Relationships  . Social Herbalist on phone: Not on file    Gets together: Not on file    Attends religious service: Not on file    Active member of club or organization: Not on file    Attends meetings of clubs or organizations: Not on file    Relationship status: Not on file  . Intimate partner violence    Fear of current or ex partner: Not on file    Emotionally abused: Not on file    Physically abused: Not on file    Forced sexual activity: Not on file  Other Topics Concern  . Not on file  Social History Narrative  . Not on file    Family History  Problem Relation Age of Onset  . Diabetes Mother   . Heart disease Mother   . Heart attack Mother 70  . Hypertension Mother   . Lung cancer Brother   . Breast cancer Sister   . Kidney disease Brother        renal cancer  . Heart disease Sister   . Colon cancer Daughter   . Hypertension Sister   . Stroke Sister   . Pancreatitis Other   . Stomach cancer Neg Hx     Review of Systems:  As stated in the HPI and otherwise negative.   BP 124/68   Pulse 78   Ht 5' 6"  (1.676 m)   Wt 175 lb (79.4 kg)   SpO2 94%   BMI 28.25 kg/m   Physical Examination:  General: Well developed, well nourished, NAD  HEENT: OP clear, mucus membranes moist  SKIN: warm, dry. No rashes. Neuro: No focal deficits  Musculoskeletal: Muscle strength 5/5 all ext  Psychiatric:  Mood and affect normal  Neck: No JVD, no carotid bruits, no thyromegaly, no lymphadenopathy.  Lungs:Clear bilaterally, no wheezes, rhonci,  crackles Cardiovascular: Regular rate and rhythm. No murmurs, gallops or rubs. Abdomen:Soft. Bowel sounds present. Non-tender.  Extremities: No lower extremity edema. Pulses are 2 + in the bilateral DP/PT.  Echo January 2019: - Left ventricle: The cavity size was normal. Wall thickness was   normal. Systolic function was normal. The estimated ejection   fraction was in the range of 60% to 65%. Wall motion was normal;   there were no regional wall motion abnormalities. Features are   consistent with a pseudonormal left ventricular filling pattern,   with concomitant abnormal relaxation and increased filling   pressure (grade 2 diastolic dysfunction).  Cardiac cath Oct 13, 2014: Left main: Diffuse 20% stenosis.  Left Anterior Descending Artery: Large caliber vessel that courses to the apex. The proximal vessel is occluded. The mid and distal vessel fills from the patent graft.  Circumflex Artery: Moderate caliber vessel with moderate caliber obtuse marginal branch. The mid vessel has a 99% stenosis.  Right Coronary Artery: Large dominant vessel with 100% mid occlusion. The distal vessel fills from the patent vein graft.  Graft Anatomy:  SVG to PDA is patent LIMA to mid LAD is patent Left Ventricular Angiogram: LVEF=60-65%.   EKG:  EKG is ordered today. The ekg ordered today demonstrates NSR, rate 78 bpm.   Recent Labs: No results found for requested labs within last 8760 hours.   Lipid Panel    Component Value Date/Time   CHOL 147 08/17/2017 0859   TRIG 79 08/17/2017 0859   HDL 55 08/17/2017 0859   CHOLHDL 2.7 08/17/2017 0859   CHOLHDL 3.0 03/24/2015 0938   VLDL 12 03/24/2015 0938   LDLCALC 76 08/17/2017 0859     Wt Readings from Last 3 Encounters:  02/21/19 175 lb (79.4 kg)  11/20/18 170 lb (77.1 kg)  11/13/18 170 lb (77.1 kg)      Other studies Reviewed: Additional studies/ records that were reviewed today include:  Review of the above records demonstrates:    Assessment and Plan:   1. CAD without angina: She is s/p 2V CABG in 1996. Last cardiac cath May 2016 at which time a DES was placed in the mid Circumflex. LIMA graft was patent to the LAD and the SVG was patent to the PDA. Echo January 2019 with normal LV function. No chest pain suggestive of angina. Continue Plavix, statin and beta blocker.    2. HTN: BP is controlled. No changes.   3. Hyperlipidemia: LDL near goal in 2019. Continue statin.Repeat lipids and LFTs now.    Current medicines are reviewed at length with the patient today.  The patient does not have concerns regarding medicines.  The following changes have been made:  no change  Labs/ tests ordered today include:  Orders Placed This Encounter  Procedures  . EKG 12-Lead    Disposition:   FU with me in 12 months.   Signed, Lauree Chandler, MD 02/21/2019 2:27 PM    [facility] [address], [place], [place]  [address] Phone: [phone]; Fax: [phone]

## 2019-03-11 DIAGNOSIS — E1169 Type 2 diabetes mellitus with other specified complication: Secondary | ICD-10-CM | POA: Diagnosis not present

## 2019-03-11 DIAGNOSIS — I10 Essential (primary) hypertension: Secondary | ICD-10-CM | POA: Diagnosis not present

## 2019-03-27 DIAGNOSIS — H5203 Hypermetropia, bilateral: Secondary | ICD-10-CM | POA: Diagnosis not present

## 2019-03-27 DIAGNOSIS — H524 Presbyopia: Secondary | ICD-10-CM | POA: Diagnosis not present

## 2019-03-27 DIAGNOSIS — H52223 Regular astigmatism, bilateral: Secondary | ICD-10-CM | POA: Diagnosis not present

## 2019-03-27 DIAGNOSIS — E119 Type 2 diabetes mellitus without complications: Secondary | ICD-10-CM | POA: Diagnosis not present

## 2019-04-11 ENCOUNTER — Other Ambulatory Visit: Payer: Self-pay | Admitting: Cardiovascular Disease

## 2019-04-16 ENCOUNTER — Other Ambulatory Visit: Payer: Self-pay

## 2019-04-16 DIAGNOSIS — Z20822 Contact with and (suspected) exposure to covid-19: Secondary | ICD-10-CM

## 2019-04-16 DIAGNOSIS — R05 Cough: Secondary | ICD-10-CM | POA: Diagnosis not present

## 2019-04-17 LAB — NOVEL CORONAVIRUS, NAA: SARS-CoV-2, NAA: NOT DETECTED

## 2019-04-23 ENCOUNTER — Other Ambulatory Visit: Payer: Self-pay | Admitting: Cardiovascular Disease

## 2019-06-04 ENCOUNTER — Other Ambulatory Visit: Payer: Self-pay | Admitting: Cardiovascular Disease

## 2019-07-07 ENCOUNTER — Ambulatory Visit: Payer: PPO | Attending: Internal Medicine

## 2019-07-07 DIAGNOSIS — Z23 Encounter for immunization: Secondary | ICD-10-CM

## 2019-07-07 NOTE — Progress Notes (Signed)
   Covid-19 Vaccination Clinic  Name:  ARACELYS GLADE    MRN: [id number] DOB: 05/02/1946  07/07/2019  Ms. Duff was observed post Covid-19 immunization for 15 minutes without incidence. She was provided with Vaccine Information Sheet and instruction to access the V-Safe system.   Ms. Babin was instructed to call 911 with any severe reactions post vaccine: Marland Kitchen Difficulty breathing  . Swelling of your face and throat  . A fast heartbeat  . A bad rash all over your body  . Dizziness and weakness    Immunizations Administered    Name Date Dose VIS Date Route   Pfizer COVID-19 Vaccine 07/07/2019  1:27 PM 0.3 mL 05/24/2019 Intramuscular   Manufacturer: [place]   Lot: [id number]   [place]: [id number]

## 2019-07-22 DIAGNOSIS — R0989 Other specified symptoms and signs involving the circulatory and respiratory systems: Secondary | ICD-10-CM | POA: Diagnosis not present

## 2019-07-22 DIAGNOSIS — R05 Cough: Secondary | ICD-10-CM | POA: Diagnosis not present

## 2019-07-23 DIAGNOSIS — R0989 Other specified symptoms and signs involving the circulatory and respiratory systems: Secondary | ICD-10-CM | POA: Diagnosis not present

## 2019-07-23 DIAGNOSIS — Z03818 Encounter for observation for suspected exposure to other biological agents ruled out: Secondary | ICD-10-CM | POA: Diagnosis not present

## 2019-07-24 DIAGNOSIS — E785 Hyperlipidemia, unspecified: Secondary | ICD-10-CM | POA: Diagnosis not present

## 2019-07-24 DIAGNOSIS — Z0000 Encounter for general adult medical examination without abnormal findings: Secondary | ICD-10-CM | POA: Diagnosis not present

## 2019-07-24 DIAGNOSIS — F3342 Major depressive disorder, recurrent, in full remission: Secondary | ICD-10-CM | POA: Diagnosis not present

## 2019-07-24 DIAGNOSIS — Z1389 Encounter for screening for other disorder: Secondary | ICD-10-CM | POA: Diagnosis not present

## 2019-07-24 DIAGNOSIS — J209 Acute bronchitis, unspecified: Secondary | ICD-10-CM | POA: Diagnosis not present

## 2019-07-24 DIAGNOSIS — F419 Anxiety disorder, unspecified: Secondary | ICD-10-CM | POA: Diagnosis not present

## 2019-07-24 DIAGNOSIS — E1169 Type 2 diabetes mellitus with other specified complication: Secondary | ICD-10-CM | POA: Diagnosis not present

## 2019-07-24 DIAGNOSIS — I10 Essential (primary) hypertension: Secondary | ICD-10-CM | POA: Diagnosis not present

## 2019-07-29 ENCOUNTER — Ambulatory Visit: Payer: PPO | Attending: Internal Medicine

## 2019-07-29 DIAGNOSIS — Z23 Encounter for immunization: Secondary | ICD-10-CM

## 2019-07-29 NOTE — Progress Notes (Signed)
   Covid-19 Vaccination Clinic  Name:  [name] [name]    MRN: [id number] DOB: [birth year]  07/29/2019  [name] was observed post Covid-19 immunization for 15 minutes without incidence. She was provided with Vaccine Information Sheet and instruction to access the V-Safe system.   [name] was instructed to call 911 with any severe reactions post vaccine: Marland Kitchen Difficulty breathing  . Swelling of your face and throat  . A fast heartbeat  . A bad rash all over your body  . Dizziness and weakness    Immunizations Administered    Name Date Dose VIS Date Route   Pfizer COVID-19 Vaccine 07/29/2019  9:52 AM 0.3 mL 05/24/2019 Intramuscular   Manufacturer: [place]   Lot: [id number]   [place]: [id number]

## 2019-08-20 DIAGNOSIS — M65341 Trigger finger, right ring finger: Secondary | ICD-10-CM | POA: Diagnosis not present

## 2019-08-20 DIAGNOSIS — M65332 Trigger finger, left middle finger: Secondary | ICD-10-CM | POA: Diagnosis not present

## 2019-10-31 DIAGNOSIS — N390 Urinary tract infection, site not specified: Secondary | ICD-10-CM | POA: Diagnosis not present

## 2019-11-13 DIAGNOSIS — R05 Cough: Secondary | ICD-10-CM | POA: Diagnosis not present

## 2019-11-20 ENCOUNTER — Ambulatory Visit: Payer: PPO | Admitting: Nurse Practitioner

## 2019-12-04 DIAGNOSIS — N3946 Mixed incontinence: Secondary | ICD-10-CM | POA: Diagnosis not present

## 2019-12-04 DIAGNOSIS — R3121 Asymptomatic microscopic hematuria: Secondary | ICD-10-CM | POA: Diagnosis not present

## 2019-12-04 DIAGNOSIS — N898 Other specified noninflammatory disorders of vagina: Secondary | ICD-10-CM | POA: Diagnosis not present

## 2019-12-10 DIAGNOSIS — N950 Postmenopausal bleeding: Secondary | ICD-10-CM | POA: Diagnosis not present

## 2019-12-18 DIAGNOSIS — Z1231 Encounter for screening mammogram for malignant neoplasm of breast: Secondary | ICD-10-CM | POA: Diagnosis not present

## 2019-12-19 ENCOUNTER — Telehealth: Payer: Self-pay | Admitting: Gastroenterology

## 2019-12-19 ENCOUNTER — Other Ambulatory Visit: Payer: Self-pay | Admitting: Gastroenterology

## 2019-12-19 NOTE — Telephone Encounter (Signed)
Patient needs to refill balsalazide 789m..Marland [facility] [address]

## 2019-12-19 NOTE — Telephone Encounter (Signed)
Prescription sent to patient's pharmacy and patient notified on prescription to schedule appt for further refills.

## 2019-12-23 DIAGNOSIS — N950 Postmenopausal bleeding: Secondary | ICD-10-CM | POA: Diagnosis not present

## 2019-12-30 DIAGNOSIS — N950 Postmenopausal bleeding: Secondary | ICD-10-CM | POA: Diagnosis not present

## 2019-12-30 DIAGNOSIS — N858 Other specified noninflammatory disorders of uterus: Secondary | ICD-10-CM | POA: Diagnosis not present

## 2019-12-30 DIAGNOSIS — R102 Pelvic and perineal pain: Secondary | ICD-10-CM | POA: Diagnosis not present

## 2020-01-01 ENCOUNTER — Ambulatory Visit
Admission: RE | Admit: 2020-01-01 | Discharge: 2020-01-01 | Disposition: A | Discharge status: Home / Self Care | Payer: PPO | Source: Ambulatory Visit | Attending: Obstetrics and Gynecology | Admitting: Obstetrics and Gynecology

## 2020-01-01 ENCOUNTER — Other Ambulatory Visit: Payer: Self-pay | Admitting: Obstetrics and Gynecology

## 2020-01-01 DIAGNOSIS — N3289 Other specified disorders of bladder: Secondary | ICD-10-CM | POA: Diagnosis not present

## 2020-01-01 DIAGNOSIS — N858 Other specified noninflammatory disorders of uterus: Secondary | ICD-10-CM | POA: Diagnosis not present

## 2020-01-01 DIAGNOSIS — R102 Pelvic and perineal pain: Secondary | ICD-10-CM

## 2020-01-01 DIAGNOSIS — N281 Cyst of kidney, acquired: Secondary | ICD-10-CM | POA: Diagnosis not present

## 2020-01-01 DIAGNOSIS — N854 Malposition of uterus: Secondary | ICD-10-CM | POA: Diagnosis not present

## 2020-01-01 MED ORDER — IOPAMIDOL (ISOVUE-300) INJECTION 61%
100.0000 mL | Freq: Once | INTRAVENOUS | Status: AC | PRN
  Administered 2020-01-01: 100 mL via INTRAVENOUS

## 2020-01-02 ENCOUNTER — Other Ambulatory Visit: Payer: Self-pay | Admitting: Obstetrics and Gynecology

## 2020-01-02 ENCOUNTER — Other Ambulatory Visit (HOSPITAL_COMMUNITY): Payer: Self-pay | Admitting: Obstetrics and Gynecology

## 2020-01-02 ENCOUNTER — Other Ambulatory Visit: Payer: Self-pay

## 2020-01-02 ENCOUNTER — Encounter (HOSPITAL_COMMUNITY): Payer: Self-pay | Admitting: Obstetrics and Gynecology

## 2020-01-02 ENCOUNTER — Other Ambulatory Visit (HOSPITAL_COMMUNITY)
Admission: RE | Admit: 2020-01-02 | Discharge: 2020-01-02 | Disposition: A | Discharge status: Home / Self Care | Payer: PPO | Source: Ambulatory Visit | Attending: Obstetrics and Gynecology | Admitting: Obstetrics and Gynecology

## 2020-01-02 DIAGNOSIS — Z20822 Contact with and (suspected) exposure to covid-19: Secondary | ICD-10-CM | POA: Diagnosis not present

## 2020-01-02 DIAGNOSIS — R102 Pelvic and perineal pain unspecified side: Secondary | ICD-10-CM

## 2020-01-02 DIAGNOSIS — N857 Hematometra: Secondary | ICD-10-CM

## 2020-01-02 DIAGNOSIS — Z01812 Encounter for preprocedural laboratory examination: Secondary | ICD-10-CM | POA: Insufficient documentation

## 2020-01-02 LAB — SARS CORONAVIRUS 2 (TAT 6-24 HRS): SARS Coronavirus 2: NEGATIVE

## 2020-01-02 NOTE — Progress Notes (Addendum)
Mrs. Fogal denies chest pain or shortness of breath.Mrs.Kinzie was tested for Covid this am and is in quarantine with her husband. Mrs. Veldman has type II diabetes,patient reports that CBG's run 120-130.  I instructed patient to check CBG after awaking and every 2 hours until arrival  to the hospital.  I Instructed patient if CBG is less than 70 to drink 1/2 cup of a clear juice. Recheck CBG in 15 minutes then call pre- op desk at [phone] for further instructions.  Mrs. Laguna does not take medications for DM in am. Mrs Kearn stated that she was not instructed to stop Plavix- I sent a staff message to Dr. Mancel Bale t see if she needed to. I instructed patient to stop all vitamins, herbal products and NSAIDs.

## 2020-01-02 NOTE — H&P (Signed)
[name] [name] is an 74 y.o. female G4P4 with an ongoing issue of PMB and exacerbated by pelvic pain following an EMB thought to be related to a hematometra.   Pt had bloating and PMB that led to an [place] with 5-2m endometrial stripe with cystic areas.  SHe underwent EMB with plastic pipelle after dilation with yellow flexible dilator.  BX return benign but scant endometrium with abundant blood.  Pt did well initially but began to have intermittnat  severe cramping s/p EMB after 3-4 days which increased in intensity by 1 week out.  No real urinary sx, just low pelvic pain. She had a w/u with UKoreaand CT scan showing no extrauterine process or abscess, just a distended endometrial cavity c/w possible hematometra from cervical stenosis and bleeding with bx.  Secondary pipelle still benign endometrial glands and blood.   Pertinent Gynecological History:  OB History:  NSVD x 4   Menstrual History:  No LMP recorded. Patient is postmenopausal.    Past Medical History:  Diagnosis Date  . Adenomatous colon polyp 11/2005  . Anginal pain ([place]    last time 2016  . Anxiety   . Arthritis   . CAD (coronary artery disease)    2V CABG 1996 (LIMA to LAD, SVG to RCA) Dr. GServando Snare; last stress test in 2010  . Diabetes mellitus without complication ([place]    type II  . GERD (gastroesophageal reflux disease)   . Headache   . Hyperlipidemia   . Hypertension   . Muscle cramps    and pains  . Myocardial infarction ([place]    1996  . Pneumonia   . Pre-diabetes   . Ulcerative colitis     Past Surgical History:  Procedure Laterality Date  . BACK SURGERY     lumbar fusion 25 years ago and ruptured disk 10 years ago same area  . BACK SURGERY     ruptered disc under lumbar disc  . CARDIAC CATHETERIZATION N/A 10/13/2014   Procedure: Left Heart Cath And Coronary Angiography;  Surgeon: CBurnell Blanks MD;  [facility];  Service: Cardiovascular;  Laterality: N/A;  . CARDIAC  CATHETERIZATION N/A 10/13/2014   Procedure: Coronary Stent Intervention;  Surgeon: CBurnell Blanks MD;  [facility];  Service: Cardiovascular;  Laterality: N/A;  . COLONOSCOPY    . CORONARY ANGIOPLASTY WITH STENT PLACEMENT  10/13/2014   [place] to [place] . CORONARY ARTERY BYPASS GRAFT  1996   x 2  . POLYPECTOMY    . UPPER GASTROINTESTINAL ENDOSCOPY      Family History  Problem Relation Age of Onset  . Diabetes Mother   . Heart disease Mother   . Heart attack Mother 664 . Hypertension Mother   . Lung cancer Brother   . Breast cancer Sister   . Kidney disease Brother        renal cancer  . Heart disease Sister   . Colon cancer Daughter   . Hypertension Sister   . Stroke Sister   . Pancreatitis Other   . Stomach cancer Neg Hx     Social History:  reports that she quit smoking about 25 years ago. She quit after 20.00 years of use. She has never used smokeless tobacco. She reports that she does not drink alcohol and does not use drugs.  Allergies: No Known Allergies  No medications prior to admission.    Review of Systems  Gastrointestinal: Positive for abdominal  pain.  Genitourinary: Positive for pelvic pain.       Postmenopausal bleeding    Height 5' 6"  (1.676 m), weight 79.4 kg. Physical Exam Cardiovascular:     Rate and Rhythm: Normal rate and regular rhythm.  Pulmonary:     Effort: Pulmonary effort is normal.     Breath sounds: Normal breath sounds.  Abdominal:     General: Abdomen is flat.     Palpations: Abdomen is soft.  Genitourinary:    General: Normal vulva.  Neurological:     Mental Status: She is alert.  Psychiatric:        Mood and Affect: Mood normal.     Results for orders placed or performed during the hospital encounter of 01/02/20 (from the past 24 hour(s))  SARS CORONAVIRUS 2 (TAT 6-24 HRS) Nasopharyngeal Nasopharyngeal Swab     Status: None   Collection Time: 01/02/20 10:51 AM   Specimen: Nasopharyngeal  Swab  Result Value Ref Range   SARS Coronavirus 2 NEGATIVE NEGATIVE    CT ABDOMEN PELVIS W CONTRAST  Result Date: 01/01/2020 CLINICAL DATA:  74 year old female with severe abdominal pain and vaginal bleeding peer EXAM: CT ABDOMEN AND PELVIS WITH CONTRAST TECHNIQUE: Multidetector CT imaging of the abdomen and pelvis was performed using the standard protocol following bolus administration of intravenous contrast. CONTRAST:  165m ISOVUE-300 IOPAMIDOL (ISOVUE-300) INJECTION 61% COMPARISON:  None. FINDINGS: Lower chest: The visualized lung bases are clear. No intra-abdominal free air or free fluid. Hepatobiliary: The liver is unremarkable. No intrahepatic biliary ductal dilatation. The gallbladder is unremarkable. Pancreas: Unremarkable. No pancreatic ductal dilatation or surrounding inflammatory changes. Spleen: Normal in size without focal abnormality. Adrenals/Urinary Tract: The adrenal glands unremarkable. There is no hydronephrosis on either side. There is symmetric enhancement and excretion of contrast by both kidneys. There is a 6.5 cm left renal cyst. The visualized ureters appear unremarkable. The urinary bladder is collapsed. Stomach/Bowel: There is loose stool throughout the colon compatible with diarrheal state. Correlation with clinical exam and stool cultures recommended. There is no bowel obstruction. The appendix is normal. Vascular/Lymphatic: No adenopathy. Reproductive: The uterus is retroverted or retroflexed. There is severe distension of the endometrium with fluid content and several pockets of air. Findings highly suspicious for cervical obstruction or stenosis possibly due to a neoplastic process in this postmenopausal female with history of vaginal bleeding. Further evaluation with hysteroscopy is recommended. Multiple pockets of air in the lower endometrium may be related to recent instrumentation or represent an infectious process. Clinical correlation is recommended. The ovaries are  grossly unremarkable. Other: None Musculoskeletal: Degenerative changes of the spine and osteopenia. No acute osseous pathology. IMPRESSION: 1. Severe distension of the endometrium highly suspicious for cervical obstruction or stenosis possibly due to a neoplastic process in this postmenopausal female with history of vaginal bleeding. Further evaluation with hysteroscopy is recommended. 2. Diarrheal state. Correlation with clinical exam and stool cultures recommended. No bowel obstruction. Normal appendix. Electronically Signed   By: AAnner CreteM.D.   On: 01/01/2020 16:05    Assessment/Plan: Pt with suspected hematometra s/p office EMB with related severe cramping.  We discussed the best way to resolve this is to have [place] evacuation of the clot. The patient was counseled regarding the hysteroscopy procedure in detail. We reviewed risks of bleeding and infection and possible uterine perforation. We also discussed the removal of any identified polyps or submucosal fibroids and what that would involve as well as evacuation of the clot. The patient desires to  proceed. She will use cytotec 464mg 3 hours prior to the procedure to aid with cervical dilation.  KLogan Bores7/22/2021, 8:58 PM

## 2020-01-02 NOTE — Anesthesia Preprocedure Evaluation (Addendum)
Anesthesia Evaluation  Patient identified by MRN, date of birth, ID band Patient awake    Reviewed: Allergy & Precautions, NPO status , Patient's Chart, lab work & pertinent test results, reviewed documented beta blocker date and time   Airway Mallampati: III  TM Distance: >3 FB Neck ROM: Full    Dental  (+) Teeth Intact, Caps, Implants   Pulmonary pneumonia, resolved, former smoker,    Pulmonary exam normal breath sounds clear to auscultation       Cardiovascular hypertension, Pt. on medications and Pt. on home beta blockers + angina with exertion + CAD, + Past MI, + Cardiac Stents and + CABG  Normal cardiovascular exam Rhythm:Regular Rate:Normal  CABG 2v 1996 MI 1996  EKG 02/22/19 NSR, septal infarct  Echo 06/29/17 Left ventricle: The cavity size was normal. Wall thickness was normal. Systolic function was normal. The estimated ejectionfraction was in the range of 60% to 65%. Wall motion was normal;there were no regional wall motion abnormalities. Features areconsistent with a pseudonormal left ventricular filling pattern,with concomitant abnormal relaxation and increased fillingpressure (grade 2 diastolic dysfunction).   -------------------------------------------------------------------  Cardiac Cath 10/31/14 . 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  2. Severe stenosis mid Circumflex felt to be the culprit lesion.  3. Successful PTCA/DES x 1 mid Circumflex 4. Normal LV systolic function    Neuro/Psych  Headaches, Anxiety    GI/Hepatic Neg liver ROS, PUD, GERD  Medicated and Controlled,  Endo/Other  diabetes, Well Controlled, Type 2, Oral Hypoglycemic AgentsHyperlipidemia  Renal/GU negative Renal ROS  negative genitourinary   Musculoskeletal  (+) Arthritis , Osteoarthritis,    Abdominal   Peds  Hematology Plavix therapy- last dose yesterday   Anesthesia Other Findings    Reproductive/Obstetrics Hematometra Pelvic pain                         Anesthesia Physical Anesthesia Plan  ASA: III  Anesthesia Plan: General   Post-op Pain Management:    Induction: Intravenous  PONV Risk Score and Plan: 4 or greater and Ondansetron, Dexamethasone and Treatment may vary due to age or medical condition  Airway Management Planned: LMA  Additional Equipment:   Intra-op Plan:   Post-operative Plan: Extubation in OR  Informed Consent: I have reviewed the patients History and Physical, chart, labs and discussed the procedure including the risks, benefits and alternatives for the proposed anesthesia with the patient or authorized representative who has indicated his/her understanding and acceptance.     Dental advisory given  Plan Discussed with: CRNA, Anesthesiologist and Surgeon  Anesthesia Plan Comments:        Anesthesia Quick Evaluation

## 2020-01-03 ENCOUNTER — Ambulatory Visit (HOSPITAL_COMMUNITY)
Admission: RE | Admit: 2020-01-03 | Discharge: 2020-01-03 | Disposition: A | Discharge status: Home / Self Care | Payer: PPO | Source: Ambulatory Visit | Attending: Obstetrics and Gynecology | Admitting: Obstetrics and Gynecology

## 2020-01-03 ENCOUNTER — Ambulatory Visit (HOSPITAL_COMMUNITY): Payer: PPO | Admitting: Anesthesiology

## 2020-01-03 ENCOUNTER — Encounter (HOSPITAL_COMMUNITY)
Admission: RE | Disposition: A | Discharge status: Home / Self Care | Payer: Self-pay | Source: Ambulatory Visit | Attending: Obstetrics and Gynecology

## 2020-01-03 ENCOUNTER — Other Ambulatory Visit: Payer: Self-pay

## 2020-01-03 ENCOUNTER — Encounter (HOSPITAL_COMMUNITY): Payer: Self-pay | Admitting: Obstetrics and Gynecology

## 2020-01-03 DIAGNOSIS — I10 Essential (primary) hypertension: Secondary | ICD-10-CM | POA: Insufficient documentation

## 2020-01-03 DIAGNOSIS — R102 Pelvic and perineal pain unspecified side: Secondary | ICD-10-CM

## 2020-01-03 DIAGNOSIS — Z955 Presence of coronary angioplasty implant and graft: Secondary | ICD-10-CM | POA: Diagnosis not present

## 2020-01-03 DIAGNOSIS — E119 Type 2 diabetes mellitus without complications: Secondary | ICD-10-CM | POA: Insufficient documentation

## 2020-01-03 DIAGNOSIS — N857 Hematometra: Secondary | ICD-10-CM

## 2020-01-03 DIAGNOSIS — Z87891 Personal history of nicotine dependence: Secondary | ICD-10-CM | POA: Diagnosis not present

## 2020-01-03 DIAGNOSIS — C541 Malignant neoplasm of endometrium: Secondary | ICD-10-CM | POA: Diagnosis not present

## 2020-01-03 DIAGNOSIS — M1990 Unspecified osteoarthritis, unspecified site: Secondary | ICD-10-CM | POA: Insufficient documentation

## 2020-01-03 DIAGNOSIS — Z951 Presence of aortocoronary bypass graft: Secondary | ICD-10-CM | POA: Diagnosis not present

## 2020-01-03 DIAGNOSIS — I252 Old myocardial infarction: Secondary | ICD-10-CM | POA: Insufficient documentation

## 2020-01-03 DIAGNOSIS — R935 Abnormal findings on diagnostic imaging of other abdominal regions, including retroperitoneum: Secondary | ICD-10-CM

## 2020-01-03 DIAGNOSIS — I2510 Atherosclerotic heart disease of native coronary artery without angina pectoris: Secondary | ICD-10-CM | POA: Diagnosis not present

## 2020-01-03 DIAGNOSIS — I25110 Atherosclerotic heart disease of native coronary artery with unstable angina pectoris: Secondary | ICD-10-CM | POA: Diagnosis not present

## 2020-01-03 DIAGNOSIS — N854 Malposition of uterus: Secondary | ICD-10-CM | POA: Diagnosis not present

## 2020-01-03 DIAGNOSIS — K219 Gastro-esophageal reflux disease without esophagitis: Secondary | ICD-10-CM | POA: Diagnosis not present

## 2020-01-03 HISTORY — DX: Gastro-esophageal reflux disease without esophagitis: K21.9

## 2020-01-03 HISTORY — DX: Type 2 diabetes mellitus without complications: E11.9

## 2020-01-03 HISTORY — DX: Pneumonia, unspecified organism: J18.9

## 2020-01-03 HISTORY — PX: DILATATION & CURETTAGE/HYSTEROSCOPY WITH MYOSURE: SHX6511

## 2020-01-03 HISTORY — PX: DILATION AND EVACUATION: SHX1459

## 2020-01-03 HISTORY — PX: OPERATIVE ULTRASOUND: SHX5996

## 2020-01-03 HISTORY — DX: Anxiety disorder, unspecified: F41.9

## 2020-01-03 LAB — BASIC METABOLIC PANEL
Anion gap: 10 (ref 5–15)
BUN: 7 mg/dL — ABNORMAL LOW (ref 8–23)
CO2: 29 mmol/L (ref 22–32)
Calcium: 9.2 mg/dL (ref 8.9–10.3)
Chloride: 100 mmol/L (ref 98–111)
Creatinine, Ser: 0.82 mg/dL (ref 0.44–1.00)
GFR calc Af Amer: >60 mL/min (ref >60)
GFR calc non Af Amer: >60 mL/min (ref >60)
Glucose, Bld: 128 mg/dL — ABNORMAL HIGH (ref 70–99)
Potassium: 3.6 mmol/L (ref 3.5–5.1)
Sodium: 139 mmol/L (ref 135–145)

## 2020-01-03 LAB — CBC
HCT: 34.2 % — ABNORMAL LOW (ref 36.0–46.0)
Hemoglobin: 11.1 g/dL — ABNORMAL LOW (ref 12.0–15.0)
MCH: 30 pg (ref 26.0–34.0)
MCHC: 32.5 g/dL (ref 30.0–36.0)
MCV: 92.4 fL (ref 80.0–100.0)
Platelets: 291 10*3/uL (ref 150–400)
RBC: 3.7 MIL/uL — ABNORMAL LOW (ref 3.87–5.11)
RDW: 12.8 % (ref 11.5–15.5)
WBC: 7.8 10*3/uL (ref 4.0–10.5)
nRBC: 0 % (ref 0.0–0.2)

## 2020-01-03 LAB — GLUCOSE, CAPILLARY
Glucose-Capillary: 128 mg/dL — ABNORMAL HIGH (ref 70–99)
Glucose-Capillary: 139 mg/dL — ABNORMAL HIGH (ref 70–99)

## 2020-01-03 LAB — TYPE AND SCREEN
ABO/RH(D): A POS
Antibody Screen: NEGATIVE

## 2020-01-03 LAB — ABO/RH: ABO/RH(D): A POS

## 2020-01-03 SURGERY — DILATATION & CURETTAGE/HYSTEROSCOPY WITH MYOSURE
Anesthesia: General | Site: Uterus

## 2020-01-03 MED ORDER — CHLORHEXIDINE GLUCONATE 0.12 % MT SOLN
15.0000 mL | Freq: Once | OROMUCOSAL | Status: AC
  Administered 2020-01-03: 15 mL via OROMUCOSAL
  Filled 2020-01-03: qty 15

## 2020-01-03 MED ORDER — ONDANSETRON HCL 4 MG/2ML IJ SOLN: 4.0000 mg | Freq: Once | INTRAMUSCULAR | Status: DC | PRN

## 2020-01-03 MED ORDER — TRANEXAMIC ACID-NACL 1000-0.7 MG/100ML-% IV SOLN
INTRAVENOUS | Status: DC | PRN
Start: 2020-01-03 — End: 2020-01-03
  Administered 2020-01-03: 1000 mg via INTRAVENOUS

## 2020-01-03 MED ORDER — ONDANSETRON HCL 4 MG/2ML IJ SOLN
INTRAMUSCULAR | Status: AC
  Filled 2020-01-03: qty 2

## 2020-01-03 MED ORDER — OXYCODONE HCL 5 MG/5ML PO SOLN: 5.0000 mg | Freq: Once | ORAL | Status: DC | PRN

## 2020-01-03 MED ORDER — GABAPENTIN 300 MG PO CAPS
300.0000 mg | ORAL_CAPSULE | ORAL | Status: AC
  Administered 2020-01-03: 300 mg via ORAL
  Filled 2020-01-03: qty 1

## 2020-01-03 MED ORDER — LIDOCAINE HCL 1 % IJ SOLN
INTRAMUSCULAR | Status: AC
  Filled 2020-01-03: qty 20

## 2020-01-03 MED ORDER — ACETAMINOPHEN 500 MG PO TABS
1000.0000 mg | ORAL_TABLET | ORAL | Status: AC
  Administered 2020-01-03: 1000 mg via ORAL
  Filled 2020-01-03: qty 2

## 2020-01-03 MED ORDER — ONDANSETRON HCL 4 MG/2ML IJ SOLN
INTRAMUSCULAR | Status: DC | PRN
  Administered 2020-01-03: 4 mg via INTRAVENOUS

## 2020-01-03 MED ORDER — DEXAMETHASONE SODIUM PHOSPHATE 10 MG/ML IJ SOLN
INTRAMUSCULAR | Status: AC
  Filled 2020-01-03: qty 1

## 2020-01-03 MED ORDER — FENTANYL CITRATE (PF) 100 MCG/2ML IJ SOLN
INTRAMUSCULAR | Status: AC
  Filled 2020-01-03: qty 2

## 2020-01-03 MED ORDER — LIDOCAINE IN D5W 4-5 MG/ML-% IV SOLN
INTRAVENOUS | Status: DC | PRN
Start: 2020-01-03 — End: 2020-01-03
  Administered 2020-01-03: 25 ug/kg/min via INTRAVENOUS

## 2020-01-03 MED ORDER — PROPOFOL 10 MG/ML IV BOLUS
INTRAVENOUS | Status: AC
  Filled 2020-01-03: qty 40

## 2020-01-03 MED ORDER — DEXAMETHASONE SODIUM PHOSPHATE 10 MG/ML IJ SOLN
INTRAMUSCULAR | Status: DC | PRN
  Administered 2020-01-03: 5 mg via INTRAVENOUS

## 2020-01-03 MED ORDER — FENTANYL CITRATE (PF) 250 MCG/5ML IJ SOLN
INTRAMUSCULAR | Status: AC
  Filled 2020-01-03: qty 5

## 2020-01-03 MED ORDER — POVIDONE-IODINE 10 % EX SWAB: 2.0000 "application " | Freq: Once | CUTANEOUS | Status: DC

## 2020-01-03 MED ORDER — LACTATED RINGERS IV SOLN: INTRAVENOUS | Status: DC

## 2020-01-03 MED ORDER — SILVER NITRATE-POT NITRATE 75-25 % EX MISC
CUTANEOUS | Status: AC
  Filled 2020-01-03: qty 10

## 2020-01-03 MED ORDER — SODIUM CHLORIDE 0.9 % IV SOLN
3.0000 g | Freq: Once | INTRAVENOUS | Status: AC
  Administered 2020-01-03: 3 g via INTRAVENOUS
  Filled 2020-01-03: qty 8

## 2020-01-03 MED ORDER — EPHEDRINE SULFATE-NACL 50-0.9 MG/10ML-% IV SOSY
PREFILLED_SYRINGE | INTRAVENOUS | Status: DC | PRN
  Administered 2020-01-03 (×3): 10 mg via INTRAVENOUS

## 2020-01-03 MED ORDER — MIDAZOLAM HCL 2 MG/2ML IJ SOLN
INTRAMUSCULAR | Status: AC
  Filled 2020-01-03: qty 2

## 2020-01-03 MED ORDER — FENTANYL CITRATE (PF) 100 MCG/2ML IJ SOLN
25.0000 ug | INTRAMUSCULAR | Status: DC | PRN
  Administered 2020-01-03: 50 ug via INTRAVENOUS

## 2020-01-03 MED ORDER — LIDOCAINE HCL 1 % IJ SOLN
INTRAMUSCULAR | Status: DC | PRN
  Administered 2020-01-03: 20 mL

## 2020-01-03 MED ORDER — PROPOFOL 10 MG/ML IV BOLUS
INTRAVENOUS | Status: DC | PRN
  Administered 2020-01-03: 120 mg via INTRAVENOUS

## 2020-01-03 MED ORDER — TRANEXAMIC ACID-NACL 1000-0.7 MG/100ML-% IV SOLN
INTRAVENOUS | Status: AC
  Filled 2020-01-03: qty 100

## 2020-01-03 MED ORDER — LIDOCAINE 2% (20 MG/ML) 5 ML SYRINGE
INTRAMUSCULAR | Status: AC
  Filled 2020-01-03: qty 5

## 2020-01-03 MED ORDER — OXYCODONE HCL 5 MG PO TABS: 5.0000 mg | ORAL_TABLET | Freq: Once | ORAL | Status: DC | PRN

## 2020-01-03 MED ORDER — FENTANYL CITRATE (PF) 100 MCG/2ML IJ SOLN
INTRAMUSCULAR | Status: DC | PRN
  Administered 2020-01-03: 75 ug via INTRAVENOUS
  Administered 2020-01-03: 25 ug via INTRAVENOUS

## 2020-01-03 MED ORDER — CARVEDILOL 3.125 MG PO TABS
ORAL_TABLET | ORAL | Status: AC
  Filled 2020-01-03: qty 1

## 2020-01-03 MED ORDER — ORAL CARE MOUTH RINSE: 15.0000 mL | Freq: Once | OROMUCOSAL | Status: AC

## 2020-01-03 MED ORDER — CARVEDILOL 3.125 MG PO TABS
3.1250 mg | ORAL_TABLET | Freq: Once | ORAL | Status: AC
  Administered 2020-01-03: 3.125 mg via ORAL
  Filled 2020-01-03: qty 1

## 2020-01-03 SURGICAL SUPPLY — 21 items
CANISTER SUCT 3000ML PPV (MISCELLANEOUS) ×3 IMPLANT
CATH FOLEY 2WAY SLVR 18FR 30CC (CATHETERS) ×3 IMPLANT
CATH ROBINSON RED A/P 16FR (CATHETERS) ×3 IMPLANT
DEVICE MYOSURE LITE (MISCELLANEOUS) ×3 IMPLANT
DEVICE MYOSURE REACH (MISCELLANEOUS) IMPLANT
GAUZE SPONGE 4X4 16PLY XRAY LF (GAUZE/BANDAGES/DRESSINGS) ×3 IMPLANT
GLOVE BIO SURGEON STRL SZ 6.5 (GLOVE) ×3 IMPLANT
GLOVE BIOGEL PI IND STRL 7.0 (GLOVE) ×2 IMPLANT
GLOVE BIOGEL PI INDICATOR 7.0 (GLOVE) ×1
GOWN STRL REUS W/ TWL LRG LVL3 (GOWN DISPOSABLE) ×4 IMPLANT
GOWN STRL REUS W/TWL LRG LVL3 (GOWN DISPOSABLE) ×6
KIT PROCEDURE FLUENT (KITS) ×3 IMPLANT
KIT TURNOVER KIT B (KITS) ×3 IMPLANT
PACK VAGINAL MINOR WOMEN LF (CUSTOM PROCEDURE TRAY) ×3 IMPLANT
PAD OB MATERNITY 4.3X12.25 (PERSONAL CARE ITEMS) ×3 IMPLANT
PLUG CATH AND CAP STER (CATHETERS) ×3 IMPLANT
SEAL ROD LENS SCOPE MYOSURE (ABLATOR) ×3 IMPLANT
SET BERKELEY SUCTION TUBING (SUCTIONS) ×3 IMPLANT
TOWEL GREEN STERILE FF (TOWEL DISPOSABLE) ×6 IMPLANT
UNDERPAD 30X36 HEAVY ABSORB (UNDERPADS AND DIAPERS) ×3 IMPLANT
VACURETTE 7MM CVD STRL WRAP (CANNULA) ×3 IMPLANT

## 2020-01-03 NOTE — Progress Notes (Signed)
Patient ID: [name] [name], female   DOB: [birth year], 74 y.o.   MRN: [id number] Per PACU, pt doing well and tolerating balloon fine.  Minimal vaginal bleeding.  Will d/c home and have return to office Monday 01/06/20 for removal of foley.

## 2020-01-03 NOTE — OR Nursing (Signed)
Dr. Royce Macadamia was notified when norma saline deficit reached 2500 ml per Fluent Machine. Please note normal saline fluid was also on the floor.

## 2020-01-03 NOTE — Progress Notes (Signed)
Patient ID: [name] [name], female   DOB: 1946/02/05, 74 y.o.   MRN: [id number] Per pt no changes in dictated H&P.  Brief exam WNL.  SHe has continued to have the severe intermittent cramping, but able to tolerate with po meds on board.

## 2020-01-03 NOTE — Anesthesia Postprocedure Evaluation (Signed)
Anesthesia Post Note  Patient: [name] [name]  Procedure(s) Performed: DILATATION & CURETTAGE/HYSTEROSCOPY WITH POSSIBLE MYOSURE (N/A ) OPERATIVE ULTRASOUND (N/A ) DILATATION AND EVACUATION (N/A Uterus)     Patient location during evaluation: PACU Anesthesia Type: General Level of consciousness: awake and alert and oriented Pain management: pain level controlled Vital Signs Assessment: post-procedure vital signs reviewed and stable Respiratory status: spontaneous breathing, nonlabored ventilation and respiratory function stable Cardiovascular status: blood pressure returned to baseline and stable Postop Assessment: no apparent nausea or vomiting Anesthetic complications: no   No complications documented.  Last Vitals:  Vitals:   01/03/20 1025 01/03/20 1040  BP: 124/74 (!) 130/68  Pulse: 85 81  Resp: 15 13  Temp:    SpO2: 96% 94%    Last Pain:  Vitals:   01/03/20 1040  TempSrc:   PainSc: 2                  Eulice Rutledge A.

## 2020-01-03 NOTE — Transfer of Care (Signed)
Immediate Anesthesia Transfer of Care Note  Patient: [name] [name]  Procedure(s) Performed: DILATATION & CURETTAGE/HYSTEROSCOPY WITH POSSIBLE MYOSURE (N/A ) OPERATIVE ULTRASOUND (N/A ) DILATATION AND EVACUATION (N/A Uterus)  Patient Location: PACU  Anesthesia Type:General  Level of Consciousness: drowsy  Airway & Oxygen Therapy: Patient Spontanous Breathing and Patient connected to face mask oxygen  Post-op Assessment: Report given to RN and Post -op Vital signs reviewed and stable  Post vital signs: Reviewed and stable  Last Vitals:  Vitals Value Taken Time  BP 108/84 01/03/20 1010  Temp    Pulse 77 01/03/20 1012  Resp 14 01/03/20 1012  SpO2 99 % 01/03/20 1012  Vitals shown include unvalidated device data.  Last Pain:  Vitals:   01/03/20 0644  TempSrc:   PainSc: 2       Patients Stated Pain Goal: 3 (00/93/81 8299)  Complications: No complications documented.

## 2020-01-03 NOTE — Anesthesia Procedure Notes (Signed)
Procedure Name: LMA Insertion Date/Time: 01/03/2020 8:32 AM Performed by: Malachi Paradise, RN Pre-anesthesia Checklist: Patient identified, Emergency Drugs available, Suction available and Patient being monitored Patient Re-evaluated:Patient Re-evaluated prior to induction Oxygen Delivery Method: Circle system utilized Preoxygenation: Pre-oxygenation with 100% oxygen Induction Type: IV induction Ventilation: Mask ventilation without difficulty LMA: LMA inserted LMA Size: 4.0 Number of attempts: 1 Placement Confirmation: positive ETCO2 and breath sounds checked- equal and bilateral Tube secured with: Tape Dental Injury: Teeth and Oropharynx as per pre-operative assessment

## 2020-01-03 NOTE — Op Note (Signed)
Operative Note    Preoperative Diagnosis Pelvic pain Suspected hematometra of uterus  Postoperative Diagnosis Same  Procedure Hysteroscopy with myosure and suction dilation and currettage  Surgeon Paula Compton, MD  Anesthesia LMA  Fluids: EBL 164m UOP 1040mstraight cath prior IVF 60042mysteroscopic deficit guess 1650m38mt not very accurate  Findings The endometrium was distended with a large amount of organized clot that could not be flushed out or pulled out, but required suction curettage to affect removal with US gKoreadance.  Uterus was retroverted. At the conclusion of the procedure the majority of the tissue was removed but a small bleeder noted at the fundus on ultrasound.  A foley balloon was placed and filled with 25mL13mNS to tamponade the cavity and hopefully prevent reaccumulation of clot.   Specimen Endometrial sampling and clot  Procedure Note Pt was taken to operating room where LMA anesthesia was obtained without difficulty.  She was prepped and draped in the normal sterile fashion in the dorsal lithotomy position.  An appropriate timeout was performed.  A speculum was placed in the vagina and 2cc of 1% plain lidocaine injected into the anterior cervix.  An additional 9cc was placed both at 2 and 10 o'clock for a paracervical block.  A single tooth tenaculum was placed on the anterior lip of the cervix. The cervix was slightly dilated from the pt's cytotec treatment and the sound was introduced into the uterus with US guKoreaance.  Because the uterus was steeply retroverted US guKoreaance was used for the whole procedure.  The myosure scope was easily introduced, but because the uterus cervix was partially distended with the clot, the back flow from the cervix was considerable and even placing another tenauclum posteriorly did not help.  THe deficit read high but most was on the floor and the tubing became dislodged from the drape, so all the fluid in the bag was  lost without calibrating it.  There was at no time fluid noted accumulating in the patient's pelvis. A sharp curettage was then perform to try to dislodge the clot, but only small amounts would be removed.  The decision was then made to try the suction curette.  The 8mm s32mion currette was obtained and introduced into the fundus under US guiKoreance.  With several passes, a large amount of tissue was obtained.  When no further tissue was noted, vaginal US conKorearmed the majority was removed, but there did appear to be a small bleeding area in the fundus on US.  SKoreace we did not want her to reacumulate another clot, a foley balloon was placed in the cavity and inflated with 25mL o79m.  US showKorea appropriate placement and no obvious brisk reacumulation of blood.     The tenaculum was removed and vaginal bleeding was minimal.  The patient was given TXA x 1 to hopefully help with any intrauterine bleeding.  All instruments and sponges were removed from vagina and the patient awakened and taken to the PACU in good condition.  We will monitor her bleeding and see how uncomfortable she is with the foley balloon to determine if can go home or needs to stay in observation.

## 2020-01-04 ENCOUNTER — Encounter (HOSPITAL_COMMUNITY): Payer: Self-pay | Admitting: Obstetrics and Gynecology

## 2020-01-06 ENCOUNTER — Ambulatory Visit: Payer: PPO | Admitting: Gastroenterology

## 2020-01-09 ENCOUNTER — Encounter: Payer: Self-pay | Admitting: Gynecologic Oncology

## 2020-01-09 ENCOUNTER — Telehealth: Payer: Self-pay | Admitting: Cardiovascular Disease

## 2020-01-09 ENCOUNTER — Telehealth: Payer: Self-pay | Admitting: *Deleted

## 2020-01-09 LAB — SURGICAL PATHOLOGY

## 2020-01-09 NOTE — Telephone Encounter (Signed)
To Dr. Angelena Form to make aware.   Last cath/intervention was 2016.

## 2020-01-09 NOTE — Telephone Encounter (Signed)
Called and scheduled the patient for a new patient appt for tomorrow

## 2020-01-09 NOTE — Progress Notes (Signed)
GYNECOLOGIC ONCOLOGY NEW PATIENT CONSULTATION   Patient Name: [name] [name]  Patient Age: 74 y.o. Date of Service: 01/10/2020 Referring Provider: Dr. 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  We reviewed the nature of endometrial cancer and its recommended surgical staging, including total hysterectomy, bilateral salpingo-oophorectomy, and lymph node assessment. The patient is a suitable candidate for staging via a minimally invasive approach to surgery.  We reviewed that robotic assistance would be used to complete the surgery.    We discussed that most endometrial cancer is detected early, however, we reviewed that adjuvant therapy will likely be recommended based on the patient's biopsy, however, we will defer to final pathology results.   Given her high risk histology, a CT scan preoperatively to rule out metastatic disease is indicated unlikely the patient has already had this imaging performed without evidence of adenopathy or other intraperitoneal disease.  We reviewed the sentinel lymph node technique. Risks and benefits of sentinel lymph node biopsy was reviewed. We reviewed the technique and ICG dye. The patient DOES NOT have an iodine allergy or known liver dysfunction. We reviewed the false negative rate (0.4%), and that 3% of patients with metastatic disease will not have it detected by SLN biopsy in endometrial cancer. A low risk of allergic reaction to the dye, <0.2% for ICG, has been reported. We also discussed that in the case of failed mapping, which occurs 40% of the time, a bilateral or unilateral lymphadenectomy will be performed at the surgeon's discretion.   Potential benefits of sentinel nodes including a higher detection rate for metastasis due to ultrastaging and potential reduction in operative morbidity. However, there remains uncertainty as  to the role for treatment of micrometastatic disease. Further, the benefit of operative morbidity associated with the SLN technique in endometrial cancer is not yet completely known. In other patient populations (e.g. the cervical cancer population) there has been observed reductions in morbidity with SLN biopsy compared to pelvic lymphadenectomy. Lymphedema, nerve dysfunction and lymphocysts are all potential risks with the SLN technique as with complete lymphadenectomy. Additional risks to the patient include the risk of damage to an internal organ while operating in an altered view (e.g. the black and white image of the robotic fluorescence imaging mode).   We discussed the plan for a robotic assisted hysterectomy, bilateral salpingo-oophorectomy, sentinel lymph node evaluation, possible lymph node dissection, possible laparotomy. The risks of surgery were discussed in detail and she understands these to include infection; wound separation; hernia; vaginal cuff separation, injury to adjacent organs such as bowel, bladder, blood vessels, ureters and nerves; bleeding which may require blood transfusion; anesthesia risk; thromboembolic events; possible death; unforeseen complications; possible need for re-exploration; medical complications such as heart attack, stroke, pleural effusion and pneumonia; and, if full lymphadenectomy is performed the risk of lymphedema and lymphocyst. The patient will receive DVT and antibiotic prophylaxis as indicated. She voiced a clear understanding. She had the opportunity to ask questions. Perioperative instructions were reviewed with her. Prescriptions for post-op medications were sent to her pharmacy of choice.  The patient is asymptomatic from a cardiac standpoint.  She follows with Dr. Angelena Form.  She was recently transitioned from her Plavix to baby aspirin before the D&C.  She is continued on the baby aspirin.  She saw him last in September 2020.  We will reach out to his  office and assure no other perioperative work-up  or recommendations before staging surgery.  A copy of this note was sent to the patient's referring provider.   Jeral Pinch, MD  Division of Gynecologic Oncology  Department of Obstetrics and Gynecology  [facility]  ___________________________________________  Chief Complaint: Chief Complaint  Patient presents with  . Malignant neoplasm of uterus, unspecified site [facility])    New Patient    History of Present Illness:  [name] [name] is a 4 y.o. y.o. female who is seen in consultation at the request of Dr. Marvel Plan for an evaluation of newly diagnosed high-grade uterine cancer.  Patient presented with abdominal bloating, pelvic pressure and vaginal discharge.  She underwent endometrial biopsy on 7/12.  That endometrial biopsy showed inactive endometrial glandular mucosa, no malignancy.  After her endometrial biopsy, she developed significant pelvic pain and cramping approximately 2 days later as well as ongoing bleeding.  She then underwent a saline infusion sonogram with findings of clot distending the uterine cavity; small amount of tissue removed at this time as well showing no hyperplasia or malignancy.  She was treated with Rocephin and Augmentin to cover possible endometritis.  The patient had a CT scan additionally that showed no extrauterine process or abscess, just a distended endometrial cavity.  Ultimately the patient was taken to the operating room on 7/23 for hysteroscopy with MyoSure and suction D&C with findings of significantly dilated endometrium with a large amount of organized clot.  Foley catheter will was filled with 25 cc to tamponade the cavity and prevent reaccumulation of clot after the procedure.  Today she notes that about 3 weeks ago, she began having dark brown spotting at first that then turned pink and ultimately red.  She was seen by Dr. Marvel Plan and underwent the following  work-up noted above.  Since her surgery, her bleeding is slowly decreasing and her pain has completely resolved.  She endorses a good appetite without nausea or emesis.  She reports no change to her bowel function.  She has a history of IBS and tends to be on the constipated side.  She sees Dr. Fuller Plan for this and uses stool softeners to keep her bowel function regular.  She has a long history of urge urinary incontinence which has required her to wear a pad for years.  She lives in [place] with her husband.  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  PAST MEDICAL HISTORY:  Past Medical History:  Diagnosis Date  . Adenomatous colon polyp 11/2005  . Anginal pain ([place])    last time 2016  . Anxiety   . Arthritis   . CAD (coronary artery disease)    2V CABG 1996 (LIMA to LAD, SVG to RCA) Dr. Servando Snare.; last stress test in 2010  . Diabetes mellitus without complication ([place])    type II  . GERD (gastroesophageal reflux disease)   . Headache   . Hyperlipidemia   . Hypertension   . Muscle cramps    and pains  . Myocardial infarction ([place])    1996  . Pneumonia   . Pre-diabetes   . Ulcerative colitis   . Uterine cancer ([place])      PAST SURGICAL HISTORY:  Past Surgical History:  Procedure Laterality Date  . BACK SURGERY     lumbar fusion 25 years ago and ruptured disk 10 years ago same area  . BACK SURGERY     ruptered disc under lumbar disc  . CARDIAC CATHETERIZATION N/A 10/13/2014  Procedure: Left Heart Cath And Coronary Angiography;  Surgeon: Burnell Blanks, MD;  Location: [place] [facility];  Service: Cardiovascular;  Laterality: N/A;  . CARDIAC CATHETERIZATION N/A 10/13/2014   Procedure: Coronary Stent Intervention;  Surgeon: Burnell Blanks, MD;  Location: [place] [facility];  Service: Cardiovascular;  Laterality: N/A;  . COLONOSCOPY    . CORONARY ANGIOPLASTY WITH STENT PLACEMENT  10/13/2014   DES to [place]  . CORONARY  ARTERY BYPASS GRAFT  1996   x 2  . DILATATION & CURETTAGE/HYSTEROSCOPY WITH MYOSURE N/A 01/03/2020   Procedure: DILATATION & CURETTAGE/HYSTEROSCOPY WITH POSSIBLE MYOSURE;  Surgeon: Paula Compton, MD;  Location: [place];  Service: Gynecology;  Laterality: N/A;  . DILATION AND EVACUATION N/A 01/03/2020   Procedure: DILATATION AND EVACUATION;  Surgeon: Paula Compton, MD;  Location: [place];  Service: Gynecology;  Laterality: N/A;  . OPERATIVE ULTRASOUND N/A 01/03/2020   Procedure: OPERATIVE ULTRASOUND;  Surgeon: Paula Compton, MD;  Location: [place];  Service: Gynecology;  Laterality: N/A;  . POLYPECTOMY    . UPPER GASTROINTESTINAL ENDOSCOPY      OB/GYN HISTORY:  OB History  Gravida Para Term Preterm AB Living  5 4          SAB TAB Ectopic Multiple Live Births               # Outcome Date GA Lbr Len/2nd Weight Sex Delivery Anes PTL Lv  5 Gravida           4 Para           3 Para           2 Para           1 Para             No LMP recorded. Patient is postmenopausal.  Age at menarche: 44 Age at menopause: 89 Hx of HRT: Denies Hx of STDs: Denies History of abnormal pap smears: Denies, last pap 2019  SCREENING STUDIES:  Last mammogram: 2021  Last colonoscopy: 2020  MEDICATIONS: Outpatient Encounter Medications as of 01/10/2020  Medication Sig  . balsalazide (COLAZAL) 750 MG capsule TAKE 2 CAPSULES (1,500 MG TOTAL) BY MOUTH 2 (TWO) TIMES DAILY. TAKE 2 CAPSULES TWO TIMES DAILY (Patient taking differently: Take 1,500 mg by mouth 2 (two) times daily. )  . calcium carbonate (CALCIUM 600) 600 MG TABS tablet Take 600 mg by mouth daily with breakfast.  . carvedilol (COREG) 3.125 MG tablet TAKE 1 TABLET (3.125 MG TOTAL) BY MOUTH 2 (TWO) TIMES DAILY.  Marland Kitchen Cholecalciferol (VITAMIN D) 50 MCG (2000 UT) tablet Take 2,000 Units by mouth daily.   Marland Kitchen docusate sodium (COLACE) 100 MG capsule Take 100 mg by mouth 2 (two) times daily.  Marland Kitchen FLUoxetine (PROZAC) 10 MG capsule Take 10 mg by mouth  daily.   Marland Kitchen ibuprofen (ADVIL) 600 MG tablet Take 600 mg by mouth every 6 (six) hours as needed.  Marland Kitchen LORazepam (ATIVAN) 0.5 MG tablet Take 0.5 mg by mouth as needed for anxiety (for anxiety). Reported on 12/04/2015  . losartan-hydrochlorothiazide (HYZAAR) 100-12.5 MG tablet Take 1 tablet by mouth daily.  . metFORMIN (GLUCOPHAGE-XR) 500 MG 24 hr tablet Take 500 mg by mouth daily with supper.   . Multiple Vitamins-Minerals (MULTIVITAMIN WITH MINERALS) tablet Take 1 tablet by mouth daily.    . pantoprazole (PROTONIX) 40 MG tablet TAKE 1 TABLET BY MOUTH EVERY DAY (Patient taking differently: Take 40 mg by mouth daily. )  .  simvastatin (ZOCOR) 40 MG tablet Take 40 mg by mouth every evening.   . [DISCONTINUED] oxyCODONE (OXY IR/ROXICODONE) 5 MG immediate release tablet Take 5 mg by mouth every 4 (four) hours as needed for severe pain.  . nitroGLYCERIN (NITROSTAT) 0.4 MG SL tablet Place 1 tablet (0.4 mg total) under the tongue every 5 (five) minutes as needed for chest pain.  Marland Kitchen oxyCODONE (OXY IR/ROXICODONE) 5 MG immediate release tablet Take 1 tablet (5 mg total) by mouth every 4 (four) hours as needed for severe pain. For AFTER surgery, do not take and drive  . senna-docusate (SENOKOT-S) 8.6-50 MG tablet Take 2 tablets by mouth at bedtime. For AFTER surgery, do not take if having diarrhea   No facility-administered encounter medications on file as of 01/10/2020.    ALLERGIES:  No Known Allergies   FAMILY HISTORY:  Family History  Problem Relation Age of Onset  . Diabetes Mother   . Heart disease Mother   . Heart attack Mother 46  . Hypertension Mother   . Lung cancer Brother   . Colon cancer Brother   . Breast cancer Sister   . Kidney disease Brother        renal cancer  . Heart disease Sister   . Colon cancer Daughter        Thinks her daughter had negative genetic testing  . Hypertension Sister   . Stroke Sister   . Pancreatitis Other   . Breast cancer Daughter   . Stomach cancer Neg Hx       SOCIAL HISTORY:    Social Connections:   . Frequency of Communication with Friends and Family:   . Frequency of Social Gatherings with Friends and Family:   . Attends Religious Services:   . Active Member of Clubs or Organizations:   . Attends Archivist Meetings:   Marland Kitchen Marital Status:     REVIEW OF SYSTEMS:  Endorses hot flashes, vaginal bleeding. Denies appetite changes, fevers, chills, fatigue, unexplained weight changes. Denies hearing loss, neck lumps or masses, mouth sores, ringing in ears or voice changes. Denies cough or wheezing.  Denies shortness of breath. Denies chest pain or palpitations. Denies leg swelling. Denies abdominal distention, pain, blood in stools, constipation, diarrhea, nausea, vomiting, or early satiety. Denies pain with intercourse, dysuria, frequency, hematuria or incontinence. Denies pelvic pain or vaginal discharge.   Denies joint pain, back pain or muscle pain/cramps. Denies itching, rash, or wounds. Denies dizziness, headaches, numbness or seizures. Denies swollen lymph nodes or glands, denies easy bruising or bleeding. Denies anxiety, depression, confusion, or decreased concentration.  Physical Exam:  Vital Signs for this encounter:  Blood pressure (!) 119/51, pulse 73, temperature 98.3 F (36.8 C), temperature source Oral, resp. rate 16, height 5' 6" (1.676 m), weight 174 lb (78.9 kg), SpO2 97 %. Body mass index is 28.08 kg/m. General: Alert, oriented, no acute distress.  HEENT: Normocephalic, atraumatic. Sclera anicteric.  Chest: Clear to auscultation bilaterally. No wheezes, rhonchi, or rales. Cardiovascular: Regular rate and rhythm, no murmurs, rubs, or gallops.  Abdomen:  Normoactive bowel sounds. Soft, nondistended, nontender to palpation. No masses or hepatosplenomegaly appreciated. No palpable fluid wave.  Extremities: Grossly normal range of motion. Warm, well perfused. No edema bilaterally.  Skin: No rashes or lesions.   Lymphatics: No cervical, supraclavicular, or inguinal adenopathy.  GU:  Normal external female genitalia. No lesions. No discharge or bleeding.             Bladder/urethra:  No lesions  or masses, well supported bladder             Vagina: Mild atrophy, small amount of older appearing blood within the vaginal vault.             Cervix: Normal appearing, no lesions.             Uterus: Small, mobile, no parametrial involvement or nodularity.             Adnexa: No masses appreciated.  Rectal: No nodularity.  LABORATORY AND RADIOLOGIC DATA:  Outside medical records were reviewed to synthesize the above history, along with the history and physical obtained during the visit.   Lab Results  Component Value Date   WBC 7.8 01/03/2020   HGB 11.1 (L) 01/03/2020   HCT 34.2 (L) 01/03/2020   PLT 291 01/03/2020   GLUCOSE 128 (H) 01/03/2020   CHOL 147 08/17/2017   TRIG 79 08/17/2017   HDL 55 08/17/2017   LDLCALC 76 08/17/2017   ALT 11 08/17/2017   AST 14 08/17/2017   NA 139 01/03/2020   K 3.6 01/03/2020   CL 100 01/03/2020   CREATININE 0.82 01/03/2020   BUN 7 (L) 01/03/2020   CO2 29 01/03/2020   TSH 0.40 12/12/2011   INR 1.0 10/10/2014   7/23: Hysteroscopy with myosure and suction dilation and currettage A. ENDOMETRIUM, CURETTAGE:  - Poorly differentiated malignancy  - See comment  COMMENT:  The material consists of a poorly differentiated malignancy with  extensive necrosis. By immunohistochemistry, p16 is positive and there  is focal cytokeratin AE1/3 positivity but negative for cytokeratin 7,  cytokeratin 5/6, CD20, CD3, p53, WT-1, Pax-8, and S100. The differential  includes poorly differentiated carcinoma and high-grade stromal sarcoma.  Dr. Saralyn Pilar reviewed the case and agrees with the above diagnosis. Dr.  Marvel Plan was notified of these results on January 08, 2020.   CT A/P on 7/21: 1. Severe distension of the endometrium highly suspicious for cervical obstruction or  stenosis possibly due to a neoplastic process in this postmenopausal female with history of vaginal bleeding. Further evaluation with hysteroscopy is recommended. 2. Diarrheal state. Correlation with clinical exam and stool cultures recommended. No bowel obstruction. Normal appendix.

## 2020-01-09 NOTE — H&P (View-Only) (Signed)
GYNECOLOGIC ONCOLOGY NEW PATIENT CONSULTATION   Patient Name: [name] [name]  Patient Age: 74 y.o. Date of Service: 01/10/2020 Referring Provider: Dr. 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  We reviewed the nature of endometrial cancer and its recommended surgical staging, including total hysterectomy, bilateral salpingo-oophorectomy, and lymph node assessment. The patient is a suitable candidate for staging via a minimally invasive approach to surgery.  We reviewed that robotic assistance would be used to complete the surgery.    We discussed that most endometrial cancer is detected early, however, we reviewed that adjuvant therapy will likely be recommended based on the patient's biopsy, however, we will defer to final pathology results.   Given her high risk histology, a CT scan preoperatively to rule out metastatic disease is indicated unlikely the patient has already had this imaging performed without evidence of adenopathy or other intraperitoneal disease.  We reviewed the sentinel lymph node technique. Risks and benefits of sentinel lymph node biopsy was reviewed. We reviewed the technique and ICG dye. The patient DOES NOT have an iodine allergy or known liver dysfunction. We reviewed the false negative rate (0.4%), and that 3% of patients with metastatic disease will not have it detected by SLN biopsy in endometrial cancer. A low risk of allergic reaction to the dye, <0.2% for ICG, has been reported. We also discussed that in the case of failed mapping, which occurs 40% of the time, a bilateral or unilateral lymphadenectomy will be performed at the surgeon's discretion.   Potential benefits of sentinel nodes including a higher detection rate for metastasis due to ultrastaging and potential reduction in operative morbidity. However, there remains uncertainty as  to the role for treatment of micrometastatic disease. Further, the benefit of operative morbidity associated with the SLN technique in endometrial cancer is not yet completely known. In other patient populations (e.g. the cervical cancer population) there has been observed reductions in morbidity with SLN biopsy compared to pelvic lymphadenectomy. Lymphedema, nerve dysfunction and lymphocysts are all potential risks with the SLN technique as with complete lymphadenectomy. Additional risks to the patient include the risk of damage to an internal organ while operating in an altered view (e.g. the black and white image of the robotic fluorescence imaging mode).   We discussed the plan for a robotic assisted hysterectomy, bilateral salpingo-oophorectomy, sentinel lymph node evaluation, possible lymph node dissection, possible laparotomy. The risks of surgery were discussed in detail and she understands these to include infection; wound separation; hernia; vaginal cuff separation, injury to adjacent organs such as bowel, bladder, blood vessels, ureters and nerves; bleeding which may require blood transfusion; anesthesia risk; thromboembolic events; possible death; unforeseen complications; possible need for re-exploration; medical complications such as heart attack, stroke, pleural effusion and pneumonia; and, if full lymphadenectomy is performed the risk of lymphedema and lymphocyst. The patient will receive DVT and antibiotic prophylaxis as indicated. She voiced a clear understanding. She had the opportunity to ask questions. Perioperative instructions were reviewed with her. Prescriptions for post-op medications were sent to her pharmacy of choice.  The patient is asymptomatic from a cardiac standpoint.  She follows with Dr. McAlhany.  She was recently transitioned from her Plavix to baby aspirin before the D&C.  She is continued on the baby aspirin.  She saw him last in September 2020.  We will reach out to his  office and assure no other perioperative work-up   or recommendations before staging surgery.  A copy of this note was sent to the patient's referring provider.   Burl Tauzin, MD  Division of Gynecologic Oncology  Department of Obstetrics and Gynecology  [facility]  ___________________________________________  Chief Complaint: Chief Complaint  Patient presents with  . Malignant neoplasm of uterus, unspecified site (HCC)    New Patient    History of Present Illness:  [name] [name] is a 74 y.o. y.o. female who is seen in consultation at the request of Dr. Richardson for an evaluation of newly diagnosed high-grade uterine cancer.  Patient presented with abdominal bloating, pelvic pressure and vaginal discharge.  She underwent endometrial biopsy on 7/12.  That endometrial biopsy showed inactive endometrial glandular mucosa, no malignancy.  After her endometrial biopsy, she developed significant pelvic pain and cramping approximately 2 days later as well as ongoing bleeding.  She then underwent a saline infusion sonogram with findings of clot distending the uterine cavity; small amount of tissue removed at this time as well showing no hyperplasia or malignancy.  She was treated with Rocephin and Augmentin to cover possible endometritis.  The patient had a CT scan additionally that showed no extrauterine process or abscess, just a distended endometrial cavity.  Ultimately the patient was taken to the operating room on 7/23 for hysteroscopy with MyoSure and suction D&C with findings of significantly dilated endometrium with a large amount of organized clot.  Foley catheter will was filled with 25 cc to tamponade the cavity and prevent reaccumulation of clot after the procedure.  Today she notes that about 3 weeks ago, she began having dark brown spotting at first that then turned pink and ultimately red.  She was seen by Dr. Richardson and underwent the following  work-up noted above.  Since her surgery, her bleeding is slowly decreasing and her pain has completely resolved.  She endorses a good appetite without nausea or emesis.  She reports no change to her bowel function.  She has a history of IBS and tends to be on the constipated side.  She sees Dr. Stark for this and uses stool softeners to keep her bowel function regular.  She has a long history of urge urinary incontinence which has required her to wear a pad for years.  She lives in [place] with her husband.  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  PAST MEDICAL HISTORY:  Past Medical History:  Diagnosis Date  . Adenomatous colon polyp 11/2005  . Anginal pain (HCC)    last time 2016  . Anxiety   . Arthritis   . CAD (coronary artery disease)    2V CABG 1996 (LIMA to LAD, SVG to RCA) Dr. Gerhardt.; last stress test in 2010  . Diabetes mellitus without complication (HCC)    type II  . GERD (gastroesophageal reflux disease)   . Headache   . Hyperlipidemia   . Hypertension   . Muscle cramps    and pains  . Myocardial infarction (HCC)    1996  . Pneumonia   . Pre-diabetes   . Ulcerative colitis   . Uterine cancer (HCC)      PAST SURGICAL HISTORY:  Past Surgical History:  Procedure Laterality Date  . BACK SURGERY     lumbar fusion 25 years ago and ruptured disk 10 years ago same area  . BACK SURGERY     ruptered disc under lumbar disc  . CARDIAC CATHETERIZATION N/A 10/13/2014     Procedure: Left Heart Cath And Coronary Angiography;  Surgeon: Christopher D McAlhany, MD;  Location: [facility];  Service: Cardiovascular;  Laterality: N/A;  . CARDIAC CATHETERIZATION N/A 10/13/2014   Procedure: Coronary Stent Intervention;  Surgeon: Christopher D McAlhany, MD;  Location: [facility];  Service: Cardiovascular;  Laterality: N/A;  . COLONOSCOPY    . CORONARY ANGIOPLASTY WITH STENT PLACEMENT  10/13/2014   DES to MID CIRCUMFLEX  . CORONARY  ARTERY BYPASS GRAFT  1996   x 2  . DILATATION & CURETTAGE/HYSTEROSCOPY WITH MYOSURE N/A 01/03/2020   Procedure: DILATATION & CURETTAGE/HYSTEROSCOPY WITH POSSIBLE MYOSURE;  Surgeon: Richardson, Kathy, MD;  Location: [facility] OR;  Service: Gynecology;  Laterality: N/A;  . DILATION AND EVACUATION N/A 01/03/2020   Procedure: DILATATION AND EVACUATION;  Surgeon: Richardson, Kathy, MD;  Location: [facility] OR;  Service: Gynecology;  Laterality: N/A;  . OPERATIVE ULTRASOUND N/A 01/03/2020   Procedure: OPERATIVE ULTRASOUND;  Surgeon: Richardson, Kathy, MD;  Location: [facility] OR;  Service: Gynecology;  Laterality: N/A;  . POLYPECTOMY    . UPPER GASTROINTESTINAL ENDOSCOPY      OB/GYN HISTORY:  OB History  Gravida Para Term Preterm AB Living  5 4          SAB TAB Ectopic Multiple Live Births               # Outcome Date GA Lbr Len/2nd Weight Sex Delivery Anes PTL Lv  5 Gravida           4 Para           3 Para           2 Para           1 Para             No LMP recorded. Patient is postmenopausal.  Age at menarche: 14 Age at menopause: 86 Hx of HRT: Denies Hx of STDs: Denies History of abnormal pap smears: Denies, last pap 2019  SCREENING STUDIES:  Last mammogram: 2021  Last colonoscopy: 2020  MEDICATIONS: Outpatient Encounter Medications as of 01/10/2020  Medication Sig  . balsalazide (COLAZAL) 750 MG capsule TAKE 2 CAPSULES (1,500 MG TOTAL) BY MOUTH 2 (TWO) TIMES DAILY. TAKE 2 CAPSULES TWO TIMES DAILY (Patient taking differently: Take 1,500 mg by mouth 2 (two) times daily. )  . calcium carbonate (CALCIUM 600) 600 MG TABS tablet Take 600 mg by mouth daily with breakfast.  . carvedilol (COREG) 3.125 MG tablet TAKE 1 TABLET (3.125 MG TOTAL) BY MOUTH 2 (TWO) TIMES DAILY.  . Cholecalciferol (VITAMIN D) 50 MCG (2000 UT) tablet Take 2,000 Units by mouth daily.   . docusate sodium (COLACE) 100 MG capsule Take 100 mg by mouth 2 (two) times daily.  . FLUoxetine (PROZAC) 10 MG capsule Take 10 mg by mouth  daily.   . ibuprofen (ADVIL) 600 MG tablet Take 600 mg by mouth every 6 (six) hours as needed.  . LORazepam (ATIVAN) 0.5 MG tablet Take 0.5 mg by mouth as needed for anxiety (for anxiety). Reported on 12/04/2015  . losartan-hydrochlorothiazide (HYZAAR) 100-12.5 MG tablet Take 1 tablet by mouth daily.  . metFORMIN (GLUCOPHAGE-XR) 500 MG 24 hr tablet Take 500 mg by mouth daily with supper.   . Multiple Vitamins-Minerals (MULTIVITAMIN WITH MINERALS) tablet Take 1 tablet by mouth daily.    . pantoprazole (PROTONIX) 40 MG tablet TAKE 1 TABLET BY MOUTH EVERY DAY (Patient taking differently: Take 40 mg by mouth daily. )  .   simvastatin (ZOCOR) 40 MG tablet Take 40 mg by mouth every evening.   . [DISCONTINUED] oxyCODONE (OXY IR/ROXICODONE) 5 MG immediate release tablet Take 5 mg by mouth every 4 (four) hours as needed for severe pain.  . nitroGLYCERIN (NITROSTAT) 0.4 MG SL tablet Place 1 tablet (0.4 mg total) under the tongue every 5 (five) minutes as needed for chest pain.  . oxyCODONE (OXY IR/ROXICODONE) 5 MG immediate release tablet Take 1 tablet (5 mg total) by mouth every 4 (four) hours as needed for severe pain. For AFTER surgery, do not take and drive  . senna-docusate (SENOKOT-S) 8.6-50 MG tablet Take 2 tablets by mouth at bedtime. For AFTER surgery, do not take if having diarrhea   No facility-administered encounter medications on file as of 01/10/2020.    ALLERGIES:  No Known Allergies   FAMILY HISTORY:  Family History  Problem Relation Age of Onset  . Diabetes Mother   . Heart disease Mother   . Heart attack Mother 67  . Hypertension Mother   . Lung cancer Brother   . Colon cancer Brother   . Breast cancer Sister   . Kidney disease Brother        renal cancer  . Heart disease Sister   . Colon cancer Daughter        Thinks her daughter had negative genetic testing  . Hypertension Sister   . Stroke Sister   . Pancreatitis Other   . Breast cancer Daughter   . Stomach cancer Neg Hx       SOCIAL HISTORY:    Social Connections:   . Frequency of Communication with Friends and Family:   . Frequency of Social Gatherings with Friends and Family:   . Attends Religious Services:   . Active Member of Clubs or Organizations:   . Attends Club or Organization Meetings:   . Marital Status:     REVIEW OF SYSTEMS:  Endorses hot flashes, vaginal bleeding. Denies appetite changes, fevers, chills, fatigue, unexplained weight changes. Denies hearing loss, neck lumps or masses, mouth sores, ringing in ears or voice changes. Denies cough or wheezing.  Denies shortness of breath. Denies chest pain or palpitations. Denies leg swelling. Denies abdominal distention, pain, blood in stools, constipation, diarrhea, nausea, vomiting, or early satiety. Denies pain with intercourse, dysuria, frequency, hematuria or incontinence. Denies pelvic pain or vaginal discharge.   Denies joint pain, back pain or muscle pain/cramps. Denies itching, rash, or wounds. Denies dizziness, headaches, numbness or seizures. Denies swollen lymph nodes or glands, denies easy bruising or bleeding. Denies anxiety, depression, confusion, or decreased concentration.  Physical Exam:  Vital Signs for this encounter:  Blood pressure (!) 119/51, pulse 73, temperature 98.3 F (36.8 C), temperature source Oral, resp. rate 16, height 5' 6" (1.676 m), weight 174 lb (78.9 kg), SpO2 97 %. Body mass index is 28.08 kg/m. General: Alert, oriented, no acute distress.  HEENT: Normocephalic, atraumatic. Sclera anicteric.  Chest: Clear to auscultation bilaterally. No wheezes, rhonchi, or rales. Cardiovascular: Regular rate and rhythm, no murmurs, rubs, or gallops.  Abdomen:  Normoactive bowel sounds. Soft, nondistended, nontender to palpation. No masses or hepatosplenomegaly appreciated. No palpable fluid wave.  Extremities: Grossly normal range of motion. Warm, well perfused. No edema bilaterally.  Skin: No rashes or lesions.   Lymphatics: No cervical, supraclavicular, or inguinal adenopathy.  GU:  Normal external female genitalia. No lesions. No discharge or bleeding.             Bladder/urethra:  No lesions   or masses, well supported bladder             Vagina: Mild atrophy, small amount of older appearing blood within the vaginal vault.             Cervix: Normal appearing, no lesions.             Uterus: Small, mobile, no parametrial involvement or nodularity.             Adnexa: No masses appreciated.  Rectal: No nodularity.  LABORATORY AND RADIOLOGIC DATA:  Outside medical records were reviewed to synthesize the above history, along with the history and physical obtained during the visit.   Lab Results  Component Value Date   WBC 7.8 01/03/2020   HGB 11.1 (L) 01/03/2020   HCT 34.2 (L) 01/03/2020   PLT 291 01/03/2020   GLUCOSE 128 (H) 01/03/2020   CHOL 147 08/17/2017   TRIG 79 08/17/2017   HDL 55 08/17/2017   LDLCALC 76 08/17/2017   ALT 11 08/17/2017   AST 14 08/17/2017   NA 139 01/03/2020   K 3.6 01/03/2020   CL 100 01/03/2020   CREATININE 0.82 01/03/2020   BUN 7 (L) 01/03/2020   CO2 29 01/03/2020   TSH 0.40 12/12/2011   INR 1.0 10/10/2014   7/23: Hysteroscopy with myosure and suction dilation and currettage A. ENDOMETRIUM, CURETTAGE:  - Poorly differentiated malignancy  - See comment  COMMENT:  The material consists of a poorly differentiated malignancy with  extensive necrosis. By immunohistochemistry, p16 is positive and there  is focal cytokeratin AE1/3 positivity but negative for cytokeratin 7,  cytokeratin 5/6, CD20, CD3, p53, WT-1, Pax-8, and S100. The differential  includes poorly differentiated carcinoma and high-grade stromal sarcoma.  Dr. Patrick reviewed the case and agrees with the above diagnosis. Dr.  Richardson was notified of these results on January 08, 2020.   CT A/P on 7/21: 1. Severe distension of the endometrium highly suspicious for cervical obstruction or  stenosis possibly due to a neoplastic process in this postmenopausal female with history of vaginal bleeding. Further evaluation with hysteroscopy is recommended. 2. Diarrheal state. Correlation with clinical exam and stool cultures recommended. No bowel obstruction. Normal appendix. 

## 2020-01-09 NOTE — Telephone Encounter (Signed)
Thank you :)

## 2020-01-09 NOTE — Telephone Encounter (Signed)
New message  Patient is calling in to make Dr. Angelena Form aware that she has been diagnosed with Uterus cancer and the oncologist has instructed her to stop taking the Plavix for now.

## 2020-01-09 NOTE — Telephone Encounter (Signed)
Spoke w pt  She has appointment with surgery tomorrow to plan hysterectomy.  She and her husband have been scheduled w Dr. Angelena Form in Nov.  Will move appointment up if able to find 2 spots together.  Due Sept 2021.

## 2020-01-10 ENCOUNTER — Encounter: Payer: Self-pay | Admitting: Gynecologic Oncology

## 2020-01-10 ENCOUNTER — Other Ambulatory Visit: Payer: Self-pay | Admitting: Gynecologic Oncology

## 2020-01-10 ENCOUNTER — Inpatient Hospital Stay: Payer: PPO | Attending: Gynecologic Oncology | Admitting: Gynecologic Oncology

## 2020-01-10 ENCOUNTER — Other Ambulatory Visit: Payer: Self-pay

## 2020-01-10 VITALS — BP 119/51 | HR 73 | Temp 98.3°F | Resp 16 | Ht 66.0 in | Wt 174.0 lb

## 2020-01-10 DIAGNOSIS — Z79899 Other long term (current) drug therapy: Secondary | ICD-10-CM | POA: Insufficient documentation

## 2020-01-10 DIAGNOSIS — I252 Old myocardial infarction: Secondary | ICD-10-CM | POA: Diagnosis not present

## 2020-01-10 DIAGNOSIS — C55 Malignant neoplasm of uterus, part unspecified: Secondary | ICD-10-CM

## 2020-01-10 DIAGNOSIS — I2510 Atherosclerotic heart disease of native coronary artery without angina pectoris: Secondary | ICD-10-CM | POA: Insufficient documentation

## 2020-01-10 DIAGNOSIS — Z7902 Long term (current) use of antithrombotics/antiplatelets: Secondary | ICD-10-CM | POA: Diagnosis not present

## 2020-01-10 DIAGNOSIS — K219 Gastro-esophageal reflux disease without esophagitis: Secondary | ICD-10-CM | POA: Insufficient documentation

## 2020-01-10 DIAGNOSIS — E119 Type 2 diabetes mellitus without complications: Secondary | ICD-10-CM | POA: Diagnosis not present

## 2020-01-10 DIAGNOSIS — I10 Essential (primary) hypertension: Secondary | ICD-10-CM | POA: Insufficient documentation

## 2020-01-10 DIAGNOSIS — Z955 Presence of coronary angioplasty implant and graft: Secondary | ICD-10-CM | POA: Insufficient documentation

## 2020-01-10 DIAGNOSIS — K589 Irritable bowel syndrome without diarrhea: Secondary | ICD-10-CM | POA: Diagnosis not present

## 2020-01-10 DIAGNOSIS — Z951 Presence of aortocoronary bypass graft: Secondary | ICD-10-CM | POA: Diagnosis not present

## 2020-01-10 DIAGNOSIS — Z7984 Long term (current) use of oral hypoglycemic drugs: Secondary | ICD-10-CM | POA: Diagnosis not present

## 2020-01-10 DIAGNOSIS — Z791 Long term (current) use of non-steroidal anti-inflammatories (NSAID): Secondary | ICD-10-CM | POA: Insufficient documentation

## 2020-01-10 DIAGNOSIS — E785 Hyperlipidemia, unspecified: Secondary | ICD-10-CM | POA: Diagnosis not present

## 2020-01-10 DIAGNOSIS — F419 Anxiety disorder, unspecified: Secondary | ICD-10-CM | POA: Insufficient documentation

## 2020-01-10 DIAGNOSIS — M1990 Unspecified osteoarthritis, unspecified site: Secondary | ICD-10-CM | POA: Insufficient documentation

## 2020-01-10 MED ORDER — OXYCODONE HCL 5 MG PO TABS: 5.0000 mg | ORAL_TABLET | ORAL | 0 refills | Status: DC | PRN

## 2020-01-10 MED ORDER — SENNOSIDES-DOCUSATE SODIUM 8.6-50 MG PO TABS
2.0000 | ORAL_TABLET | Freq: Every day | ORAL | 0 refills | Status: DC
Start: 2020-01-10 — End: 2020-02-27

## 2020-01-10 NOTE — Patient Instructions (Signed)
Preparing for your Surgery  Plan for surgery on January 20, 2020 with Dr. Jeral Pinch at [facility]. You will be scheduled for a robotic assisted total laparoscopic hysterectomy (removal of uterus and cervix), bilateral salpingo-oophorectomy (removal of ovaries and fallopian tubes), sentinel lymph node biopsy, possible lymph node dissection, possible laparotomy.   WE WILL REACH OUT TO YOUR CARDIOLOGIST FOR CARDIAC CLEARANCE. You are fine to continue taking your aspirin 81 mg.  Pre-operative Testing -You will receive a phone call from presurgical testing at [facility] to arrange for a pre-operative appointment over the phone, lab appointment, and COVID test. The COVID test normally happens 3 days prior to the surgery and they ask that you self quarantine after the test up until surgery to decrease chance of exposure.  -Bring your insurance card, copy of an advanced directive if applicable, medication list  -At that visit, you will be asked to sign a consent for a possible blood transfusion in case a transfusion becomes necessary during surgery.  The need for a blood transfusion is rare but having consent is a necessary part of your care.     -Do not take supplements such as fish oil (omega 3), red yeast rice, turmeric before your surgery.   Day Before Surgery at [place] will be asked to take in a light diet the day before surgery. You will be advised you can have clear liquids after midnight and up until 3 hours before your surgery.    Eat a light diet the day before surgery.  Examples including soups, broths, toast, yogurt, mashed potatoes.  AVOID GAS PRODUCING FOODS. Things to avoid include carbonated beverages (fizzy beverages), raw fruits and raw vegetables, or beans.   If your bowels are filled with gas, your surgeon will have difficulty visualizing your pelvic organs which increases your surgical risks.  Your role in recovery Your role is to become  active as soon as directed by your doctor, while still giving yourself time to heal.  Rest when you feel tired. You will be asked to do the following in order to speed your recovery:  - Cough and breathe deeply. This helps to clear and expand your lungs and can prevent pneumonia after surgery.  - [place]. Do mild physical activity. Walking or moving your legs help your circulation and body functions return to normal. Do not try to get up or walk alone the first time after surgery.   -If you develop swelling on one leg or the other, pain in the back of your leg, redness/warmth in one of your legs, please call the office or go to the Emergency Room to have a doppler to rule out a blood clot. For shortness of breath, chest pain-seek care in the Emergency Room as soon as possible. - Actively manage your pain. Managing your pain lets you move in comfort. We will ask you to rate your pain on a scale of zero to 10. It is your responsibility to tell your doctor or nurse where and how much you hurt so your pain can be treated.  Special Considerations -If you are diabetic, you may be placed on insulin after surgery to have closer control over your blood sugars to promote healing and recovery.  This does not mean that you will be discharged on insulin.  If applicable, your oral antidiabetics will be resumed when you are tolerating a solid diet.  -Your final pathology results from surgery should be  available around one week after surgery and the results will be relayed to you when available.  -FMLA forms can be faxed to [phone] and please allow 5-7 business days for completion.  Pain Management After Surgery -You have been prescribed your pain medication and bowel regimen medications before surgery so that you can have these available when you are discharged from the hospital. The pain medication is for use ONLY AFTER surgery and a new prescription will not be given.   -Make sure that  you have Tylenol and Ibuprofen at home to use on a regular basis after surgery for pain control. We recommend alternating the medications every hour to six hours since they work differently and are processed in the body differently for pain relief.  -Review the attached handout on narcotic use and their risks and side effects.   Bowel Regimen -You have been prescribed Sennakot-S to take nightly to prevent constipation especially if you are taking the narcotic pain medication intermittently.  It is important to prevent constipation and drink adequate amounts of liquids. You can stop taking this medication when you are not taking pain medication and you are back on your normal bowel routine.  Risks of Surgery Risks of surgery are low but include bleeding, infection, damage to surrounding structures, re-operation, blood clots, and very rarely death.   Blood Transfusion Information (For the consent to be signed before surgery)  We will be checking your blood type before surgery so in case of emergencies, we will know what type of blood you would need.                                            WHAT IS A BLOOD TRANSFUSION?  A transfusion is the replacement of blood or some of its parts. Blood is made up of multiple cells which provide different functions.  Red blood cells carry oxygen and are used for blood loss replacement.  White blood cells fight against infection.  Platelets control bleeding.  Plasma helps clot blood.  Other blood products are available for specialized needs, such as hemophilia or other clotting disorders. BEFORE THE TRANSFUSION  Who gives blood for transfusions?   You may be able to donate blood to be used at a later date on yourself (autologous donation).  Relatives can be asked to donate blood. This is generally not any safer than if you have received blood from a stranger. The same precautions are taken to ensure safety when a relative's blood is  donated.  Healthy volunteers who are fully evaluated to make sure their blood is safe. This is blood bank blood. Transfusion therapy is the safest it has ever been in the practice of medicine. Before blood is taken from a donor, a complete history is taken to make sure that person has no history of diseases nor engages in risky social behavior (examples are intravenous drug use or sexual activity with multiple partners). The donor's travel history is screened to minimize risk of transmitting infections, such as malaria. The donated blood is tested for signs of infectious diseases, such as HIV and hepatitis. The blood is then tested to be sure it is compatible with you in order to minimize the chance of a transfusion reaction. If you or a relative donates blood, this is often done in anticipation of surgery and is not appropriate for emergency situations. It takes many days  to process the donated blood. RISKS AND COMPLICATIONS Although transfusion therapy is very safe and saves many lives, the main dangers of transfusion include:   Getting an infectious disease.  Developing a transfusion reaction. This is an allergic reaction to something in the blood you were given. Every precaution is taken to prevent this. The decision to have a blood transfusion has been considered carefully by your caregiver before blood is given. Blood is not given unless the benefits outweigh the risks.  AFTER SURGERY INSTRUCTIONS  Return to work: 4-6 weeks if applicable  Activity: 1. Be up and out of the bed during the day.  Take a nap if needed.  You may walk up steps but be careful and use the hand rail.  Stair climbing will tire you more than you think, you may need to stop part way and rest.   2. No lifting or straining for 6 weeks over 10 pounds. No pushing, pulling, straining for 6 weeks.  3. No driving for 1 week(s).  Do not drive if you are taking narcotic pain medicine and make sure that your reaction time has  returned.   4. You can shower as soon as the next day after surgery. Shower daily.  Use soap and water on your incision and pat dry; don't rub.  No tub baths or submerging your body in water until cleared by your surgeon. If you have the soap that was given to you by pre-surgical testing that was used before surgery, you do not need to use it afterwards because this can irritate your incisions.   5. No sexual activity and nothing in the vagina for 8 weeks.  6. You may experience a small amount of clear drainage from your incisions, which is normal.  If the drainage persists, increases, or changes color please call the office.  7. Do not use creams, lotions, or ointments such as neosporin on your incisions after surgery until advised by your surgeon because they can cause removal of the dermabond glue on your incisions.    8. You may experience vaginal spotting after surgery or around the 6-8 week mark from surgery when the stitches at the top of the vagina begin to dissolve.  The spotting is normal but if you experience heavy bleeding, call our office.  9. Take Tylenol or ibuprofen first for pain and only use narcotic pain medication for severe pain not relieved by the Tylenol or Ibuprofen.  Monitor your Tylenol intake to a max of 4,000 mg in a 24 hour period. You can alternate these medications after surgery.  Diet: 1. Low sodium Heart Healthy Diet is recommended.  2. It is safe to use a laxative, such as Miralax or Colace, if you have difficulty moving your bowels. You have been prescribed Sennakot at bedtime every evening to keep bowel movements regular and to prevent constipation.    Wound Care: 1. Keep clean and dry.  Shower daily.  Reasons to call the Doctor:  Fever - Oral temperature greater than 100.4 degrees Fahrenheit  Foul-smelling vaginal discharge  Difficulty urinating  Nausea and vomiting  Increased pain at the site of the incision that is unrelieved with pain  medicine.  Difficulty breathing with or without chest pain  New calf pain especially if only on one side  Sudden, continuing increased vaginal bleeding with or without clots.   Contacts: For questions or concerns you should contact:  Dr. Jeral Pinch at [phone]    Joylene John, NP at [phone]  After Hours: call [phone] and have the GYN Oncologist paged/contacted

## 2020-01-13 ENCOUNTER — Telehealth: Payer: Self-pay | Admitting: *Deleted

## 2020-01-13 ENCOUNTER — Encounter: Payer: Self-pay | Admitting: Family

## 2020-01-13 NOTE — Telephone Encounter (Signed)
   Primary Cardiologist: Lauree Chandler, MD  Chart reviewed as part of pre-operative protocol coverage. Patient was contacted 01/13/2020 in reference to pre-operative risk assessment for pending surgery as outlined below.  TOWANA STENGLEIN was last seen on 02/2019 by Dr. Angelena Form.  Since that day, SERENITIE VINTON has done well with no chest pain nor dyspnea. Case reviewed with Dr. Angelena Form as she is without anginal symptoms, no indication for ischemic evaluation.   Therefore, based on ACC/AHA guidelines, the patient would be at acceptable risk for the planned procedure without further cardiovascular testing.   I will route this recommendation to the requesting party via Epic fax function and remove from pre-op pool. Please call with questions.  Loel Dubonnet, NP 01/13/2020, 4:24 PM

## 2020-01-13 NOTE — Telephone Encounter (Signed)
She is smart lady. If she is feeling ok, no need to see her back before her surgery. Gerald Stabs

## 2020-01-13 NOTE — Telephone Encounter (Signed)
Called Miss Bollig regarding her preoperative clearance. She is scheduled for robotic assisted total hysterectomy with bilateral salpingo oophorectomy, possible lymph node dissection, and possible laparatomy on 01/23/20 with Dr. Berline Lopes. This procedure is required for staging of her uterine cancer.   Reports no chest pain, pressure, tightness. Reports no shortness of breath nor dyspnea on exertion. No edema nor orthopnea. She notes some mild fatigue but she attributes this to her recent diagnosis of malignant neoplasm of uterus.   Her exercise tolerance is >4METS.   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 06/2017 echocardiogram EF 60-65%, no RWMA, gr2DD, no significant valvular abnormalities.   Due to the urgent nature of the procedure, will route to Dr. Angelena Form to inquire whether in-office visit is needed prior or if she may proceed. She was last seen 02/2019 and at that time recommended for annual follow up.   Loel Dubonnet, NP

## 2020-01-13 NOTE — Telephone Encounter (Signed)
   [facility] Pre-operative Risk Assessment    HEARTCARE STAFF: - Please ensure there is not already an duplicate clearance open for this procedure. - Under Visit Info/Reason for Call, type in Other and utilize the format Clearance MM/DD/YY or Clearance TBD. Do not use dashes or single digits. - If request is for dental extraction, please clarify the # of teeth to be extracted.  Request for surgical clearance:  1. What type of surgery is being performed? RATLH,BSO,SLN, POSS SND   2. When is this surgery scheduled? 01/23/20   3. What type of clearance is required (medical clearance vs. Pharmacy clearance to hold med vs. Both)? MEDICAL  4. Are there any medications that need to be held prior to surgery and how long? NONE LISTED    5. Practice name and name of physician performing surgery? [facility]; DR. Berline Lopes   6. What is the office phone number? [phone]   7.   What is the office fax number? [phone]  8.   Anesthesia type (None, local, MAC, general) ? GENERAL   [name] [name] 01/13/2020, 2:22 PM  _________________________________________________________________   (provider comments below)

## 2020-01-13 NOTE — Telephone Encounter (Signed)
Fax request to cardiology for surgery clearance

## 2020-01-15 ENCOUNTER — Other Ambulatory Visit: Payer: Self-pay | Admitting: Cardiovascular Disease

## 2020-01-16 ENCOUNTER — Telehealth: Payer: Self-pay

## 2020-01-16 NOTE — Telephone Encounter (Signed)
Ms Krempasky states that her daughter was diagnosed with Covid today prior to her cancer treatment. She brought her daughter to her treatment today and had her hair cut by daughter yesterday. She feels fine. She has had a slight cough the past several days due to post nasal drip. Temp this afternoon is 98.0 She is to get her pre op covid test and labs on Monday 01-20-20. She wanted to know how to proceed. Spoke with Zigmund Daniel in pre surgical testing and she said Konrad Felix PA-C said that since she has had the covid vaccine that she can come to [facility] for labs as long as she is not symptomatic.  If she wakes up Monday and is not feeling well, she needs to cal [facility] pre surgical testing. Pt verbalized understanding.

## 2020-01-20 ENCOUNTER — Encounter (HOSPITAL_COMMUNITY)
Admission: RE | Admit: 2020-01-20 | Discharge: 2020-01-20 | Disposition: A | Discharge status: Home / Self Care | Payer: PPO | Source: Ambulatory Visit | Attending: Gynecologic Oncology | Admitting: Gynecologic Oncology

## 2020-01-20 ENCOUNTER — Other Ambulatory Visit: Payer: Self-pay

## 2020-01-20 ENCOUNTER — Other Ambulatory Visit (HOSPITAL_COMMUNITY)
Admission: RE | Admit: 2020-01-20 | Discharge: 2020-01-20 | Disposition: A | Discharge status: Home / Self Care | Payer: PPO | Source: Ambulatory Visit | Attending: Gynecologic Oncology | Admitting: Gynecologic Oncology

## 2020-01-20 ENCOUNTER — Encounter (HOSPITAL_COMMUNITY): Payer: Self-pay

## 2020-01-20 DIAGNOSIS — U071 COVID-19: Secondary | ICD-10-CM | POA: Insufficient documentation

## 2020-01-20 DIAGNOSIS — Z01812 Encounter for preprocedural laboratory examination: Secondary | ICD-10-CM | POA: Insufficient documentation

## 2020-01-20 LAB — URINALYSIS, COMPLETE (UACMP) WITH MICROSCOPIC
Bacteria, UA: NONE SEEN
Bilirubin Urine: NEGATIVE
Glucose, UA: NEGATIVE mg/dL
Hgb urine dipstick: NEGATIVE
Ketones, ur: 5 mg/dL — AB
Nitrite: NEGATIVE
Protein, ur: NEGATIVE mg/dL
Specific Gravity, Urine: 1.032 — ABNORMAL HIGH (ref 1.005–1.030)
pH: 5 (ref 5.0–8.0)

## 2020-01-20 LAB — COMPREHENSIVE METABOLIC PANEL
ALT: 21 U/L (ref 0–44)
AST: 29 U/L (ref 15–41)
Albumin: 4.1 g/dL (ref 3.5–5.0)
Alkaline Phosphatase: 57 U/L (ref 38–126)
Anion gap: 10 (ref 5–15)
BUN: 17 mg/dL (ref 8–23)
CO2: 31 mmol/L (ref 22–32)
Calcium: 9.3 mg/dL (ref 8.9–10.3)
Chloride: 102 mmol/L (ref 98–111)
Creatinine, Ser: 0.99 mg/dL (ref 0.44–1.00)
GFR calc Af Amer: >60 mL/min (ref >60)
GFR calc non Af Amer: 57 mL/min — ABNORMAL LOW (ref >60)
Glucose, Bld: 139 mg/dL — ABNORMAL HIGH (ref 70–99)
Potassium: 4.2 mmol/L (ref 3.5–5.1)
Sodium: 143 mmol/L (ref 135–145)
Total Bilirubin: 0.8 mg/dL (ref 0.3–1.2)
Total Protein: 7.4 g/dL (ref 6.5–8.1)

## 2020-01-20 LAB — CBC
HCT: 38.2 % (ref 36.0–46.0)
Hemoglobin: 12.5 g/dL (ref 12.0–15.0)
MCH: 30.3 pg (ref 26.0–34.0)
MCHC: 32.7 g/dL (ref 30.0–36.0)
MCV: 92.7 fL (ref 80.0–100.0)
Platelets: 280 10*3/uL (ref 150–400)
RBC: 4.12 MIL/uL (ref 3.87–5.11)
RDW: 13.6 % (ref 11.5–15.5)
WBC: 5 10*3/uL (ref 4.0–10.5)
nRBC: 0 % (ref 0.0–0.2)

## 2020-01-20 LAB — SARS CORONAVIRUS 2 (TAT 6-24 HRS): SARS Coronavirus 2: POSITIVE — AB

## 2020-01-20 LAB — HEMOGLOBIN A1C
Hgb A1c MFr Bld: 6.6 % — ABNORMAL HIGH (ref 4.8–5.6)
Mean Plasma Glucose: 142.72 mg/dL

## 2020-01-20 LAB — GLUCOSE, CAPILLARY: Glucose-Capillary: 137 mg/dL — ABNORMAL HIGH (ref 70–99)

## 2020-01-20 NOTE — Progress Notes (Addendum)
DUE TO COVID-19 ONLY ONE VISITOR IS ALLOWED TO COME WITH YOU AND STAY IN THE WAITING ROOM ONLY DURING PRE OP AND PROCEDURE DAY OF SURGERY. THE 1 VISITOR  MAY VISIT WITH YOU AFTER SURGERY IN YOUR PRIVATE ROOM DURING VISITING HOURS ONLY!  YOU NEED TO HAVE A COVID 19 TEST ON_______ @_______ , THIS TEST MUST BE DONE BEFORE SURGERY,  COVID TESTING SITE [address] [place] [address], IT IS ON THE RIGHT GOING OUT [address] APPROXIMATELY  2 MINUTES PAST [employer] ON THE RIGHT. ONCE YOUR COVID TEST IS COMPLETED,  PLEASE BEGIN THE QUARANTINE INSTRUCTIONS AS OUTLINED IN YOUR HANDOUT.                [name] [name]  01/20/2020   Your procedure is scheduled on:    01/23/2020   Report to [facility] Main  Entrance   Report to admitting at   0900 AM     Call this number if you have problems the morning of surgery [phone]    Remember: Do not eat food    :After Midnight. BRUSH YOUR TEETH MORNING OF SURGERY AND RINSE YOUR MOUTH OUT, NO CHEWING GUM CANDY OR MINTS. Eat a light diet the day  Before surgery.  Avoid gas producing foods. May have clear liquids from 12 midnite until 0800am morning of surgery  then nothing by mouth.      Take these medicines the morning of surgery with A SIP OF WATER:    Coreg, Prozac, Protonix  DO NOT TAKE ANY DIABETIC MEDICATIONS DAY OF YOUR SURGERY                               You may not have any metal on your body including hair pins and              piercings  Do not wear jewelry, make-up, lotions, powders or perfumes, deodorant             Do not wear nail polish on your fingernails.  Do not shave  48 hours prior to surgery.              Men may shave face and neck.   Do not bring valuables to the hospital. [place].  Contacts, dentures or bridgework may not be worn into surgery.  Leave suitcase in the car. After surgery it may be brought to your room.     Patients discharged  the day of surgery will not be allowed to drive home. IF YOU ARE HAVING SURGERY AND GOING HOME THE SAME DAY, YOU MUST HAVE AN ADULT TO DRIVE YOU HOME AND BE WITH YOU FOR 24 HOURS. YOU MAY GO HOME BY TAXI OR UBER OR ORTHERWISE, BUT AN ADULT MUST ACCOMPANY YOU HOME AND STAY WITH YOU FOR 24 HOURS.  Name and phone number of your driver:                Please read over the following fact sheets you were given: _____________________________________________________________________                CLEAR LIQUID DIET   Foods Allowed  Coffee and tea, regular and decaf                          Plain Jell-O any favor except red or purple                                          Fruit ices (not with fruit pulp)                                 Iced Popsicles                                   Carbonated beverages, regular and diet                                    Cranberry, grape and apple juices Sports drinks like Gatorade Lightly seasoned clear broth or consume(fat free) Sugar, honey syrup                                                                                                                     _____________________________________________________________________  [facility] - Preparing for Surgery Before surgery, you can play an important role.  Because skin is not sterile, your skin needs to be as free of germs as possible.  You can reduce the number of germs on your skin by washing with CHG (chlorahexidine gluconate) soap before surgery.  CHG is an antiseptic cleaner which kills germs and bonds with the skin to continue killing germs even after washing. Please DO NOT use if you have an allergy to CHG or antibacterial soaps.  If your skin becomes reddened/irritated stop using the CHG and inform your nurse when you arrive at Short Stay. Do not shave (including legs and underarms) for at least 48 hours prior to the  first CHG shower.  You may shave your face/neck. Please follow these instructions carefully:  1.  Shower with CHG Soap the night before surgery and the  morning of Surgery.  2.  If you choose to wash your hair, wash your hair first as usual with your  normal  shampoo.  3.  After you shampoo, rinse your hair and body thoroughly to remove the  shampoo.                           4.  Use CHG as you would any other liquid soap.  You can apply chg directly  to the skin and wash                       Gently with a scrungie or clean  washcloth.  5.  Apply the CHG Soap to your body ONLY FROM THE NECK DOWN.   Do not use on face/ open                           Wound or open sores. Avoid contact with eyes, ears mouth and genitals (private parts).                       Wash face,  Genitals (private parts) with your normal soap.             6.  Wash thoroughly, paying special attention to the area where your surgery  will be performed.  7.  Thoroughly rinse your body with warm water from the neck down.  8.  DO NOT shower/wash with your normal soap after using and rinsing off  the CHG Soap.                9.  Pat yourself dry with a clean towel.            10.  Wear clean pajamas.            11.  Place clean sheets on your bed the night of your first shower and do not  sleep with pets. Day of Surgery : Do not apply any lotions/deodorants the morning of surgery.  Please wear clean clothes to the hospital/surgery center.  FAILURE TO FOLLOW THESE INSTRUCTIONS MAY RESULT IN THE CANCELLATION OF YOUR SURGERY PATIENT SIGNATURE_________________________________  NURSE SIGNATURE__________________________________  ________________________________________________________________________   Adam Phenix  An incentive spirometer is a tool that can help keep your lungs clear and active. This tool measures how well you are filling your lungs with each breath. Taking long deep breaths may help reverse or decrease  the chance of developing breathing (pulmonary) problems (especially infection) following:  A long period of time when you are unable to move or be active. BEFORE THE PROCEDURE   If the spirometer includes an indicator to show your best effort, your nurse or respiratory therapist will set it to a desired goal.  If possible, sit up straight or lean slightly forward. Try not to slouch.  Hold the incentive spirometer in an upright position. INSTRUCTIONS FOR USE  1. Sit on the edge of your bed if possible, or sit up as far as you can in bed or on a chair. 2. Hold the incentive spirometer in an upright position. 3. Breathe out normally. 4. Place the mouthpiece in your mouth and seal your lips tightly around it. 5. Breathe in slowly and as deeply as possible, raising the piston or the ball toward the top of the column. 6. Hold your breath for 3-5 seconds or for as long as possible. Allow the piston or ball to fall to the bottom of the column. 7. Remove the mouthpiece from your mouth and breathe out normally. 8. Rest for a few seconds and repeat Steps 1 through 7 at least 10 times every 1-2 hours when you are awake. Take your time and take a few normal breaths between deep breaths. 9. The spirometer may include an indicator to show your best effort. Use the indicator as a goal to work toward during each repetition. 10. After each set of 10 deep breaths, practice coughing to be sure your lungs are clear. If you have an incision (the cut made at the time of surgery), support your incision when coughing by  placing a pillow or rolled up towels firmly against it. Once you are able to get out of bed, walk around indoors and cough well. You may stop using the incentive spirometer when instructed by your caregiver.  RISKS AND COMPLICATIONS  Take your time so you do not get dizzy or light-headed.  If you are in pain, you may need to take or ask for pain medication before doing incentive spirometry. It is  harder to take a deep breath if you are having pain. AFTER USE  Rest and breathe slowly and easily.  It can be helpful to keep track of a log of your progress. Your caregiver can provide you with a simple table to help with this. If you are using the spirometer at home, follow these instructions: [place] IF:   You are having difficultly using the spirometer.  You have trouble using the spirometer as often as instructed.  Your pain medication is not giving enough relief while using the spirometer.  You develop fever of 100.5 F (38.1 C) or higher. SEEK IMMEDIATE MEDICAL CARE IF:   You cough up bloody sputum that had not been present before.  You develop fever of 102 F (38.9 C) or greater.  You develop worsening pain at or near the incision site. MAKE SURE YOU:   Understand these instructions.  Will watch your condition.  Will get help right away if you are not doing well or get worse. Document Released: 10/10/2006 Document Revised: 08/22/2011 Document Reviewed: 12/11/2006 [facility] Patient Information 2014 [place], [place].   ________________________________________________________________________

## 2020-01-20 NOTE — Progress Notes (Signed)
Anesthesia Review:  PCP: [name]  Cardiologist : [place]  Clearance 01/13/2020 by Hoy Morn ( card) in epic  Chest x-ray : EKG :02/22/19  Echo :2019-epic  Stress test: Cardiac Cath :  Activity level: can do a flight of steps without difficulty  Sleep Study/ CPAP : Fasting Blood Sugar :      / Checks Blood Sugar -- times a day:   Blood Thinner/ Instructions /Last Dose: ASA / Instructions/ Last Dose :  Plavix- last dose per prior approx 3 days prior to D and C on 01/03/20.  Per Dr Jeral Pinch per pt - continue on 6m ASA

## 2020-01-20 NOTE — Progress Notes (Signed)
U/A done 01/20/20 faxed via epic to DR Jeral Pinch and Joylene John, NP./

## 2020-01-20 NOTE — Progress Notes (Signed)
Patient did verify that she has been vaccinated .

## 2020-01-20 NOTE — Progress Notes (Signed)
Reviewed medical history and preop instructgions via phone with patient.  Patient voiced understanding. Patient will receive cop yof preop instructions at time of lap appt on 01/20/20.

## 2020-01-20 NOTE — Progress Notes (Signed)
Refer to Telephone Encounter on 01/16/2020.  Spoke with patient via phone on 01/20/2020 at 1030am.  Patient verified she has no covid symptoms and was around her daughter on 01/15/2020 who is positive for covid.  .  Patient does report a post nasal drip which she reports she always has.  Patient denies any covid symptoms.  History and instructions completed via phone.  Patient to have covid test done on 01/20/2020 at 100pm then will be coming in for labs at @[facility]  at 200pm on 01/20/2020.  Konrad Felix, PAC made aware of above.  No new orders given. Myrtie Soman and Naida Sleight who work in lab made aware of above by secure chat message.

## 2020-01-21 ENCOUNTER — Encounter: Payer: Self-pay | Admitting: Oncology

## 2020-01-21 ENCOUNTER — Telehealth: Payer: Self-pay | Admitting: Gynecologic Oncology

## 2020-01-21 ENCOUNTER — Other Ambulatory Visit (HOSPITAL_COMMUNITY): Payer: Self-pay | Admitting: Oncology

## 2020-01-21 DIAGNOSIS — U071 COVID-19: Secondary | ICD-10-CM

## 2020-01-21 MED ORDER — SODIUM CHLORIDE 0.9 % IV SOLN
1200.0000 mg | Freq: Once | INTRAVENOUS | Status: AC
  Administered 2020-01-22: 1200 mg via INTRAVENOUS
  Filled 2020-01-21: qty 1200

## 2020-01-21 NOTE — Progress Notes (Signed)
I connected by phone with  Mrs. Fraga on 01/21/2020 at 11:15am to discuss the potential use of an new treatment for mild to moderate COVID-19 viral infection in non-hospitalized patients.   This patient is a age/sex that meets the FDA criteria for Emergency Use Authorization of casirivimab\imdevimab.  Has a (+) direct SARS-CoV-2 viral test result 1. Has mild or moderate COVID-19  2. Is ? 74 years of age and weighs ? 40 kg 3. Is NOT hospitalized due to COVID-19 4. Is NOT requiring oxygen therapy or requiring an increase in baseline oxygen flow rate due to COVID-19 5. Is within 10 days of symptom onset 6. Has at least one of the high risk factor(s) for progression to severe COVID-19 and/or hospitalization as defined in EUA. ? Specific high risk criteria :Age, Cancer    Symptom onset  01/18/2020.    I have spoken and communicated the following to the patient or parent/caregiver:   1. FDA has authorized the emergency use of casirivimab\imdevimab for the treatment of mild to moderate COVID-19 in adults and pediatric patients with positive results of direct SARS-CoV-2 viral testing who are 47 years of age and older weighing at least 40 kg, and who are at high risk for progressing to severe COVID-19 and/or hospitalization.   2. The significant known and potential risks and benefits of casirivimab\imdevimab, and the extent to which such potential risks and benefits are unknown.   3. Information on available alternative treatments and the risks and benefits of those alternatives, including clinical trials.   4. Patients treated with casirivimab\imdevimab should continue to self-isolate and use infection control measures (e.g., wear mask, isolate, social distance, avoid sharing personal items, clean and disinfect "high touch" surfaces, and frequent handwashing) according to CDC guidelines.    5. The patient or parent/caregiver has the option to accept or refuse casirivimab\imdevimab .   After  reviewing this information with the patient, The patient agreed to proceed with receiving casirivimab\imdevimab infusion and will be provided a copy of the Fact sheet prior to receiving the infusion.Rulon Abide, AGNP-C [phone] ([place])

## 2020-01-21 NOTE — Telephone Encounter (Signed)
Patient returned call to the office.  Informed her of positive COVID results.  She states for the past three days, she has had congestion, sinus issues, and a cough. Denies fever. Advised her that her surgery would need to be postponed and we would contact her with a new surgery date. She is concerned about having to wait. Advised her I would call her back with a new surgery date.

## 2020-01-21 NOTE — Telephone Encounter (Signed)
Called patient to discuss preop COVID results.  Husband answered the phone and states pt is still sleeping.  Will plan to call back later this am.

## 2020-01-21 NOTE — Telephone Encounter (Signed)
Returned call to patient.  Advised her of her new surgery date of February 04, 2020 as long as her symptoms improve and do not worsen. She has an appt tomorrow at the [facility].  Advised to call for any needs or concerns.

## 2020-01-21 NOTE — Telephone Encounter (Signed)
Called to follow up with patient.  She states she is on the other line with the COVID infusion center.  Advised I would call back.

## 2020-01-21 NOTE — Progress Notes (Signed)
Covid screening resuolts faxed via epic to Dr Berline Lopes and Zoila Shutter.

## 2020-01-22 ENCOUNTER — Ambulatory Visit (HOSPITAL_COMMUNITY)
Admission: RE | Admit: 2020-01-22 | Discharge: 2020-01-22 | Disposition: A | Discharge status: Home / Self Care | Payer: Medicare Other | Source: Ambulatory Visit | Attending: Pulmonary Disease | Admitting: Pulmonary Disease

## 2020-01-22 DIAGNOSIS — R54 Age-related physical debility: Secondary | ICD-10-CM | POA: Insufficient documentation

## 2020-01-22 DIAGNOSIS — Z23 Encounter for immunization: Secondary | ICD-10-CM | POA: Insufficient documentation

## 2020-01-22 DIAGNOSIS — C801 Malignant (primary) neoplasm, unspecified: Secondary | ICD-10-CM | POA: Diagnosis not present

## 2020-01-22 DIAGNOSIS — U071 COVID-19: Secondary | ICD-10-CM | POA: Insufficient documentation

## 2020-01-22 MED ORDER — ALBUTEROL SULFATE HFA 108 (90 BASE) MCG/ACT IN AERS: 2.0000 | INHALATION_SPRAY | Freq: Once | RESPIRATORY_TRACT | Status: DC | PRN

## 2020-01-22 MED ORDER — DIPHENHYDRAMINE HCL 50 MG/ML IJ SOLN: 50.0000 mg | Freq: Once | INTRAMUSCULAR | Status: DC | PRN

## 2020-01-22 MED ORDER — EPINEPHRINE 0.3 MG/0.3ML IJ SOAJ: 0.3000 mg | Freq: Once | INTRAMUSCULAR | Status: DC | PRN

## 2020-01-22 MED ORDER — FAMOTIDINE IN NACL 20-0.9 MG/50ML-% IV SOLN: 20.0000 mg | Freq: Once | INTRAVENOUS | Status: DC | PRN

## 2020-01-22 MED ORDER — METHYLPREDNISOLONE SODIUM SUCC 125 MG IJ SOLR: 125.0000 mg | Freq: Once | INTRAMUSCULAR | Status: DC | PRN

## 2020-01-22 MED ORDER — SODIUM CHLORIDE 0.9 % IV SOLN: INTRAVENOUS | Status: DC | PRN

## 2020-01-22 NOTE — Discharge Instructions (Signed)

## 2020-01-22 NOTE — Progress Notes (Signed)
  Diagnosis: COVID-19  Physician:Dr Joya Gaskins  Procedure: Covid Infusion Clinic Med: casirivimab\imdevimab infusion - Provided patient with casirivimab\imdevimab fact sheet for patients, parents and caregivers prior to infusion.  Complications: No immediate complications noted.  Discharge: Discharged home   [place], [place] 01/22/2020

## 2020-01-23 ENCOUNTER — Other Ambulatory Visit: Payer: Self-pay | Admitting: Cardiovascular Disease

## 2020-01-23 LAB — TYPE AND SCREEN
ABO/RH(D): A POS
Antibody Screen: NEGATIVE

## 2020-01-25 ENCOUNTER — Other Ambulatory Visit: Payer: Self-pay | Admitting: Cardiovascular Disease

## 2020-01-28 ENCOUNTER — Ambulatory Visit: Payer: PPO | Admitting: Gynecologic Oncology

## 2020-01-30 ENCOUNTER — Encounter (HOSPITAL_COMMUNITY): Payer: Self-pay | Admitting: Gynecologic Oncology

## 2020-01-30 ENCOUNTER — Telehealth: Payer: Self-pay

## 2020-01-30 NOTE — Progress Notes (Signed)
COVID Vaccine Completed: Date COVID Vaccine completed: COVID vaccine manufacturer: [place]   PCP - [name], MD Cardiologist - Lauree Chandler, MD w/ cardiac clearance from Loel Dubonnet, NP dated 01/13/20  Chest x-ray -  EKG - 9/11/120 Stress Test -  ECHO - 06-29-17 Cardiac Cath -   Sleep Study -  CPAP -  HGA1C 01/20/20 Epic 6.6  Fasting Blood Sugar - 120-133  Checks Blood Sugar __daily   Blood Thinner Instructions: Plavix 75 mg Last dose 01/03/20, per Dr. Marvel Plan, Ob/Gyn  Aspirin Instructions: 81 mg Okay to continue per Dr. Marvel Plan, OB/GYN Last Dose:  Anesthesia review: Hx of HTN, CAD, Unstable Angia, CM  Patient denies shortness of breath, fever, cough and chest pain at PAT appointment   Patient verbalized understanding of instructions that were given to them at the PAT appointment. Patient was also instructed that they will need to review over the PAT instructions again at home before surgery.

## 2020-01-30 NOTE — Telephone Encounter (Signed)
TC to patient to follow up on covid symptoms.  Patient states she still has occasional cough but sinus congestion/drainage has resolved.  Patient states she is taking her temperature is has remained afebrile.  Patient informed her husband will not be able to enter hospital 2/2 to his covid status.  Patient verbalized understanding of all information provided and has alternate caregiver for day of surgery.

## 2020-01-30 NOTE — Progress Notes (Signed)
Anesthesia Chart Review   Case: [id number] Date/Time: 02/04/20 0715   Procedures:      XI ROBOTIC ASSISTED TOTAL HYSTERECTOMY WITH BILATERAL SALPINGO OOPHORECTOMY, POSSIBLE LYMPH NODE DISSECTION,POSSIBLE LAPAROTOMY (Bilateral )     SENTINEL NODE BIOPSY (N/A )   Anesthesia type: General   Pre-op diagnosis: UTERINE CANCER   Location[facility] RO[facility] 03 / [facility]   Surgeons: Lafonda Mosses, MD      DISCUSSION:74 y.o. former smoker (quit 10/04/94) with h/o HTN, GERD, HLD, CAD, DM II, uterine cancer scheduled for above procedure 02/04/2020 with Dr. Jeral Pinch.   Pt tested positive for COVID 01/20/2020, surgery postponed.    Per cardiology preoperative risk assessment 01/13/2020, "Chart reviewed as part of pre-operative protocol coverage. Patient was contacted 01/13/2020 in reference to pre-operative risk assessment for pending surgery as outlined below.  [name] [name] was last seen on 02/2019 by Dr. Angelena Form.  Since that day, [name] [name] has done well with no chest pain nor dyspnea. Case reviewed with Dr. Angelena Form as she is without anginal symptoms, no indication for ischemic evaluation.  Therefore, based on ACC/AHA guidelines, the patient would be at acceptable risk for the planned procedure without further cardiovascular testing."  Anticipate pt can proceed with planned procedure barring acute status change and as long as asymptomatic from recent COVID diagnosis.    VS: BP (!) 113/54   Pulse 88   Temp 36.7 C (Oral)   Resp 16   Ht 5' 6"  (1.676 m)   Wt 76.7 kg   SpO2 97%   BMI 27.28 kg/m   PROVIDERS: [name], MD is PCP  Lauree Chandler, MD is Cardiology LABS: forwarded to surgeon (all labs ordered are listed, but only abnormal results are displayed)  Labs Reviewed  COMPREHENSIVE METABOLIC PANEL - Abnormal; Notable for the following components:      Result Value   Glucose, Bld 139 (*)    GFR calc non Af Amer 57 (*)    All other components within normal limits   URINALYSIS, COMPLETE (UACMP) WITH MICROSCOPIC - Abnormal; Notable for the following components:   Color, Urine AMBER (*)    Specific Gravity, Urine 1.032 (*)    Ketones, ur 5 (*)    Leukocytes,Ua MODERATE (*)    All other components within normal limits  HEMOGLOBIN A1C - Abnormal; Notable for the following components:   Hgb A1c MFr Bld 6.6 (*)    All other components within normal limits  GLUCOSE, CAPILLARY - Abnormal; Notable for the following components:   Glucose-Capillary 137 (*)    All other components within normal limits  CBC  TYPE AND SCREEN     IMAGES:   EKG: 02/22/2019 Rate 78 bpm  NSR Septal infarct, age undetermined   CV: Echo 06/29/2017 Study Conclusions   - Left ventricle: The cavity size was normal. Wall thickness was  normal. Systolic function was normal. The estimated ejection  fraction was in the range of 60% to 65%. Wall motion was normal;  there were no regional wall motion abnormalities. Features are  consistent with a pseudonormal left ventricular filling pattern,  with concomitant abnormal relaxation and increased filling  pressure (grade 2 diastolic dysfunction).  Past Medical History:  Diagnosis Date  . Adenomatous colon polyp 11/2005  . Anginal pain ([place])    last time 2016  . Anxiety   . Arthritis   . CAD (coronary artery disease)    2V CABG 1996 (LIMA to LAD, SVG to RCA) Dr. Servando Snare.; last  stress test in 2010  . Diabetes mellitus without complication ([place])    type II  . GERD (gastroesophageal reflux disease)   . Hyperlipidemia   . Hypertension   . Muscle cramps    and pains  . Myocardial infarction ([place])    1996  . Pneumonia    hx of x 2   . Ulcerative colitis   . Uterine cancer [name])     Past Surgical History:  Procedure Laterality Date  . BACK SURGERY     lumbar fusion 25 years ago and ruptured disk 10 years ago same area  . BACK SURGERY     ruptered disc under lumbar disc  . CARDIAC CATHETERIZATION N/A  10/13/2014   Procedure: Left Heart Cath And Coronary Angiography;  Surgeon: Burnell Blanks, MD;  Location: [facility]UPID;  Service: Cardiovascular;  Laterality: N/A;  . CARDIAC CATHETERIZATION N/A 10/13/2014   Procedure: Coronary Stent Intervention;  Surgeon: Burnell Blanks, MD;  Location: [facility] INVASIVE CV LAB CUPID;  Service: Cardiovascular;  Laterality: N/A;  . COLONOSCOPY    . CORONARY ANGIOPLASTY WITH STENT PLACEMENT  10/13/2014   DES to [place]  . CORONARY ARTERY BYPASS GRAFT  1996   x 2  . DILATATION & CURETTAGE/HYSTEROSCOPY WITH MYOSURE N/A 01/03/2020   Procedure: DILATATION & CURETTAGE/HYSTEROSCOPY WITH POSSIBLE MYOSURE;  Surgeon: Paula Compton, MD;  Location: [facility];  Service: Gynecology;  Laterality: N/A;  . DILATION AND EVACUATION N/A 01/03/2020   Procedure: DILATATION AND EVACUATION;  Surgeon: Paula Compton, MD;  Location: [facility];  Service: Gynecology;  Laterality: N/A;  . OPERATIVE ULTRASOUND N/A 01/03/2020   Procedure: OPERATIVE ULTRASOUND;  Surgeon: Paula Compton, MD;  Location: [facility];  Service: Gynecology;  Laterality: N/A;  . POLYPECTOMY    . UPPER GASTROINTESTINAL ENDOSCOPY      MEDICATIONS: . clopidogrel (PLAVIX) 75 MG tablet  . aspirin EC 81 MG tablet  . balsalazide (COLAZAL) 750 MG capsule  . calcium carbonate (CALCIUM 600) 600 MG TABS tablet  . carvedilol (COREG) 3.125 MG tablet  . Cholecalciferol (VITAMIN D) 50 MCG (2000 UT) tablet  . docusate sodium (COLACE) 100 MG capsule  . FLUoxetine (PROZAC) 10 MG capsule  . ibuprofen (ADVIL) 600 MG tablet  . LORazepam (ATIVAN) 0.5 MG tablet  . losartan-hydrochlorothiazide (HYZAAR) 100-12.5 MG tablet  . metFORMIN (GLUCOPHAGE-XR) 500 MG 24 hr tablet  . Multiple Vitamins-Minerals (MULTIVITAMIN WITH MINERALS) tablet  . nitroGLYCERIN (NITROSTAT) 0.4 MG SL tablet  . oxyCODONE (OXY IR/ROXICODONE) 5 MG immediate release tablet  . pantoprazole (PROTONIX) 40 MG tablet  . senna-docusate  (SENOKOT-S) 8.6-50 MG tablet  . simvastatin (ZOCOR) 40 MG tablet   No current facility-administered medications for this encounter.    Konrad Felix, PA-C [facility] [phone]

## 2020-01-30 NOTE — Progress Notes (Signed)
Pt tested positive for COVID 19 on 01/20/20  and received Infusion 120. Pt now reports that husband tested positive for COVID yesterday 01/29/20.Pt reports that she is asymptomatic with exception of being hoarse. She was concerned that her surgery with Dr. Berline Lopes may have to be rescheduled again.   Joylene John, NP made aware. Melissa stated that she would a nurse from their practice, contact the patient.

## 2020-02-03 ENCOUNTER — Telehealth: Payer: Self-pay | Admitting: *Deleted

## 2020-02-03 NOTE — Telephone Encounter (Signed)
Spoke with Ms. Yeary regarding surgery scheduled for tomorrow. Pt states that she is feeling well, has picked up her prescriptions and does not have any questions about her surgery tomorrow. Pt states that she will arrive at 5:30am tomorrow.

## 2020-02-03 NOTE — Anesthesia Preprocedure Evaluation (Addendum)
Anesthesia Evaluation  Patient identified by MRN, date of birth, ID band Patient awake    Reviewed: Allergy & Precautions, NPO status , Patient's Chart, lab work & pertinent test results  History of Anesthesia Complications Negative for: history of anesthetic complications  Airway Mallampati: II  TM Distance: >3 FB Neck ROM: Full    Dental  (+) Dental Advisory Given, Teeth Intact   Pulmonary former smoker,    Pulmonary exam normal        Cardiovascular hypertension, Pt. on home beta blockers and Pt. on medications + CAD, + Past MI and + CABG  Normal cardiovascular exam     Neuro/Psych PSYCHIATRIC DISORDERS Anxiety negative neurological ROS     GI/Hepatic Neg liver ROS, PUD, GERD  Medicated and Controlled, UC    Endo/Other  diabetes, Type 2, Oral Hypoglycemic Agents  Renal/GU negative Renal ROS     Musculoskeletal  (+) Arthritis ,   Abdominal   Peds  Hematology  On plavix    Anesthesia Other Findings Covid+ 01/20/20, mild cough  Reproductive/Obstetrics  Uterine cancer                             Anesthesia Physical Anesthesia Plan  ASA: III  Anesthesia Plan: General   Post-op Pain Management:    Induction: Intravenous  PONV Risk Score and Plan: 4 or greater and Treatment may vary due to age or medical condition, Ondansetron and Dexamethasone  Airway Management Planned: Oral ETT  Additional Equipment: None  Intra-op Plan:   Post-operative Plan: Extubation in OR  Informed Consent: I have reviewed the patients History and Physical, chart, labs and discussed the procedure including the risks, benefits and alternatives for the proposed anesthesia with the patient or authorized representative who has indicated his/her understanding and acceptance.     Dental advisory given  Plan Discussed with: CRNA and Anesthesiologist  Anesthesia Plan Comments:        Anesthesia  Quick Evaluation

## 2020-02-04 ENCOUNTER — Encounter (HOSPITAL_COMMUNITY)
Admission: RE | Disposition: A | Discharge status: Home / Self Care | Payer: Self-pay | Source: Ambulatory Visit | Attending: Gynecologic Oncology

## 2020-02-04 ENCOUNTER — Other Ambulatory Visit: Payer: Self-pay

## 2020-02-04 ENCOUNTER — Encounter (HOSPITAL_COMMUNITY): Payer: Self-pay | Admitting: Gynecologic Oncology

## 2020-02-04 ENCOUNTER — Ambulatory Visit (HOSPITAL_COMMUNITY)
Admission: RE | Admit: 2020-02-04 | Discharge: 2020-02-04 | Disposition: A | Discharge status: Home / Self Care | Payer: PPO | Source: Ambulatory Visit | Attending: Gynecologic Oncology | Admitting: Gynecologic Oncology

## 2020-02-04 ENCOUNTER — Ambulatory Visit (HOSPITAL_COMMUNITY): Payer: PPO | Admitting: Certified Registered"

## 2020-02-04 ENCOUNTER — Ambulatory Visit (HOSPITAL_COMMUNITY): Payer: PPO | Admitting: Physician Assistant

## 2020-02-04 DIAGNOSIS — M1990 Unspecified osteoarthritis, unspecified site: Secondary | ICD-10-CM | POA: Diagnosis not present

## 2020-02-04 DIAGNOSIS — Z951 Presence of aortocoronary bypass graft: Secondary | ICD-10-CM | POA: Insufficient documentation

## 2020-02-04 DIAGNOSIS — F419 Anxiety disorder, unspecified: Secondary | ICD-10-CM | POA: Insufficient documentation

## 2020-02-04 DIAGNOSIS — Z87891 Personal history of nicotine dependence: Secondary | ICD-10-CM | POA: Diagnosis not present

## 2020-02-04 DIAGNOSIS — Z8249 Family history of ischemic heart disease and other diseases of the circulatory system: Secondary | ICD-10-CM | POA: Insufficient documentation

## 2020-02-04 DIAGNOSIS — K589 Irritable bowel syndrome without diarrhea: Secondary | ICD-10-CM | POA: Insufficient documentation

## 2020-02-04 DIAGNOSIS — E119 Type 2 diabetes mellitus without complications: Secondary | ICD-10-CM | POA: Diagnosis not present

## 2020-02-04 DIAGNOSIS — C55 Malignant neoplasm of uterus, part unspecified: Secondary | ICD-10-CM | POA: Diagnosis not present

## 2020-02-04 DIAGNOSIS — Z8616 Personal history of COVID-19: Secondary | ICD-10-CM | POA: Insufficient documentation

## 2020-02-04 DIAGNOSIS — Z7984 Long term (current) use of oral hypoglycemic drugs: Secondary | ICD-10-CM | POA: Diagnosis not present

## 2020-02-04 DIAGNOSIS — E785 Hyperlipidemia, unspecified: Secondary | ICD-10-CM | POA: Diagnosis not present

## 2020-02-04 DIAGNOSIS — K219 Gastro-esophageal reflux disease without esophagitis: Secondary | ICD-10-CM | POA: Diagnosis not present

## 2020-02-04 DIAGNOSIS — C541 Malignant neoplasm of endometrium: Secondary | ICD-10-CM | POA: Diagnosis not present

## 2020-02-04 DIAGNOSIS — C542 Malignant neoplasm of myometrium: Secondary | ICD-10-CM | POA: Insufficient documentation

## 2020-02-04 DIAGNOSIS — I252 Old myocardial infarction: Secondary | ICD-10-CM | POA: Diagnosis not present

## 2020-02-04 DIAGNOSIS — I2510 Atherosclerotic heart disease of native coronary artery without angina pectoris: Secondary | ICD-10-CM | POA: Insufficient documentation

## 2020-02-04 DIAGNOSIS — I10 Essential (primary) hypertension: Secondary | ICD-10-CM | POA: Diagnosis not present

## 2020-02-04 DIAGNOSIS — Z7982 Long term (current) use of aspirin: Secondary | ICD-10-CM | POA: Insufficient documentation

## 2020-02-04 DIAGNOSIS — Z955 Presence of coronary angioplasty implant and graft: Secondary | ICD-10-CM | POA: Insufficient documentation

## 2020-02-04 DIAGNOSIS — Z833 Family history of diabetes mellitus: Secondary | ICD-10-CM | POA: Insufficient documentation

## 2020-02-04 DIAGNOSIS — Z79899 Other long term (current) drug therapy: Secondary | ICD-10-CM | POA: Insufficient documentation

## 2020-02-04 DIAGNOSIS — I25110 Atherosclerotic heart disease of native coronary artery with unstable angina pectoris: Secondary | ICD-10-CM | POA: Diagnosis not present

## 2020-02-04 HISTORY — PX: ROBOTIC ASSISTED TOTAL HYSTERECTOMY WITH BILATERAL SALPINGO OOPHERECTOMY: SHX6086

## 2020-02-04 HISTORY — PX: SENTINEL NODE BIOPSY: SHX6608

## 2020-02-04 LAB — BASIC METABOLIC PANEL
Anion gap: 10 (ref 5–15)
BUN: 9 mg/dL (ref 8–23)
CO2: 24 mmol/L (ref 22–32)
Calcium: 8.7 mg/dL — ABNORMAL LOW (ref 8.9–10.3)
Chloride: 103 mmol/L (ref 98–111)
Creatinine, Ser: 0.8 mg/dL (ref 0.44–1.00)
GFR calc Af Amer: >60 mL/min (ref >60)
GFR calc non Af Amer: >60 mL/min (ref >60)
Glucose, Bld: 136 mg/dL — ABNORMAL HIGH (ref 70–99)
Potassium: 3.3 mmol/L — ABNORMAL LOW (ref 3.5–5.1)
Sodium: 137 mmol/L (ref 135–145)

## 2020-02-04 LAB — GLUCOSE, CAPILLARY
Glucose-Capillary: 136 mg/dL — ABNORMAL HIGH (ref 70–99)
Glucose-Capillary: 168 mg/dL — ABNORMAL HIGH (ref 70–99)

## 2020-02-04 LAB — URINALYSIS, ROUTINE W REFLEX MICROSCOPIC
Bilirubin Urine: NEGATIVE
Glucose, UA: NEGATIVE mg/dL
Hgb urine dipstick: NEGATIVE
Ketones, ur: NEGATIVE mg/dL
Nitrite: NEGATIVE
Protein, ur: 30 mg/dL — AB
Specific Gravity, Urine: 1.028 (ref 1.005–1.030)
pH: 5 (ref 5.0–8.0)

## 2020-02-04 LAB — CBC
HCT: 34.4 % — ABNORMAL LOW (ref 36.0–46.0)
Hemoglobin: 11.4 g/dL — ABNORMAL LOW (ref 12.0–15.0)
MCH: 30.8 pg (ref 26.0–34.0)
MCHC: 33.1 g/dL (ref 30.0–36.0)
MCV: 93 fL (ref 80.0–100.0)
Platelets: 256 10*3/uL (ref 150–400)
RBC: 3.7 MIL/uL — ABNORMAL LOW (ref 3.87–5.11)
RDW: 14.2 % (ref 11.5–15.5)
WBC: 5 10*3/uL (ref 4.0–10.5)
nRBC: 0 % (ref 0.0–0.2)

## 2020-02-04 LAB — HEMOGLOBIN A1C
Hgb A1c MFr Bld: 6.5 % — ABNORMAL HIGH (ref 4.8–5.6)
Mean Plasma Glucose: 140 mg/dL

## 2020-02-04 LAB — TYPE AND SCREEN
ABO/RH(D): A POS
Antibody Screen: NEGATIVE

## 2020-02-04 SURGERY — HYSTERECTOMY, TOTAL, ROBOT-ASSISTED, LAPAROSCOPIC, WITH BILATERAL SALPINGO-OOPHORECTOMY
Anesthesia: General

## 2020-02-04 MED ORDER — EPHEDRINE SULFATE-NACL 50-0.9 MG/10ML-% IV SOSY
PREFILLED_SYRINGE | INTRAVENOUS | Status: DC | PRN
  Administered 2020-02-04 (×2): 10 mg via INTRAVENOUS
  Administered 2020-02-04: 5 mg via INTRAVENOUS

## 2020-02-04 MED ORDER — SODIUM CHLORIDE 0.9% FLUSH: 3.0000 mL | Freq: Two times a day (BID) | INTRAVENOUS | Status: DC

## 2020-02-04 MED ORDER — OXYCODONE HCL 5 MG/5ML PO SOLN: 5.0000 mg | Freq: Once | ORAL | Status: DC | PRN

## 2020-02-04 MED ORDER — EPHEDRINE 5 MG/ML INJ
INTRAVENOUS | Status: AC
  Filled 2020-02-04: qty 10

## 2020-02-04 MED ORDER — OXYCODONE HCL 5 MG PO TABS: 5.0000 mg | ORAL_TABLET | Freq: Once | ORAL | Status: DC | PRN

## 2020-02-04 MED ORDER — PHENYLEPHRINE HCL (PRESSORS) 10 MG/ML IV SOLN
INTRAVENOUS | Status: AC
  Filled 2020-02-04: qty 1

## 2020-02-04 MED ORDER — DEXAMETHASONE SODIUM PHOSPHATE 4 MG/ML IJ SOLN: 4.0000 mg | INTRAMUSCULAR | Status: DC

## 2020-02-04 MED ORDER — LIDOCAINE 2% (20 MG/ML) 5 ML SYRINGE
INTRAMUSCULAR | Status: DC | PRN
  Administered 2020-02-04: 50 mg via INTRAVENOUS

## 2020-02-04 MED ORDER — ROCURONIUM BROMIDE 10 MG/ML (PF) SYRINGE
PREFILLED_SYRINGE | INTRAVENOUS | Status: AC
  Filled 2020-02-04: qty 10

## 2020-02-04 MED ORDER — CHLORHEXIDINE GLUCONATE 0.12 % MT SOLN
15.0000 mL | Freq: Once | OROMUCOSAL | Status: AC
  Administered 2020-02-04: 15 mL via OROMUCOSAL

## 2020-02-04 MED ORDER — LIDOCAINE HCL 2 % IJ SOLN
INTRAMUSCULAR | Status: AC
  Filled 2020-02-04: qty 20

## 2020-02-04 MED ORDER — LIDOCAINE 2% (20 MG/ML) 5 ML SYRINGE
INTRAMUSCULAR | Status: AC
  Filled 2020-02-04: qty 5

## 2020-02-04 MED ORDER — ROCURONIUM BROMIDE 10 MG/ML (PF) SYRINGE
PREFILLED_SYRINGE | INTRAVENOUS | Status: DC | PRN
  Administered 2020-02-04: 10 mg via INTRAVENOUS
  Administered 2020-02-04: 40 mg via INTRAVENOUS
  Administered 2020-02-04 (×2): 10 mg via INTRAVENOUS

## 2020-02-04 MED ORDER — BUPIVACAINE HCL 0.25 % IJ SOLN
INTRAMUSCULAR | Status: AC
  Filled 2020-02-04: qty 1

## 2020-02-04 MED ORDER — ONDANSETRON HCL 4 MG/2ML IJ SOLN
INTRAMUSCULAR | Status: DC | PRN
  Administered 2020-02-04: 4 mg via INTRAVENOUS

## 2020-02-04 MED ORDER — FENTANYL CITRATE (PF) 100 MCG/2ML IJ SOLN
INTRAMUSCULAR | Status: AC
  Administered 2020-02-04: 25 ug via INTRAVENOUS
  Filled 2020-02-04: qty 2

## 2020-02-04 MED ORDER — ORAL CARE MOUTH RINSE: 15.0000 mL | Freq: Once | OROMUCOSAL | Status: AC

## 2020-02-04 MED ORDER — GABAPENTIN 100 MG PO CAPS
200.0000 mg | ORAL_CAPSULE | ORAL | Status: AC
  Administered 2020-02-04: 200 mg via ORAL
  Filled 2020-02-04: qty 2

## 2020-02-04 MED ORDER — STERILE WATER FOR INJECTION IJ SOLN
INTRAMUSCULAR | Status: DC | PRN
  Administered 2020-02-04: 10 mL

## 2020-02-04 MED ORDER — SUCCINYLCHOLINE CHLORIDE 200 MG/10ML IV SOSY
PREFILLED_SYRINGE | INTRAVENOUS | Status: DC | PRN
  Administered 2020-02-04: 120 mg via INTRAVENOUS

## 2020-02-04 MED ORDER — CEFAZOLIN SODIUM-DEXTROSE 2-4 GM/100ML-% IV SOLN
2.0000 g | INTRAVENOUS | Status: AC
  Administered 2020-02-04: 2 g via INTRAVENOUS
  Filled 2020-02-04: qty 100

## 2020-02-04 MED ORDER — FENTANYL CITRATE (PF) 100 MCG/2ML IJ SOLN
25.0000 ug | INTRAMUSCULAR | Status: DC | PRN
  Administered 2020-02-04: 25 ug via INTRAVENOUS
  Administered 2020-02-04: 50 ug via INTRAVENOUS

## 2020-02-04 MED ORDER — ONDANSETRON HCL 4 MG/2ML IJ SOLN
INTRAMUSCULAR | Status: AC
  Filled 2020-02-04: qty 2

## 2020-02-04 MED ORDER — FENTANYL CITRATE (PF) 100 MCG/2ML IJ SOLN
INTRAMUSCULAR | Status: AC
  Filled 2020-02-04: qty 2

## 2020-02-04 MED ORDER — FENTANYL CITRATE (PF) 250 MCG/5ML IJ SOLN
INTRAMUSCULAR | Status: DC | PRN
Start: 2020-02-04 — End: 2020-02-04
  Administered 2020-02-04: 25 ug via INTRAVENOUS
  Administered 2020-02-04: 100 ug via INTRAVENOUS

## 2020-02-04 MED ORDER — SUGAMMADEX SODIUM 200 MG/2ML IV SOLN
INTRAVENOUS | Status: DC | PRN
  Administered 2020-02-04: 170 mg via INTRAVENOUS

## 2020-02-04 MED ORDER — BUPIVACAINE HCL 0.25 % IJ SOLN
INTRAMUSCULAR | Status: DC | PRN
  Administered 2020-02-04: 30 mL

## 2020-02-04 MED ORDER — GLYCOPYRROLATE PF 0.2 MG/ML IJ SOSY
PREFILLED_SYRINGE | INTRAMUSCULAR | Status: AC
  Filled 2020-02-04: qty 1

## 2020-02-04 MED ORDER — HEPARIN SODIUM (PORCINE) 5000 UNIT/ML IJ SOLN
5000.0000 [IU] | INTRAMUSCULAR | Status: AC
  Administered 2020-02-04: 5000 [IU] via SUBCUTANEOUS
  Filled 2020-02-04: qty 1

## 2020-02-04 MED ORDER — LACTATED RINGERS IV SOLN: INTRAVENOUS | Status: DC

## 2020-02-04 MED ORDER — DEXAMETHASONE SODIUM PHOSPHATE 10 MG/ML IJ SOLN
INTRAMUSCULAR | Status: DC | PRN
  Administered 2020-02-04: 8 mg via INTRAVENOUS

## 2020-02-04 MED ORDER — DEXAMETHASONE SODIUM PHOSPHATE 10 MG/ML IJ SOLN
INTRAMUSCULAR | Status: AC
  Filled 2020-02-04: qty 1

## 2020-02-04 MED ORDER — ONDANSETRON HCL 4 MG/2ML IJ SOLN: 4.0000 mg | Freq: Once | INTRAMUSCULAR | Status: DC | PRN

## 2020-02-04 MED ORDER — PROPOFOL 10 MG/ML IV BOLUS
INTRAVENOUS | Status: AC
  Filled 2020-02-04: qty 40

## 2020-02-04 MED ORDER — SUCCINYLCHOLINE CHLORIDE 200 MG/10ML IV SOSY
PREFILLED_SYRINGE | INTRAVENOUS | Status: AC
  Filled 2020-02-04: qty 10

## 2020-02-04 MED ORDER — STERILE WATER FOR IRRIGATION IR SOLN
Status: DC | PRN
  Administered 2020-02-04: 1000 mL

## 2020-02-04 MED ORDER — STERILE WATER FOR INJECTION IJ SOLN
INTRAMUSCULAR | Status: AC
  Filled 2020-02-04: qty 10

## 2020-02-04 MED ORDER — PROPOFOL 10 MG/ML IV BOLUS
INTRAVENOUS | Status: DC | PRN
  Administered 2020-02-04: 150 mg via INTRAVENOUS

## 2020-02-04 MED ORDER — LIDOCAINE 20MG/ML (2%) 15 ML SYRINGE OPTIME
INTRAMUSCULAR | Status: DC | PRN
  Administered 2020-02-04: 1.5 mg/kg/h via INTRAVENOUS

## 2020-02-04 MED ORDER — LACTATED RINGERS IR SOLN
Status: DC | PRN
  Administered 2020-02-04: 1000 mL

## 2020-02-04 MED ORDER — ACETAMINOPHEN 500 MG PO TABS
1000.0000 mg | ORAL_TABLET | ORAL | Status: AC
  Administered 2020-02-04: 1000 mg via ORAL
  Filled 2020-02-04: qty 2

## 2020-02-04 SURGICAL SUPPLY — 70 items
ADH SKN CLS APL DERMABOND .7 (GAUZE/BANDAGES/DRESSINGS) ×2
AGENT HMST KT MTR STRL THRMB (HEMOSTASIS)
APL ESCP 34 STRL LF DISP (HEMOSTASIS)
APL SRG 38 LTWT LNG FL B (MISCELLANEOUS) ×2
APPLICATOR ARISTA FLEXITIP XL (MISCELLANEOUS) ×3 IMPLANT
APPLICATOR SURGIFLO ENDO (HEMOSTASIS) IMPLANT
BACTOSHIELD CHG 4% 4OZ (MISCELLANEOUS) ×1
BAG LAPAROSCOPIC 12 15 PORT 16 (BASKET) IMPLANT
BAG RETRIEVAL 12/15 (BASKET)
BAG SPEC RTRVL LRG 6X4 10 (ENDOMECHANICALS) ×2
BLADE SURG SZ10 CARB STEEL (BLADE) IMPLANT
COVER BACK TABLE 60X90IN (DRAPES) ×3 IMPLANT
COVER TIP SHEARS 8 DVNC (MISCELLANEOUS) ×2 IMPLANT
COVER TIP SHEARS 8MM DA VINCI (MISCELLANEOUS) ×3
COVER WAND RF STERILE (DRAPES) IMPLANT
DECANTER SPIKE VIAL GLASS SM (MISCELLANEOUS) IMPLANT
DERMABOND ADVANCED (GAUZE/BANDAGES/DRESSINGS) ×1
DERMABOND ADVANCED .7 DNX12 (GAUZE/BANDAGES/DRESSINGS) ×2 IMPLANT
DRAPE ARM DVNC X/XI (DISPOSABLE) ×8 IMPLANT
DRAPE COLUMN DVNC XI (DISPOSABLE) ×2 IMPLANT
DRAPE DA VINCI XI ARM (DISPOSABLE) ×12
DRAPE DA VINCI XI COLUMN (DISPOSABLE) ×3
DRAPE SHEET LG 3/4 BI-LAMINATE (DRAPES) ×3 IMPLANT
DRAPE SURG IRRIG POUCH 19X23 (DRAPES) ×3 IMPLANT
DRSG OPSITE POSTOP 4X6 (GAUZE/BANDAGES/DRESSINGS) IMPLANT
DRSG OPSITE POSTOP 4X8 (GAUZE/BANDAGES/DRESSINGS) IMPLANT
ELECT REM PT RETURN 15FT ADLT (MISCELLANEOUS) ×3 IMPLANT
GLOVE BIO SURGEON STRL SZ 6 (GLOVE) ×12 IMPLANT
GLOVE BIO SURGEON STRL SZ 6.5 (GLOVE) ×6 IMPLANT
GOWN STRL REUS W/ TWL LRG LVL3 (GOWN DISPOSABLE) ×8 IMPLANT
GOWN STRL REUS W/TWL LRG LVL3 (GOWN DISPOSABLE) ×12
HEMOSTAT ARISTA ABSORB 3G PWDR (HEMOSTASIS) ×3 IMPLANT
HOLDER FOLEY CATH W/STRAP (MISCELLANEOUS) ×3 IMPLANT
IRRIG SUCT STRYKERFLOW 2 WTIP (MISCELLANEOUS) ×3
IRRIGATION SUCT STRKRFLW 2 WTP (MISCELLANEOUS) ×2 IMPLANT
KIT PROCEDURE DA VINCI SI (MISCELLANEOUS) ×3
KIT PROCEDURE DVNC SI (MISCELLANEOUS) ×2 IMPLANT
KIT TURNOVER KIT A (KITS) IMPLANT
MANIPULATOR UTERINE 4.5 ZUMI (MISCELLANEOUS) ×3 IMPLANT
NEEDLE HYPO 21X1.5 SAFETY (NEEDLE) ×3 IMPLANT
NEEDLE SPNL 18GX3.5 QUINCKE PK (NEEDLE) ×3 IMPLANT
OBTURATOR OPTICAL STANDARD 8MM (TROCAR) ×3
OBTURATOR OPTICAL STND 8 DVNC (TROCAR) ×2
OBTURATOR OPTICALSTD 8 DVNC (TROCAR) ×2 IMPLANT
PACK ROBOT GYN CUSTOM WL (TRAY / TRAY PROCEDURE) ×3 IMPLANT
PAD POSITIONING PINK XL (MISCELLANEOUS) ×3 IMPLANT
PENCIL SMOKE EVACUATOR (MISCELLANEOUS) IMPLANT
PORT ACCESS TROCAR AIRSEAL 12 (TROCAR) ×2 IMPLANT
PORT ACCESS TROCAR AIRSEAL 5M (TROCAR) ×1
POUCH SPECIMEN RETRIEVAL 10MM (ENDOMECHANICALS) ×3 IMPLANT
SCRUB CHG 4% DYNA-HEX 4OZ (MISCELLANEOUS) ×2 IMPLANT
SEAL CANN UNIV 5-8 DVNC XI (MISCELLANEOUS) ×8 IMPLANT
SEAL XI 5MM-8MM UNIVERSAL (MISCELLANEOUS) ×12
SET TRI-LUMEN FLTR TB AIRSEAL (TUBING) ×3 IMPLANT
SPONGE LAP 18X18 RF (DISPOSABLE) IMPLANT
SURGIFLO W/THROMBIN 8M KIT (HEMOSTASIS) IMPLANT
SUT MNCRL AB 4-0 PS2 18 (SUTURE) IMPLANT
SUT PDS AB 1 TP1 96 (SUTURE) IMPLANT
SUT VIC AB 0 CT1 27 (SUTURE)
SUT VIC AB 0 CT1 27XBRD ANTBC (SUTURE) IMPLANT
SUT VIC AB 2-0 CT1 27 (SUTURE)
SUT VIC AB 2-0 CT1 TAPERPNT 27 (SUTURE) IMPLANT
SUT VICRYL 4-0 PS2 18IN ABS (SUTURE) ×6 IMPLANT
SYR 10ML LL (SYRINGE) ×3 IMPLANT
TOWEL OR NON WOVEN STRL DISP B (DISPOSABLE) ×3 IMPLANT
TRAP SPECIMEN MUCUS 40CC (MISCELLANEOUS) IMPLANT
TRAY FOLEY MTR SLVR 16FR STAT (SET/KITS/TRAYS/PACK) ×3 IMPLANT
TROCAR XCEL NON-BLD 5MMX100MML (ENDOMECHANICALS) IMPLANT
UNDERPAD 30X36 HEAVY ABSORB (UNDERPADS AND DIAPERS) ×3 IMPLANT
WATER STERILE IRR 1000ML POUR (IV SOLUTION) ×3 IMPLANT

## 2020-02-04 NOTE — Op Note (Signed)
OPERATIVE NOTE  Pre-operative Diagnosis: endometrial cancer, high grade  Post-operative Diagnosis: same  Operation: Robotic-assisted laparoscopic total hysterectomy with bilateral salpingoophorectomy, SLN biopsy, left pelvic LND   Surgeon: Jeral Pinch MD  Assistant Surgeon: Lahoma Crocker MD (an MD assistant was necessary for tissue manipulation, management of robotic instrumentation, retraction and positioning due to the complexity of the case and hospital policies).   Anesthesia: GET  Urine Output: see I&O flowsheet  Operative Findings:  On EUA, 10cm mobile uterus. ON intra-abdominal entry, normal upper abdominal survey. Normal appearing omentum, small and large bowel. Uterus 10cm and bulbous. Tortuous and hyperemic ovarian vasculature. Some tubal metaplasia vs. Metastatic disease. Normal appearing ovaries. Mapping successful to deep right obturator SLN and presacral SLN. NO mapping on left. Enlarge deep obturator lymph on the left. Given high grade histology, short small bowel mesentery, and recent COVID infection, decision made not to proceed with left PA LND (would have likely required open procedure. NO intra-abdominal or pelvic evidence of disease.   Estimated Blood Loss:  less than 100 mL      Total IV Fluids: see I&O flowsheet         Specimens: uterus, cervix, bilateral tubes and ovaries, right obturator SLN, presacral SLN, left pelvic LNs         Complications:  None apparent; patient tolerated the procedure well.         Disposition: PACU - hemodynamically stable.  Procedure Details  The patient was seen in the Holding Room. The risks, benefits, complications, treatment options, and expected outcomes were discussed with the patient.  The patient concurred with the proposed plan, giving informed consent.  The site of surgery properly noted/marked. The patient was identified as [name] [name] and the procedure verified as a Robotic-assisted hysterectomy with bilateral  salpingo oophorectomy with SLN biopsy.   After induction of anesthesia, the patient was draped and prepped in the usual sterile manner. Patient was placed in supine position after anesthesia and draped and prepped in the usual sterile manner as follows: Her arms were tucked to her side with all appropriate precautions.  The shoulders were stabilized with padded shoulder blocks applied to the acromium processes.  The patient was placed in the semi-lithotomy position in [place].  The perineum and vagina were prepped with CholoraPrep. The patient was draped after the CholoraPrep had been allowed to dry for 3 minutes.  A Time Out was held and the above information confirmed.  The urethra was prepped with Betadine. Foley catheter was placed.  A sterile speculum was placed in the vagina.  The cervix was grasped with a single-tooth tenaculum. 67m total of ICG was injected into the cervical stroma at 2 and 9 o'clock with 1cc injected at a 1cm and 242mdepth (concentration 0.46m60ml) in all locations. The cervix was dilated with PraKennon Roundslators.  The ZUMI uterine manipulator with a medium colpotomizer ring was placed without difficulty.  A pneum occluder balloon was placed over the manipulator.  OG tube placement was confirmed and to suction.   Next, a 10 mm skin incision was made 1 cm below the subcostal margin in the midclavicular line.  The 5 mm Optiview port and scope was used for direct entry.  Opening pressure was under 10 mm CO2.  The abdomen was insufflated and the findings were noted as above.   At this point and all points during the procedure, the patient's intra-abdominal pressure did not exceed 15 mmHg. Next, an 8 mm skin incision was made superior  to the umbilicus and a right and left port were placed about 8 cm lateral to the robot port on the right and left side.  A fourth arm was placed on the right.  The 5 mm assist trocar was exchanged for a 10-12 mm port. All ports were placed under direct  visualization.  The patient was placed in steep Trendelenburg.  Bowel was folded away into the upper abdomen.  The robot was docked in the normal manner.  Physiologic filmy adhesions of the cecum to the right lateral abdominal wall were lysed sharply.  The right and left peritoneum were opened parallel to the IP ligament to open the retroperitoneal spaces bilaterally. The round ligaments were transected. The SLN mapping was performed in bilateral pelvic basins. After identifying the ureters, the para rectal and paravesical spaces were opened up entirely with careful dissection below the level of the ureters bilaterally and to the depth of the uterine artery origin in order to skeletonize the uterine "web" and ensure visualization of all parametrial channels. The para-aortic basins were carefully exposed and evaluated for isolated para-aortic SLN's. Lymphatic channels were identified travelling to the following visualized sentinel lymph node's: right obturator and right presacral. These SLN's were separated from their surrounding lymphatic tissue, removed and sent for permanent pathology. No mapping was noted on the left.   A pelvic lymph dissection was performed on the left with the following borders: proximally the bifurcation of the common iliac, distally the circumflex iliac vein, laterally the genitofemoral nerve, the medial border was the superior vesicle artery and the deep border was the obturator nerve. All lymphatic tissue was removed and sent to Pathology.   The hysterectomy was started.  The ureter was again noted to be on the medial leaf of the broad ligament.  The peritoneum above the ureter was incised and stretched and the infundibulopelvic ligament was skeletonized, cauterized and cut.  The posterior peritoneum was taken down to the level of the KOH ring.  The anterior peritoneum was also taken down.  The bladder flap was created to the level of the KOH ring.  The uterine artery on the right  side was skeletonized, cauterized and cut in the normal manner.  A similar procedure was performed on the left.  The colpotomy was made and the uterus, cervix, bilateral ovaries and tubes were amputated and delivered through the vagina.  Pedicles were inspected and excellent hemostasis was achieved.    The colpotomy at the vaginal cuff was closed with Vicryl on a CT1 needle in running manner.  Irrigation was used and excellent hemostasis was achieved.  At this point in the procedure was completed.  Robotic instruments were removed under direct visulaization.  The robot was undocked. The fascia at the 10-12 mm port was closed with 0 Vicryl on a UR-5 needle.  The subcuticular tissue was closed with 4-0 Vicryl and the skin was closed with 4-0 Monocryl in a subcuticular manner.  Dermabond was applied.    The vagina was swabbed with  minimal bleeding noted.   All sponge, lap and needle counts were correct x  3. Foley catheter was removed.  The patient was transferred to the recovery room in stable condition.  Jeral Pinch, MD

## 2020-02-04 NOTE — Interval H&P Note (Signed)
History and Physical Interval Note:  02/04/2020 7:24 AM  [name] [name]  has presented today for surgery, with the diagnosis of UTERINE CANCER.  The various methods of treatment have been discussed with the patient and family. After consideration of risks, benefits and other options for treatment, the patient has consented to  Procedure(s): XI ROBOTIC ASSISTED TOTAL HYSTERECTOMY WITH BILATERAL SALPINGO OOPHORECTOMY, POSSIBLE LYMPH NODE DISSECTION,POSSIBLE LAPAROTOMY (Bilateral) SENTINEL NODE BIOPSY (N/A) as a surgical intervention.  The patient's history has been reviewed, patient examined, no change in status, stable for surgery.  I have reviewed the patient's chart and labs.  Questions were answered to the patient's satisfaction.     [name] [name]

## 2020-02-04 NOTE — Anesthesia Procedure Notes (Signed)
Procedure Name: Intubation Date/Time: 02/04/2020 8:42 AM Performed by: Eben Burow, CRNA Pre-anesthesia Checklist: Patient identified, Emergency Drugs available, Suction available, Patient being monitored and Timeout performed Patient Re-evaluated:Patient Re-evaluated prior to induction Oxygen Delivery Method: Circle system utilized Preoxygenation: Pre-oxygenation with 100% oxygen Induction Type: Rapid sequence and IV induction Laryngoscope Size: Mac and 4 Grade View: Grade I Tube type: Oral Tube size: 7.5 mm Number of attempts: 1 Airway Equipment and Method: Stylet Placement Confirmation: ETT inserted through vocal cords under direct vision,  positive ETCO2 and breath sounds checked- equal and bilateral Secured at: 22 cm Tube secured with: Tape Dental Injury: Teeth and Oropharynx as per pre-operative assessment

## 2020-02-04 NOTE — Transfer of Care (Signed)
Immediate Anesthesia Transfer of Care Note  Patient: [name] [name]  Procedure(s) Performed: XI ROBOTIC ASSISTED TOTAL HYSTERECTOMY WITH BILATERAL SALPINGO OOPHORECTOMY, LYMPH NODE DISSECTION (Bilateral ) SENTINEL NODE BIOPSY (N/A )  Patient Location: PACU  Anesthesia Type:General  Level of Consciousness: awake, drowsy and patient cooperative  Airway & Oxygen Therapy: Patient Spontanous Breathing and Patient connected to face mask oxygen  Post-op Assessment: Report given to RN and Post -op Vital signs reviewed and stable  Post vital signs: Reviewed and stable  Last Vitals:  Vitals Value Taken Time  BP    Temp    Pulse    Resp    SpO2      Last Pain:  Vitals:   02/04/20 0624  TempSrc: Oral         Complications: No complications documented.

## 2020-02-04 NOTE — Discharge Instructions (Signed)
02/04/2020  Activity: 1. Be up and out of the bed during the day.  Take a nap if needed.  You may walk up steps but be careful and use the hand rail.  Stair climbing will tire you more than you think, you may need to stop part way and rest.   2. No lifting or straining for 6 weeks.  3. No driving for 1-2 weeks.  Do Not drive if you are taking narcotic pain medicine and you can brake safely.  4. Shower daily.  Use soap and water on your incision and pat dry; don't rub.   5. No sexual activity and nothing in the vagina for 8 weeks.  Medications:  - Take ibuprofen and tylenol first line for pain control. Take these regularly (every 6 hours) to decrease the build up of pain.  - If necessary, for severe pain not relieved by ibuprofen, take oxycodone.  - While taking percocet you should take sennakot every night to reduce the likelihood of constipation. If this causes diarrhea, stop its use.  Diet: 1. Low sodium Heart Healthy Diet is recommended.  2. It is safe to use a laxative if you have difficulty moving your bowels.   Wound Care: 1. Keep clean and dry.  Shower daily.  Reasons to call the Doctor:   Fever - Oral temperature greater than 100.4 degrees Fahrenheit  Foul-smelling vaginal discharge  Difficulty urinating  Nausea and vomiting  Increased pain at the site of the incision that is unrelieved with pain medicine.  Difficulty breathing with or without chest pain  New calf pain especially if only on one side  Sudden, continuing increased vaginal bleeding with or without clots.   Follow-up: 1. See Jeral Pinch in 3 weeks.  Contacts: For questions or concerns you should contact:  Dr. Jeral Pinch at [phone] After hours and on week-ends call [phone] and ask to speak to the physician on call for Gynecologic Oncology   After Your Surgery  The information in this section will tell you what to expect after your surgery, both during your stay and  after you leave. You will learn how to safely recover from your surgery. Write down any questions you have and be sure to ask your doctor or nurse.  What to Expect When you wake up after your surgery, you will be in the [facility] (PACU) or your recovery room. A nurse will be monitoring your body temperature, blood pressure, pulse, and oxygen levels. You may have a urinary catheter in your bladder to help monitor the amount of urine you are making. It should come out before you go home. You will also have compression boots on your lower legs to help your circulation. Your pain medication will be given through an IV line or in tablet form. If you are having pain, tell your nurse. Your nurse will tell you how to recover from your surgery. Below are examples of ways you can help yourself recover safely. . You will be encouraged to walk with the help of your nurse or physical therapist. We will give you medication to relieve pain. Walking helps reduce the risk for blood clots and pneumonia. It also helps to stimulate your bowels so they begin working again. . Use your incentive spirometer. This will help your lungs expand, which prevents pneumonia.   Commonly Asked Questions  Will I have pain after surgery? Yes, you will have some pain after your surgery, especially in the first few days. Your  doctor and nurse will ask you about your pain often. You will be given medication to manage your pain as needed. If your pain is not relieved, please tell your doctor or nurse. It is important to control your pain so you can cough, breathe deeply, use your incentive spirometer, and get out of bed and walk.  Will I be able to eat? Yes, you will be able to eat a regular diet or eat as tolerated. You should start with foods that are soft and easy to digest such as apple sauce and chicken noodle soup. Eat small meals frequently, and then advance to regular foods. If you experience bloating, gas, or  cramps, limit high-fiber foods, including whole grain breads and cereal, nuts, seeds, salads, fresh fruit, broccoli, cabbage, and cauliflower. Will I have pain when I am home? The length of time each person has pain or discomfort varies. You may still have some pain when you go home and will probably be taking pain medication. Follow the guidelines below. . Take your medications as directed and as needed. . Call your doctor if the medication prescribed for you doesn't relieve your pain. . Don't drive or drink alcohol while you're taking prescription pain medication. . As your incision heals, you will have less pain and need less pain medication. A mild pain reliever such as acetaminophen (Tylenol) or ibuprofen (Advil) will relieve aches and discomfort. However, large quantities of acetaminophen may be harmful to your liver. Don't take more acetaminophen than the amount directed on the bottle or as instructed by your doctor or nurse. . Pain medication should help you as you resume your normal activities. Take enough medication to do your exercises comfortably. Pain medication is most effective 30 to 45 minutes after taking it. Marland Kitchen Keep track of when you take your pain medication. Taking it when your pain first begins is more effective than waiting for the pain to get worse. Pain medication may cause constipation (having fewer bowel movements than what is normal for you).  How can I prevent constipation? . Go to the bathroom at the same time every day. Your body will get used to going at that time. . If you feel the urge to go, don't put it off. Try to use the bathroom 5 to 15 minutes after meals. . After breakfast is a good time to move your bowels. The reflexes in your colon are strongest at this time. . Exercise, if you can. Walking is an excellent form of exercise. . Drink 8 (8-ounce) glasses (2 liters) of liquids daily, if you can. Drink water, juices, soups, ice cream shakes, and other drinks  that don't have caffeine. Drinks with caffeine, such as coffee and soda, pull fluid out of the body. . Slowly increase the fiber in your diet to 25 to 35 grams per day. Fruits, vegetables, whole grains, and cereals contain fiber. If you have an ostomy or have had recent bowel surgery, check with your doctor or nurse before making any changes in your diet. . Both over-the-counter and prescription medications are available to treat constipation. Start with 1 of the following over-the-counter medications first: o Docusate sodium (Colace) 100 mg. Take ___1__ capsules _2____ times a day. This is a stool softener that causes few side effects. Don't take it with mineral oil. o Polyethylene glycol (MiraLAX) 17 grams daily. o Senna (Senokot) 2 tablets at bedtime. This is a stimulant laxative, which can cause cramping. . If you haven't had a bowel movement in 2 days,  call your doctor or nurse.  Can I shower? Yes, you should shower 24 hours after your surgery. Be sure to shower every day. Taking a warm shower is relaxing and can help decrease muscle aches. Use soap when you shower and gently wash your incision. Pat the areas dry with a towel after showering, and leave your incision uncovered (unless there is drainage). Call your doctor if you see any redness or drainage from your incision. Don't take tub baths until you discuss it with your doctor at the first appointment after your surgery. How do I care for my incisions? You will have several small incisions on your abdomen. The incisions are closed with Steri-Strips or Dermabond. You may also have square white dressings on your incisions (Primapore). You can remove these in the shower 24 hours after your surgery. You should clean your incisions with soap and water. If you go home with Steri-Strips on your incision, they will loosen and may fall off by themselves. If they haven't fallen off within 10 days, you can remove them. If you go home with Dermabond  over your sutures (stitches), it will also loosen and peel off.  What are the most common symptoms after a hysterectomy? It's common for you to have some vaginal spotting or light bleeding. You should monitor this with a pad or a panty liner. If you have having heavy bleeding (bleeding through a pad or liner every 1 to 2 hours), call your doctor right away. It's also common to have some discomfort after surgery from the air that was pumped into your abdomen during surgery. To help with this, walk, drink plenty of liquids and make sure to take the stool softeners you received.  When is it safe for me to drive? You may resume driving 2 weeks after surgery, as long as you are not taking pain medication that may make you drowsy.  When can I resume sexual activity? Do not place anything in your vagina or have vaginal intercourse for 8 weeks after your surgery. Some people will need to wait longer than 8 weeks, so speak with your doctor before resuming sexual intercourse.  Will I be able to travel? Yes, you can travel. If you are traveling by plane within a few weeks after your surgery, make sure you get up and walk every hour. Be sure to stretch your legs, drink plenty of liquids, and keep your feet elevated when possible.  Will I need any supplies? Most people do not need any supplies after the surgery. In the rare case that you do need supplies, such as tubes or drains, your nurse will order them for you.  When can I return to work? The time it takes to return to work depends on the type of work you do, the type of surgery you had, and how fast your body heals. Most people can return to work about 2 to 4 weeks after the surgery.  What exercises can I do? Exercise will help you gain strength and feel better. Walking and stair climbing are excellent forms of exercise. Gradually increase the distance you walk. Climb stairs slowly, resting or stopping as needed. Ask your doctor or nurse before  starting more strenuous exercises.  When can I lift heavy objects? Most people should not lift anything heavier than 10 pounds (4.5 kilograms) for at least 4 weeks after surgery. Speak with your doctor about when you can do heavy lifting.  How can I cope with my feelings? After surgery for  a serious illness, you may have new and upsetting feelings. Many people say they felt weepy, sad, worried, nervous, irritable, and angry at one time or another. You may find that you can't control some of these feelings. If this happens, it's a good idea to seek emotional support. The first step in coping is to talk about how you feel. Family and friends can help. Your nurse, doctor, and social worker can reassure, support, and guide you. It's always a good idea to let these professionals know how you, your family, and your friends are feeling emotionally. Many resources are available to patients and their families. Whether you're in the hospital or at home, the nurses, doctors, and social workers are here to help you and your family and friends handle the emotional aspects of your illness.  When is my first appointment after surgery? Your first appointment after surgery will be 2 to 4 weeks after surgery. Your nurse will give you instructions on how to make this appointment, including the phone number to call.  What if I have other questions? If you have any questions or concerns, please talk with your doctor or nurse. You can reach them Monday through Friday from 9:00 am to 5:00 pm. After 5:00 pm, during the weekend, and on holidays, call [phone] and ask for the doctor on call for your doctor.  . Have a temperature of 101 F (38.3 C) or higher . Have pain that does not get better with pain medication . Have redness, drainage, or swelling from your incisions

## 2020-02-04 NOTE — Anesthesia Postprocedure Evaluation (Signed)
Anesthesia Post Note  Patient: [name] [name]  Procedure(s) Performed: XI ROBOTIC ASSISTED TOTAL HYSTERECTOMY WITH BILATERAL SALPINGO OOPHORECTOMY, LYMPH NODE DISSECTION (Bilateral ) SENTINEL NODE BIOPSY (N/A )     Patient location during evaluation: PACU Anesthesia Type: General Level of consciousness: awake and alert Pain management: pain level controlled Vital Signs Assessment: post-procedure vital signs reviewed and stable Respiratory status: spontaneous breathing, nonlabored ventilation and respiratory function stable Cardiovascular status: blood pressure returned to baseline and stable Postop Assessment: no apparent nausea or vomiting Anesthetic complications: no   No complications documented.  Last Vitals:  Vitals:   02/04/20 1115 02/04/20 1130  BP: (!) 162/73 (!) 159/68  Pulse: 66 74  Resp: 13 14  Temp:  36.5 C  SpO2: 94% 93%    Last Pain:  Vitals:   02/04/20 1130  TempSrc:   PainSc: 0-No pain                 Audry Pili

## 2020-02-05 ENCOUNTER — Encounter (HOSPITAL_COMMUNITY): Payer: Self-pay | Admitting: Gynecologic Oncology

## 2020-02-05 ENCOUNTER — Telehealth: Payer: Self-pay

## 2020-02-05 NOTE — Telephone Encounter (Addendum)
[name] [name] states that she is eating, drinking, and urinating well. She is passing gas. No BM. Suggested that she increase the senokot-s to 2 tabs bid until BM. Afebrile. Incisions are D&I. Pt aware of post op appointments and the office number to call if she has any questions or concerns.

## 2020-02-06 ENCOUNTER — Other Ambulatory Visit: Payer: Self-pay

## 2020-02-11 ENCOUNTER — Telehealth: Payer: Self-pay | Admitting: Gynecologic Oncology

## 2020-02-11 NOTE — Telephone Encounter (Signed)
Left patient a voicemail message to verify telephone visit for pre reg

## 2020-02-12 ENCOUNTER — Encounter: Payer: Self-pay | Admitting: Gynecologic Oncology

## 2020-02-12 ENCOUNTER — Inpatient Hospital Stay: Payer: PPO | Attending: Gynecologic Oncology | Admitting: Gynecologic Oncology

## 2020-02-12 DIAGNOSIS — Z90722 Acquired absence of ovaries, bilateral: Secondary | ICD-10-CM

## 2020-02-12 DIAGNOSIS — Z23 Encounter for immunization: Secondary | ICD-10-CM | POA: Insufficient documentation

## 2020-02-12 DIAGNOSIS — I10 Essential (primary) hypertension: Secondary | ICD-10-CM | POA: Insufficient documentation

## 2020-02-12 DIAGNOSIS — Z7902 Long term (current) use of antithrombotics/antiplatelets: Secondary | ICD-10-CM | POA: Insufficient documentation

## 2020-02-12 DIAGNOSIS — Z8616 Personal history of COVID-19: Secondary | ICD-10-CM | POA: Insufficient documentation

## 2020-02-12 DIAGNOSIS — C55 Malignant neoplasm of uterus, part unspecified: Secondary | ICD-10-CM | POA: Insufficient documentation

## 2020-02-12 DIAGNOSIS — R519 Headache, unspecified: Secondary | ICD-10-CM | POA: Insufficient documentation

## 2020-02-12 DIAGNOSIS — Z87891 Personal history of nicotine dependence: Secondary | ICD-10-CM | POA: Insufficient documentation

## 2020-02-12 DIAGNOSIS — M1990 Unspecified osteoarthritis, unspecified site: Secondary | ICD-10-CM | POA: Insufficient documentation

## 2020-02-12 DIAGNOSIS — F329 Major depressive disorder, single episode, unspecified: Secondary | ICD-10-CM | POA: Insufficient documentation

## 2020-02-12 DIAGNOSIS — Z7982 Long term (current) use of aspirin: Secondary | ICD-10-CM | POA: Insufficient documentation

## 2020-02-12 DIAGNOSIS — I2510 Atherosclerotic heart disease of native coronary artery without angina pectoris: Secondary | ICD-10-CM | POA: Insufficient documentation

## 2020-02-12 DIAGNOSIS — Z79899 Other long term (current) drug therapy: Secondary | ICD-10-CM | POA: Insufficient documentation

## 2020-02-12 DIAGNOSIS — I252 Old myocardial infarction: Secondary | ICD-10-CM | POA: Insufficient documentation

## 2020-02-12 DIAGNOSIS — F419 Anxiety disorder, unspecified: Secondary | ICD-10-CM | POA: Insufficient documentation

## 2020-02-12 DIAGNOSIS — E785 Hyperlipidemia, unspecified: Secondary | ICD-10-CM | POA: Insufficient documentation

## 2020-02-12 DIAGNOSIS — I209 Angina pectoris, unspecified: Secondary | ICD-10-CM | POA: Insufficient documentation

## 2020-02-12 DIAGNOSIS — E119 Type 2 diabetes mellitus without complications: Secondary | ICD-10-CM | POA: Insufficient documentation

## 2020-02-12 DIAGNOSIS — Z90710 Acquired absence of both cervix and uterus: Secondary | ICD-10-CM | POA: Insufficient documentation

## 2020-02-12 DIAGNOSIS — K219 Gastro-esophageal reflux disease without esophagitis: Secondary | ICD-10-CM | POA: Insufficient documentation

## 2020-02-12 DIAGNOSIS — Z951 Presence of aortocoronary bypass graft: Secondary | ICD-10-CM | POA: Insufficient documentation

## 2020-02-12 DIAGNOSIS — Z7984 Long term (current) use of oral hypoglycemic drugs: Secondary | ICD-10-CM | POA: Insufficient documentation

## 2020-02-12 DIAGNOSIS — R5383 Other fatigue: Secondary | ICD-10-CM | POA: Insufficient documentation

## 2020-02-12 DIAGNOSIS — R42 Dizziness and giddiness: Secondary | ICD-10-CM | POA: Insufficient documentation

## 2020-02-12 LAB — SURGICAL PATHOLOGY

## 2020-02-12 NOTE — Progress Notes (Signed)
Gynecologic Oncology Telehealth Consult Note: Gyn-Onc  I connected with [name] [name] on 02/12/20 at  3:15 PM EDT by telephone and verified that I am speaking with the correct person using two identifiers.  I discussed the limitations, risks, security and privacy concerns of performing an evaluation and management service by telemedicine and the availability of in-person appointments. I also discussed with the patient that there may be a patient responsible charge related to this service. The patient expressed understanding and agreed to proceed.  Other persons participating in the visit and their role in the encounter: None.  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 Severe distension of the endometrium highly suspicious for cervical obstruction or stenosis possibly due to a neoplastic process in this postmenopausal female with history of vaginal bleeding. Further evaluation with hysteroscopy is recommended. 2. Diarrheal state. Correlation with clinical exam and stool cultures recommended. No bowel obstruction. Normal appendix.   01/03/2020 Surgery   D&C A. ENDOMETRIUM, CURETTAGE:  -  Poorly differentiated malignancy  -  See comment  COMMENT:  The material consists of a poorly differentiated malignancy with  extensive necrosis.  By immunohistochemistry, p16 is positive and there  is focal cytokeratin AE1/3 positivity but negative for cytokeratin 7,  cytokeratin 5/6, CD20, CD3, p53, WT-1, Pax-8, and S100. The differential  includes poorly differentiated carcinoma and high-grade stromal sarcoma.  Dr. Saralyn Pilar reviewed the case and agrees with the above diagnosis.  Dr.  Marvel Plan was notified of these results on January 08, 2020.    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 ON intra-abdominal entry, normal upper abdominal survey. Normal appearing omentum, small and large bowel. Uterus 10cm and bulbous. Tortuous and hyperemic ovarian vasculature. Some tubal metaplasia vs. Metastatic disease. Normal appearing ovaries. Mapping successful to deep right obturator SLN and presacral SLN. NO mapping on left. Enlarge deep obturator lymph on the left. Given high grade histology, short small bowel mesentery, and recent COVID infection, decision made not to proceed with left PA LND (would have likely required open procedure. NO intra-abdominal or pelvic evidence of disease.    02/04/2020 Pathology Results   FINAL MICROSCOPIC DIAGNOSIS:   A. SENTINEL LYMPH NODE, RIGHT OBTURATOR, EXCISION:  -  No carcinoma identified in one lymph node (0/1)  -  See comment   B. UTERUS AND BILATERAL FALLOPIAN TUBES AND OVARIES, HYSTERECTOMY AND  BILATERAL SALPINGO-OOPHORECTOMY:  -  Carcinosarcoma confined to the uterus and involving less than half of  the myometrium  -  See oncology table and comment below   C. LYMPH NODES, LEFT PELVIC, REGIONAL RESECTION:  -  No carcinoma identified in three lymph nodes (0/3)   D. SENTINEL LYMPH NODE, PRESACRAL, EXCISION:  -  No carcinoma identified in one lymph node (0/1)   COMMENT:   A.  Cytokeratin AE1/3 was performed on the sentinel lymph nodes to  exclude micrometastasis.  There is no evidence of metastatic carcinoma  by immunohistochemistry.   B.  The tumor consists of poorly cohesive epithelioid and rhabdoid  cells.  A panel of immunohistochemistry (p16, f, p53, PAX 8, SMA, CD10,  CD45, CD 99, CD117, CD138,  cytokeratin 7, cyclin D1, cytokeratin AE1/3,  EMA and GATA3) is performed to better classify this neoplasm.  By   immunohistochemistry, the neoplastic cells are positive for p16, p53 and  PAX 8.  The epithelioid cells are positive for cytokeratin AE1/3 and EMA  while the rhabdoid cells show positivity for desmin. Overall, the  immunophenotype and morphology supports the diagnosis of carcinosarcoma.    Dr. Tresa Moore reviewed the case and agrees with the above diagnosis.   ONCOLOGY TABLE:   UTERUS, CARCINOMA OR CARCINOSARCOMA   Procedure: Total hysterectomy and bilateral salpingo-oophorectomy  Histologic type: Carcinosarcoma  Histologic Grade: FIGO grade 3  Myometrial invasion: Estimated to be less than 50%  Uterine Serosa Involvement: Not identified  Cervical stromal involvement: Not identified  Extent of involvement of other organs: Not identified  Lymphovascular invasion: Not identified  Regional Lymph Nodes:       Examined:       2 Sentinel                               3 non-sentinel                               5 total        Lymph nodes with metastasis: 0        Isolated tumor cells (<0.2 mm): 0        Micrometastasis:  (>0.2 mm and < 2.0 mm): 0        Macrometastasis: (>2.0 mm): 0        Extracapsular extension: N/A  Representative Tumor Block: B1  MMR / MSI testing: Will be ordered  Pathologic Stage Classification (pTNM, AJCC 8th edition):  pT1a, pN0  Comments: See above      Interval History: Patient reports overall doing well from a postoperative standpoint.  She is tolerating a regular diet without nausea or emesis.  She is reporting return of bowel function and denies any urinary symptoms.  She denies any vaginal bleeding, discharge, fevers or chills.  Unfortunately, she lost her husband the day after surgery.  He had also fallen ill from COVID-19 and was admitted and in the ICU at the time of her operation.  Past Medical/Surgical History: Past Medical History:  Diagnosis Date  . Adenomatous colon polyp 11/2005  . Anginal pain ([place])    last time 2016  . Anxiety   .  Arthritis   . CAD (coronary artery disease)    2V CABG 1996 (LIMA to LAD, SVG to RCA) Dr. Servando Snare.; last stress test in 2010  . Diabetes mellitus without complication ([place])    type II  . GERD (gastroesophageal reflux disease)   . Hyperlipidemia   . Hypertension   . Muscle cramps    and pains  . Myocardial infarction ([place])    1996  . Pneumonia    hx of x 2   . Ulcerative colitis   . Uterine cancer [facility])     Past Surgical History:  Procedure Laterality Date  . BACK SURGERY     lumbar fusion 25 years ago and ruptured disk 10 years ago same area  . BACK SURGERY     ruptered disc under lumbar disc  . CARDIAC CATHETERIZATION N/A 10/13/2014   Procedure: Left Heart Cath And Coronary Angiography;  Surgeon: Burnell Blanks, MD;  Location: [facility] INVASIVE CV LAB CUPID;  Service: Cardiovascular;  Laterality: N/A;  . CARDIAC CATHETERIZATION N/A 10/13/2014   Procedure: Coronary Stent Intervention;  Surgeon: Burnell Blanks, MD;  Location: [place] INVASIVE CV LAB CUPID;  Service: Cardiovascular;  Laterality: N/A;  . COLONOSCOPY    . CORONARY ANGIOPLASTY WITH STENT PLACEMENT  10/13/2014   DES to [place]  . CORONARY ARTERY BYPASS GRAFT  1996   x 2  . DILATATION & CURETTAGE/HYSTEROSCOPY WITH MYOSURE N/A 01/03/2020   Procedure: DILATATION & CURETTAGE/HYSTEROSCOPY WITH POSSIBLE MYOSURE;  Surgeon: Paula Compton, MD;  Location: [place];  Service: Gynecology;  Laterality: N/A;  . DILATION AND EVACUATION N/A 01/03/2020   Procedure: DILATATION AND EVACUATION;  Surgeon: Paula Compton, MD;  Location: [place];  Service: Gynecology;  Laterality: N/A;  . OPERATIVE ULTRASOUND N/A 01/03/2020   Procedure: OPERATIVE ULTRASOUND;  Surgeon: Paula Compton, MD;  Location: [place];  Service: Gynecology;  Laterality: N/A;  . POLYPECTOMY    . ROBOTIC ASSISTED TOTAL HYSTERECTOMY WITH BILATERAL SALPINGO OOPHERECTOMY Bilateral 02/04/2020   Procedure: XI ROBOTIC ASSISTED TOTAL HYSTERECTOMY WITH BILATERAL  SALPINGO OOPHORECTOMY, LYMPH NODE DISSECTION;  Surgeon: Lafonda Mosses, MD;  Location: [facility];  Service: Gynecology;  Laterality: Bilateral;  . SENTINEL NODE BIOPSY N/A 02/04/2020   Procedure: SENTINEL NODE BIOPSY;  Surgeon: Lafonda Mosses, MD;  Location: [facility];  Service: Gynecology;  Laterality: N/A;  . UPPER GASTROINTESTINAL ENDOSCOPY      Family History  Problem Relation Age of Onset  . Diabetes Mother   . Heart disease Mother   . Heart attack Mother 83  . Hypertension Mother   . Lung cancer Brother   . Colon cancer Brother   . Breast cancer Sister   . Kidney disease Brother        renal cancer  . Heart disease Sister   . Colon cancer Daughter        Thinks her daughter had negative genetic testing  . Hypertension Sister   . Stroke Sister   . Pancreatitis Other   . Breast cancer Daughter   . Stomach cancer Neg Hx     Social History   Socioeconomic History  . Marital status: Married    Spouse name: Ray  . Number of children: 7  . Years of education: 40  . Highest education level: Not on file  Occupational History  . Occupation: retired  Tobacco Use  . Smoking status: Former Smoker    Years: 20.00    Quit date: 10/04/1994    Years since quitting: 25.3  . Smokeless tobacco: Never Used  Vaping Use  . Vaping Use: Never used  Substance and Sexual Activity  . Alcohol use: No  . Drug use: No  . Sexual activity: Not Currently    Partners: Male  Other Topics Concern  . Not on file  Social History Narrative  . Not on file   Social Determinants of Health   Financial Resource Strain:   . Difficulty of Paying Living Expenses: Not on file  Food Insecurity:   . Worried About Charity fundraiser in the Last Year: Not on file  . Ran Out of Food in the Last Year: Not on file  Transportation Needs:   . Lack of Transportation (Medical): Not on file  . Lack of Transportation (Non-Medical): Not on file  Physical Activity:   . Days of Exercise per Week: Not on  file  . Minutes of Exercise per Session: Not on file  Stress:   . Feeling of Stress : Not on  file  Social Connections:   . Frequency of Communication with Friends and Family: Not on file  . Frequency of Social Gatherings with Friends and Family: Not on file  . Attends Religious Services: Not on file  . Active Member of Clubs or Organizations: Not on file  . Attends Archivist Meetings: Not on file  . Marital Status: Not on file    Current Medications:  Current Outpatient Medications:  .  aspirin EC 81 MG tablet, Take 81 mg by mouth daily. Swallow whole. , Disp: , Rfl:  .  balsalazide (COLAZAL) 750 MG capsule, TAKE 2 CAPSULES (1,500 MG TOTAL) BY MOUTH 2 (TWO) TIMES DAILY. TAKE 2 CAPSULES TWO TIMES DAILY (Patient taking differently: Take 1,500 mg by mouth 2 (two) times daily. ), Disp: 360 capsule, Rfl: 0 .  calcium carbonate (CALCIUM 600) 600 MG TABS tablet, Take 600 mg by mouth daily with breakfast., Disp: , Rfl:  .  carvedilol (COREG) 3.125 MG tablet, TAKE 1 TABLET (3.125 MG TOTAL) BY MOUTH 2 (TWO) TIMES DAILY., Disp: 180 tablet, Rfl: 2 .  Cholecalciferol (VITAMIN D) 50 MCG (2000 UT) tablet, Take 2,000 Units by mouth daily. , Disp: , Rfl:  .  clopidogrel (PLAVIX) 75 MG tablet, Take 75 mg by mouth daily. Last dose prior to D and C on 01-03-20 , Disp: , Rfl:  .  docusate sodium (COLACE) 100 MG capsule, Take 100 mg by mouth daily. , Disp: , Rfl:  .  FLUoxetine (PROZAC) 10 MG capsule, Take 10 mg by mouth daily. , Disp: , Rfl:  .  ibuprofen (ADVIL) 600 MG tablet, Take 600 mg by mouth every 6 (six) hours as needed for moderate pain. , Disp: , Rfl:  .  LORazepam (ATIVAN) 0.5 MG tablet, Take 0.5 mg by mouth as needed for anxiety (for anxiety). Reported on 12/04/2015, Disp: , Rfl:  .  losartan-hydrochlorothiazide (HYZAAR) 100-12.5 MG tablet, Take 1 tablet by mouth daily., Disp: , Rfl:  .  metFORMIN (GLUCOPHAGE-XR) 500 MG 24 hr tablet, Take 500 mg by mouth daily with supper. , Disp: , Rfl:  5 .  Multiple Vitamins-Minerals (MULTIVITAMIN WITH MINERALS) tablet, Take 1 tablet by mouth daily.  , Disp: , Rfl:  .  nitroGLYCERIN (NITROSTAT) 0.4 MG SL tablet, Place 1 tablet (0.4 mg total) under the tongue every 5 (five) minutes as needed for chest pain., Disp: 25 tablet, Rfl: 6 .  oxyCODONE (OXY IR/ROXICODONE) 5 MG immediate release tablet, Take 1 tablet (5 mg total) by mouth every 4 (four) hours as needed for severe pain. For AFTER surgery, do not take and drive, Disp: 10 tablet, Rfl: 0 .  pantoprazole (PROTONIX) 40 MG tablet, TAKE 1 TABLET BY MOUTH EVERY DAY, Disp: 30 tablet, Rfl: 0 .  senna-docusate (SENOKOT-S) 8.6-50 MG tablet, Take 2 tablets by mouth at bedtime. For AFTER surgery, do not take if having diarrhea, Disp: 30 tablet, Rfl: 0 .  simvastatin (ZOCOR) 40 MG tablet, Take 40 mg by mouth every evening. , Disp: , Rfl: 1  Review of Symptoms: Complete 10-system review is negative except as above in Interval History.  Physical Exam: There were no vitals taken for this visit. Deferred given limitations of phone visit  Laboratory & Radiologic Studies: Noted above in treatment history  Assessment & Plan: [name] [name] is a 74 y.o. woman with Stage IA carcinosarcoma who presents for postoperative follow-up phone call and discussion of pathology results.  Patient is overall healing well from surgery.  Unfortunately, she lost  her husband to Covid last week.  Both she and her husband were vaccinated.  She is understandably upset and I offered my condolences.  We discussed her final pathology report in detail.  She was pleased regarding the results that the tumor was limited to the uterus.  Given that this is a higher risk cancer, we discussed the role of adjuvant therapy in the form of systemic therapy with carboplatin and paclitaxel possibly with the addition of vaginal brachytherapy.  She knows that we will discuss this further at her follow-up visit.  I discussed the assessment and  treatment plan with the patient. The patient was provided with an opportunity to ask questions and all were answered. The patient agreed with the plan and demonstrated an understanding of the instructions.   The patient was advised to call back or see an in-person evaluation if the symptoms worsen or if the condition fails to improve as anticipated.   18 minutes of total time was spent for this patient encounter, including preparation, face-to-face counseling with the patient and coordination of care, and documentation of the encounter.   Jeral Pinch, MD  Division of Gynecologic Oncology  Department of Obstetrics and Gynecology  [facility]

## 2020-02-14 ENCOUNTER — Encounter: Payer: PPO | Admitting: Gynecologic Oncology

## 2020-02-24 ENCOUNTER — Telehealth: Payer: Self-pay | Admitting: *Deleted

## 2020-02-24 ENCOUNTER — Encounter (HOSPITAL_COMMUNITY): Payer: Self-pay | Admitting: Gynecologic Oncology

## 2020-02-24 ENCOUNTER — Other Ambulatory Visit: Payer: Self-pay | Admitting: Oncology

## 2020-02-24 ENCOUNTER — Telehealth: Payer: Self-pay | Admitting: Oncology

## 2020-02-24 NOTE — Progress Notes (Signed)
Gynecologic Oncology Multi-Disciplinary Disposition Conference Note  Date of the Conference: 02/24/2020  Patient Name: [name] [name]  Referring Provider: Dr. Delilah Shan Primary GYN Oncologist: Dr. Denman George  Stage/Disposition:  Stage IA carcinosarcoma of the uterus. Disposition is for adjuvant therapy with carboplatin and paclitaxel and consideration for vaginal brachytherapy.   This Multidisciplinary conference took place involving physicians from [place], [place], Radiation Oncology, Pathology, Radiology along with the Gynecologic Oncology Nurse Practitioner and RN.  Comprehensive assessment of the patient's malignancy, staging, need for surgery, chemotherapy, radiation therapy, and need for further testing were reviewed. Supportive measures, both inpatient and following discharge were also discussed. The recommended plan of care is documented. Greater than 35 minutes were spent correlating and coordinating this patient's care.

## 2020-02-24 NOTE — Telephone Encounter (Signed)
Spoke with the patient and attempted to move the patient's appt to an earlier time tomorrow afternoon. Patient unable to move appt

## 2020-02-24 NOTE — Telephone Encounter (Signed)
Called [name] [name] and advised her of recommendation from the GYN cancer conference this morning for adjuvant chemotherapy and vaginal brachytherapy.  She verbalized understanding and agreement. Also reviewed upcoming appointment with Dr. Berline Lopes tomorrow.

## 2020-02-25 ENCOUNTER — Other Ambulatory Visit: Payer: Self-pay

## 2020-02-25 ENCOUNTER — Encounter: Payer: Self-pay | Admitting: Gynecologic Oncology

## 2020-02-25 ENCOUNTER — Inpatient Hospital Stay (HOSPITAL_BASED_OUTPATIENT_CLINIC_OR_DEPARTMENT_OTHER): Payer: PPO | Admitting: Gynecologic Oncology

## 2020-02-25 ENCOUNTER — Encounter: Payer: Self-pay | Admitting: Oncology

## 2020-02-25 VITALS — BP 137/50 | HR 78 | Temp 97.0°F | Resp 18 | Wt 170.2 lb

## 2020-02-25 DIAGNOSIS — Z90710 Acquired absence of both cervix and uterus: Secondary | ICD-10-CM | POA: Diagnosis not present

## 2020-02-25 DIAGNOSIS — R5383 Other fatigue: Secondary | ICD-10-CM | POA: Diagnosis not present

## 2020-02-25 DIAGNOSIS — C55 Malignant neoplasm of uterus, part unspecified: Secondary | ICD-10-CM

## 2020-02-25 DIAGNOSIS — M1990 Unspecified osteoarthritis, unspecified site: Secondary | ICD-10-CM | POA: Diagnosis not present

## 2020-02-25 DIAGNOSIS — Z7984 Long term (current) use of oral hypoglycemic drugs: Secondary | ICD-10-CM | POA: Diagnosis not present

## 2020-02-25 DIAGNOSIS — Z7902 Long term (current) use of antithrombotics/antiplatelets: Secondary | ICD-10-CM | POA: Diagnosis not present

## 2020-02-25 DIAGNOSIS — Z87891 Personal history of nicotine dependence: Secondary | ICD-10-CM | POA: Diagnosis not present

## 2020-02-25 DIAGNOSIS — R42 Dizziness and giddiness: Secondary | ICD-10-CM | POA: Diagnosis not present

## 2020-02-25 DIAGNOSIS — Z7982 Long term (current) use of aspirin: Secondary | ICD-10-CM | POA: Diagnosis not present

## 2020-02-25 DIAGNOSIS — Z90722 Acquired absence of ovaries, bilateral: Secondary | ICD-10-CM

## 2020-02-25 DIAGNOSIS — E785 Hyperlipidemia, unspecified: Secondary | ICD-10-CM | POA: Diagnosis not present

## 2020-02-25 DIAGNOSIS — E119 Type 2 diabetes mellitus without complications: Secondary | ICD-10-CM | POA: Diagnosis not present

## 2020-02-25 DIAGNOSIS — F329 Major depressive disorder, single episode, unspecified: Secondary | ICD-10-CM | POA: Diagnosis not present

## 2020-02-25 DIAGNOSIS — K219 Gastro-esophageal reflux disease without esophagitis: Secondary | ICD-10-CM | POA: Diagnosis not present

## 2020-02-25 DIAGNOSIS — I209 Angina pectoris, unspecified: Secondary | ICD-10-CM | POA: Diagnosis not present

## 2020-02-25 DIAGNOSIS — Z951 Presence of aortocoronary bypass graft: Secondary | ICD-10-CM | POA: Diagnosis not present

## 2020-02-25 DIAGNOSIS — I2510 Atherosclerotic heart disease of native coronary artery without angina pectoris: Secondary | ICD-10-CM | POA: Diagnosis not present

## 2020-02-25 DIAGNOSIS — Z79899 Other long term (current) drug therapy: Secondary | ICD-10-CM | POA: Diagnosis not present

## 2020-02-25 DIAGNOSIS — I252 Old myocardial infarction: Secondary | ICD-10-CM | POA: Diagnosis not present

## 2020-02-25 DIAGNOSIS — I10 Essential (primary) hypertension: Secondary | ICD-10-CM | POA: Diagnosis not present

## 2020-02-25 DIAGNOSIS — F419 Anxiety disorder, unspecified: Secondary | ICD-10-CM | POA: Diagnosis not present

## 2020-02-25 DIAGNOSIS — Z23 Encounter for immunization: Secondary | ICD-10-CM | POA: Diagnosis not present

## 2020-02-25 DIAGNOSIS — Z8616 Personal history of COVID-19: Secondary | ICD-10-CM | POA: Diagnosis not present

## 2020-02-25 DIAGNOSIS — R519 Headache, unspecified: Secondary | ICD-10-CM | POA: Diagnosis not present

## 2020-02-25 NOTE — Patient Instructions (Signed)
You are healing very well from surgery! Keep holding your Plavix until we get the port for chemotherapy. Remember no heavy lifting or pushing until 6 weeks after surgery and nothing in the vagina until 8 weeks.

## 2020-02-25 NOTE — Progress Notes (Signed)
Went over upcoming appointments with Ludia and her daughter.  Provided her with the [place] folder and encouraged her to call with any questions.

## 2020-02-25 NOTE — Progress Notes (Signed)
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 Severe distension of the endometrium highly suspicious for cervical obstruction or stenosis possibly due to a neoplastic process in this postmenopausal female with history of vaginal bleeding. Further evaluation with hysteroscopy is recommended. 2. Diarrheal state. Correlation with clinical exam and stool cultures recommended. No bowel obstruction. Normal appendix.   01/03/2020 Surgery   D&C A. ENDOMETRIUM, CURETTAGE:  -  Poorly differentiated malignancy  -  See comment  COMMENT:  The material consists of a poorly differentiated malignancy with  extensive necrosis.  By immunohistochemistry, p16 is positive and there  is focal cytokeratin AE1/3 positivity but negative for cytokeratin 7,  cytokeratin 5/6, CD20, CD3, p53, WT-1, Pax-8, and S100. The differential  includes poorly differentiated carcinoma and high-grade stromal sarcoma.  Dr. Saralyn Pilar reviewed the case and agrees with the above diagnosis.  Dr.  Marvel Plan was notified of these results on January 08, 2020.    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 ON intra-abdominal entry, normal upper abdominal survey. Normal appearing omentum, small and large bowel. Uterus 10cm and bulbous. Tortuous and hyperemic ovarian vasculature. Some tubal metaplasia vs. Metastatic disease. Normal appearing ovaries. Mapping successful to deep right obturator SLN and presacral SLN. NO mapping on left. Enlarge deep obturator lymph on the left. Given high grade histology, short small bowel  mesentery, and recent COVID infection, decision made not to proceed with left PA LND (would have likely required open procedure. NO intra-abdominal or pelvic evidence of disease.    02/04/2020 Pathology Results   FINAL MICROSCOPIC DIAGNOSIS:   A. SENTINEL LYMPH NODE, RIGHT OBTURATOR, EXCISION:  -  No carcinoma identified in one lymph node (0/1)  -  See comment   B. UTERUS AND BILATERAL FALLOPIAN TUBES AND OVARIES, HYSTERECTOMY AND  BILATERAL SALPINGO-OOPHORECTOMY:  -  Carcinosarcoma confined to the uterus and involving less than half of  the myometrium  -  See oncology table and comment below   C. LYMPH NODES, LEFT PELVIC, REGIONAL RESECTION:  -  No carcinoma identified in three lymph nodes (0/3)   D. SENTINEL LYMPH NODE, PRESACRAL, EXCISION:  -  No carcinoma identified in one lymph node (0/1)   COMMENT:   A.  Cytokeratin AE1/3 was performed on the sentinel lymph nodes to  exclude micrometastasis.  There is no evidence of metastatic carcinoma  by immunohistochemistry.   B.  The tumor consists of poorly cohesive epithelioid and rhabdoid  cells.  A panel of immunohistochemistry (p16, f, p53, PAX 8, SMA, CD10,  CD45, CD 99, CD117, CD138, cytokeratin 7, cyclin D1, cytokeratin AE1/3,  EMA and GATA3) is performed to better classify this neoplasm.  By  immunohistochemistry, the neoplastic cells are positive for p16, p53 and  PAX 8.  The epithelioid cells are positive for cytokeratin AE1/3 and EMA  while the rhabdoid cells show positivity for desmin. Overall, the  immunophenotype and morphology supports the diagnosis of carcinosarcoma.    Dr. Tresa Moore reviewed the case and agrees with the above diagnosis.   ONCOLOGY TABLE:   UTERUS, CARCINOMA OR CARCINOSARCOMA   Procedure: Total hysterectomy and bilateral salpingo-oophorectomy  Histologic  type: Carcinosarcoma  Histologic Grade: FIGO grade 3  Myometrial invasion: Estimated to be less than 50%  Uterine Serosa Involvement: Not  identified  Cervical stromal involvement: Not identified  Extent of involvement of other organs: Not identified  Lymphovascular invasion: Not identified  Regional Lymph Nodes:       Examined:       2 Sentinel                               3 non-sentinel                               5 total        Lymph nodes with metastasis: 0        Isolated tumor cells (<0.2 mm): 0        Micrometastasis:  (>0.2 mm and < 2.0 mm): 0        Macrometastasis: (>2.0 mm): 0        Extracapsular extension: N/A  Representative Tumor Block: B1  MMR / MSI testing: Will be ordered  Pathologic Stage Classification (pTNM, AJCC 8th edition):  pT1a, pN0  Comments: See above    02/04/2020 Cancer Staging   Staging form: Corpus Uteri - Carcinoma and Carcinosarcoma, AJCC 8th Edition - Clinical stage from 02/04/2020: FIGO Stage IA (cT1a, cN0(sn), cM0) - Signed by Lafonda Mosses, MD on 02/12/2020     Interval History: Patient reports overall doing well since surgery.  Her incisions seem to be healing well.  She denies any vaginal bleeding or discharge.  Her appetite has overall been decreased but she attributes this is much to her husband's death the day after surgery as to her recovery.  She denies any nausea or emesis.  She reports regular bowel and bladder function.  She endorses fatigue, headaches, joint pain, dizziness, anxiety, and depression.  Past Medical/Surgical History: Past Medical History:  Diagnosis Date  . Adenomatous colon polyp 11/2005  . Anginal pain ([place])    last time 2016  . Anxiety   . Arthritis   . CAD (coronary artery disease)    2V CABG 1996 (LIMA to LAD, SVG to RCA) Dr. Servando Snare.; last stress test in 2010  . Diabetes mellitus without complication ([place])    type II  . GERD (gastroesophageal reflux disease)   . Hyperlipidemia   . Hypertension   . Muscle cramps    and pains  . Myocardial infarction ([place])    1996  . Pneumonia    hx of x 2   . Ulcerative colitis   . Uterine cancer  [facility])     Past Surgical History:  Procedure Laterality Date  . BACK SURGERY     lumbar fusion 25 years ago and ruptured disk 10 years ago same area  . BACK SURGERY     ruptered disc under lumbar disc  . CARDIAC CATHETERIZATION N/A 10/13/2014   Procedure: Left Heart Cath And Coronary Angiography;  Surgeon: Burnell Blanks, MD;  Location: [place] [facility];  Service: Cardiovascular;  Laterality: N/A;  . CARDIAC CATHETERIZATION N/A 10/13/2014   Procedure: Coronary Stent Intervention;  Surgeon: Burnell Blanks, MD;  Location: [place] IN[facility] CV [facility];  Service: Cardiovascular;  Laterality: N/A;  . COLONOSCOPY    . CORONARY ANGIOPLASTY WITH STENT PLACEMENT  10/13/2014   DES to [place]  . CORONARY ARTERY BYPASS GRAFT  1996   x 2  . DILATATION & CURETTAGE/HYSTEROSCOPY WITH MYOSURE N/A 01/03/2020   Procedure: DILATATION & CURETTAGE/HYSTEROSCOPY WITH POSSIBLE MYOSURE;  Surgeon: Paula Compton, MD;  Location: [place];  Service: Gynecology;  Laterality: N/A;  . DILATION AND EVACUATION N/A 01/03/2020   Procedure: DILATATION AND EVACUATION;  Surgeon: Paula Compton, MD;  Location: [place];  Service: Gynecology;  Laterality: N/A;  . OPERATIVE ULTRASOUND N/A 01/03/2020   Procedure: OPERATIVE ULTRASOUND;  Surgeon: Paula Compton, MD;  Location: [place];  Service: Gynecology;  Laterality: N/A;  . POLYPECTOMY    . ROBOTIC ASSISTED TOTAL HYSTERECTOMY WITH BILATERAL SALPINGO OOPHERECTOMY Bilateral 02/04/2020   Procedure: XI ROBOTIC ASSISTED TOTAL HYSTERECTOMY WITH BILATERAL SALPINGO OOPHORECTOMY, LYMPH NODE DISSECTION;  Surgeon: Lafonda Mosses, MD;  Location: [facility];  Service: Gynecology;  Laterality: Bilateral;  . SENTINEL NODE BIOPSY N/A 02/04/2020   Procedure: SENTINEL NODE BIOPSY;  Surgeon: Lafonda Mosses, MD;  Location: [facility];  Service: Gynecology;  Laterality: N/A;  . UPPER GASTROINTESTINAL ENDOSCOPY      Family History  Problem Relation Age of Onset  . Diabetes  Mother   . Heart disease Mother   . Heart attack Mother 36  . Hypertension Mother   . Lung cancer Brother   . Colon cancer Brother   . Breast cancer Sister   . Kidney disease Brother        renal cancer  . Heart disease Sister   . Colon cancer Daughter        Thinks her daughter had negative genetic testing  . Hypertension Sister   . Stroke Sister   . Pancreatitis Other   . Breast cancer Daughter   . Stomach cancer Neg Hx     Social History   Socioeconomic History  . Marital status: Married    Spouse name: Ray  . Number of children: 7  . Years of education: 83  . Highest education level: Not on file  Occupational History  . Occupation: retired  Tobacco Use  . Smoking status: Former Smoker    Years: 20.00    Quit date: 10/04/1994    Years since quitting: 25.4  . Smokeless tobacco: Never Used  Vaping Use  . Vaping Use: Never used  Substance and Sexual Activity  . Alcohol use: No  . Drug use: No  . Sexual activity: Not Currently    Partners: Male  Other Topics Concern  . Not on file  Social History Narrative  . Not on file   Social Determinants of Health   Financial Resource Strain:   . Difficulty of Paying Living Expenses: Not on file  Food Insecurity:   . Worried About Charity fundraiser in the Last Year: Not on file  . Ran Out of Food in the Last Year: Not on file  Transportation Needs:   . Lack of Transportation (Medical): Not on file  . Lack of Transportation (Non-Medical): Not on file  Physical Activity:   . Days of Exercise per Week: Not on file  . Minutes of Exercise per Session: Not on file  Stress:   . Feeling of Stress : Not on file  Social Connections:   . Frequency of Communication with Friends and Family: Not on file  . Frequency of Social Gatherings with Friends and Family: Not on file  . Attends Religious Services: Not on file  . Active Member of Clubs or Organizations: Not on file  . Attends Archivist Meetings: Not on file   .  Marital Status: Not on file    Current Medications:  Current Outpatient Medications:  .  aspirin EC 81 MG tablet, Take 81 mg by mouth daily. Swallow whole. , Disp: , Rfl:  .  balsalazide (COLAZAL) 750 MG capsule, TAKE 2 CAPSULES (1,500 MG TOTAL) BY MOUTH 2 (TWO) TIMES DAILY. TAKE 2 CAPSULES TWO TIMES DAILY (Patient taking differently: Take 1,500 mg by mouth 2 (two) times daily. ), Disp: 360 capsule, Rfl: 0 .  calcium carbonate (CALCIUM 600) 600 MG TABS tablet, Take 600 mg by mouth daily with breakfast., Disp: , Rfl:  .  carvedilol (COREG) 3.125 MG tablet, TAKE 1 TABLET (3.125 MG TOTAL) BY MOUTH 2 (TWO) TIMES DAILY., Disp: 180 tablet, Rfl: 2 .  Cholecalciferol (VITAMIN D) 50 MCG (2000 UT) tablet, Take 2,000 Units by mouth daily. , Disp: , Rfl:  .  docusate sodium (COLACE) 100 MG capsule, Take 100 mg by mouth daily. , Disp: , Rfl:  .  FLUoxetine (PROZAC) 10 MG capsule, Take 10 mg by mouth daily. , Disp: , Rfl:  .  ibuprofen (ADVIL) 600 MG tablet, Take 600 mg by mouth every 6 (six) hours as needed for moderate pain. , Disp: , Rfl:  .  LORazepam (ATIVAN) 0.5 MG tablet, Take 0.5 mg by mouth as needed for anxiety (for anxiety). Reported on 12/04/2015, Disp: , Rfl:  .  losartan-hydrochlorothiazide (HYZAAR) 100-12.5 MG tablet, Take 1 tablet by mouth daily., Disp: , Rfl:  .  metFORMIN (GLUCOPHAGE-XR) 500 MG 24 hr tablet, Take 500 mg by mouth daily with supper. , Disp: , Rfl: 5 .  Multiple Vitamins-Minerals (MULTIVITAMIN WITH MINERALS) tablet, Take 1 tablet by mouth daily.  , Disp: , Rfl:  .  pantoprazole (PROTONIX) 40 MG tablet, TAKE 1 TABLET BY MOUTH EVERY DAY, Disp: 30 tablet, Rfl: 0 .  simvastatin (ZOCOR) 40 MG tablet, Take 40 mg by mouth every evening. , Disp: , Rfl: 1 .  clopidogrel (PLAVIX) 75 MG tablet, Take 75 mg by mouth daily. Last dose prior to D and C on 01-03-20  (Patient not taking: Reported on 02/25/2020), Disp: , Rfl:  .  nitroGLYCERIN (NITROSTAT) 0.4 MG SL tablet, Place 1 tablet (0.4 mg  total) under the tongue every 5 (five) minutes as needed for chest pain., Disp: 25 tablet, Rfl: 6 .  oxyCODONE (OXY IR/ROXICODONE) 5 MG immediate release tablet, Take 1 tablet (5 mg total) by mouth every 4 (four) hours as needed for severe pain. For AFTER surgery, do not take and drive (Patient not taking: Reported on 02/25/2020), Disp: 10 tablet, Rfl: 0 .  senna-docusate (SENOKOT-S) 8.6-50 MG tablet, Take 2 tablets by mouth at bedtime. For AFTER surgery, do not take if having diarrhea (Patient not taking: Reported on 02/25/2020), Disp: 30 tablet, Rfl: 0  Review of Systems: Pertinent positives as per HPI Denies appetite changes, fevers, chills, unexplained weight changes. Denies hearing loss, neck lumps or masses, mouth sores, ringing in ears or voice changes. Denies cough or wheezing.  Denies shortness of breath. Denies chest pain or palpitations. Denies leg swelling. Denies abdominal distention, blood in stools, constipation, diarrhea, nausea, vomiting, or early satiety. Denies pain with intercourse, dysuria, frequency, hematuria or incontinence. Denies pelvic pain, vaginal bleeding or vaginal discharge.   Denies back pain or muscle pain/cramps. Denies itching, rash, or wounds. Denies numbness or seizures. Denies swollen lymph nodes or glands, denies easy bruising or bleeding. Denies confusion, or decreased concentration.  Physical Exam: BP (!) 137/50 (BP Location: Left Arm, Patient Position: Sitting)  Pulse 78   Temp (!) 97 F (36.1 C) (Tympanic)   Resp 18   Wt 170 lb 3.2 oz (77.2 kg)   SpO2 97%   BMI 27.47 kg/m  General: Alert, oriented, no acute distress. HEENT: Normocephalic, atraumatic, sclera anicteric. Chest: Unlabored breathing on room air. Abdomen: soft, nontender.  Normoactive bowel sounds.  No masses or hepatosplenomegaly appreciated.  Well-healing laparoscopic incisions. Extremities: Grossly normal range of motion.  Warm, well perfused.  No edema bilaterally. Skin: No  rashes or lesions noted. GU: Normal appearing external genitalia without erythema, excoriation, or lesions.  Speculum exam reveals moderately atrophic vaginal mucosa, cuff healing well with suture still visible.  Bimanual exam reveals cuff intact, minimal bleeding noted with speculum manipulation.  No tenderness or fluctuance.  Laboratory & Radiologic Studies: None new  Assessment & Plan: [name] [name] is a 74 y.o. woman with Stage IA carcinosarcoma who presents for follow-up.  Patient is overall doing well from a surgical standpoint and healing well.  We discussed continued activity restriction and expectations.  We reviewed in detail again with the patient and her daughter pathology report and findings at the time of surgery.  I discussed that while this is early stage, given high risk histology, adjuvant treatment in the form of carboplatin and paclitaxel with vaginal brachytherapy is recommended.  We discussed treatment expectations, side effects, and plan moving forward.  The patient voices willingness to move forward with scheduling an appointment with medical oncology as well as radiation oncology.  Santiago Glad, our nurse navigator, met with the patient to coordinate scheduling of these appointments.  I have asked the patient to continue holding her Plavix and stay on baby aspirin until she gets her port placed.  22 minutes of total time was spent for this patient encounter, including preparation, face-to-face counseling with the patient and coordination of care, and documentation of the encounter.  Jeral Pinch, MD  Division of Gynecologic Oncology  Department of Obstetrics and Gynecology  [facility]

## 2020-02-26 ENCOUNTER — Telehealth: Payer: Self-pay | Admitting: Oncology

## 2020-02-26 ENCOUNTER — Other Ambulatory Visit: Payer: Self-pay | Admitting: Cardiovascular Disease

## 2020-02-26 NOTE — Telephone Encounter (Signed)
[name] [name] called and asked for an itemized bill for her cancer policy.  Gave her the phone number for patient financial services.

## 2020-02-27 ENCOUNTER — Encounter: Payer: Self-pay | Admitting: Hematology and Oncology

## 2020-02-27 ENCOUNTER — Inpatient Hospital Stay (HOSPITAL_BASED_OUTPATIENT_CLINIC_OR_DEPARTMENT_OTHER): Payer: PPO | Admitting: Hematology and Oncology

## 2020-02-27 ENCOUNTER — Other Ambulatory Visit: Payer: Self-pay

## 2020-02-27 VITALS — BP 144/62 | HR 76 | Temp 97.6°F | Resp 18 | Ht 66.0 in | Wt 172.3 lb

## 2020-02-27 DIAGNOSIS — I2510 Atherosclerotic heart disease of native coronary artery without angina pectoris: Secondary | ICD-10-CM | POA: Diagnosis not present

## 2020-02-27 DIAGNOSIS — Z299 Encounter for prophylactic measures, unspecified: Secondary | ICD-10-CM

## 2020-02-27 DIAGNOSIS — C55 Malignant neoplasm of uterus, part unspecified: Secondary | ICD-10-CM

## 2020-02-27 DIAGNOSIS — Z23 Encounter for immunization: Secondary | ICD-10-CM | POA: Diagnosis not present

## 2020-02-27 DIAGNOSIS — R519 Headache, unspecified: Secondary | ICD-10-CM | POA: Diagnosis not present

## 2020-02-27 MED ORDER — LIDOCAINE-PRILOCAINE 2.5-2.5 % EX CREA: TOPICAL_CREAM | CUTANEOUS | 3 refills | Status: DC

## 2020-02-27 MED ORDER — PROCHLORPERAZINE MALEATE 10 MG PO TABS: 10.0000 mg | ORAL_TABLET | Freq: Four times a day (QID) | ORAL | 1 refills | Status: DC | PRN

## 2020-02-27 MED ORDER — ONDANSETRON HCL 8 MG PO TABS: 8.0000 mg | ORAL_TABLET | Freq: Three times a day (TID) | ORAL | 1 refills | Status: DC | PRN

## 2020-02-27 MED ORDER — INFLUENZA VAC A&B SA ADJ QUAD 0.5 ML IM PRSY
0.5000 mL | PREFILLED_SYRINGE | Freq: Once | INTRAMUSCULAR | Status: AC
  Administered 2020-02-27: 0.5 mL via INTRAMUSCULAR

## 2020-02-27 MED ORDER — INFLUENZA VAC A&B SA ADJ QUAD 0.5 ML IM PRSY
PREFILLED_SYRINGE | INTRAMUSCULAR | Status: AC
  Filled 2020-02-27: qty 0.5

## 2020-02-27 MED ORDER — DEXAMETHASONE 4 MG PO TABS: ORAL_TABLET | ORAL | 6 refills | Status: DC

## 2020-02-27 NOTE — Assessment & Plan Note (Signed)
We discussed the importance of preventive care and reviewed the vaccination programs. She does not have any prior allergic reactions to influenza vaccination. She agrees to proceed with influenza vaccination today and we will administer it today at the clinic.  

## 2020-02-27 NOTE — Assessment & Plan Note (Signed)
She has no recent unstable angina She will continue to hold Plavix until after her port is placed

## 2020-02-27 NOTE — Assessment & Plan Note (Signed)
We reviewed the NCCN guidelines We discussed the role of chemotherapy. The intent is of curative intent.  We discussed some of the risks, benefits, side-effects of carboplatin & Taxol. Treatment is intravenous, every 3 weeks x 6 cycles  Some of the short term side-effects included, though not limited to, including weight loss, life threatening infections, risk of allergic reactions, need for transfusions of blood products, nausea, vomiting, change in bowel habits, loss of hair, admission to hospital for various reasons, and risks of death.   Long term side-effects are also discussed including risks of infertility, permanent damage to nerve function, hearing loss, chronic fatigue, kidney damage with possibility needing hemodialysis, and rare secondary malignancy including bone marrow disorders.  The patient is aware that the response rates discussed earlier is not guaranteed.  After a long discussion, patient made an informed decision to proceed with the prescribed plan of care.   Patient education material was dispensed. We discussed premedication with dexamethasone before chemotherapy. We discussed port placement and chemo education class I will see her back prior to cycle 2 of treatment She would like to get her treatment started on September 30

## 2020-02-27 NOTE — Progress Notes (Signed)
START ON PATHWAY REGIMEN - Uterine     A cycle is every 21 days:     Paclitaxel      Carboplatin   **Always confirm dose/schedule in your pharmacy ordering system**  Patient Characteristics: Carcinosarcoma, Newly Diagnosed, Postoperative (Pathologic Staging), Postoperative Histology: Carcinosarcoma Therapeutic Status: Newly Diagnosed, Postoperative (Pathologic Staging) AJCC M Category: cM0 AJCC 8 Stage Grouping: I AJCC T Category: pT1 AJCC N Category: pN0 Intent of Therapy: Curative Intent, Discussed with Patient

## 2020-02-27 NOTE — Progress Notes (Signed)
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 The intent is of curative intent.  We discussed some of the risks, benefits, side-effects of carboplatin & Taxol. Treatment is intravenous, every 3 weeks x 6 cycles  Some of the short term side-effects included, though not limited to, including weight loss, life threatening infections, risk of allergic reactions, need for transfusions of blood products, nausea, vomiting, change in bowel habits, loss of hair, admission to hospital for various reasons, and risks of death.   Long term side-effects are also discussed including risks of infertility, permanent damage to nerve function, hearing loss, chronic fatigue, kidney damage with possibility needing hemodialysis, and rare secondary malignancy including bone marrow disorders.  The patient is aware that the response rates discussed earlier is not guaranteed.  After a long discussion, patient made an informed decision to proceed with the prescribed plan of care.   Patient education material was dispensed. We discussed premedication with dexamethasone before chemotherapy. We discussed port placement and chemo education class I will see her back prior to cycle 2 of treatment She would like to get her treatment started on September 30  CAD (coronary artery disease) She has no recent unstable angina She will continue to hold Plavix until after her port is placed  Preventive measure We discussed the importance of preventive care and reviewed the vaccination programs. She does not have any prior allergic reactions to influenza vaccination. She agrees to proceed with influenza vaccination today and we will administer it today at the clinic.    Orders  Placed This Encounter  Procedures  . IR IMAGING GUIDED PORT INSERTION    Standing Status:   Future    Standing Expiration Date:   02/26/2021    Order Specific Question:   Reason for Exam (SYMPTOM  OR DIAGNOSIS REQUIRED)    Answer:   need port for chemo to start 9/30    Order Specific Question:   Preferred Imaging Location?    Answer:   [facility]  . CBC with Differential ([place] Only)    Standing Status:   Standing    Number of Occurrences:   20    Standing Expiration Date:   02/26/2021  . CMP ([place] only)    Standing Status:   Standing    Number of Occurrences:   20    Standing Expiration Date:   02/26/2021    The total time spent in the appointment was 60 minutes encounter with patients including review of chart and various tests results, discussions about plan of care and coordination of care plan   All questions were answered. The patient knows to call the clinic with any problems, questions or concerns. No barriers to learning was detected.  Heath Lark, MD 9/16/202112:41 PM  CHIEF COMPLAINTS/PURPOSE OF CONSULTATION:  Urine cancer, for adjuvant treatment  HISTORY OF PRESENTING ILLNESS:  [name] [name] 74 y.o. female is here because of recent diagnosis of uterine cancer The patient presented with postmenopausal bleeding/discharge leading to further work-up, biopsy and subsequent surgery Her daughter, Suanne Marker is present throughout the visit  I have reviewed her chart and materials related to her cancer extensively and collaborated history with the patient. Summary of oncologic history is as follows: Oncology History Overview Note  IHC  MMR intact MSI-stable Carcinosarcoma   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[place])  01/01/2020 Imaging   CT A/P: 1. Severe distension of the endometrium highly suspicious for cervical obstruction or stenosis possibly due to a neoplastic process in this postmenopausal female with history of vaginal bleeding. Further evaluation with  hysteroscopy is  recommended. 2. Diarrheal state. Correlation with clinical exam and stool cultures recommended. No bowel obstruction. Normal appendix.   01/03/2020 Surgery   D&C A. ENDOMETRIUM, CURETTAGE:  -  Poorly differentiated malignancy  -  See comment  COMMENT:  The material consists of a poorly differentiated malignancy with extensive necrosis.  By immunohistochemistry, p16 is positive and there is focal cytokeratin AE1/3 positivity but negative for cytokeratin 7, cytokeratin 5/6, CD20, CD3, p53, WT-1, Pax-8, and S100. The differential includes poorly differentiated carcinoma and high-grade stromal sarcoma.    02/04/2020 Surgery   Robotic-assisted laparoscopic total hysterectomy with bilateral salpingoophorectomy, SLN biopsy, left pelvic LND   On EUA, 10cm mobile uterus. ON intra-abdominal entry, normal upper abdominal survey. Normal appearing omentum, small and large bowel. Uterus 10cm and bulbous. Tortuous and hyperemic ovarian vasculature. Some tubal metaplasia vs. Metastatic disease. Normal appearing ovaries. Mapping successful to deep right obturator SLN and presacral SLN. NO mapping on left. Enlarge deep obturator lymph on the left. Given high grade histology, short small bowel mesentery, and recent COVID infection, decision made not to proceed with left PA LND (would have likely required open procedure. NO intra-abdominal or pelvic evidence of disease.    02/04/2020 Pathology Results   FINAL MICROSCOPIC DIAGNOSIS:   A. SENTINEL LYMPH NODE, RIGHT OBTURATOR, EXCISION:  -  No carcinoma identified in one lymph node (0/1)  -  See comment   B. UTERUS AND BILATERAL FALLOPIAN TUBES AND OVARIES, HYSTERECTOMY AND  BILATERAL SALPINGO-OOPHORECTOMY:  -  Carcinosarcoma confined to the uterus and involving less than half of the myometrium  -  See oncology table and comment below   C. LYMPH NODES, LEFT PELVIC, REGIONAL RESECTION:  -  No carcinoma identified in three lymph nodes (0/3)   D.  SENTINEL LYMPH NODE, PRESACRAL, EXCISION:  -  No carcinoma identified in one lymph node (0/1)   COMMENT:   A.  Cytokeratin AE1/3 was performed on the sentinel lymph nodes to exclude micrometastasis.  There is no evidence of metastatic carcinoma by immunohistochemistry.   B.  The tumor consists of poorly cohesive epithelioid and rhabdoid cells.  A panel of immunohistochemistry (p16, f, p53, PAX 8, SMA, CD10, CD45, CD 99, CD117, CD138, cytokeratin 7, cyclin D1, cytokeratin AE1/3, EMA and GATA3) is performed to better classify this neoplasm.  By immunohistochemistry, the neoplastic cells are positive for p16, p53 and  PAX 8.  The epithelioid cells are positive for cytokeratin AE1/3 and EMA while the rhabdoid cells show positivity for desmin. Overall, the immunophenotype and morphology supports the diagnosis of carcinosarcoma.    ONCOLOGY TABLE:   UTERUS, CARCINOMA OR CARCINOSARCOMA   Procedure: Total hysterectomy and bilateral salpingo-oophorectomy  Histologic type: Carcinosarcoma  Histologic Grade: FIGO grade 3  Myometrial invasion: Estimated to be less than 50%  Uterine Serosa Involvement: Not identified  Cervical stromal involvement: Not identified  Extent of involvement of other organs: Not identified  Lymphovascular invasion: Not identified  Regional Lymph Nodes:       Examined:       2 Sentinel  3 non-sentinel                               5 total        Lymph nodes with metastasis: 0        Isolated tumor cells (<0.2 mm): 0        Micrometastasis:  (>0.2 mm and < 2.0 mm): 0        Macrometastasis: (>2.0 mm): 0        Extracapsular extension: N/A  Representative Tumor Block: B1  MMR / MSI testing: Will be ordered  Pathologic Stage Classification (pTNM, AJCC 8th edition):  pT1a, pN0  Comments: See above    02/04/2020 Cancer Staging   Staging form: Corpus Uteri - Carcinoma and Carcinosarcoma, AJCC 8th Edition - Clinical stage from 02/04/2020: FIGO  Stage IA (cT1a, cN0(sn), cM0) - Signed by Lafonda Mosses, MD on 02/12/2020    She is doing well after surgery Her abdomen is healing well Her appetite is stable She has mild chronic constipation Her diabetes is well controlled She denies peripheral neuropathy  MEDICAL HISTORY:  Past Medical History:  Diagnosis Date  . Adenomatous colon polyp 11/2005  . Anginal pain ([place])    last time 2016  . Anxiety   . Arthritis   . CAD (coronary artery disease)    2V CABG 1996 (LIMA to LAD, SVG to RCA) Dr. Servando Snare.; last stress test in 2010  . Diabetes mellitus without complication ([place])    type II  . GERD (gastroesophageal reflux disease)   . Hyperlipidemia   . Hypertension   . Muscle cramps    and pains  . Myocardial infarction ([place])    1996  . Pneumonia    hx of x 2   . Ulcerative colitis   . Uterine cancer [facility])     SURGICAL HISTORY: Past Surgical History:  Procedure Laterality Date  . BACK SURGERY     lumbar fusion 25 years ago and ruptured disk 10 years ago same area  . BACK SURGERY     ruptered disc under lumbar disc  . CARDIAC CATHETERIZATION N/A 10/13/2014   Procedure: Left Heart Cath And Coronary Angiography;  Surgeon: Burnell Blanks, MD;  Location: [place] CV [facility] CUPID;  Service: Cardiovascular;  Laterality: N/A;  . CARDIAC CATHETERIZATION N/A 10/13/2014   Procedure: Coronary Stent Intervention;  Surgeon: Burnell Blanks, MD;  Location: [place] INVASIVE CV LAB CUPID;  Service: Cardiovascular;  Laterality: N/A;  . COLONOSCOPY    . CORONARY ANGIOPLASTY WITH STENT PLACEMENT  10/13/2014   DES to [place]  . CORONARY ARTERY BYPASS GRAFT  1996   x 2  . DILATATION & CURETTAGE/HYSTEROSCOPY WITH MYOSURE N/A 01/03/2020   Procedure: DILATATION & CURETTAGE/HYSTEROSCOPY WITH POSSIBLE MYOSURE;  Surgeon: Paula Compton, MD;  Location: [place];  Service: Gynecology;  Laterality: N/A;  . DILATION AND EVACUATION N/A 01/03/2020   Procedure: DILATATION AND EVACUATION;   Surgeon: Paula Compton, MD;  Location: [place];  Service: Gynecology;  Laterality: N/A;  . OPERATIVE ULTRASOUND N/A 01/03/2020   Procedure: OPERATIVE ULTRASOUND;  Surgeon: Paula Compton, MD;  Location: [place];  Service: Gynecology;  Laterality: N/A;  . POLYPECTOMY    . ROBOTIC ASSISTED TOTAL HYSTERECTOMY WITH BILATERAL SALPINGO OOPHERECTOMY Bilateral 02/04/2020   Procedure: XI ROBOTIC ASSISTED TOTAL HYSTERECTOMY WITH BILATERAL SALPINGO OOPHORECTOMY, LYMPH NODE DISSECTION;  Surgeon: Lafonda Mosses, MD;  Location: [facility];  Service: Gynecology;  Laterality: Bilateral;  . SENTINEL NODE BIOPSY N/A 02/04/2020   Procedure: SENTINEL NODE BIOPSY;  Surgeon: Lafonda Mosses, MD;  Location: [facility];  Service: Gynecology;  Laterality: N/A;  . UPPER GASTROINTESTINAL ENDOSCOPY      SOCIAL HISTORY: Social History   Socioeconomic History  . Marital status: Widowed    Spouse name: Ray  . Number of children: 7  . Years of education: 22  . Highest education level: Not on file  Occupational History  . Occupation: retired  Tobacco Use  . Smoking status: Former Smoker    Years: 20.00    Quit date: 10/04/1994    Years since quitting: 25.4  . Smokeless tobacco: Never Used  Vaping Use  . Vaping Use: Never used  Substance and Sexual Activity  . Alcohol use: No  . Drug use: No  . Sexual activity: Not Currently    Partners: Male  Other Topics Concern  . Not on file  Social History Narrative  . Not on file   Social Determinants of Health   Financial Resource Strain:   . Difficulty of Paying Living Expenses: Not on file  Food Insecurity:   . Worried About Charity fundraiser in the Last Year: Not on file  . Ran Out of Food in the Last Year: Not on file  Transportation Needs:   . Lack of Transportation (Medical): Not on file  . Lack of Transportation (Non-Medical): Not on file  Physical Activity:   . Days of Exercise per Week: Not on file  . Minutes of Exercise per Session: Not on file   Stress:   . Feeling of Stress : Not on file  Social Connections:   . Frequency of Communication with Friends and Family: Not on file  . Frequency of Social Gatherings with Friends and Family: Not on file  . Attends Religious Services: Not on file  . Active Member of Clubs or Organizations: Not on file  . Attends Archivist Meetings: Not on file  . Marital Status: Not on file  Intimate Partner Violence:   . Fear of Current or Ex-Partner: Not on file  . Emotionally Abused: Not on file  . Physically Abused: Not on file  . Sexually Abused: Not on file    FAMILY HISTORY: Family History  Problem Relation Age of Onset  . Diabetes Mother   . Heart disease Mother   . Heart attack Mother 19  . Hypertension Mother   . Lung cancer Brother   . Colon cancer Brother   . Breast cancer Sister   . Kidney disease Brother        renal cancer  . Heart disease Sister   . Colon cancer Daughter        Thinks her daughter had negative genetic testing  . Hypertension Sister   . Stroke Sister   . Pancreatitis Other   . Breast cancer Daughter   . Stomach cancer Neg Hx     ALLERGIES:  has No Known Allergies.  MEDICATIONS:  Current Outpatient Medications  Medication Sig Dispense Refill  . aspirin EC 81 MG tablet Take 81 mg by mouth daily. Swallow whole.     . balsalazide (COLAZAL) 750 MG capsule TAKE 2 CAPSULES (1,500 MG TOTAL) BY MOUTH 2 (TWO) TIMES DAILY. TAKE 2 CAPSULES TWO TIMES DAILY (Patient taking differently: Take 1,500 mg by mouth 2 (two) times daily. ) 360 capsule 0  . calcium carbonate (CALCIUM 600) 600 MG TABS tablet Take 600 mg by  mouth daily with breakfast.    . carvedilol (COREG) 3.125 MG tablet TAKE 1 TABLET (3.125 MG TOTAL) BY MOUTH 2 (TWO) TIMES DAILY. 180 tablet 2  . Cholecalciferol (VITAMIN D) 50 MCG (2000 UT) tablet Take 2,000 Units by mouth daily.     . clopidogrel (PLAVIX) 75 MG tablet Take 75 mg by mouth daily. Last dose prior to D and C on 01-03-20  (Patient not  taking: Reported on 02/25/2020)    . dexamethasone (DECADRON) 4 MG tablet Take 2 tabs at the night before and 2 tab the morning of chemotherapy, every 3 weeks, by mouth x 6 cycles 36 tablet 6  . docusate sodium (COLACE) 100 MG capsule Take 100 mg by mouth daily.     Marland Kitchen FLUoxetine (PROZAC) 10 MG capsule Take 10 mg by mouth daily.     Marland Kitchen lidocaine-prilocaine (EMLA) cream Apply to affected area once 30 g 3  . LORazepam (ATIVAN) 0.5 MG tablet Take 0.5 mg by mouth as needed for anxiety (for anxiety). Reported on 12/04/2015    . losartan-hydrochlorothiazide (HYZAAR) 100-12.5 MG tablet Take 1 tablet by mouth daily.    . metFORMIN (GLUCOPHAGE-XR) 500 MG 24 hr tablet Take 500 mg by mouth daily with supper.   5  . Multiple Vitamins-Minerals (MULTIVITAMIN WITH MINERALS) tablet Take 1 tablet by mouth daily.      . nitroGLYCERIN (NITROSTAT) 0.4 MG SL tablet Place 1 tablet (0.4 mg total) under the tongue every 5 (five) minutes as needed for chest pain. 25 tablet 6  . ondansetron (ZOFRAN) 8 MG tablet Take 1 tablet (8 mg total) by mouth every 8 (eight) hours as needed. 30 tablet 1  . pantoprazole (PROTONIX) 40 MG tablet Take 1 tablet (40 mg total) by mouth daily. Must keep upcoming appt in Nov for future refills 90 tablet 0  . prochlorperazine (COMPAZINE) 10 MG tablet Take 1 tablet (10 mg total) by mouth every 6 (six) hours as needed (Nausea or vomiting). 30 tablet 1  . simvastatin (ZOCOR) 40 MG tablet Take 40 mg by mouth every evening.   1   No current facility-administered medications for this visit.    REVIEW OF SYSTEMS:   Constitutional: Denies fevers, chills or abnormal night sweats Eyes: Denies blurriness of vision, double vision or watery eyes Ears, nose, mouth, throat, and face: Denies mucositis or sore throat Respiratory: Denies cough, dyspnea or wheezes Cardiovascular: Denies palpitation, chest discomfort or lower extremity swelling Skin: Denies abnormal skin rashes Lymphatics: Denies new  lymphadenopathy or easy bruising Neurological:Denies numbness, tingling or new weaknesses Behavioral/Psych: Mood is stable, no new changes  All other systems were reviewed with the patient and are negative.  PHYSICAL EXAMINATION: ECOG PERFORMANCE STATUS: 1 - Symptomatic but completely ambulatory  Vitals:   02/27/20 1050  BP: (!) 144/62  Pulse: 76  Resp: 18  Temp: 97.6 F (36.4 C)  SpO2: 98%   Filed Weights   02/27/20 1050  Weight: 172 lb 4.8 oz (78.2 kg)    GENERAL:alert, no distress and comfortable SKIN: skin color, texture, turgor are normal, no rashes or significant lesions EYES: normal, conjunctiva are pink and non-injected, sclera clear OROPHARYNX:no exudate, no erythema and lips, buccal mucosa, and tongue normal  NECK: supple, thyroid normal size, non-tender, without nodularity LYMPH:  no palpable lymphadenopathy in the cervical, axillary or inguinal LUNGS: clear to auscultation and percussion with normal breathing effort HEART: regular rate & rhythm and no murmurs and no lower extremity edema ABDOMEN:abdomen soft, non-tender and normal bowel sounds.  Noted well-healed surgical scar  musculoskeletal:no cyanosis of digits and no clubbing  PSYCH: alert & oriented x 3 with fluent speech NEURO: no focal motor/sensory deficits  LABORATORY DATA:  I have reviewed the data as listed Lab Results  Component Value Date   WBC 5.0 02/04/2020   HGB 11.4 (L) 02/04/2020   HCT 34.4 (L) 02/04/2020   MCV 93.0 02/04/2020   PLT 256 02/04/2020   Recent Labs    01/03/20 0614 01/20/20 1334 02/04/20 0605  NA 139 143 137  K 3.6 4.2 3.3*  CL 100 102 103  CO2 _0 GLUCOSE 128* 139* 136*  BUN 7* 17 9  CREATININE 0.82 0.99 0.80  CALCIUM 9.2 9.3 8.7*  GFRNONAA >60 57* >60  GFRAA >60 >60 >60  PROT  --  7.4  --   ALBUMIN  --  4.1  --   AST  --  29  --   ALT  --  21  --   ALKPHOS  --  57  --   BILITOT  --  0.8  --     RADIOGRAPHIC STUDIES: I have reviewed his CT  imaging I have personally reviewed the radiological images as listed and agreed with the findings in the report.

## 2020-03-04 ENCOUNTER — Inpatient Hospital Stay: Payer: PPO

## 2020-03-04 ENCOUNTER — Other Ambulatory Visit: Payer: Self-pay

## 2020-03-05 ENCOUNTER — Other Ambulatory Visit (HOSPITAL_COMMUNITY): Payer: PPO

## 2020-03-05 ENCOUNTER — Telehealth: Payer: Self-pay | Admitting: Oncology

## 2020-03-05 ENCOUNTER — Other Ambulatory Visit: Payer: Self-pay | Admitting: Radiology

## 2020-03-05 NOTE — Telephone Encounter (Signed)
[name] [name] called and wanted to verify when she needs to be NPO for her port placement tomorrow.  Advised her she needs to be NPO after 8 am tomorrow morning.  She verbalized understanding and agreement.

## 2020-03-06 ENCOUNTER — Ambulatory Visit (HOSPITAL_COMMUNITY)
Admission: RE | Admit: 2020-03-06 | Discharge: 2020-03-06 | Disposition: A | Discharge status: Home / Self Care | Payer: PPO | Source: Ambulatory Visit | Attending: Hematology and Oncology | Admitting: Hematology and Oncology

## 2020-03-06 ENCOUNTER — Encounter (HOSPITAL_COMMUNITY): Payer: Self-pay

## 2020-03-06 ENCOUNTER — Other Ambulatory Visit: Payer: Self-pay

## 2020-03-06 DIAGNOSIS — Z7982 Long term (current) use of aspirin: Secondary | ICD-10-CM | POA: Insufficient documentation

## 2020-03-06 DIAGNOSIS — C55 Malignant neoplasm of uterus, part unspecified: Secondary | ICD-10-CM | POA: Diagnosis not present

## 2020-03-06 DIAGNOSIS — E119 Type 2 diabetes mellitus without complications: Secondary | ICD-10-CM | POA: Insufficient documentation

## 2020-03-06 DIAGNOSIS — Z79899 Other long term (current) drug therapy: Secondary | ICD-10-CM | POA: Insufficient documentation

## 2020-03-06 DIAGNOSIS — K219 Gastro-esophageal reflux disease without esophagitis: Secondary | ICD-10-CM | POA: Insufficient documentation

## 2020-03-06 DIAGNOSIS — C539 Malignant neoplasm of cervix uteri, unspecified: Secondary | ICD-10-CM | POA: Diagnosis not present

## 2020-03-06 DIAGNOSIS — Z01812 Encounter for preprocedural laboratory examination: Secondary | ICD-10-CM | POA: Insufficient documentation

## 2020-03-06 DIAGNOSIS — Z87891 Personal history of nicotine dependence: Secondary | ICD-10-CM | POA: Diagnosis not present

## 2020-03-06 DIAGNOSIS — I2510 Atherosclerotic heart disease of native coronary artery without angina pectoris: Secondary | ICD-10-CM | POA: Insufficient documentation

## 2020-03-06 DIAGNOSIS — Z7901 Long term (current) use of anticoagulants: Secondary | ICD-10-CM | POA: Insufficient documentation

## 2020-03-06 DIAGNOSIS — I252 Old myocardial infarction: Secondary | ICD-10-CM | POA: Diagnosis not present

## 2020-03-06 DIAGNOSIS — F419 Anxiety disorder, unspecified: Secondary | ICD-10-CM | POA: Insufficient documentation

## 2020-03-06 DIAGNOSIS — Z452 Encounter for adjustment and management of vascular access device: Secondary | ICD-10-CM | POA: Insufficient documentation

## 2020-03-06 DIAGNOSIS — E785 Hyperlipidemia, unspecified: Secondary | ICD-10-CM | POA: Insufficient documentation

## 2020-03-06 DIAGNOSIS — I10 Essential (primary) hypertension: Secondary | ICD-10-CM | POA: Diagnosis not present

## 2020-03-06 DIAGNOSIS — Z8616 Personal history of COVID-19: Secondary | ICD-10-CM | POA: Insufficient documentation

## 2020-03-06 HISTORY — PX: IR IMAGING GUIDED PORT INSERTION: IMG5740

## 2020-03-06 LAB — CBC WITH DIFFERENTIAL/PLATELET
Abs Immature Granulocytes: 0.01 10*3/uL (ref 0.00–0.07)
Basophils Absolute: 0 10*3/uL (ref 0.0–0.1)
Basophils Relative: 0 %
Eosinophils Absolute: 0.4 10*3/uL (ref 0.0–0.5)
Eosinophils Relative: 7 %
HCT: 35.1 % — ABNORMAL LOW (ref 36.0–46.0)
Hemoglobin: 11.6 g/dL — ABNORMAL LOW (ref 12.0–15.0)
Immature Granulocytes: 0 %
Lymphocytes Relative: 29 %
Lymphs Abs: 1.8 10*3/uL (ref 0.7–4.0)
MCH: 30.5 pg (ref 26.0–34.0)
MCHC: 33 g/dL (ref 30.0–36.0)
MCV: 92.4 fL (ref 80.0–100.0)
Monocytes Absolute: 0.4 10*3/uL (ref 0.1–1.0)
Monocytes Relative: 7 %
Neutro Abs: 3.5 10*3/uL (ref 1.7–7.7)
Neutrophils Relative %: 57 %
Platelets: 207 10*3/uL (ref 150–400)
RBC: 3.8 MIL/uL — ABNORMAL LOW (ref 3.87–5.11)
RDW: 14 % (ref 11.5–15.5)
WBC: 6.3 10*3/uL (ref 4.0–10.5)
nRBC: 0 % (ref 0.0–0.2)

## 2020-03-06 LAB — BASIC METABOLIC PANEL
Anion gap: 11 (ref 5–15)
BUN: 12 mg/dL (ref 8–23)
CO2: 28 mmol/L (ref 22–32)
Calcium: 8.9 mg/dL (ref 8.9–10.3)
Chloride: 102 mmol/L (ref 98–111)
Creatinine, Ser: 0.77 mg/dL (ref 0.44–1.00)
GFR calc Af Amer: >60 mL/min (ref >60)
GFR calc non Af Amer: >60 mL/min (ref >60)
Glucose, Bld: 105 mg/dL — ABNORMAL HIGH (ref 70–99)
Potassium: 3.5 mmol/L (ref 3.5–5.1)
Sodium: 141 mmol/L (ref 135–145)

## 2020-03-06 LAB — PROTIME-INR
INR: 1 (ref 0.8–1.2)
Prothrombin Time: 13 seconds (ref 11.4–15.2)

## 2020-03-06 LAB — GLUCOSE, CAPILLARY: Glucose-Capillary: 103 mg/dL — ABNORMAL HIGH (ref 70–99)

## 2020-03-06 MED ORDER — FENTANYL CITRATE (PF) 100 MCG/2ML IJ SOLN
INTRAMUSCULAR | Status: DC
Start: 2020-03-06 — End: 2020-03-07
  Filled 2020-03-06: qty 2

## 2020-03-06 MED ORDER — LIDOCAINE HCL 1 % IJ SOLN
INTRAMUSCULAR | Status: AC
  Filled 2020-03-06: qty 20

## 2020-03-06 MED ORDER — FENTANYL CITRATE (PF) 100 MCG/2ML IJ SOLN
INTRAMUSCULAR | Status: AC | PRN
  Administered 2020-03-06 (×2): 50 ug via INTRAVENOUS

## 2020-03-06 MED ORDER — MIDAZOLAM HCL 2 MG/2ML IJ SOLN
INTRAMUSCULAR | Status: AC | PRN
  Administered 2020-03-06 (×2): 1 mg via INTRAVENOUS

## 2020-03-06 MED ORDER — CEFAZOLIN SODIUM-DEXTROSE 2-4 GM/100ML-% IV SOLN: 2.0000 g | INTRAVENOUS | Status: AC

## 2020-03-06 MED ORDER — HEPARIN SOD (PORK) LOCK FLUSH 100 UNIT/ML IV SOLN
INTRAVENOUS | Status: AC
  Filled 2020-03-06: qty 5

## 2020-03-06 MED ORDER — SODIUM CHLORIDE 0.9 % IV SOLN: INTRAVENOUS | Status: DC

## 2020-03-06 MED ORDER — CEFAZOLIN SODIUM-DEXTROSE 2-4 GM/100ML-% IV SOLN
INTRAVENOUS | Status: AC
  Administered 2020-03-06: 2 g via INTRAVENOUS
  Filled 2020-03-06: qty 100

## 2020-03-06 MED ORDER — MIDAZOLAM HCL 2 MG/2ML IJ SOLN
INTRAMUSCULAR | Status: AC
  Filled 2020-03-06: qty 4

## 2020-03-06 NOTE — Procedures (Signed)
Interventional Radiology Procedure Note  Procedure: Single Lumen Power Port Placement    Access:  Right IJ vein.  Findings: Catheter tip positioned at SVC/RA junction. Port is ready for immediate use.   Complications: None  EBL: < 10 mL  Recommendations:  - Ok to shower in 24 hours - Do not submerge for 7 days - Routine line care   Ollis Daudelin T. Kathlene Cote, M.D Pager:  [phone]

## 2020-03-06 NOTE — Discharge Instructions (Signed)
Urgent needs - IR on call MD [phone]  Wound - May remove dressing and shower in 24 to 48 hours.  Keep site clean and dry.  Replace with bandaid. Do not submerge in tub or water until site healing well.  If ordered by your provider, may start Emla cream in 2 weeks or after incision is healed.  After completion of treatment, your provider should have you set up for monthly port flushes.    Implanted Port Insertion, Care After This sheet gives you information about how to care for yourself after your procedure. Your health care provider may also give you more specific instructions. If you have problems or questions, contact your health care provider. What can I expect after the procedure? After the procedure, it is common to have:  Discomfort at the port insertion site.  Bruising on the skin over the port. This should improve over 3-4 days. Follow these instructions at home: [facility] care  After your port is placed, you will get a manufacturer's information card. The card has information about your port. Keep this card with you at all times.  Take care of the port as told by your health care provider. Ask your health care provider if you or a family member can get training for taking care of the port at home. A home health care nurse may also take care of the port.  Make sure to remember what type of port you have. Incision care  Follow instructions from your health care provider about how to take care of your port insertion site. Make sure you: ? Wash your hands with soap and water before and after you change your bandage (dressing). If soap and water are not available, use hand sanitizer. ? Change your dressing as told by your health care provider. ? Leave stitches (sutures), skin glue, or adhesive strips in place. These skin closures may need to stay in place for 2 weeks or longer. If adhesive strip edges start to loosen and curl up, you may trim the loose edges. Do not remove adhesive  strips completely unless your health care provider tells you to do that.  Check your port insertion site every day for signs of infection. Check for: ? Redness, swelling, or pain. ? Fluid or blood. ? Warmth. ? Pus or a bad smell. Activity  Return to your normal activities as told by your health care provider. Ask your health care provider what activities are safe for you.  Do not lift anything that is heavier than 10 lb (4.5 kg), or the limit that you are told, until your health care provider says that it is safe. General instructions  Take over-the-counter and prescription medicines only as told by your health care provider.  Do not take baths, swim, or use a hot tub until your health care provider approves. Ask your health care provider if you may take showers. You may only be allowed to take sponge baths.  Do not drive for 24 hours if you were given a sedative during your procedure.  Wear a medical alert bracelet in case of an emergency. This will tell any health care providers that you have a port.  Keep all follow-up visits as told by your health care provider. This is important. Contact a health care provider if:  You cannot flush your port with saline as directed, or you cannot draw blood from the port.  You have a fever or chills.  You have redness, swelling, or pain around your port  insertion site.  You have fluid or blood coming from your port insertion site.  Your port insertion site feels warm to the touch.  You have pus or a bad smell coming from the port insertion site. Get help right away if:  You have chest pain or shortness of breath.  You have bleeding from your port that you cannot control. Summary  Take care of the port as told by your health care provider. Keep the manufacturer's information card with you at all times.  Change your dressing as told by your health care provider.  Contact a health care provider if you have a fever or chills or if you  have redness, swelling, or pain around your port insertion site.  Keep all follow-up visits as told by your health care provider. This information is not intended to replace advice given to you by your health care provider. Make sure you discuss any questions you have with your health care provider. Document Revised: 12/26/2017 Document Reviewed: 12/26/2017 Elsevier Patient Education  [place].   Moderate Conscious Sedation, Adult, Care After These instructions provide you with information about caring for yourself after your procedure. Your health care provider may also give you more specific instructions. Your treatment has been planned according to current medical practices, but problems sometimes occur. Call your health care provider if you have any problems or questions after your procedure. What can I expect after the procedure? After your procedure, it is common:  To feel sleepy for several hours.  To feel clumsy and have poor balance for several hours.  To have poor judgment for several hours.  To vomit if you eat too soon. Follow these instructions at home: For at least 24 hours after the procedure:  Do not: ? Participate in activities where you could fall or become injured. ? Drive. ? Use heavy machinery. ? Drink alcohol. ? Take sleeping pills or medicines that cause drowsiness. ? Make important decisions or sign legal documents. ? Take care of children on your own.  Rest. Eating and drinking  Follow the diet recommended by your health care provider.  If you vomit: ? Drink water, juice, or soup when you can drink without vomiting. ? Make sure you have little or no nausea before eating solid foods. General instructions  Have a responsible adult stay with you until you are awake and alert.  Take over-the-counter and prescription medicines only as told by your health care provider.  If you smoke, do not smoke without supervision.  Keep all follow-up  visits as told by your health care provider. This is important. Contact a health care provider if:  You keep feeling nauseous or you keep vomiting.  You feel light-headed.  You develop a rash.  You have a fever. Get help right away if:  You have trouble breathing. This information is not intended to replace advice given to you by your health care provider. Make sure you discuss any questions you have with your health care provider. Document Revised: 05/12/2017 Document Reviewed: 09/19/2015 Elsevier Patient Education  2020 [employer].

## 2020-03-06 NOTE — Consult Note (Signed)
Chief Complaint: Patient was seen in consultation today for Port-A-Cath placement  Referring Physician(s): [place]  Supervising Physician: Aletta Edouard  Patient Status: [facility] - Out-pt  History of Present Illness: [name] [name] is a 74 y.o. 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  She presents today for Port-A-Cath placement for chemotherapy.  Patient did have COVID-19 in August of this year and has received vaccination as well as monoclonal antibody infusion.   Past Medical History:  Diagnosis Date  . Adenomatous colon polyp 11/2005  . Anginal pain ([place])    last time 2016  . Anxiety   . Arthritis   . CAD (coronary artery disease)    2V CABG 1996 (LIMA to LAD, SVG to RCA) Dr. Servando Snare.; last stress test in 2010  . Diabetes mellitus without complication ([place])    type II  . GERD (gastroesophageal reflux disease)   . Hyperlipidemia   . Hypertension   . Muscle cramps    and pains  . Myocardial infarction ([place])    1996  . Pneumonia    hx of x 2   . Ulcerative colitis   . Uterine cancer [facility])     Past Surgical History:  Procedure Laterality Date  . BACK SURGERY     lumbar fusion 25 years ago and ruptured disk 10 years ago same area  . BACK SURGERY     ruptered disc under lumbar disc  . CARDIAC CATHETERIZATION N/A 10/13/2014   Procedure: Left Heart Cath And Coronary Angiography;  Surgeon: Burnell Blanks, MD;  Location: [place] [facility];  Service: Cardiovascular;  Laterality: N/A;  . CARDIAC CATHETERIZATION N/A 10/13/2014   Procedure: Coronary Stent Intervention;  Surgeon: Burnell Blanks, MD;  Location: [place] INVASIVE CV LAB CUPID;  Service: Cardiovascular;  Laterality: N/A;  . COLONOSCOPY    . CORONARY ANGIOPLASTY WITH STENT PLACEMENT  10/13/2014   DES to [place]  . CORONARY ARTERY BYPASS GRAFT  1996   x 2  . DILATATION & CURETTAGE/HYSTEROSCOPY WITH MYOSURE N/A  01/03/2020   Procedure: DILATATION & CURETTAGE/HYSTEROSCOPY WITH POSSIBLE MYOSURE;  Surgeon: Paula Compton, MD;  Location: [place];  Service: Gynecology;  Laterality: N/A;  . DILATION AND EVACUATION N/A 01/03/2020   Procedure: DILATATION AND EVACUATION;  Surgeon: Paula Compton, MD;  Location: [place];  Service: Gynecology;  Laterality: N/A;  . OPERATIVE ULTRASOUND N/A 01/03/2020   Procedure: OPERATIVE ULTRASOUND;  Surgeon: Paula Compton, MD;  Location: [place];  Service: Gynecology;  Laterality: N/A;  . POLYPECTOMY    . ROBOTIC ASSISTED TOTAL HYSTERECTOMY WITH BILATERAL SALPINGO OOPHERECTOMY Bilateral 02/04/2020   Procedure: XI ROBOTIC ASSISTED TOTAL HYSTERECTOMY WITH BILATERAL SALPINGO OOPHORECTOMY, LYMPH NODE DISSECTION;  Surgeon: Lafonda Mosses, MD;  Location: [facility];  Service: Gynecology;  Laterality: Bilateral;  . SENTINEL NODE BIOPSY N/A 02/04/2020   Procedure: SENTINEL NODE BIOPSY;  Surgeon: Lafonda Mosses, MD;  Location: [facility];  Service: Gynecology;  Laterality: N/A;  . UPPER GASTROINTESTINAL ENDOSCOPY      Allergies: Patient has no known allergies.  Medications: Prior to Admission medications   Medication Sig Start Date End Date Taking? Authorizing Provider  aspirin EC 81 MG tablet Take 81 mg by mouth daily. Swallow whole.    Yes [provider]  balsalazide (COLAZAL) 750 MG capsule TAKE 2 CAPSULES (1,500 MG TOTAL) BY MOUTH 2 (TWO) TIMES DAILY. TAKE 2 CAPSULES TWO TIMES DAILY Patient taking differently: Take  1,500 mg by mouth 2 (two) times daily.  12/19/19  Yes Ladene Artist, MD  calcium carbonate (CALCIUM 600) 600 MG TABS tablet Take 600 mg by mouth daily with breakfast.   Yes [provider]  carvedilol (COREG) 3.125 MG tablet TAKE 1 TABLET (3.125 MG TOTAL) BY MOUTH 2 (TWO) TIMES DAILY. 01/23/20  Yes Burnell Blanks, MD  Cholecalciferol (VITAMIN D) 50 MCG (2000 UT) tablet Take 2,000 Units by mouth daily.    Yes [provider]    FLUoxetine (PROZAC) 10 MG capsule Take 10 mg by mouth daily.  12/04/13  Yes [provider]  LORazepam (ATIVAN) 0.5 MG tablet Take 0.5 mg by mouth as needed for anxiety (for anxiety). Reported on 12/04/2015 06/30/11  Yes [provider]  losartan-hydrochlorothiazide (HYZAAR) 100-12.5 MG tablet Take 1 tablet by mouth daily. 07/24/19  Yes [provider]  metFORMIN (GLUCOPHAGE-XR) 500 MG 24 hr tablet Take 500 mg by mouth daily with supper.  05/30/17  Yes [provider]  simvastatin (ZOCOR) 40 MG tablet Take 40 mg by mouth every evening.  05/30/17  Yes [provider]  clopidogrel (PLAVIX) 75 MG tablet Take 75 mg by mouth daily. Last dose prior to D and C on 01-03-20  Patient not taking: Reported on 02/25/2020    [provider]  dexamethasone (DECADRON) 4 MG tablet Take 2 tabs at the night before and 2 tab the morning of chemotherapy, every 3 weeks, by mouth x 6 cycles 02/27/20   Heath Lark, MD  docusate sodium (COLACE) 100 MG capsule Take 100 mg by mouth daily.     [provider]  lidocaine-prilocaine (EMLA) cream Apply to affected area once 02/27/20   Heath Lark, MD  Multiple Vitamins-Minerals (MULTIVITAMIN WITH MINERALS) tablet Take 1 tablet by mouth daily.      [provider]  nitroGLYCERIN (NITROSTAT) 0.4 MG SL tablet Place 1 tablet (0.4 mg total) under the tongue every 5 (five) minutes as needed for chest pain. 01/12/18 01/14/20  Burnell Blanks, MD  ondansetron (ZOFRAN) 8 MG tablet Take 1 tablet (8 mg total) by mouth every 8 (eight) hours as needed. 02/27/20   Heath Lark, MD  pantoprazole (PROTONIX) 40 MG tablet Take 1 tablet (40 mg total) by mouth daily. Must keep upcoming appt in Nov for future refills 02/26/20   Burnell Blanks, MD  prochlorperazine (COMPAZINE) 10 MG tablet Take 1 tablet (10 mg total) by mouth every 6 (six) hours as needed (Nausea or vomiting). 02/27/20   Heath Lark, MD     Family History   Problem Relation Age of Onset  . Diabetes Mother   . Heart disease Mother   . Heart attack Mother 56  . Hypertension Mother   . Lung cancer Brother   . Colon cancer Brother   . Breast cancer Sister   . Kidney disease Brother        renal cancer  . Heart disease Sister   . Colon cancer Daughter        Thinks her daughter had negative genetic testing  . Hypertension Sister   . Stroke Sister   . Pancreatitis Other   . Breast cancer Daughter   . Stomach cancer Neg Hx     Social History   Socioeconomic History  . Marital status: Widowed    Spouse name: Ray  . Number of children: 7  . Years of education: 57  . Highest education level: Not on file  Occupational History  .  Occupation: retired  Tobacco Use  . Smoking status: Former Smoker    Years: 20.00    Quit date: 10/04/1994    Years since quitting: 25.4  . Smokeless tobacco: Never Used  Vaping Use  . Vaping Use: Never used  Substance and Sexual Activity  . Alcohol use: No  . Drug use: No  . Sexual activity: Not Currently    Partners: Male  Other Topics Concern  . Not on file  Social History Narrative  . Not on file   Social Determinants of Health   Financial Resource Strain:   . Difficulty of Paying Living Expenses: Not on file  Food Insecurity:   . Worried About Charity fundraiser in the Last Year: Not on file  . Ran Out of Food in the Last Year: Not on file  Transportation Needs:   . Lack of Transportation (Medical): Not on file  . Lack of Transportation (Non-Medical): Not on file  Physical Activity:   . Days of Exercise per Week: Not on file  . Minutes of Exercise per Session: Not on file  Stress:   . Feeling of Stress : Not on file  Social Connections:   . Frequency of Communication with Friends and Family: Not on file  . Frequency of Social Gatherings with Friends and Family: Not on file  . Attends Religious Services: Not on file  . Active Member of Clubs or Organizations: Not on file  . Attends  Archivist Meetings: Not on file  . Marital Status: Not on file      Review of Systems currently denies fever, headache, chest pain, dyspnea, cough, back pain, nausea, vomiting or bleeding.  He does have some mild abdominal tenderness from recent surgery  Vital Signs: BP (!) 155/76 (BP Location: Right Arm)   Pulse 76   Temp 98.2 F (36.8 C) (Oral)   Resp 16   Wt 172 lb 4.8 oz (78.2 kg)   SpO2 97%   BMI 27.81 kg/m   Physical Exam awake, alert.  Chest clear to auscultation bilaterally.  Heart with regular rate and rhythm.  Abdomen soft, positive bowel sounds, minimal tenderness to palpation.  No lower extremity edema.  Imaging: No results found.  Labs:  CBC: Recent Labs    01/03/20 0614 01/20/20 1334 02/04/20 0605 03/06/20 1340  WBC 7.8 5.0 5.0 6.3  HGB 11.1* 12.5 11.4* 11.6*  HCT 34.2* 38.2 34.4* 35.1*  PLT 291 280 256 207    COAGS: No results for input(s): INR, APTT in the last 8760 hours.  BMP: Recent Labs    01/03/20 0614 01/20/20 1334 02/04/20 0605  NA 139 143 137  K 3.6 4.2 3.3*  CL 100 102 103  CO2 29 31 24   GLUCOSE 128* 139* 136*  BUN 7* 17 9  CALCIUM 9.2 9.3 8.7*  CREATININE 0.82 0.99 0.80  GFRNONAA >60 57* >60  GFRAA >60 >60 >60    LIVER FUNCTION TESTS: Recent Labs    01/20/20 1334  BILITOT 0.8  AST 29  ALT 21  ALKPHOS 57  PROT 7.4  ALBUMIN 4.1    TUMOR MARKERS: No results for input(s): AFPTM, CEA, CA199, CHROMGRNA in the last 8760 hours.  Assessment and Plan: 74 y.o. 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  She presents today for Port-A-Cath placement for chemotherapy.  Patient did have COVID-19 in August of this year and has received vaccination as well  as monoclonal antibody infusion. Risks and benefits of image guided port-a-catheter placement was discussed with the patient including, but not limited to bleeding, infection, pneumothorax, or  fibrin sheath development and need for additional procedures.  All of the patient's questions were answered, patient is agreeable to proceed. Consent signed and in chart.      Thank you for this interesting consult.  I greatly enjoyed meeting [name] [name] and look forward to participating in their care.  A copy of this report was sent to the requesting provider on this date.  Electronically Signed: D. Rowe Robert, PA-C 03/06/2020, 2:13 PM   I spent a total of  25 minutes   in face to face in clinical consultation, greater than 50% of which was counseling/coordinating care for Port-A-Cath placement

## 2020-03-06 NOTE — Progress Notes (Signed)
Pharmacist Chemotherapy Monitoring - Initial Assessment    Anticipated start date: 03/12/20  Regimen:  . Are orders appropriate based on the patient's diagnosis, regimen, and cycle? Yes . Does the plan date match the patient's scheduled date? Yes . Is the sequencing of drugs appropriate? Yes . Are the premedications appropriate for the patient's regimen? Yes . Prior Authorization for treatment is: Approved o If applicable, is the correct biosimilar selected based on the patient's insurance? not applicable  Organ Function and Labs: Mar[place] Kitchen Are dose adjustments needed based on the patient's renal function, hepatic function, or hematologic function? Yes . Are appropriate labs ordered prior to the start of patient's treatment? Yes . Other organ system assessment, if indicated: N/A . The following baseline labs, if indicated, have been ordered: N/A  Dose Assessment: . Are the drug doses appropriate? Yes . Are the following correct: o Drug concentrations Yes o IV fluid compatible with drug Yes o Administration routes Yes o Timing of therapy Yes . If applicable, does the patient have documented access for treatment and/or plans for port-a-cath placement? yes . If applicable, have lifetime cumulative doses been properly documented and assessed? not applicable Lifetime Dose Tracking  No doses have been documented on this patient for the following tracked chemicals: Doxorubicin, Epirubicin, Idarubicin, Daunorubicin, Mitoxantrone, Bleomycin, Oxaliplatin, Carboplatin, Liposomal Doxorubicin  o   Toxicity Monitoring/Prevention: . The patient has the following take home antiemetics prescribed: Prochlorperazine . The patient has the following take home medications prescribed: N/A . Medication allergies and previous infusion related reactions, if applicable, have been reviewed and addressed. Yes . The patient's current medication list has been assessed for drug-drug interactions with their chemotherapy  regimen. no significant drug-drug interactions were identified on review.  Order Review: . Are the treatment plan orders signed? Yes . Is the patient scheduled to see a provider prior to their treatment? No  I verify that I have reviewed each item in the above checklist and answered each question accordingly.  [name] [name] 03/06/2020 11:43 AM

## 2020-03-09 ENCOUNTER — Ambulatory Visit: Payer: PPO | Admitting: Radiation Oncology

## 2020-03-09 ENCOUNTER — Ambulatory Visit: Payer: PPO

## 2020-03-09 ENCOUNTER — Telehealth: Payer: Self-pay | Admitting: Oncology

## 2020-03-09 NOTE — Telephone Encounter (Signed)
Called [name] [name] and arranged for lab/port flush on 03/11/20 (2:45 labs/3:00 flush) before her radiation appointments.

## 2020-03-11 ENCOUNTER — Encounter: Payer: Self-pay | Admitting: Radiation Oncology

## 2020-03-11 ENCOUNTER — Other Ambulatory Visit: Payer: Self-pay

## 2020-03-11 ENCOUNTER — Inpatient Hospital Stay: Payer: PPO

## 2020-03-11 ENCOUNTER — Ambulatory Visit
Admission: RE | Admit: 2020-03-11 | Discharge: 2020-03-11 | Disposition: A | Discharge status: Home / Self Care | Payer: PPO | Source: Ambulatory Visit | Attending: Radiation Oncology | Admitting: Radiation Oncology

## 2020-03-11 VITALS — BP 139/68 | HR 68 | Temp 97.8°F | Resp 20 | Ht 66.0 in | Wt 172.0 lb

## 2020-03-11 DIAGNOSIS — Z803 Family history of malignant neoplasm of breast: Secondary | ICD-10-CM | POA: Insufficient documentation

## 2020-03-11 DIAGNOSIS — I2510 Atherosclerotic heart disease of native coronary artery without angina pectoris: Secondary | ICD-10-CM | POA: Insufficient documentation

## 2020-03-11 DIAGNOSIS — Z79899 Other long term (current) drug therapy: Secondary | ICD-10-CM | POA: Diagnosis not present

## 2020-03-11 DIAGNOSIS — C55 Malignant neoplasm of uterus, part unspecified: Secondary | ICD-10-CM

## 2020-03-11 DIAGNOSIS — Z8701 Personal history of pneumonia (recurrent): Secondary | ICD-10-CM | POA: Diagnosis not present

## 2020-03-11 DIAGNOSIS — Z7982 Long term (current) use of aspirin: Secondary | ICD-10-CM | POA: Diagnosis not present

## 2020-03-11 DIAGNOSIS — Z7984 Long term (current) use of oral hypoglycemic drugs: Secondary | ICD-10-CM | POA: Diagnosis not present

## 2020-03-11 DIAGNOSIS — I10 Essential (primary) hypertension: Secondary | ICD-10-CM | POA: Insufficient documentation

## 2020-03-11 DIAGNOSIS — K219 Gastro-esophageal reflux disease without esophagitis: Secondary | ICD-10-CM | POA: Insufficient documentation

## 2020-03-11 DIAGNOSIS — R519 Headache, unspecified: Secondary | ICD-10-CM | POA: Diagnosis not present

## 2020-03-11 DIAGNOSIS — F419 Anxiety disorder, unspecified: Secondary | ICD-10-CM | POA: Insufficient documentation

## 2020-03-11 DIAGNOSIS — E785 Hyperlipidemia, unspecified: Secondary | ICD-10-CM | POA: Diagnosis not present

## 2020-03-11 DIAGNOSIS — I252 Old myocardial infarction: Secondary | ICD-10-CM | POA: Insufficient documentation

## 2020-03-11 DIAGNOSIS — Z90722 Acquired absence of ovaries, bilateral: Secondary | ICD-10-CM | POA: Insufficient documentation

## 2020-03-11 DIAGNOSIS — Z90710 Acquired absence of both cervix and uterus: Secondary | ICD-10-CM | POA: Diagnosis not present

## 2020-03-11 DIAGNOSIS — Z87891 Personal history of nicotine dependence: Secondary | ICD-10-CM | POA: Insufficient documentation

## 2020-03-11 DIAGNOSIS — Z86010 Personal history of colonic polyps: Secondary | ICD-10-CM | POA: Insufficient documentation

## 2020-03-11 DIAGNOSIS — E119 Type 2 diabetes mellitus without complications: Secondary | ICD-10-CM | POA: Insufficient documentation

## 2020-03-11 LAB — CBC WITH DIFFERENTIAL (CANCER CENTER ONLY)
Abs Immature Granulocytes: 0.01 10*3/uL (ref 0.00–0.07)
Basophils Absolute: 0 10*3/uL (ref 0.0–0.1)
Basophils Relative: 1 %
Eosinophils Absolute: 0.5 10*3/uL (ref 0.0–0.5)
Eosinophils Relative: 8 %
HCT: 35.6 % — ABNORMAL LOW (ref 36.0–46.0)
Hemoglobin: 11.7 g/dL — ABNORMAL LOW (ref 12.0–15.0)
Immature Granulocytes: 0 %
Lymphocytes Relative: 40 %
Lymphs Abs: 2.5 10*3/uL (ref 0.7–4.0)
MCH: 29.8 pg (ref 26.0–34.0)
MCHC: 32.9 g/dL (ref 30.0–36.0)
MCV: 90.8 fL (ref 80.0–100.0)
Monocytes Absolute: 0.5 10*3/uL (ref 0.1–1.0)
Monocytes Relative: 7 %
Neutro Abs: 2.8 10*3/uL (ref 1.7–7.7)
Neutrophils Relative %: 44 %
Platelet Count: 253 10*3/uL (ref 150–400)
RBC: 3.92 MIL/uL (ref 3.87–5.11)
RDW: 13.9 % (ref 11.5–15.5)
WBC Count: 6.3 10*3/uL (ref 4.0–10.5)
nRBC: 0 % (ref 0.0–0.2)

## 2020-03-11 LAB — CMP (CANCER CENTER ONLY)
ALT: 12 U/L (ref 0–44)
AST: 16 U/L (ref 15–41)
Albumin: 3.8 g/dL (ref 3.5–5.0)
Alkaline Phosphatase: 62 U/L (ref 38–126)
Anion gap: 11 (ref 5–15)
BUN: 10 mg/dL (ref 8–23)
CO2: 28 mmol/L (ref 22–32)
Calcium: 9.4 mg/dL (ref 8.9–10.3)
Chloride: 104 mmol/L (ref 98–111)
Creatinine: 0.82 mg/dL (ref 0.44–1.00)
GFR, Est AFR Am: >60 mL/min (ref >60)
GFR, Estimated: >60 mL/min (ref >60)
Glucose, Bld: 158 mg/dL — ABNORMAL HIGH (ref 70–99)
Potassium: 3.4 mmol/L — ABNORMAL LOW (ref 3.5–5.1)
Sodium: 143 mmol/L (ref 135–145)
Total Bilirubin: 0.7 mg/dL (ref 0.3–1.2)
Total Protein: 7 g/dL (ref 6.5–8.1)

## 2020-03-11 MED ORDER — HEPARIN SOD (PORK) LOCK FLUSH 100 UNIT/ML IV SOLN
500.0000 [IU] | Freq: Once | INTRAVENOUS | Status: AC
  Administered 2020-03-11: 500 [IU]
  Filled 2020-03-11: qty 5

## 2020-03-11 MED ORDER — SODIUM CHLORIDE 0.9% FLUSH
10.0000 mL | Freq: Once | INTRAVENOUS | Status: AC
  Administered 2020-03-11: 10 mL
  Filled 2020-03-11: qty 10

## 2020-03-11 NOTE — Progress Notes (Signed)
Radiation Oncology         [phone] ________________________________  Initial Outpatient Consultation  Name: [name] [name] MRN: [id number]  Date: 03/11/2020  DOB: [birth year]  SA:[id number], Hal Hope, MD  Lafonda Mosses, MD   REFERRING PHYSICIAN: Lafonda Mosses, MD  DIAGNOSIS: The encounter diagnosis was Carcinosarcoma of uterus [facility]).  Stage IA (pT1a, pN0) uterine carcinosarcoma  HISTORY OF PRESENT ILLNESS::[name] [name] is a 74 y.o. female who is seen as a courtesy of Dr. Berline Lopes for an opinion concerning radiation therapy as part of management for her recently diagnosed uterine cancer. Today, she is accompanied by daughter. The patient presented to Dr. Marvel Plan, OB-GYN, on 12/23/2019 with complaints of abdominal bloating, pelvic pressure, and vaginal discharge. At that time, she underwent an endometrial biopsy that showed inactive endometrial glandular mucosa without malignancy. Following the biopsy, she developed significant pelvic pain, cramping, and ongoing vaginal bleeding. She then underwent a saline infusion sonogram with findings of clot distending the uterine cavity. A small amount of tissue was removed at that time, but it did not show any hyperplasia nor malignancy. Thus, she was treated with Rocephin and Augmentin to cover possible endometriosis.  CT scan of abdomen and pelvis on 01/01/2020 showed severe distension of the endometrium that was highly suspicious for cervical obstruction or stenosis possibly due to a neoplastic process. Hysteroscopy was recommended for further evaluation.  The patient underwent a hysteroscopy with MyoSure and suction D&C on 01/03/2020 under the care of Dr. Marvel Plan. Pathology from the procedure revealed poorly differentiated malignancy with extensive necrosis. Differential diagnoses included poorly differentiated carcinoma and high-grade stromal sarcoma.  Given the above results, the patient was referred to Dr. Berline Lopes and see in  consultation on 07/302/2021. At that time, it was recommended that she proceed with surgical staging via minimally invasive approach.   Of note, surgery was postponed after the patient was diagnosed with COVID-19 on 01/20/2020. After recovering, she underwent a robotic-assisted laparoscopic total hysterectomy with bilateral salpingo-oophorectomy, sentinel lymph node biopsy, and left pelvic LND on 02/04/2020. Pathology from the procedure revealed carcinosarcoma confined to the uterus and involving less than half of the myometrium. There was no carcinoma identified in a single right obturator sentinel lymph node, a single presacral sentinel lymph node, or three left pelvic lymph nodes.  The patient's case was discussed at the Gynecologic Oncology Multi-Disciplinary Conference on 02/24/2020, during which time it was recommended that she proceed with adjuvant therapy with Carboplatin and Paclitaxel and consideration for vaginal brachytherapy.  The patient was seen in consultation with Dr. Alvy Bimler on 02/27/2020, during which time they discussed chemotherapy with Carboplatin and Paclitaxel x6 cycles to begin tomorrow, 03/12/2020.  PREVIOUS RADIATION THERAPY: No  PAST MEDICAL HISTORY:  Past Medical History:  Diagnosis Date  . Adenomatous colon polyp 11/2005  . Anginal pain ([place])    last time 2016  . Anxiety   . Arthritis   . CAD (coronary artery disease)    2V CABG 1996 (LIMA to LAD, SVG to RCA) Dr. Servando Snare.; last stress test in 2010  . Diabetes mellitus without complication ([place])    type II  . GERD (gastroesophageal reflux disease)   . Hyperlipidemia   . Hypertension   . Muscle cramps    and pains  . Myocardial infarction ([place])    1996  . Pneumonia    hx of x 2   . Ulcerative colitis   . Uterine cancer ([place])     PAST SURGICAL HISTORY: Past Surgical History:  Procedure  Laterality Date  . BACK SURGERY     lumbar fusion 25 years ago and ruptured disk 10 years ago same area  . BACK  SURGERY     ruptered disc under lumbar disc  . CARDIAC CATHETERIZATION N/A 10/13/2014   Procedure: Left Heart Cath And Coronary Angiography;  Surgeon: Burnell Blanks, MD;  Location: [place] [facility];  Service: Cardiovascular;  Laterality: N/A;  . CARDIAC CATHETERIZATION N/A 10/13/2014   Procedure: Coronary Stent Intervention;  Surgeon: Burnell Blanks, MD;  Location: [place] INVASIVE CV LAB [facility];  Service: Cardiovascular;  Laterality: N/A;  . COLONOSCOPY    . CORONARY ANGIOPLASTY WITH STENT PLACEMENT  10/13/2014   DES to [place]  . CORONARY ARTERY BYPASS GRAFT  1996   x 2  . DILATATION & CURETTAGE/HYSTEROSCOPY WITH MYOSURE N/A 01/03/2020   Procedure: DILATATION & CURETTAGE/HYSTEROSCOPY WITH POSSIBLE MYOSURE;  Surgeon: Paula Compton, MD;  Location: [place];  Service: Gynecology;  Laterality: N/A;  . DILATION AND EVACUATION N/A 01/03/2020   Procedure: DILATATION AND EVACUATION;  Surgeon: Paula Compton, MD;  Location: [place];  Service: Gynecology;  Laterality: N/A;  . IR IMAGING GUIDED PORT INSERTION  03/06/2020  . OPERATIVE ULTRASOUND N/A 01/03/2020   Procedure: OPERATIVE ULTRASOUND;  Surgeon: Paula Compton, MD;  Location: [place];  Service: Gynecology;  Laterality: N/A;  . POLYPECTOMY    . ROBOTIC ASSISTED TOTAL HYSTERECTOMY WITH BILATERAL SALPINGO OOPHERECTOMY Bilateral 02/04/2020   Procedure: XI ROBOTIC ASSISTED TOTAL HYSTERECTOMY WITH BILATERAL SALPINGO OOPHORECTOMY, LYMPH NODE DISSECTION;  Surgeon: Lafonda Mosses, MD;  Location: [facility];  Service: Gynecology;  Laterality: Bilateral;  . SENTINEL NODE BIOPSY N/A 02/04/2020   Procedure: SENTINEL NODE BIOPSY;  Surgeon: Lafonda Mosses, MD;  Location: [facility];  Service: Gynecology;  Laterality: N/A;  . UPPER GASTROINTESTINAL ENDOSCOPY      FAMILY HISTORY:  Family History  Problem Relation Age of Onset  . Diabetes Mother   . Heart disease Mother   . Heart attack Mother 80  . Hypertension Mother   . Lung  cancer Brother   . Colon cancer Brother   . Breast cancer Sister   . Kidney disease Brother        renal cancer  . Heart disease Sister   . Colon cancer Daughter        Thinks her daughter had negative genetic testing  . Hypertension Sister   . Stroke Sister   . Pancreatitis Other   . Breast cancer Daughter   . Stomach cancer Neg Hx     SOCIAL HISTORY:  Social History   Tobacco Use  . Smoking status: Former Smoker    Years: 20.00    Quit date: 10/04/1994    Years since quitting: 25.4  . Smokeless tobacco: Never Used  Vaping Use  . Vaping Use: Never used  Substance Use Topics  . Alcohol use: No  . Drug use: No    ALLERGIES: No Known Allergies  MEDICATIONS:  Current Outpatient Medications  Medication Sig Dispense Refill  . aspirin EC 81 MG tablet Take 81 mg by mouth daily. Swallow whole.     . balsalazide (COLAZAL) 750 MG capsule TAKE 2 CAPSULES (1,500 MG TOTAL) BY MOUTH 2 (TWO) TIMES DAILY. TAKE 2 CAPSULES TWO TIMES DAILY (Patient taking differently: Take 1,500 mg by mouth 2 (two) times daily. ) 360 capsule 0  . calcium carbonate (CALCIUM 600) 600 MG TABS tablet Take 600 mg by mouth daily with breakfast.    .  carvedilol (COREG) 3.125 MG tablet TAKE 1 TABLET (3.125 MG TOTAL) BY MOUTH 2 (TWO) TIMES DAILY. 180 tablet 2  . Cholecalciferol (VITAMIN D) 50 MCG (2000 UT) tablet Take 2,000 Units by mouth daily.     . clopidogrel (PLAVIX) 75 MG tablet Take 75 mg by mouth daily. Last dose prior to D and C on 01-03-20     . dexamethasone (DECADRON) 4 MG tablet Take 2 tabs at the night before and 2 tab the morning of chemotherapy, every 3 weeks, by mouth x 6 cycles 36 tablet 6  . docusate sodium (COLACE) 100 MG capsule Take 100 mg by mouth daily.     Marland Kitchen FLUoxetine (PROZAC) 10 MG capsule Take 10 mg by mouth daily.     Marland Kitchen lidocaine-prilocaine (EMLA) cream Apply to affected area once 30 g 3  . LORazepam (ATIVAN) 0.5 MG tablet Take 0.5 mg by mouth as needed for anxiety (for anxiety). Reported  on 12/04/2015    . losartan-hydrochlorothiazide (HYZAAR) 100-12.5 MG tablet Take 1 tablet by mouth daily.    . metFORMIN (GLUCOPHAGE-XR) 500 MG 24 hr tablet Take 500 mg by mouth daily with supper.   5  . Multiple Vitamins-Minerals (MULTIVITAMIN WITH MINERALS) tablet Take 1 tablet by mouth daily.      . ondansetron (ZOFRAN) 8 MG tablet Take 1 tablet (8 mg total) by mouth every 8 (eight) hours as needed. 30 tablet 1  . pantoprazole (PROTONIX) 40 MG tablet Take 1 tablet (40 mg total) by mouth daily. Must keep upcoming appt in Nov for future refills 90 tablet 0  . prochlorperazine (COMPAZINE) 10 MG tablet Take 1 tablet (10 mg total) by mouth every 6 (six) hours as needed (Nausea or vomiting). 30 tablet 1  . simvastatin (ZOCOR) 40 MG tablet Take 40 mg by mouth every evening.   1  . nitroGLYCERIN (NITROSTAT) 0.4 MG SL tablet Place 1 tablet (0.4 mg total) under the tongue every 5 (five) minutes as needed for chest pain. 25 tablet 6   No current facility-administered medications for this encounter.    REVIEW OF SYSTEMS:  A 10+ POINT REVIEW OF SYSTEMS WAS OBTAINED including neurology, dermatology, psychiatry, cardiac, respiratory, lymph, extremities, GI, GU, musculoskeletal, constitutional, reproductive, HEENT.  She denies any vaginal bleeding or discharge at this time.  She denies any pain within the pelvis region.   PHYSICAL EXAM:  height is 5' 6"  (1.676 m) and weight is 172 lb (78 kg). Her temperature is 97.8 F (36.6 C). Her blood pressure is 139/68 and her pulse is 68. Her respiration is 20 and oxygen saturation is 100%.   General: Alert and oriented, in no acute distress HEENT: Head is normocephalic. Extraocular movements are intact.  Neck: Neck is supple, no palpable cervical or supraclavicular lymphadenopathy. Heart: Regular in rate and rhythm with no murmurs, rubs, or gallops. Chest: Clear to auscultation bilaterally, with no rhonchi, wheezes, or rales. Abdomen: Soft, nontender, nondistended,  with no rigidity or guarding. Extremities: No cyanosis or edema. Lymphatics: see Neck Exam Skin: No concerning lesions. Musculoskeletal: symmetric strength and muscle tone throughout. Neurologic: Cranial nerves II through XII are grossly intact. No obvious focalities. Speech is fluent. Coordination is intact. Psychiatric: Judgment and insight are intact. Affect is appropriate. Pelvic exam deferred in light of recent surgery.  To be performed at the time of her brachytherapy planning and treatment.  ECOG = 1  0 - Asymptomatic (Fully active, able to carry on all predisease activities without restriction)  1 - Symptomatic but  completely ambulatory (Restricted in physically strenuous activity but ambulatory and able to carry out work of a light or sedentary nature. For example, light housework, office work)  2 - Symptomatic, <50% in bed during the day (Ambulatory and capable of all self care but unable to carry out any work activities. Up and about more than 50% of waking hours)  3 - Symptomatic, >50% in bed, but not bedbound (Capable of only limited self-care, confined to bed or chair 50% or more of waking hours)  4 - Bedbound (Completely disabled. Cannot carry on any self-care. Totally confined to bed or chair)  5 - Death   Eustace Pen MM, Creech RH, Tormey DC, et al. [phone]). "Toxicity and response criteria of the [facility]". [place] Oncol. 5 (6): 649-55  LABORATORY DATA:  Lab Results  Component Value Date   WBC 6.3 03/11/2020   HGB 11.7 (L) 03/11/2020   HCT 35.6 (L) 03/11/2020   MCV 90.8 03/11/2020   PLT 253 03/11/2020   NEUTROABS 2.8 03/11/2020   Lab Results  Component Value Date   NA 143 03/11/2020   K 3.4 (L) 03/11/2020   CL 104 03/11/2020   CO2 28 03/11/2020   GLUCOSE 158 (H) 03/11/2020   CREATININE 0.82 03/11/2020   CALCIUM 9.4 03/11/2020      RADIOGRAPHY: IR IMAGING GUIDED PORT INSERTION  Result Date: 03/06/2020 CLINICAL DATA:  Uterine  carcinosarcoma and need for porta cath to begin chemotherapy. EXAM: IMPLANTED PORT A CATH PLACEMENT WITH ULTRASOUND AND FLUOROSCOPIC GUIDANCE ANESTHESIA/SEDATION: 2.0 mg IV Versed; 100 mcg IV Fentanyl Total Moderate Sedation Time:  28 minutes The patient's level of consciousness and physiologic status were continuously monitored during the procedure by Radiology nursing. Additional Medications: 2 g IV Ancef. FLUOROSCOPY TIME:  12 seconds.  3.0 mGy. PROCEDURE: The procedure, risks, benefits, and alternatives were explained to the patient. Questions regarding the procedure were encouraged and answered. The patient understands and consents to the procedure. A time-out was performed prior to initiating the procedure. Ultrasound was utilized to confirm patency of the right internal jugular vein. The right neck and chest were prepped with chlorhexidine in a sterile fashion, and a sterile drape was applied covering the operative field. Maximum barrier sterile technique with sterile gowns and gloves were used for the procedure. Local anesthesia was provided with 1% lidocaine. After creating a small venotomy incision, a 21 gauge needle was advanced into the right internal jugular vein under direct, real-time ultrasound guidance. Ultrasound image documentation was performed. After securing guidewire access, an 8 Fr dilator was placed. A J-wire was kinked to measure appropriate catheter length. A subcutaneous port pocket was then created along the upper chest wall utilizing sharp and blunt dissection. Portable cautery was utilized. The pocket was irrigated with sterile saline. A single lumen power injectable port was chosen for placement. The 8 Fr catheter was tunneled from the port pocket site to the venotomy incision. The port was placed in the pocket. External catheter was trimmed to appropriate length based on guidewire measurement. At the venotomy, an 8 Fr peel-away sheath was placed over a guidewire. The catheter was  then placed through the sheath and the sheath removed. Final catheter positioning was confirmed and documented with a fluoroscopic spot image. The port was accessed with a needle and aspirated and flushed with heparinized saline. The access needle was removed. The venotomy and port pocket incisions were closed with subcutaneous 3-0 Monocryl and subcuticular 4-0 Vicryl. Dermabond was applied to both incisions.  COMPLICATIONS: COMPLICATIONS None FINDINGS: After catheter placement, the tip lies at the cavo-atrial junction. The catheter aspirates normally and is ready for immediate use. IMPRESSION: Placement of single lumen port a cath via right internal jugular vein. The catheter tip lies at the cavo-atrial junction. A power injectable port a cath was placed and is ready for immediate use. Electronically Signed   By: Aletta Edouard M.D.   On: 03/06/2020 16:26      IMPRESSION: Stage IA (pT1a, pN0) uterine carcinosarcoma  Given the pathologic findings with a grade 3 aggressive carcinosarcoma the patient will be at risk for vaginal cuff recurrence and would agree with Gyn/Onc conference recommendations for vaginal brachytherapy.  Today, I talked to the patient and daughter about the findings and work-up thus far.  We discussed the natural history of uterine cancer and general treatment, highlighting the role of radiotherapy in the management.  We discussed the available radiation techniques, and focused on the details of logistics and delivery.  We reviewed the anticipated acute and late sequelae associated with radiation in this setting.  The patient was encouraged to ask questions that I answered to the best of my ability.  A patient consent form was discussed and signed.  We retained a copy for our records.  The patient would like to proceed with radiation and will be scheduled for CT simulation.  PLAN: Anticipate radiation therapy starting during the third or fourth cycle of her adjuvant chemotherapy.  She  will be treated with iridium 192 as the high-dose-rate source.  Anticipate 5 high-dose-rate treatments directed at the vaginal cuff.  Total time spent in this encounter was 65 minutes which included reviewing the patient's most recent consultations, CT of abdomen and pelvis, hysteroscopy, hysterectomy, pathology reports, physical examination, and documentation.  ------------------------------------------------  Blair Promise, PhD, MD  This document serves as a record of services personally performed by Gery Pray, MD. It was created on his behalf by Clerance Lav, a trained medical scribe. The creation of this record is based on the scribe's personal observations and the provider's statements to them. This document has been checked and approved by the attending provider.

## 2020-03-11 NOTE — Progress Notes (Signed)
Error

## 2020-03-11 NOTE — Progress Notes (Signed)
GYN Location of Tumor / Histology: Carcinoma of the Uterus  [name] [name] presented with symptoms of bleeding and pain in her stomach, "labor pains".  Had biopsy done it was negative.  She continued to have pains, returned to doctor and ultimately had  D & C done.  Biopsies revealed: Surgical Pathology 02/04/2020    Past/Anticipated interventions by Gyn/Onc surgery, if any:  -Hysterectomy 02/04/2020  Past/Anticipated interventions by medical oncology, if any:  Dr. Alvy Bimler 02/27/2020 -We discussed some of the risks, benefits, side-effects of carboplatin & Taxol. Treatment is intravenous, every 3 weeks x 6 cycles -I will see her back prior to cycle 2 of treatment She would like to get her treatment started on September 30  Weight changes, if any: Lost a few pounds following hysterectomy.  Has since gained them back.  Bowel/Bladder complaints, if any: Occasional constipation, taking senekot. No complaints of changes to her bladder.   Nausea/Vomiting, if any:  no  Pain issues, if any:  No  Has occasional lightheadedness.   SAFETY ISSUES:  Prior radiation? No  Pacemaker/ICD? No  Possible current pregnancy? Postmenopausal,. Hysterectomy  Is the patient on methotrexate? no  Current Complaints / other details:

## 2020-03-12 ENCOUNTER — Encounter: Payer: Self-pay | Admitting: Oncology

## 2020-03-12 ENCOUNTER — Inpatient Hospital Stay: Payer: PPO

## 2020-03-12 ENCOUNTER — Other Ambulatory Visit: Payer: Self-pay

## 2020-03-12 VITALS — BP 154/72 | HR 80 | Temp 97.6°F | Resp 19 | Wt 171.8 lb

## 2020-03-12 DIAGNOSIS — C55 Malignant neoplasm of uterus, part unspecified: Secondary | ICD-10-CM

## 2020-03-12 DIAGNOSIS — R519 Headache, unspecified: Secondary | ICD-10-CM | POA: Diagnosis not present

## 2020-03-12 MED ORDER — SODIUM CHLORIDE 0.9 % IV SOLN
Freq: Once | INTRAVENOUS | Status: AC
  Filled 2020-03-12: qty 250

## 2020-03-12 MED ORDER — SODIUM CHLORIDE 0.9 % IV SOLN
175.0000 mg/m2 | Freq: Once | INTRAVENOUS | Status: AC
  Administered 2020-03-12: 336 mg via INTRAVENOUS
  Filled 2020-03-12: qty 56

## 2020-03-12 MED ORDER — HEPARIN SOD (PORK) LOCK FLUSH 100 UNIT/ML IV SOLN
500.0000 [IU] | Freq: Once | INTRAVENOUS | Status: AC | PRN
  Administered 2020-03-12: 500 [IU]
  Filled 2020-03-12: qty 5

## 2020-03-12 MED ORDER — SODIUM CHLORIDE 0.9 % IV SOLN
150.0000 mg | Freq: Once | INTRAVENOUS | Status: AC
  Administered 2020-03-12: 150 mg via INTRAVENOUS
  Filled 2020-03-12: qty 150

## 2020-03-12 MED ORDER — FAMOTIDINE IN NACL 20-0.9 MG/50ML-% IV SOLN
INTRAVENOUS | Status: AC
  Filled 2020-03-12: qty 50

## 2020-03-12 MED ORDER — SODIUM CHLORIDE 0.9 % IV SOLN
10.0000 mg | Freq: Once | INTRAVENOUS | Status: AC
  Administered 2020-03-12: 10 mg via INTRAVENOUS
  Filled 2020-03-12: qty 10

## 2020-03-12 MED ORDER — SODIUM CHLORIDE 0.9 % IV SOLN
521.1240 mg | Freq: Once | INTRAVENOUS | Status: AC
  Administered 2020-03-12: 520 mg via INTRAVENOUS
  Filled 2020-03-12: qty 52

## 2020-03-12 MED ORDER — PALONOSETRON HCL INJECTION 0.25 MG/5ML
0.2500 mg | Freq: Once | INTRAVENOUS | Status: AC
  Administered 2020-03-12: 0.25 mg via INTRAVENOUS

## 2020-03-12 MED ORDER — PALONOSETRON HCL INJECTION 0.25 MG/5ML
INTRAVENOUS | Status: AC
  Filled 2020-03-12: qty 5

## 2020-03-12 MED ORDER — DIPHENHYDRAMINE HCL 50 MG/ML IJ SOLN
25.0000 mg | Freq: Once | INTRAMUSCULAR | Status: AC
  Administered 2020-03-12: 25 mg via INTRAVENOUS

## 2020-03-12 MED ORDER — FAMOTIDINE IN NACL 20-0.9 MG/50ML-% IV SOLN
20.0000 mg | Freq: Once | INTRAVENOUS | Status: AC
  Administered 2020-03-12: 20 mg via INTRAVENOUS

## 2020-03-12 MED ORDER — SODIUM CHLORIDE 0.9% FLUSH
10.0000 mL | INTRAVENOUS | Status: DC | PRN
  Administered 2020-03-12: 10 mL
  Filled 2020-03-12: qty 10

## 2020-03-12 MED ORDER — DIPHENHYDRAMINE HCL 50 MG/ML IJ SOLN
INTRAMUSCULAR | Status: AC
  Filled 2020-03-12: qty 1

## 2020-03-12 NOTE — Progress Notes (Signed)
Met with [name] [name] in the infusion room.  Discussed writing a letter to excuse her from jury duty while she is getting treatment. She will call this afternoon with her juror number and dates.

## 2020-03-12 NOTE — Patient Instructions (Signed)
[facility] Discharge Instructions for Patients Receiving Chemotherapy  Today you received the following chemotherapy agents: taxol, carboplatin  To help prevent nausea and vomiting after your treatment, we encourage you to take your nausea medication as directed   If you develop nausea and vomiting that is not controlled by your nausea medication, call the clinic.   BELOW ARE SYMPTOMS THAT SHOULD BE REPORTED IMMEDIATELY:  *FEVER GREATER THAN 100.5 F  *CHILLS WITH OR WITHOUT FEVER  NAUSEA AND VOMITING THAT IS NOT CONTROLLED WITH YOUR NAUSEA MEDICATION  *UNUSUAL SHORTNESS OF BREATH  *UNUSUAL BRUISING OR BLEEDING  TENDERNESS IN MOUTH AND THROAT WITH OR WITHOUT PRESENCE OF ULCERS  *URINARY PROBLEMS  *BOWEL PROBLEMS  UNUSUAL RASH Items with * indicate a potential emergency and should be followed up as soon as possible.  Feel free to call the clinic should you have any questions or concerns. The clinic phone number is [phone].  Please show the [facility] at check-in to the Emergency Department and triage nurse.  Paclitaxel injection What is this medicine? PACLITAXEL (PAK li TAX el) is a chemotherapy drug. It targets fast dividing cells, like cancer cells, and causes these cells to die. This medicine is used to treat ovarian cancer, breast cancer, lung cancer, Kaposi's sarcoma, and other cancers. This medicine may be used for other purposes; ask your health care provider or pharmacist if you have questions. COMMON BRAND NAME(S): Onxol, Taxol What should I tell my health care provider before I take this medicine? They need to know if you have any of these conditions:  history of irregular heartbeat  liver disease  low blood counts, like low white cell, platelet, or red cell counts  lung or breathing disease, like asthma  tingling of the fingers or toes, or other nerve disorder  an unusual or allergic reaction to paclitaxel, alcohol,  polyoxyethylated castor oil, other chemotherapy, other medicines, foods, dyes, or preservatives  pregnant or trying to get pregnant  breast-feeding How should I use this medicine? This drug is given as an infusion into a vein. It is administered in a hospital or clinic by a specially trained health care professional. Talk to your pediatrician regarding the use of this medicine in children. Special care may be needed. Overdosage: If you think you have taken too much of this medicine contact a poison control center or emergency room at once. NOTE: This medicine is only for you. Do not share this medicine with others. What if I miss a dose? It is important not to miss your dose. Call your doctor or health care professional if you are unable to keep an appointment. What may interact with this medicine? Do not take this medicine with any of the following medications:  disulfiram  metronidazole This medicine may also interact with the following medications:  antiviral medicines for hepatitis, HIV or AIDS  certain antibiotics like erythromycin and clarithromycin  certain medicines for fungal infections like ketoconazole and itraconazole  certain medicines for seizures like carbamazepine, phenobarbital, phenytoin  gemfibrozil  nefazodone  rifampin  St. John's wort This list may not describe all possible interactions. Give your health care provider a list of all the medicines, herbs, non-prescription drugs, or dietary supplements you use. Also tell them if you smoke, drink alcohol, or use illegal drugs. Some items may interact with your medicine. What should I watch for while using this medicine? Your condition will be monitored carefully while you are receiving this medicine. You will need important blood  work done while you are taking this medicine. This medicine can cause serious allergic reactions. To reduce your risk you will need to take other medicine(s) before treatment with this  medicine. If you experience allergic reactions like skin rash, itching or hives, swelling of the face, lips, or tongue, tell your doctor or health care professional right away. In some cases, you may be given additional medicines to help with side effects. Follow all directions for their use. This drug may make you feel generally unwell. This is not uncommon, as chemotherapy can affect healthy cells as well as cancer cells. Report any side effects. Continue your course of treatment even though you feel ill unless your doctor tells you to stop. Call your doctor or health care professional for advice if you get a fever, chills or sore throat, or other symptoms of a cold or flu. Do not treat yourself. This drug decreases your body's ability to fight infections. Try to avoid being around people who are sick. This medicine may increase your risk to bruise or bleed. Call your doctor or health care professional if you notice any unusual bleeding. Be careful brushing and flossing your teeth or using a toothpick because you may get an infection or bleed more easily. If you have any dental work done, tell your dentist you are receiving this medicine. Avoid taking products that contain aspirin, acetaminophen, ibuprofen, naproxen, or ketoprofen unless instructed by your doctor. These medicines may hide a fever. Do not become pregnant while taking this medicine. Women should inform their doctor if they wish to become pregnant or think they might be pregnant. There is a potential for serious side effects to an unborn child. Talk to your health care professional or pharmacist for more information. Do not breast-feed an infant while taking this medicine. Men are advised not to father a child while receiving this medicine. This product may contain alcohol. Ask your pharmacist or healthcare provider if this medicine contains alcohol. Be sure to tell all healthcare providers you are taking this medicine. Certain medicines,  like metronidazole and disulfiram, can cause an unpleasant reaction when taken with alcohol. The reaction includes flushing, headache, nausea, vomiting, sweating, and increased thirst. The reaction can last from 30 minutes to several hours. What side effects may I notice from receiving this medicine? Side effects that you should report to your doctor or health care professional as soon as possible:  allergic reactions like skin rash, itching or hives, swelling of the face, lips, or tongue  breathing problems  changes in vision  fast, irregular heartbeat  high or low blood pressure  mouth sores  pain, tingling, numbness in the hands or feet  signs of decreased platelets or bleeding - bruising, pinpoint red spots on the skin, black, tarry stools, blood in the urine  signs of decreased red blood cells - unusually weak or tired, feeling faint or lightheaded, falls  signs of infection - fever or chills, cough, sore throat, pain or difficulty passing urine  signs and symptoms of liver injury like dark yellow or brown urine; general ill feeling or flu-like symptoms; light-colored stools; loss of appetite; nausea; right upper belly pain; unusually weak or tired; yellowing of the eyes or skin  swelling of the ankles, feet, hands  unusually slow heartbeat Side effects that usually do not require medical attention (report to your doctor or health care professional if they continue or are bothersome):  diarrhea  hair loss  loss of appetite  muscle or joint pain  nausea, vomiting  pain, redness, or irritation at site where injected  tiredness This list may not describe all possible side effects. Call your doctor for medical advice about side effects. You may report side effects to FDA at 1-800-FDA-1088. Where should I keep my medicine? This drug is given in a hospital or clinic and will not be stored at home. NOTE: This sheet is a summary. It may not cover all possible information.  If you have questions about this medicine, talk to your doctor, pharmacist, or health care provider.  2020 Elsevier/Gold Standard (2017-01-31 13:14:55)  Carboplatin injection What is this medicine? CARBOPLATIN (KAR boe pla tin) is a chemotherapy drug. It targets fast dividing cells, like cancer cells, and causes these cells to die. This medicine is used to treat ovarian cancer and many other cancers. This medicine may be used for other purposes; ask your health care provider or pharmacist if you have questions. COMMON BRAND NAME(S): Paraplatin What should I tell my health care provider before I take this medicine? They need to know if you have any of these conditions:  blood disorders  hearing problems  kidney disease  recent or ongoing radiation therapy  an unusual or allergic reaction to carboplatin, cisplatin, other chemotherapy, other medicines, foods, dyes, or preservatives  pregnant or trying to get pregnant  breast-feeding How should I use this medicine? This drug is usually given as an infusion into a vein. It is administered in a hospital or clinic by a specially trained health care professional. Talk to your pediatrician regarding the use of this medicine in children. Special care may be needed. Overdosage: If you think you have taken too much of this medicine contact a poison control center or emergency room at once. NOTE: This medicine is only for you. Do not share this medicine with others. What if I miss a dose? It is important not to miss a dose. Call your doctor or health care professional if you are unable to keep an appointment. What may interact with this medicine?  medicines for seizures  medicines to increase blood counts like filgrastim, pegfilgrastim, sargramostim  some antibiotics like amikacin, gentamicin, neomycin, streptomycin, tobramycin  vaccines Talk to your doctor or health care professional before taking any of these  medicines:  acetaminophen  aspirin  ibuprofen  ketoprofen  naproxen This list may not describe all possible interactions. Give your health care provider a list of all the medicines, herbs, non-prescription drugs, or dietary supplements you use. Also tell them if you smoke, drink alcohol, or use illegal drugs. Some items may interact with your medicine. What should I watch for while using this medicine? Your condition will be monitored carefully while you are receiving this medicine. You will need important blood work done while you are taking this medicine. This drug may make you feel generally unwell. This is not uncommon, as chemotherapy can affect healthy cells as well as cancer cells. Report any side effects. Continue your course of treatment even though you feel ill unless your doctor tells you to stop. In some cases, you may be given additional medicines to help with side effects. Follow all directions for their use. Call your doctor or health care professional for advice if you get a fever, chills or sore throat, or other symptoms of a cold or flu. Do not treat yourself. This drug decreases your body's ability to fight infections. Try to avoid being around people who are sick. This medicine may increase your risk to bruise or bleed.  Call your doctor or health care professional if you notice any unusual bleeding. Be careful brushing and flossing your teeth or using a toothpick because you may get an infection or bleed more easily. If you have any dental work done, tell your dentist you are receiving this medicine. Avoid taking products that contain aspirin, acetaminophen, ibuprofen, naproxen, or ketoprofen unless instructed by your doctor. These medicines may hide a fever. Do not become pregnant while taking this medicine. Women should inform their doctor if they wish to become pregnant or think they might be pregnant. There is a potential for serious side effects to an unborn child. Talk  to your health care professional or pharmacist for more information. Do not breast-feed an infant while taking this medicine. What side effects may I notice from receiving this medicine? Side effects that you should report to your doctor or health care professional as soon as possible:  allergic reactions like skin rash, itching or hives, swelling of the face, lips, or tongue  signs of infection - fever or chills, cough, sore throat, pain or difficulty passing urine  signs of decreased platelets or bleeding - bruising, pinpoint red spots on the skin, black, tarry stools, nosebleeds  signs of decreased red blood cells - unusually weak or tired, fainting spells, lightheadedness  breathing problems  changes in hearing  changes in vision  chest pain  high blood pressure  low blood counts - This drug may decrease the number of white blood cells, red blood cells and platelets. You may be at increased risk for infections and bleeding.  nausea and vomiting  pain, swelling, redness or irritation at the injection site  pain, tingling, numbness in the hands or feet  problems with balance, talking, walking  trouble passing urine or change in the amount of urine Side effects that usually do not require medical attention (report to your doctor or health care professional if they continue or are bothersome):  hair loss  loss of appetite  metallic taste in the mouth or changes in taste This list may not describe all possible side effects. Call your doctor for medical advice about side effects. You may report side effects to FDA at 1-800-FDA-1088. Where should I keep my medicine? This drug is given in a hospital or clinic and will not be stored at home. NOTE: This sheet is a summary. It may not cover all possible information. If you have questions about this medicine, talk to your doctor, pharmacist, or health care provider.  2020 Elsevier/Gold Standard (2007-09-04 14:38:05)

## 2020-03-13 ENCOUNTER — Encounter: Payer: Self-pay | Admitting: *Deleted

## 2020-03-13 ENCOUNTER — Telehealth: Payer: Self-pay | Admitting: *Deleted

## 2020-03-13 NOTE — Progress Notes (Signed)
[facility] Psychosocial Distress Screening Clinical Social Work  Clinical Social Work was referred by distress screening protocol.  The patient scored a 10 on the Psychosocial Distress Thermometer which indicates severe distress. Clinical Social Worker contacted patient by phone to assess for distress and other psychosocial needs.   Mrs. Allende shared she was feeling mild side effects from treatment yesterday, but overall feeling well.  Patient shared she is "up and down" emotionally, as her husband passed away a few months ago. CSW and patient discussed how difficult it is to process and grieve the loss of a spouse while managing cancer.  She identified a strong support system consisting of 4 biological daughters and 3 stepchildren.  She also shared she relies heavily on her faith in God and finds comfort in His plan.    CSW encouraged patient to utilize counseling to cope with loss and life changes.  Patient was appreciative and agreed to call CSW if she would like to pursue counseling support.   [facility] DISTRESS SCREENING 03/11/2020  Screening Type Initial Screening  Distress experienced in past week (1-10) 10  Family Problem type Partner  Emotional problem type Depression;Nervousness/Anxiety;Adjusting to illness;Feeling hopeless  Physical Problem type Pain;Sleep/insomnia;Constipation/diarrhea  Other Contact via phone [phone]    Clinical Social Worker follow up needed: No.  If yes, follow up plan:  Gwinda Maine, LCSW

## 2020-03-15 ENCOUNTER — Other Ambulatory Visit: Payer: Self-pay | Admitting: Gastroenterology

## 2020-03-16 ENCOUNTER — Encounter: Payer: Self-pay | Admitting: Hematology and Oncology

## 2020-03-16 ENCOUNTER — Inpatient Hospital Stay: Payer: PPO | Attending: Gynecologic Oncology | Admitting: Hematology and Oncology

## 2020-03-16 ENCOUNTER — Other Ambulatory Visit: Payer: Self-pay

## 2020-03-16 ENCOUNTER — Telehealth: Payer: Self-pay | Admitting: Oncology

## 2020-03-16 DIAGNOSIS — T451X5A Adverse effect of antineoplastic and immunosuppressive drugs, initial encounter: Secondary | ICD-10-CM | POA: Insufficient documentation

## 2020-03-16 DIAGNOSIS — K5909 Other constipation: Secondary | ICD-10-CM | POA: Diagnosis not present

## 2020-03-16 DIAGNOSIS — E876 Hypokalemia: Secondary | ICD-10-CM | POA: Insufficient documentation

## 2020-03-16 DIAGNOSIS — E860 Dehydration: Secondary | ICD-10-CM | POA: Diagnosis not present

## 2020-03-16 DIAGNOSIS — R252 Cramp and spasm: Secondary | ICD-10-CM | POA: Diagnosis not present

## 2020-03-16 DIAGNOSIS — Z5111 Encounter for antineoplastic chemotherapy: Secondary | ICD-10-CM | POA: Insufficient documentation

## 2020-03-16 DIAGNOSIS — C55 Malignant neoplasm of uterus, part unspecified: Secondary | ICD-10-CM | POA: Diagnosis not present

## 2020-03-16 DIAGNOSIS — R634 Abnormal weight loss: Secondary | ICD-10-CM | POA: Insufficient documentation

## 2020-03-16 DIAGNOSIS — G620 Drug-induced polyneuropathy: Secondary | ICD-10-CM | POA: Diagnosis not present

## 2020-03-16 MED ORDER — MAGNESIUM OXIDE 400 (241.3 MG) MG PO TABS: 400.0000 mg | ORAL_TABLET | Freq: Every day | ORAL | 11 refills | Status: DC

## 2020-03-16 NOTE — Telephone Encounter (Signed)
[name] [name] called and said she has had cramping her calves and thighs that started on Friday.  It is hard for her to walk and she feels like she is going to fall.  She is also having nerve pain in her lower back.  She is alternating Tylenol and ibuprofen.  She did call EMS yesterday morning and they talked her out of going to the ED because of the wait time/Covid.    Called her back and scheduled her to see Dr. Alvy Bimler today at 11:20.

## 2020-03-16 NOTE — Progress Notes (Signed)
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[facility]) She tolerated cycle 1 poorly with severe leg cramps, constipation and feeling unwell The leg cramps are gradually improving We discussed the importance of aggressive laxative and supportive care I offered her IV fluids today due to the mild clinical signs of dehydration but the patient declined I will call her again tomorrow to assess on symptom  Other constipation I recommend aggressive laxative with MiraLAX twice a day and to increase Senokot 3 times a day Recommend her to avoid Fleet enema if possible due to risk of neutropenia with treatment  Leg cramps It could be due to side effects of her treatment and possible low potassium I gave her magnesium supplement and recommend high potassium diet I will reduce her premed in the future  Weight loss, non-intentional She has significant weight loss due to poor oral intake Clinically, she appears dehydrated I recommend IV fluids but she declined Recommend the patient to hold her blood pressure medications due to borderline low blood pressure and her symptom of dizziness   No orders of the defined types were placed in this encounter.   All questions were answered. The patient knows to call the clinic with any problems, questions or concerns. The total time spent in the appointment was 30 minutes encounter with patients including review of chart and various tests results, discussions about plan of care and coordination of care plan   Heath Lark, MD 03/16/2020 3:31 PM  INTERVAL HISTORY: Please see below for problem oriented charting. She is seen urgently because she feels unwell Since chemotherapy, she felt bad She has been constipated for 6  days, even before chemotherapy She is only taking 2 Senokot per day She has to take Fleet enema yesterday and this morning and with very little bowel movement She has significant leg cramps in both legs that was severe despite taking acetaminophen She did not take any oxycodone recently She has lost a lot of weight Her oral intake is poor She felt somewhat dizzy No vomiting or nausea No recent fever or chills  SUMMARY OF ONCOLOGIC HISTORY: Oncology History Overview Note  IHC MMR intact MSI-stable Carcinosarcoma   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[place])  01/01/2020 Imaging   CT A/P: 1. Severe distension of the endometrium highly suspicious for cervical obstruction or stenosis possibly due to a neoplastic process in this postmenopausal female with history of vaginal bleeding. Further evaluation with hysteroscopy is  recommended. 2. Diarrheal state. Correlation with clinical exam and stool cultures recommended. No bowel obstruction. Normal appendix.   01/03/2020 Surgery   D&C A. ENDOMETRIUM, CURETTAGE:  -  Poorly differentiated malignancy  -  See comment  COMMENT:  The material consists of a poorly differentiated malignancy with extensive necrosis.  By immunohistochemistry, p16 is positive and there is focal cytokeratin AE1/3 positivity but negative for cytokeratin 7, cytokeratin 5/6, CD20, CD3, p53, WT-1, Pax-8, and S100. The differential includes poorly differentiated carcinoma and high-grade stromal sarcoma.    02/04/2020 Surgery   Robotic-assisted laparoscopic total hysterectomy with bilateral salpingoophorectomy, SLN biopsy, left pelvic LND   On EUA, 10cm mobile uterus. ON intra-abdominal entry, normal upper abdominal survey. Normal appearing omentum, small and large bowel. Uterus 10cm and bulbous. Tortuous and hyperemic ovarian vasculature. Some tubal metaplasia vs. Metastatic disease.  Normal appearing ovaries. Mapping successful to deep right obturator SLN and presacral SLN. NO mapping on  left. Enlarge deep obturator lymph on the left. Given high grade histology, short small bowel mesentery, and recent COVID infection, decision made not to proceed with left PA LND (would have likely required open procedure. NO intra-abdominal or pelvic evidence of disease.    02/04/2020 Pathology Results   FINAL MICROSCOPIC DIAGNOSIS:   A. SENTINEL LYMPH NODE, RIGHT OBTURATOR, EXCISION:  -  No carcinoma identified in one lymph node (0/1)  -  See comment   B. UTERUS AND BILATERAL FALLOPIAN TUBES AND OVARIES, HYSTERECTOMY AND  BILATERAL SALPINGO-OOPHORECTOMY:  -  Carcinosarcoma confined to the uterus and involving less than half of the myometrium  -  See oncology table and comment below   C. LYMPH NODES, LEFT PELVIC, REGIONAL RESECTION:  -  No carcinoma identified in three lymph nodes (0/3)   D. SENTINEL LYMPH NODE, PRESACRAL, EXCISION:  -  No carcinoma identified in one lymph node (0/1)   COMMENT:   A.  Cytokeratin AE1/3 was performed on the sentinel lymph nodes to exclude micrometastasis.  There is no evidence of metastatic carcinoma by immunohistochemistry.   B.  The tumor consists of poorly cohesive epithelioid and rhabdoid cells.  A panel of immunohistochemistry (p16, f, p53, PAX 8, SMA, CD10, CD45, CD 99, CD117, CD138, cytokeratin 7, cyclin D1, cytokeratin AE1/3, EMA and GATA3) is performed to better classify this neoplasm.  By immunohistochemistry, the neoplastic cells are positive for p16, p53 and  PAX 8.  The epithelioid cells are positive for cytokeratin AE1/3 and EMA while the rhabdoid cells show positivity for desmin. Overall, the immunophenotype and morphology supports the diagnosis of carcinosarcoma.    ONCOLOGY TABLE:   UTERUS, CARCINOMA OR CARCINOSARCOMA   Procedure: Total hysterectomy and bilateral salpingo-oophorectomy  Histologic type: Carcinosarcoma  Histologic Grade: FIGO grade 3  Myometrial invasion: Estimated to be less than 50%  Uterine Serosa Involvement: Not  identified  Cervical stromal involvement: Not identified  Extent of involvement of other organs: Not identified  Lymphovascular invasion: Not identified  Regional Lymph Nodes:       Examined:       2 Sentinel                               3 non-sentinel                               5 total        Lymph nodes with metastasis: 0        Isolated tumor cells (<0.2 mm): 0        Micrometastasis:  (>0.2 mm and < 2.0 mm): 0        Macrometastasis: (>2.0 mm): 0        Extracapsular extension: N/A  Representative Tumor Block: B1  MMR / MSI testing: Will be ordered  Pathologic Stage Classification (pTNM, AJCC 8th edition):  pT1a, pN0  Comments: See above    02/04/2020 Cancer Staging   Staging form: Corpus Uteri - Carcinoma and Carcinosarcoma, AJCC 8th Edition - Clinical stage from 02/04/2020: FIGO Stage IA (cT1a, cN0(sn), cM0) - Signed by Lafonda Mosses, MD on 02/12/2020   03/06/2020 Imaging   Placement of single lumen port a cath via right internal jugular vein. The catheter tip lies at the cavo-atrial junction. A power injectable  port a cath was placed and is ready for immediate use.   03/12/2020 -  Chemotherapy   The patient had carboplatin and taxol for chemotherapy treatment.       REVIEW OF SYSTEMS:   Constitutional: Denies fevers, chills  Eyes: Denies blurriness of vision Ears, nose, mouth, throat, and face: Denies mucositis or sore throat Respiratory: Denies cough, dyspnea or wheezes Cardiovascular: Denies palpitation, chest discomfort or lower extremity swelling Skin: Denies abnormal skin rashes Lymphatics: Denies new lymphadenopathy or easy bruising Behavioral/Psych: Mood is stable, no new changes  All other systems were reviewed with the patient and are negative.  I have reviewed the past medical history, past surgical history, social history and family history with the patient and they are unchanged from previous note.  ALLERGIES:  has No Known Allergies.  MEDICATIONS:   Current Outpatient Medications  Medication Sig Dispense Refill  . balsalazide (COLAZAL) 750 MG capsule TAKE 2 CAPSULES (1,500 MG TOTAL) BY MOUTH 2 (TWO) TIMES DAILY. TAKE 2 CAPSULES TWO TIMES DAILY (Patient taking differently: Take 1,500 mg by mouth 2 (two) times daily. ) 360 capsule 0  . calcium carbonate (CALCIUM 600) 600 MG TABS tablet Take 600 mg by mouth daily with breakfast.    . carvedilol (COREG) 3.125 MG tablet TAKE 1 TABLET (3.125 MG TOTAL) BY MOUTH 2 (TWO) TIMES DAILY. 180 tablet 2  . Cholecalciferol (VITAMIN D) 50 MCG (2000 UT) tablet Take 2,000 Units by mouth daily.     . clopidogrel (PLAVIX) 75 MG tablet Take 75 mg by mouth daily. Last dose prior to D and C on 01-03-20     . dexamethasone (DECADRON) 4 MG tablet Take 2 tabs at the night before and 2 tab the morning of chemotherapy, every 3 weeks, by mouth x 6 cycles 36 tablet 6  . docusate sodium (COLACE) 100 MG capsule Take 100 mg by mouth daily.     Marland Kitchen FLUoxetine (PROZAC) 10 MG capsule Take 10 mg by mouth daily.     Marland Kitchen lidocaine-prilocaine (EMLA) cream Apply to affected area once 30 g 3  . LORazepam (ATIVAN) 0.5 MG tablet Take 0.5 mg by mouth as needed for anxiety (for anxiety). Reported on 12/04/2015    . losartan-hydrochlorothiazide (HYZAAR) 100-12.5 MG tablet Take 1 tablet by mouth daily.    . magnesium oxide (MAG-OX) 400 (241.3 Mg) MG tablet Take 1 tablet (400 mg total) by mouth daily. 30 tablet 11  . metFORMIN (GLUCOPHAGE-XR) 500 MG 24 hr tablet Take 500 mg by mouth daily with supper.   5  . Multiple Vitamins-Minerals (MULTIVITAMIN WITH MINERALS) tablet Take 1 tablet by mouth daily.      . nitroGLYCERIN (NITROSTAT) 0.4 MG SL tablet Place 1 tablet (0.4 mg total) under the tongue every 5 (five) minutes as needed for chest pain. 25 tablet 6  . ondansetron (ZOFRAN) 8 MG tablet Take 1 tablet (8 mg total) by mouth every 8 (eight) hours as needed. 30 tablet 1  . pantoprazole (PROTONIX) 40 MG tablet Take 1 tablet (40 mg total) by mouth  daily. Must keep upcoming appt in Nov for future refills 90 tablet 0  . prochlorperazine (COMPAZINE) 10 MG tablet Take 1 tablet (10 mg total) by mouth every 6 (six) hours as needed (Nausea or vomiting). 30 tablet 1  . simvastatin (ZOCOR) 40 MG tablet Take 40 mg by mouth every evening.   1   No current facility-administered medications for this visit.    PHYSICAL EXAMINATION: ECOG PERFORMANCE STATUS: 1 - Symptomatic  but completely ambulatory  Vitals:   03/16/20 1133  BP: 108/66  Pulse: 88  Resp: 18  Temp: (!) 97.4 F (36.3 C)  SpO2: 98%   Filed Weights   03/16/20 1133  Weight: 166 lb (75.3 kg)    GENERAL:alert, no distress and comfortable.  She looks unwell SKIN: skin color, texture, turgor are normal, no rashes or significant lesions EYES: normal, Conjunctiva are pink and non-injected, sclera clear OROPHARYNX:no exudate, no erythema and lips, buccal mucosa, and tongue normal  NECK: supple, thyroid normal size, non-tender, without nodularity LYMPH:  no palpable lymphadenopathy in the cervical, axillary or inguinal LUNGS: clear to auscultation and percussion with normal breathing effort HEART: regular rate & rhythm and no murmurs and no lower extremity edema ABDOMEN:abdomen soft, non-tender and normal bowel sounds Musculoskeletal:no cyanosis of digits and no clubbing  NEURO: alert & oriented x 3 with fluent speech, no focal motor/sensory deficits  LABORATORY DATA:  I have reviewed the data as listed    Component Value Date/Time   NA 143 03/11/2020 1509   K 3.4 (L) 03/11/2020 1509   CL 104 03/11/2020 1509   CO2 28 03/11/2020 1509   GLUCOSE 158 (H) 03/11/2020 1509   BUN 10 03/11/2020 1509   CREATININE 0.82 03/11/2020 1509   CREATININE 0.84 03/24/2015 0938   CALCIUM 9.4 03/11/2020 1509   PROT 7.0 03/11/2020 1509   PROT 6.5 08/17/2017 0859   ALBUMIN 3.8 03/11/2020 1509   ALBUMIN 4.3 08/17/2017 0859   AST 16 03/11/2020 1509   ALT 12 03/11/2020 1509   ALKPHOS 62  03/11/2020 1509   BILITOT 0.7 03/11/2020 1509   GFRNONAA >60 03/11/2020 1509   GFRAA >60 03/11/2020 1509    No results found for: SPEP, UPEP  Lab Results  Component Value Date   WBC 6.3 03/11/2020   NEUTROABS 2.8 03/11/2020   HGB 11.7 (L) 03/11/2020   HCT 35.6 (L) 03/11/2020   MCV 90.8 03/11/2020   PLT 253 03/11/2020      Chemistry      Component Value Date/Time   NA 143 03/11/2020 1509   K 3.4 (L) 03/11/2020 1509   CL 104 03/11/2020 1509   CO2 28 03/11/2020 1509   BUN 10 03/11/2020 1509   CREATININE 0.82 03/11/2020 1509   CREATININE 0.84 03/24/2015 0938      Component Value Date/Time   CALCIUM 9.4 03/11/2020 1509   ALKPHOS 62 03/11/2020 1509   AST 16 03/11/2020 1509   ALT 12 03/11/2020 1509   BILITOT 0.7 03/11/2020 1509

## 2020-03-16 NOTE — Assessment & Plan Note (Signed)
I recommend aggressive laxative with MiraLAX twice a day and to increase Senokot 3 times a day Recommend her to avoid Fleet enema if possible due to risk of neutropenia with treatment

## 2020-03-16 NOTE — Assessment & Plan Note (Signed)
It could be due to side effects of her treatment and possible low potassium I gave her magnesium supplement and recommend high potassium diet I will reduce her premed in the future

## 2020-03-16 NOTE — Assessment & Plan Note (Signed)
She tolerated cycle 1 poorly with severe leg cramps, constipation and feeling unwell The leg cramps are gradually improving We discussed the importance of aggressive laxative and supportive care I offered her IV fluids today due to the mild clinical signs of dehydration but the patient declined I will call her again tomorrow to assess on symptom

## 2020-03-16 NOTE — Assessment & Plan Note (Signed)
She has significant weight loss due to poor oral intake Clinically, she appears dehydrated I recommend IV fluids but she declined Recommend the patient to hold her blood pressure medications due to borderline low blood pressure and her symptom of dizziness

## 2020-03-17 ENCOUNTER — Telehealth: Payer: Self-pay

## 2020-03-17 ENCOUNTER — Inpatient Hospital Stay: Payer: PPO

## 2020-03-17 ENCOUNTER — Telehealth: Payer: Self-pay | Admitting: Gastroenterology

## 2020-03-17 ENCOUNTER — Other Ambulatory Visit: Payer: Self-pay | Admitting: Hematology and Oncology

## 2020-03-17 ENCOUNTER — Other Ambulatory Visit: Payer: Self-pay

## 2020-03-17 VITALS — BP 128/51 | HR 80 | Temp 98.8°F | Resp 18

## 2020-03-17 DIAGNOSIS — C55 Malignant neoplasm of uterus, part unspecified: Secondary | ICD-10-CM

## 2020-03-17 DIAGNOSIS — Z5111 Encounter for antineoplastic chemotherapy: Secondary | ICD-10-CM | POA: Diagnosis not present

## 2020-03-17 MED ORDER — HEPARIN SOD (PORK) LOCK FLUSH 100 UNIT/ML IV SOLN
500.0000 [IU] | Freq: Once | INTRAVENOUS | Status: AC | PRN
  Administered 2020-03-17: 500 [IU]
  Filled 2020-03-17: qty 5

## 2020-03-17 MED ORDER — SODIUM CHLORIDE 0.9 % IV SOLN
Freq: Once | INTRAVENOUS | Status: AC
  Filled 2020-03-17: qty 250

## 2020-03-17 MED ORDER — SODIUM CHLORIDE 0.9% FLUSH
10.0000 mL | Freq: Once | INTRAVENOUS | Status: AC | PRN
  Administered 2020-03-17: 10 mL
  Filled 2020-03-17: qty 10

## 2020-03-17 NOTE — Telephone Encounter (Signed)
-----   Message from Heath Lark, MD sent at 03/17/2020  7:53 AM EDT ----- Regarding: can you call and ask how she is feeling?

## 2020-03-17 NOTE — Patient Instructions (Signed)

## 2020-03-17 NOTE — Telephone Encounter (Signed)
This nurse called pt per Dr Alvy Bimler, and she states she is "having a rough morning" and feels like she may need IVF afterall. Per Dr Alvy Bimler, pt needs to come in for the next 3 days for IVF, and Dr Alvy Bimler will see pt in infusion 10/6. As of right now, pt is to come in today at 1500 for IVF, but no times have been scheduled for 10/6 & 10/7. Pt understands and is in agreement with this plan and understands to be here at 1430 today.

## 2020-03-17 NOTE — Telephone Encounter (Signed)
Please advise Sir, thank you. 

## 2020-03-18 ENCOUNTER — Inpatient Hospital Stay: Payer: PPO

## 2020-03-18 ENCOUNTER — Encounter: Payer: PPO | Admitting: Genetic Counselor

## 2020-03-18 ENCOUNTER — Encounter: Payer: Self-pay | Admitting: Hematology and Oncology

## 2020-03-18 ENCOUNTER — Inpatient Hospital Stay (HOSPITAL_BASED_OUTPATIENT_CLINIC_OR_DEPARTMENT_OTHER): Payer: PPO | Admitting: Hematology and Oncology

## 2020-03-18 ENCOUNTER — Other Ambulatory Visit: Payer: Self-pay

## 2020-03-18 VITALS — BP 167/68 | HR 92 | Temp 97.8°F | Resp 18 | Ht 66.0 in | Wt 170.8 lb

## 2020-03-18 DIAGNOSIS — T451X5A Adverse effect of antineoplastic and immunosuppressive drugs, initial encounter: Secondary | ICD-10-CM | POA: Diagnosis not present

## 2020-03-18 DIAGNOSIS — G620 Drug-induced polyneuropathy: Secondary | ICD-10-CM

## 2020-03-18 DIAGNOSIS — K5909 Other constipation: Secondary | ICD-10-CM

## 2020-03-18 DIAGNOSIS — C55 Malignant neoplasm of uterus, part unspecified: Secondary | ICD-10-CM

## 2020-03-18 DIAGNOSIS — R252 Cramp and spasm: Secondary | ICD-10-CM | POA: Diagnosis not present

## 2020-03-18 DIAGNOSIS — Z5111 Encounter for antineoplastic chemotherapy: Secondary | ICD-10-CM | POA: Diagnosis not present

## 2020-03-18 MED ORDER — SODIUM CHLORIDE 0.9% FLUSH
10.0000 mL | Freq: Once | INTRAVENOUS | Status: AC | PRN
  Administered 2020-03-18: 10 mL
  Filled 2020-03-18: qty 10

## 2020-03-18 MED ORDER — SODIUM CHLORIDE 0.9 % IV SOLN
Freq: Once | INTRAVENOUS | Status: AC
  Filled 2020-03-18: qty 250

## 2020-03-18 MED ORDER — BALSALAZIDE DISODIUM 750 MG PO CAPS: 1500.0000 mg | ORAL_CAPSULE | Freq: Two times a day (BID) | ORAL | 1 refills | Status: DC

## 2020-03-18 MED ORDER — HEPARIN SOD (PORK) LOCK FLUSH 100 UNIT/ML IV SOLN
500.0000 [IU] | Freq: Once | INTRAVENOUS | Status: AC | PRN
  Administered 2020-03-18: 500 [IU]
  Filled 2020-03-18: qty 5

## 2020-03-18 NOTE — Assessment & Plan Note (Signed)
This is likely due to side effects of chemotherapy Observe only for now

## 2020-03-18 NOTE — Telephone Encounter (Signed)
Balsalazide refilled as patient requested. Patient informed and said thank you so much.

## 2020-03-18 NOTE — Patient Instructions (Signed)

## 2020-03-18 NOTE — Assessment & Plan Note (Signed)
Her constipation has resolved She can stop taking laxatives We discussed the importance of starting laxatives early next week after chemo

## 2020-03-18 NOTE — Telephone Encounter (Signed)
OK for 6 months of balsalazide refills

## 2020-03-18 NOTE — Progress Notes (Signed)
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[facility]) Majority of her side effects has resolved After IV fluids today, I plan to discontinue scheduled IV fluids for Friday I will see her back next week as scheduled before cycle 2 of treatment We will continue aggressive supportive care  Other constipation Her constipation has resolved She can stop taking laxatives We discussed the importance of starting laxatives early next week after chemo  Leg cramps Her leg cramps has improved She will continue magnesium supplement  Peripheral neuropathy due to chemotherapy [facility]) This is likely due to side effects of chemotherapy Observe only for now   No orders of the defined types were placed in this encounter.   All questions were answered. The patient knows to call the clinic with any problems, questions or concerns. The total time spent in the appointment was 20 minutes encounter with patients including review of chart and various tests results, discussions about plan of care and coordination of care plan   Heath Lark, MD 03/18/2020 3:12 PM  INTERVAL HISTORY: Please see below for problem oriented charting. She returns for further follow-up She has been getting IV fluids for supportive care She finally have good bowel movement in the past 2 days Her leg cramps has improved She has very mild tingling sensation at the tips of fingers and toes No nausea She felt confident she can eat and drink normal after today  SUMMARY OF ONCOLOGIC HISTORY: Oncology History Overview Note  IHC MMR intact MSI-stable Carcinosarcoma   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[place])  01/01/2020 Imaging   CT A/P: 1. Severe distension of the endometrium highly suspicious for  cervical obstruction or stenosis possibly due to a neoplastic process in this postmenopausal female with history of vaginal bleeding. Further evaluation with hysteroscopy is  recommended. 2. Diarrheal state. Correlation with clinical exam and stool cultures recommended. No bowel obstruction. Normal appendix.   01/03/2020 Surgery   D&C A. ENDOMETRIUM, CURETTAGE:  -  Poorly differentiated malignancy  -  See comment  COMMENT:  The material consists of a poorly differentiated malignancy with extensive necrosis.  By immunohistochemistry, p16 is positive and there is focal cytokeratin AE1/3 positivity but negative for cytokeratin 7, cytokeratin 5/6, CD20, CD3, p53, WT-1, Pax-8, and S100. The differential includes poorly differentiated carcinoma and high-grade stromal sarcoma.    02/04/2020 Surgery   Robotic-assisted laparoscopic total hysterectomy with bilateral salpingoophorectomy, SLN biopsy, left pelvic LND   On EUA, 10cm mobile uterus. ON intra-abdominal entry, normal upper abdominal survey. Normal appearing omentum, small and large bowel. Uterus 10cm and bulbous. Tortuous and hyperemic ovarian vasculature. Some tubal metaplasia vs. Metastatic disease. Normal appearing ovaries. Mapping successful to deep right obturator SLN and presacral SLN. NO mapping on left. Enlarge deep obturator lymph on the left. Given high grade histology, short small bowel mesentery, and recent COVID infection, decision made not to proceed with left PA LND (would have likely required open procedure. NO intra-abdominal or pelvic evidence of disease.    02/04/2020 Pathology Results   FINAL MICROSCOPIC DIAGNOSIS:   A. SENTINEL LYMPH NODE, RIGHT OBTURATOR, EXCISION:  -  No carcinoma identified in one lymph node (0/1)  -  See comment   B. UTERUS AND BILATERAL FALLOPIAN TUBES AND OVARIES, HYSTERECTOMY AND  BILATERAL SALPINGO-OOPHORECTOMY:  -  Carcinosarcoma confined to the uterus and involving less than half of the myometrium   -  See oncology table and comment below   C. LYMPH NODES, LEFT PELVIC, REGIONAL RESECTION:  -  No carcinoma identified in three lymph nodes (0/3)   D. SENTINEL LYMPH NODE, PRESACRAL, EXCISION:  -  No carcinoma identified in one lymph node (0/1)   COMMENT:   A.  Cytokeratin AE1/3 was performed on the sentinel lymph nodes to exclude micrometastasis.  There is no evidence of metastatic carcinoma by immunohistochemistry.   B.  The tumor consists of poorly cohesive epithelioid and rhabdoid cells.  A panel of immunohistochemistry (p16, f, p53, PAX 8, SMA, CD10, CD45, CD 99, CD117, CD138, cytokeratin 7, cyclin D1, cytokeratin AE1/3, EMA and GATA3) is performed to better classify this neoplasm.  By immunohistochemistry, the neoplastic cells are positive for p16, p53 and  PAX 8.  The epithelioid cells are positive for cytokeratin AE1/3 and EMA while the rhabdoid cells show positivity for desmin. Overall, the immunophenotype and morphology supports the diagnosis of carcinosarcoma.    ONCOLOGY TABLE:   UTERUS, CARCINOMA OR CARCINOSARCOMA   Procedure: Total hysterectomy and bilateral salpingo-oophorectomy  Histologic type: Carcinosarcoma  Histologic Grade: FIGO grade 3  Myometrial invasion: Estimated to be less than 50%  Uterine Serosa Involvement: Not identified  Cervical stromal involvement: Not identified  Extent of involvement of other organs: Not identified  Lymphovascular invasion: Not identified  Regional Lymph Nodes:       Examined:       2 Sentinel                               3 non-sentinel                               5 total        Lymph nodes with metastasis: 0        Isolated tumor cells (<0.2 mm): 0        Micrometastasis:  (>0.2 mm and < 2.0 mm): 0        Macrometastasis: (>2.0 mm): 0        Extracapsular extension: N/A  Representative Tumor Block: B1  MMR / MSI testing: Will be ordered  Pathologic Stage Classification (pTNM, AJCC 8th edition):  pT1a, pN0  Comments:  See above    02/04/2020 Cancer Staging   Staging form: Corpus Uteri - Carcinoma and Carcinosarcoma, AJCC 8th Edition - Clinical stage from 02/04/2020: FIGO Stage IA (cT1a, cN0(sn), cM0) - Signed by Lafonda Mosses, MD on 02/12/2020   03/06/2020 Imaging   Placement of single lumen port a cath via right internal jugular vein. The catheter tip lies at the cavo-atrial junction. A power injectable port a cath was placed and is ready for immediate use.   03/12/2020 -  Chemotherapy   The patient had carboplatin and taxol for chemotherapy treatment.       REVIEW OF SYSTEMS:   Constitutional: Denies fevers, chills or abnormal weight loss Eyes: Denies blurriness of vision Ears, nose, mouth, throat, and face: Denies mucositis or sore throat Respiratory: Denies cough, dyspnea or wheezes Cardiovascular: Denies palpitation, chest discomfort or lower extremity swelling Skin: Denies abnormal skin rashes Lymphatics: Denies new lymphadenopathy or easy bruising Behavioral/Psych: Mood is stable, no new changes  All other systems were reviewed with the patient and are negative.  I have  reviewed the past medical history, past surgical history, social history and family history with the patient and they are unchanged from previous note.  ALLERGIES:  has No Known Allergies.  MEDICATIONS:  Current Outpatient Medications  Medication Sig Dispense Refill  . balsalazide (COLAZAL) 750 MG capsule Take 2 capsules (1,500 mg total) by mouth 2 (two) times daily. 360 capsule 1  . calcium carbonate (CALCIUM 600) 600 MG TABS tablet Take 600 mg by mouth daily with breakfast.    . carvedilol (COREG) 3.125 MG tablet TAKE 1 TABLET (3.125 MG TOTAL) BY MOUTH 2 (TWO) TIMES DAILY. 180 tablet 2  . Cholecalciferol (VITAMIN D) 50 MCG (2000 UT) tablet Take 2,000 Units by mouth daily.     . clopidogrel (PLAVIX) 75 MG tablet Take 75 mg by mouth daily. Last dose prior to D and C on 01-03-20     . dexamethasone (DECADRON) 4 MG tablet  Take 2 tabs at the night before and 2 tab the morning of chemotherapy, every 3 weeks, by mouth x 6 cycles 36 tablet 6  . docusate sodium (COLACE) 100 MG capsule Take 100 mg by mouth daily.     Marland Kitchen FLUoxetine (PROZAC) 10 MG capsule Take 10 mg by mouth daily.     Marland Kitchen lidocaine-prilocaine (EMLA) cream Apply to affected area once 30 g 3  . LORazepam (ATIVAN) 0.5 MG tablet Take 0.5 mg by mouth as needed for anxiety (for anxiety). Reported on 12/04/2015    . losartan-hydrochlorothiazide (HYZAAR) 100-12.5 MG tablet Take 1 tablet by mouth daily.    . magnesium oxide (MAG-OX) 400 (241.3 Mg) MG tablet Take 1 tablet (400 mg total) by mouth daily. 30 tablet 11  . metFORMIN (GLUCOPHAGE-XR) 500 MG 24 hr tablet Take 500 mg by mouth daily with supper.   5  . Multiple Vitamins-Minerals (MULTIVITAMIN WITH MINERALS) tablet Take 1 tablet by mouth daily.      . nitroGLYCERIN (NITROSTAT) 0.4 MG SL tablet Place 1 tablet (0.4 mg total) under the tongue every 5 (five) minutes as needed for chest pain. 25 tablet 6  . ondansetron (ZOFRAN) 8 MG tablet Take 1 tablet (8 mg total) by mouth every 8 (eight) hours as needed. 30 tablet 1  . pantoprazole (PROTONIX) 40 MG tablet Take 1 tablet (40 mg total) by mouth daily. Must keep upcoming appt in Nov for future refills 90 tablet 0  . prochlorperazine (COMPAZINE) 10 MG tablet Take 1 tablet (10 mg total) by mouth every 6 (six) hours as needed (Nausea or vomiting). 30 tablet 1  . simvastatin (ZOCOR) 40 MG tablet Take 40 mg by mouth every evening.   1   No current facility-administered medications for this visit.   Facility-Administered Medications Ordered in Other Visits  Medication Dose Route Frequency Provider Last Rate Last Admin  . heparin lock flush 100 unit/mL  500 Units Intracatheter Once PRN Alvy Bimler, Deondrea Aguado, MD      . sodium chloride flush (NS) 0.9 % injection 10 mL  10 mL Intracatheter Once PRN Alvy Bimler, Madalyne Husk, MD        PHYSICAL EXAMINATION: ECOG PERFORMANCE STATUS: 1 - Symptomatic  but completely ambulatory GENERAL:alert, no distress and comfortable NEURO: alert & oriented x 3 with fluent speech, no focal motor/sensory deficits  LABORATORY DATA:  I have reviewed the data as listed    Component Value Date/Time   NA 143 03/11/2020 1509   K 3.4 (L) 03/11/2020 1509   CL 104 03/11/2020 1509   CO2 28 03/11/2020 1509   GLUCOSE  158 (H) 03/11/2020 1509   BUN 10 03/11/2020 1509   CREATININE 0.82 03/11/2020 1509   CREATININE 0.84 03/24/2015 0938   CALCIUM 9.4 03/11/2020 1509   PROT 7.0 03/11/2020 1509   PROT 6.5 08/17/2017 0859   ALBUMIN 3.8 03/11/2020 1509   ALBUMIN 4.3 08/17/2017 0859   AST 16 03/11/2020 1509   ALT 12 03/11/2020 1509   ALKPHOS 62 03/11/2020 1509   BILITOT 0.7 03/11/2020 1509   GFRNONAA >60 03/11/2020 1509   GFRAA >60 03/11/2020 1509    No results found for: SPEP, UPEP  Lab Results  Component Value Date   WBC 6.3 03/11/2020   NEUTROABS 2.8 03/11/2020   HGB 11.7 (L) 03/11/2020   HCT 35.6 (L) 03/11/2020   MCV 90.8 03/11/2020   PLT 253 03/11/2020      Chemistry      Component Value Date/Time   NA 143 03/11/2020 1509   K 3.4 (L) 03/11/2020 1509   CL 104 03/11/2020 1509   CO2 28 03/11/2020 1509   BUN 10 03/11/2020 1509   CREATININE 0.82 03/11/2020 1509   CREATININE 0.84 03/24/2015 0938      Component Value Date/Time   CALCIUM 9.4 03/11/2020 1509   ALKPHOS 62 03/11/2020 1509   AST 16 03/11/2020 1509   ALT 12 03/11/2020 1509   BILITOT 0.7 03/11/2020 1509

## 2020-03-18 NOTE — Assessment & Plan Note (Signed)
Her leg cramps has improved She will continue magnesium supplement

## 2020-03-18 NOTE — Assessment & Plan Note (Signed)
Majority of her side effects has resolved After IV fluids today, I plan to discontinue scheduled IV fluids for Friday I will see her back next week as scheduled before cycle 2 of treatment We will continue aggressive supportive care

## 2020-03-20 ENCOUNTER — Telehealth: Payer: Self-pay | Admitting: Oncology

## 2020-03-20 ENCOUNTER — Ambulatory Visit: Payer: PPO

## 2020-03-20 NOTE — Telephone Encounter (Signed)
Left a voice mail with message below from Dr. Alvy Bimler.

## 2020-03-20 NOTE — Telephone Encounter (Signed)
Charron called and asked if she should continue holding her blood pressure medications.

## 2020-03-20 NOTE — Telephone Encounter (Signed)
Now that she is eating better please resume

## 2020-03-27 ENCOUNTER — Telehealth: Payer: Self-pay | Admitting: *Deleted

## 2020-03-27 NOTE — Telephone Encounter (Signed)
CALLED PATIENT TO INFORM OF NEW HDR [facility], NO ANSWER, UNABLE TO LEAVE MESSAGE

## 2020-04-02 ENCOUNTER — Encounter: Payer: Self-pay | Admitting: Hematology and Oncology

## 2020-04-02 ENCOUNTER — Other Ambulatory Visit: Payer: Self-pay | Admitting: Hematology and Oncology

## 2020-04-02 ENCOUNTER — Inpatient Hospital Stay: Payer: PPO

## 2020-04-02 ENCOUNTER — Inpatient Hospital Stay (HOSPITAL_BASED_OUTPATIENT_CLINIC_OR_DEPARTMENT_OTHER): Payer: PPO | Admitting: Hematology and Oncology

## 2020-04-02 ENCOUNTER — Telehealth: Payer: Self-pay | Admitting: Oncology

## 2020-04-02 ENCOUNTER — Other Ambulatory Visit: Payer: Self-pay

## 2020-04-02 DIAGNOSIS — G620 Drug-induced polyneuropathy: Secondary | ICD-10-CM

## 2020-04-02 DIAGNOSIS — C55 Malignant neoplasm of uterus, part unspecified: Secondary | ICD-10-CM

## 2020-04-02 DIAGNOSIS — R739 Hyperglycemia, unspecified: Secondary | ICD-10-CM | POA: Diagnosis not present

## 2020-04-02 DIAGNOSIS — T451X5D Adverse effect of antineoplastic and immunosuppressive drugs, subsequent encounter: Secondary | ICD-10-CM

## 2020-04-02 DIAGNOSIS — K5909 Other constipation: Secondary | ICD-10-CM | POA: Diagnosis not present

## 2020-04-02 DIAGNOSIS — Z5111 Encounter for antineoplastic chemotherapy: Secondary | ICD-10-CM | POA: Diagnosis not present

## 2020-04-02 DIAGNOSIS — T50905A Adverse effect of unspecified drugs, medicaments and biological substances, initial encounter: Secondary | ICD-10-CM | POA: Diagnosis not present

## 2020-04-02 DIAGNOSIS — T451X5A Adverse effect of antineoplastic and immunosuppressive drugs, initial encounter: Secondary | ICD-10-CM

## 2020-04-02 LAB — CBC WITH DIFFERENTIAL (CANCER CENTER ONLY)
Abs Immature Granulocytes: 0.04 10*3/uL (ref 0.00–0.07)
Basophils Absolute: 0 10*3/uL (ref 0.0–0.1)
Basophils Relative: 0 %
Eosinophils Absolute: 0 10*3/uL (ref 0.0–0.5)
Eosinophils Relative: 0 %
HCT: 36.4 % (ref 36.0–46.0)
Hemoglobin: 12.2 g/dL (ref 12.0–15.0)
Immature Granulocytes: 1 %
Lymphocytes Relative: 16 %
Lymphs Abs: 0.9 10*3/uL (ref 0.7–4.0)
MCH: 29.9 pg (ref 26.0–34.0)
MCHC: 33.5 g/dL (ref 30.0–36.0)
MCV: 89.2 fL (ref 80.0–100.0)
Monocytes Absolute: 0.1 10*3/uL (ref 0.1–1.0)
Monocytes Relative: 2 %
Neutro Abs: 4.4 10*3/uL (ref 1.7–7.7)
Neutrophils Relative %: 81 %
Platelet Count: 300 10*3/uL (ref 150–400)
RBC: 4.08 MIL/uL (ref 3.87–5.11)
RDW: 14 % (ref 11.5–15.5)
WBC Count: 5.4 10*3/uL (ref 4.0–10.5)
nRBC: 0 % (ref 0.0–0.2)

## 2020-04-02 LAB — CMP (CANCER CENTER ONLY)
ALT: 12 U/L (ref 0–44)
AST: 14 U/L — ABNORMAL LOW (ref 15–41)
Albumin: 3.9 g/dL (ref 3.5–5.0)
Alkaline Phosphatase: 78 U/L (ref 38–126)
Anion gap: 13 (ref 5–15)
BUN: 12 mg/dL (ref 8–23)
CO2: 24 mmol/L (ref 22–32)
Calcium: 9.6 mg/dL (ref 8.9–10.3)
Chloride: 102 mmol/L (ref 98–111)
Creatinine: 1.01 mg/dL — ABNORMAL HIGH (ref 0.44–1.00)
GFR, Estimated: 59 mL/min — ABNORMAL LOW (ref >60)
Glucose, Bld: 300 mg/dL — ABNORMAL HIGH (ref 70–99)
Potassium: 3.8 mmol/L (ref 3.5–5.1)
Sodium: 139 mmol/L (ref 135–145)
Total Bilirubin: 0.4 mg/dL (ref 0.3–1.2)
Total Protein: 7.2 g/dL (ref 6.5–8.1)

## 2020-04-02 MED ORDER — DIPHENHYDRAMINE HCL 50 MG/ML IJ SOLN
12.5000 mg | Freq: Once | INTRAMUSCULAR | Status: AC
  Administered 2020-04-02: 12.5 mg via INTRAVENOUS

## 2020-04-02 MED ORDER — SODIUM CHLORIDE 0.9 % IV SOLN
Freq: Once | INTRAVENOUS | Status: AC
  Filled 2020-04-02: qty 250

## 2020-04-02 MED ORDER — SODIUM CHLORIDE 0.9 % IV SOLN
508.2000 mg | Freq: Once | INTRAVENOUS | Status: AC
  Administered 2020-04-02: 510 mg via INTRAVENOUS
  Filled 2020-04-02: qty 51

## 2020-04-02 MED ORDER — SODIUM CHLORIDE 0.9 % IV SOLN
140.0000 mg/m2 | Freq: Once | INTRAVENOUS | Status: AC
  Administered 2020-04-02: 264 mg via INTRAVENOUS
  Filled 2020-04-02: qty 44

## 2020-04-02 MED ORDER — PALONOSETRON HCL INJECTION 0.25 MG/5ML
0.2500 mg | Freq: Once | INTRAVENOUS | Status: AC
  Administered 2020-04-02: 0.25 mg via INTRAVENOUS

## 2020-04-02 MED ORDER — PALONOSETRON HCL INJECTION 0.25 MG/5ML
INTRAVENOUS | Status: AC
  Filled 2020-04-02: qty 5

## 2020-04-02 MED ORDER — SODIUM CHLORIDE 0.9% FLUSH
10.0000 mL | Freq: Once | INTRAVENOUS | Status: AC
  Administered 2020-04-02: 10 mL
  Filled 2020-04-02: qty 10

## 2020-04-02 MED ORDER — FAMOTIDINE IN NACL 20-0.9 MG/50ML-% IV SOLN
INTRAVENOUS | Status: AC
  Filled 2020-04-02: qty 50

## 2020-04-02 MED ORDER — SODIUM CHLORIDE 0.9% FLUSH
10.0000 mL | INTRAVENOUS | Status: DC | PRN
  Administered 2020-04-02: 10 mL
  Filled 2020-04-02: qty 10

## 2020-04-02 MED ORDER — SODIUM CHLORIDE 0.9 % IV SOLN
150.0000 mg | Freq: Once | INTRAVENOUS | Status: AC
  Administered 2020-04-02: 150 mg via INTRAVENOUS
  Filled 2020-04-02: qty 150

## 2020-04-02 MED ORDER — HEPARIN SOD (PORK) LOCK FLUSH 100 UNIT/ML IV SOLN
500.0000 [IU] | Freq: Once | INTRAVENOUS | Status: AC | PRN
  Administered 2020-04-02: 500 [IU]
  Filled 2020-04-02: qty 5

## 2020-04-02 MED ORDER — SODIUM CHLORIDE 0.9 % IV SOLN
10.0000 mg | Freq: Once | INTRAVENOUS | Status: AC
  Administered 2020-04-02: 10 mg via INTRAVENOUS
  Filled 2020-04-02: qty 10

## 2020-04-02 MED ORDER — FAMOTIDINE IN NACL 20-0.9 MG/50ML-% IV SOLN
20.0000 mg | Freq: Once | INTRAVENOUS | Status: AC
  Administered 2020-04-02: 20 mg via INTRAVENOUS

## 2020-04-02 MED ORDER — INSULIN ASPART 100 UNIT/ML ~~LOC~~ SOLN
10.0000 [IU] | Freq: Once | SUBCUTANEOUS | Status: AC
  Administered 2020-04-02: 10 [IU] via SUBCUTANEOUS

## 2020-04-02 MED ORDER — INSULIN ASPART 100 UNIT/ML ~~LOC~~ SOLN
SUBCUTANEOUS | Status: AC
  Filled 2020-04-02: qty 1

## 2020-04-02 MED ORDER — DIPHENHYDRAMINE HCL 50 MG/ML IJ SOLN
INTRAMUSCULAR | Status: AC
  Filled 2020-04-02: qty 1

## 2020-04-02 NOTE — Telephone Encounter (Signed)
Called [name] [name] and advised her that the 04/29/20 HDR treatment has been rescheduled to 1:30 so that she can have enough time to get to her next appointment that day.

## 2020-04-02 NOTE — Assessment & Plan Note (Signed)
We discussed the importance of dietary modification while on treatment She will receive some insulin today

## 2020-04-02 NOTE — Patient Instructions (Signed)

## 2020-04-02 NOTE — Assessment & Plan Note (Signed)
She is doing well in this regard We have extensive discussions about the importance of aggressive laxative therapy

## 2020-04-02 NOTE — Progress Notes (Signed)
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[facility]) She tolerated last cycle of treatment very poorly I plan to reduce Taxol little bit given neuropathy She is also going to receive some insulin today due to severe hyperglycemia likely caused by her steroids along with poor dietary choices I will see her again next month for further follow-up  Drug-induced hyperglycemia We discussed the importance of dietary modification while on treatment She will receive some insulin today  Other constipation She is doing well in this regard We have extensive discussions about the importance of aggressive laxative therapy  Peripheral neuropathy due to chemotherapy ([place]) Plan to order slight dose adjustment on Taxol   No orders of the defined types were placed in this encounter.   All questions were answered. The patient knows to call the clinic with any problems, questions or concerns. The total time spent in the appointment was 30 minutes encounter with patients including review of chart and various tests results, discussions about plan of care and coordination of care plan   Heath Lark, MD 04/02/2020 10:57 AM  INTERVAL HISTORY: Please see below for problem oriented charting. She is seen for further follow-up She has a small stitch on the right side of the neck at previous port placement site which I have removed today She has very slight peripheral neuropathy at the tips of her finger Her constipation is resolved She denies nausea She has lost some weight but she feels healthy She drank some sweet tea this morning before blood draw No recent infection, fever or chills No pain  SUMMARY OF ONCOLOGIC HISTORY: Oncology History Overview Note  IHC MMR  intact MSI-stable Carcinosarcoma   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[place])  01/01/2020 Imaging   CT A/P: 1. Severe distension of the endometrium highly suspicious for cervical obstruction or stenosis possibly due to a neoplastic process in this postmenopausal female with history of vaginal bleeding. Further evaluation with hysteroscopy is  recommended. 2. Diarrheal state. Correlation with clinical exam and stool cultures recommended. No bowel obstruction. Normal appendix.   01/03/2020 Surgery   D&C A. ENDOMETRIUM, CURETTAGE:  -  Poorly differentiated malignancy  -  See comment  COMMENT:  The material consists of a poorly differentiated malignancy with extensive necrosis.  By immunohistochemistry, p16 is positive and there is focal cytokeratin AE1/3 positivity but negative for cytokeratin 7, cytokeratin 5/6, CD20, CD3, p53, WT-1, Pax-8, and S100. The differential includes poorly differentiated carcinoma and high-grade stromal sarcoma.    02/04/2020 Surgery   Robotic-assisted laparoscopic total hysterectomy with bilateral salpingoophorectomy, SLN biopsy, left pelvic LND   On EUA, 10cm mobile uterus. ON intra-abdominal entry, normal upper abdominal survey. Normal appearing omentum, small and large bowel. Uterus 10cm and bulbous. Tortuous and hyperemic ovarian vasculature. Some tubal metaplasia vs. Metastatic disease. Normal appearing ovaries. Mapping successful to deep right obturator SLN and presacral SLN. NO mapping on left. Enlarge deep obturator lymph on the left. Given high grade histology, short small bowel mesentery, and recent COVID infection, decision made not to proceed with left PA LND (would have likely required open procedure. NO intra-abdominal or pelvic evidence of disease.    02/04/2020 Pathology Results   FINAL MICROSCOPIC DIAGNOSIS:   A. SENTINEL LYMPH NODE, RIGHT OBTURATOR, EXCISION:  -  No carcinoma identified in one  lymph node (0/1)  -  See comment   B. UTERUS AND BILATERAL  FALLOPIAN TUBES AND OVARIES, HYSTERECTOMY AND  BILATERAL SALPINGO-OOPHORECTOMY:  -  Carcinosarcoma confined to the uterus and involving less than half of the myometrium  -  See oncology table and comment below   C. LYMPH NODES, LEFT PELVIC, REGIONAL RESECTION:  -  No carcinoma identified in three lymph nodes (0/3)   D. SENTINEL LYMPH NODE, PRESACRAL, EXCISION:  -  No carcinoma identified in one lymph node (0/1)   COMMENT:   A.  Cytokeratin AE1/3 was performed on the sentinel lymph nodes to exclude micrometastasis.  There is no evidence of metastatic carcinoma by immunohistochemistry.   B.  The tumor consists of poorly cohesive epithelioid and rhabdoid cells.  A panel of immunohistochemistry (p16, f, p53, PAX 8, SMA, CD10, CD45, CD 99, CD117, CD138, cytokeratin 7, cyclin D1, cytokeratin AE1/3, EMA and GATA3) is performed to better classify this neoplasm.  By immunohistochemistry, the neoplastic cells are positive for p16, p53 and  PAX 8.  The epithelioid cells are positive for cytokeratin AE1/3 and EMA while the rhabdoid cells show positivity for desmin. Overall, the immunophenotype and morphology supports the diagnosis of carcinosarcoma.    ONCOLOGY TABLE:   UTERUS, CARCINOMA OR CARCINOSARCOMA   Procedure: Total hysterectomy and bilateral salpingo-oophorectomy  Histologic type: Carcinosarcoma  Histologic Grade: FIGO grade 3  Myometrial invasion: Estimated to be less than 50%  Uterine Serosa Involvement: Not identified  Cervical stromal involvement: Not identified  Extent of involvement of other organs: Not identified  Lymphovascular invasion: Not identified  Regional Lymph Nodes:       Examined:       2 Sentinel                               3 non-sentinel                               5 total        Lymph nodes with metastasis: 0        Isolated tumor cells (<0.2 mm): 0        Micrometastasis:  (>0.2 mm and < 2.0 mm): 0        Macrometastasis: (>2.0 mm): 0        Extracapsular  extension: N/A  Representative Tumor Block: B1  MMR / MSI testing: Will be ordered  Pathologic Stage Classification (pTNM, AJCC 8th edition):  pT1a, pN0  Comments: See above    02/04/2020 Cancer Staging   Staging form: Corpus Uteri - Carcinoma and Carcinosarcoma, AJCC 8th Edition - Clinical stage from 02/04/2020: FIGO Stage IA (cT1a, cN0(sn), cM0) - Signed by Lafonda Mosses, MD on 02/12/2020   03/06/2020 Imaging   Placement of single lumen port a cath via right internal jugular vein. The catheter tip lies at the cavo-atrial junction. A power injectable port a cath was placed and is ready for immediate use.   03/12/2020 -  Chemotherapy   The patient had carboplatin and taxol for chemotherapy treatment.       REVIEW OF SYSTEMS:   Constitutional: Denies fevers, chills or abnormal weight loss Eyes: Denies blurriness of vision Ears, nose, mouth, throat, and face: Denies mucositis or sore throat Respiratory: Denies cough, dyspnea or wheezes Cardiovascular: Denies palpitation, chest discomfort or lower extremity swelling Gastrointestinal:  Denies nausea, heartburn or change in  bowel habits Skin: Denies abnormal skin rashes Lymphatics: Denies new lymphadenopathy or easy bruising Behavioral/Psych: Mood is stable, no new changes  All other systems were reviewed with the patient and are negative.  I have reviewed the past medical history, past surgical history, social history and family history with the patient and they are unchanged from previous note.  ALLERGIES:  has No Known Allergies.  MEDICATIONS:  Current Outpatient Medications  Medication Sig Dispense Refill  . balsalazide (COLAZAL) 750 MG capsule Take 2 capsules (1,500 mg total) by mouth 2 (two) times daily. 360 capsule 1  . calcium carbonate (CALCIUM 600) 600 MG TABS tablet Take 600 mg by mouth daily with breakfast.    . carvedilol (COREG) 3.125 MG tablet TAKE 1 TABLET (3.125 MG TOTAL) BY MOUTH 2 (TWO) TIMES DAILY. 180 tablet 2   . Cholecalciferol (VITAMIN D) 50 MCG (2000 UT) tablet Take 2,000 Units by mouth daily.     . clopidogrel (PLAVIX) 75 MG tablet Take 75 mg by mouth daily. Last dose prior to D and C on 01-03-20     . dexamethasone (DECADRON) 4 MG tablet Take 2 tabs at the night before and 2 tab the morning of chemotherapy, every 3 weeks, by mouth x 6 cycles 36 tablet 6  . docusate sodium (COLACE) 100 MG capsule Take 100 mg by mouth daily.     Marland Kitchen FLUoxetine (PROZAC) 10 MG capsule Take 10 mg by mouth daily.     Marland Kitchen lidocaine-prilocaine (EMLA) cream Apply to affected area once 30 g 3  . LORazepam (ATIVAN) 0.5 MG tablet Take 0.5 mg by mouth as needed for anxiety (for anxiety). Reported on 12/04/2015    . losartan-hydrochlorothiazide (HYZAAR) 100-12.5 MG tablet Take 1 tablet by mouth daily.    . magnesium oxide (MAG-OX) 400 (241.3 Mg) MG tablet Take 1 tablet (400 mg total) by mouth daily. 30 tablet 11  . metFORMIN (GLUCOPHAGE-XR) 500 MG 24 hr tablet Take 500 mg by mouth daily with supper.   5  . Multiple Vitamins-Minerals (MULTIVITAMIN WITH MINERALS) tablet Take 1 tablet by mouth daily.      . nitroGLYCERIN (NITROSTAT) 0.4 MG SL tablet Place 1 tablet (0.4 mg total) under the tongue every 5 (five) minutes as needed for chest pain. 25 tablet 6  . ondansetron (ZOFRAN) 8 MG tablet Take 1 tablet (8 mg total) by mouth every 8 (eight) hours as needed. 30 tablet 1  . pantoprazole (PROTONIX) 40 MG tablet Take 1 tablet (40 mg total) by mouth daily. Must keep upcoming appt in Nov for future refills 90 tablet 0  . prochlorperazine (COMPAZINE) 10 MG tablet Take 1 tablet (10 mg total) by mouth every 6 (six) hours as needed (Nausea or vomiting). 30 tablet 1  . simvastatin (ZOCOR) 40 MG tablet Take 40 mg by mouth every evening.   1   No current facility-administered medications for this visit.    PHYSICAL EXAMINATION: ECOG PERFORMANCE STATUS: 1 - Symptomatic but completely ambulatory  Vitals:   04/02/20 1043  BP: 100/82  Pulse: 88   Resp: 18  Temp: 97.6 F (36.4 C)  SpO2: 97%   Filed Weights   04/02/20 1043  Weight: 168 lb (76.2 kg)    GENERAL:alert, no distress and comfortable SKIN: skin color, texture, turgor are normal, no rashes or significant lesions EYES: normal, Conjunctiva are pink and non-injected, sclera clear OROPHARYNX:no exudate, no erythema and lips, buccal mucosa, and tongue normal  NECK: supple, thyroid normal size, non-tender, without nodularity.  I  remove a stitch from recent port placement site LYMPH:  no palpable lymphadenopathy in the cervical, axillary or inguinal LUNGS: clear to auscultation and percussion with normal breathing effort HEART: regular rate & rhythm and no murmurs and no lower extremity edema ABDOMEN:abdomen soft, non-tender and normal bowel sounds Musculoskeletal:no cyanosis of digits and no clubbing  NEURO: alert & oriented x 3 with fluent speech, no focal motor/sensory deficits  LABORATORY DATA:  I have reviewed the data as listed    Component Value Date/Time   NA 139 04/02/2020 0954   K 3.8 04/02/2020 0954   CL 102 04/02/2020 0954   CO2 24 04/02/2020 0954   GLUCOSE 300 (H) 04/02/2020 0954   BUN 12 04/02/2020 0954   CREATININE 1.01 (H) 04/02/2020 0954   CREATININE 0.84 03/24/2015 0938   CALCIUM 9.6 04/02/2020 0954   PROT 7.2 04/02/2020 0954   PROT 6.5 08/17/2017 0859   ALBUMIN 3.9 04/02/2020 0954   ALBUMIN 4.3 08/17/2017 0859   AST 14 (L) 04/02/2020 0954   ALT 12 04/02/2020 0954   ALKPHOS 78 04/02/2020 0954   BILITOT 0.4 04/02/2020 0954   GFRNONAA 59 (L) 04/02/2020 0954   GFRAA >60 03/11/2020 1509    No results found for: SPEP, UPEP  Lab Results  Component Value Date   WBC 5.4 04/02/2020   NEUTROABS 4.4 04/02/2020   HGB 12.2 04/02/2020   HCT 36.4 04/02/2020   MCV 89.2 04/02/2020   PLT 300 04/02/2020      Chemistry      Component Value Date/Time   NA 139 04/02/2020 0954   K 3.8 04/02/2020 0954   CL 102 04/02/2020 0954   CO2 24 04/02/2020  0954   BUN 12 04/02/2020 0954   CREATININE 1.01 (H) 04/02/2020 0954   CREATININE 0.84 03/24/2015 0938      Component Value Date/Time   CALCIUM 9.6 04/02/2020 0954   ALKPHOS 78 04/02/2020 0954   AST 14 (L) 04/02/2020 0954   ALT 12 04/02/2020 0954   BILITOT 0.4 04/02/2020 0954

## 2020-04-02 NOTE — Assessment & Plan Note (Signed)
Plan to order slight dose adjustment on Taxol

## 2020-04-02 NOTE — Progress Notes (Signed)
Met with patient at registration to introduce myself as Financial Resource Specialist and to offer available resources. ° °Discussed one-time $1000 [employer] grant and qualifications to assist with personal expenses while going through treatment. ° °Gave her my card if interested in applying and for any additional financial questions or concerns. °

## 2020-04-02 NOTE — Assessment & Plan Note (Signed)
She tolerated last cycle of treatment very poorly I plan to reduce Taxol little bit given neuropathy She is also going to receive some insulin today due to severe hyperglycemia likely caused by her steroids along with poor dietary choices I will see her again next month for further follow-up

## 2020-04-03 ENCOUNTER — Encounter: Payer: Self-pay | Admitting: Hematology and Oncology

## 2020-04-03 ENCOUNTER — Telehealth: Payer: Self-pay | Admitting: Oncology

## 2020-04-03 NOTE — Telephone Encounter (Signed)
[name] [name] left a message and wanted to let Dr. Alvy Bimler know that everything is good after treatment yesterday.

## 2020-04-03 NOTE — Progress Notes (Signed)
Received call from patient inquiring about [employer].  Discussed what is needed to apply. Patient will bring at next visit.  She has my card for any additional financial questions or concerns.

## 2020-04-07 ENCOUNTER — Telehealth: Payer: Self-pay | Admitting: Oncology

## 2020-04-07 NOTE — Telephone Encounter (Signed)
Typically, I do not check it unless potassium is low. Her recent potassium is ok She can just take magnesium 1 pill per day and see if that helps.

## 2020-04-07 NOTE — Telephone Encounter (Signed)
Called [name] [name] back and she is taking 400 mg of magnesium per day.  Also advised her that she can take pain medication to help with the cramping and that she can also try Tylenol.

## 2020-04-07 NOTE — Telephone Encounter (Signed)
[name] [name] called and reported that she had severe leg cramps in her calves and thighs on Saturday and Sunday.  She said they are better now.  They kept her from getting any rest.  She did try ibuprofen which didn't help and took 1 oxycodone to help her sleep.  She is wondering if her magnesium needs to be increased.  She also reported that her bowels are moving well and she is eating and drinking ok.

## 2020-04-14 ENCOUNTER — Telehealth: Payer: Self-pay | Admitting: *Deleted

## 2020-04-14 NOTE — Telephone Encounter (Signed)
CALLED PATIENT TO INFORM OF NEW HDR [facility], SPOKE WITH PATIENT AND SHE IS AWARE OF THESE APPTS. °

## 2020-04-15 ENCOUNTER — Ambulatory Visit: Payer: PPO | Admitting: Radiation Oncology

## 2020-04-15 ENCOUNTER — Ambulatory Visit: Payer: Self-pay | Admitting: Radiation Oncology

## 2020-04-16 ENCOUNTER — Ambulatory Visit
Admission: RE | Admit: 2020-04-16 | Discharge: 2020-04-16 | Disposition: A | Discharge status: Home / Self Care | Payer: PPO | Source: Ambulatory Visit | Attending: Radiation Oncology | Admitting: Radiation Oncology

## 2020-04-16 ENCOUNTER — Encounter: Payer: Self-pay | Admitting: Radiation Oncology

## 2020-04-16 ENCOUNTER — Other Ambulatory Visit: Payer: Self-pay

## 2020-04-16 VITALS — BP 129/66 | HR 82 | Temp 98.1°F | Resp 18 | Ht 66.0 in | Wt 167.8 lb

## 2020-04-16 DIAGNOSIS — Z7984 Long term (current) use of oral hypoglycemic drugs: Secondary | ICD-10-CM | POA: Insufficient documentation

## 2020-04-16 DIAGNOSIS — R5383 Other fatigue: Secondary | ICD-10-CM | POA: Diagnosis not present

## 2020-04-16 DIAGNOSIS — K5900 Constipation, unspecified: Secondary | ICD-10-CM | POA: Insufficient documentation

## 2020-04-16 DIAGNOSIS — C55 Malignant neoplasm of uterus, part unspecified: Secondary | ICD-10-CM

## 2020-04-16 DIAGNOSIS — R252 Cramp and spasm: Secondary | ICD-10-CM | POA: Diagnosis not present

## 2020-04-16 DIAGNOSIS — Z79899 Other long term (current) drug therapy: Secondary | ICD-10-CM | POA: Insufficient documentation

## 2020-04-16 NOTE — Progress Notes (Signed)
Radiation Oncology         [phone] ________________________________  Name: [name] [name] MRN: [id number]  Date: 04/16/2020  DOB: 1946/04/16  Vaginal Brachytherapy Procedure Note  CC: [name] Ada, MD [name] Mosses, MD    ICD-10-CM   1. 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[place])  C55     Diagnosis: Stage IA (pT1a, pN0) uterine carcinosarcoma  Anticipated radiation Treatment Dates: 04/16/2020, 04/22/2020, 04/29/2020, 05/06/2020, and 05/13/2020.  Narrative: She returns today for vaginal cylinder fitting. She was seen in consultation on 03/11/2020, during which time we discussed proceeding with vaginal brachytherapy given the risk of vaginal cuff recurrence from her aggressive carcinosarcoma. She has since undergone two cycles of adjuvant chemotherapy with Carboplatin and Taxol under the care of Dr. Alvy [name]. She has tolerated treatment poorly secondary to severe leg cramps, neuropathy, constipation, and fatigue. Taxol has been reduced.  On review of systems, she reports no complaints. She denies vaginal bleeding and pelvic pain.  ALLERGIES: has No Known Allergies.  Meds: Current Outpatient Medications  Medication Sig Dispense Refill  . balsalazide (COLAZAL) 750 MG capsule Take 2 capsules (1,500 mg total) by mouth 2 (two) times daily. 360 capsule 1  . calcium carbonate (CALCIUM 600) 600 MG TABS tablet Take 600 mg by mouth daily with breakfast.    . carvedilol (COREG) 3.125 MG tablet TAKE 1 TABLET (3.125 MG TOTAL) BY MOUTH 2 (TWO) TIMES DAILY. 180 tablet 2  . Cholecalciferol (VITAMIN D) 50 MCG (2000 UT) tablet Take 2,000 Units by mouth daily.     . clopidogrel (PLAVIX) 75 MG tablet Take 75 mg by mouth daily. Last dose prior to D and C on 01-03-20     . dexamethasone (DECADRON) 4 MG tablet Take 2 tabs at the night before and 2 tab the morning of chemotherapy, every 3 weeks, by mouth x 6 cycles 36 tablet 6  . docusate sodium (COLACE) 100 MG capsule Take 100 mg by mouth daily.     Marland Kitchen  FLUoxetine (PROZAC) 10 MG capsule Take 10 mg by mouth daily.     Marland Kitchen lidocaine-prilocaine (EMLA) cream Apply to affected area once 30 g 3  . LORazepam (ATIVAN) 0.5 MG tablet Take 0.5 mg by mouth as needed for anxiety (for anxiety). Reported on 12/04/2015    . losartan-hydrochlorothiazide (HYZAAR) 100-12.5 MG tablet Take 1 tablet by mouth daily.    . magnesium oxide (MAG-OX) 400 (241.3 Mg) MG tablet Take 1 tablet (400 mg total) by mouth daily. 30 tablet 11  . magnesium oxide (MAG-OX) 400 MG tablet Take 1 tablet by mouth daily.    . metFORMIN (GLUCOPHAGE-XR) 500 MG 24 hr tablet Take 500 mg by mouth daily with supper.   5  . Multiple Vitamins-Minerals (MULTIVITAMIN WITH MINERALS) tablet Take 1 tablet by mouth daily.      . nitroGLYCERIN (NITROSTAT) 0.4 MG SL tablet Place 1 tablet (0.4 mg total) under the tongue every 5 (five) minutes as needed for chest pain. 25 tablet 6  . ondansetron (ZOFRAN) 8 MG tablet Take 1 tablet (8 mg total) by mouth every 8 (eight) hours as needed. 30 tablet 1  . pantoprazole (PROTONIX) 40 MG tablet Take 1 tablet (40 mg total) by mouth daily. Must keep upcoming appt in Nov for future refills 90 tablet 0  . prochlorperazine (COMPAZINE) 10 MG tablet Take 1 tablet (10 mg total) by mouth every 6 (six) hours as needed (Nausea or vomiting). 30 tablet 1  . simvastatin (ZOCOR) 40 MG tablet Take 40 mg by  mouth every evening.   1   No current facility-administered medications for this encounter.    Physical Findings: The patient is in no acute distress. Patient is alert and oriented.  height is 5' 6"  (1.676 m) and weight is 167 lb 12.8 oz (76.1 kg). Her temperature is 98.1 F (36.7 C). Her blood pressure is 129/66 and her pulse is 82. Her respiration is 18 and oxygen saturation is 98%.   No palpable cervical, supraclavicular or axillary lymphoadenopathy. The heart has a regular rate and rhythm. The lungs are clear to auscultation. Abdomen soft and non-tender.  On pelvic examination  the external genitalia were unremarkable. A speculum exam was performed. Vaginal cuff intact, no mucosal lesions. On bimanual exam there were no pelvic masses appreciated.  Lab Findings: Lab Results  Component Value Date   WBC 5.4 04/02/2020   HGB 12.2 04/02/2020   HCT 36.4 04/02/2020   MCV 89.2 04/02/2020   PLT 300 04/02/2020    Radiographic Findings: No results found.  Impression: Stage IA (pT1a, pN0) uterine carcinosarcoma  Patient was fitted for a vaginal cylinder. The patient will be treated with a 2.5 cm diameter cylinder with a treatment length of 2.5 cm. This distended the vaginal vault without undue discomfort. The patient tolerated the procedure well.  The patient was successfully fitted for a vaginal cylinder. The patient is appropriate to begin vaginal brachytherapy.   Plan: The patient will proceed with CT simulation and vaginal brachytherapy today.    _______________________________   Blair Promise, PhD, MD  This document serves as a record of services personally performed by Gery Pray, MD. It was created on his behalf by Clerance Lav, a trained medical scribe. The creation of this record is based on the scribe's personal observations and the provider's statements to them. This document has been checked and approved by the attending provider.

## 2020-04-16 NOTE — Progress Notes (Signed)
Patient here for a [facility] HDR with Dr. Sondra Come. Patient denies pain or bleeding. Has had 2 chemo treatments and has had severe leg cramping 2-3 days after.  BP 129/66 (BP Location: Left Arm, Patient Position: Sitting, Cuff Size: Normal)   Pulse 82   Temp 98.1 F (36.7 C)   Resp 18   Ht 5' 6"  (1.676 m)   Wt 167 lb 12.8 oz (76.1 kg)   SpO2 98%   BMI 27.08 kg/m   Wt Readings from Last 3 Encounters:  04/16/20 167 lb 12.8 oz (76.1 kg)  04/02/20 168 lb (76.2 kg)  03/18/20 170 lb 12.8 oz (77.5 kg)

## 2020-04-16 NOTE — Progress Notes (Signed)
  Radiation Oncology         [phone] ________________________________  Name: [name] [name] MRN: [id number]  Date: 04/16/2020  DOB: 04-03-1946  CC: [name], MD  [name], MD  HDR BRACHYTHERAPY NOTE  DIAGNOSIS: Stage IA (pT1a, pN0) uterine carcinosarcoma   Simple treatment device note: Patient had construction of her custom vaginal cylinder. She will be treated with a 2.5 cm diameter segmented cylinder. This conforms to her anatomy without undue discomfort.  Vaginal brachytherapy procedure node: The patient was brought to the [facility] suite. Identity was confirmed. All relevant records and images related to the planned course of therapy were reviewed. The patient freely provided informed written consent to proceed with treatment after reviewing the details related to the planned course of therapy. The consent form was witnessed and verified by the simulation staff. Then, the patient was set-up in a stable reproducible supine position for radiation therapy. Pelvic exam revealed the vaginal cuff to be intact . The patient's custom vaginal cylinder was placed in the proximal vagina. This was affixed to the CT/MR stabilization plate to prevent slippage. Patient tolerated the placement well.  Verification simulation note:  A fiducial marker was placed within the vaginal cylinder. An AP and lateral film was then obtained through the pelvis area. This documented accurate position of the vaginal cylinder for treatment.  HDR BRACHYTHERAPY TREATMENT  The remote afterloading device was affixed to the vaginal cylinder by catheter. Patient then proceeded to undergo her first high-dose-rate treatment directed at the proximal vagina. The patient was prescribed a dose of 6.0 gray to be delivered to the mucosal surface. Treatment length was 2.5 cm. Patient was treated with 1 channel using 6 dwell positions. Treatment time was 157.3 seconds. Iridium 192 was the high-dose-rate source for treatment.  The patient tolerated the treatment well. After completion of her therapy, a radiation survey was performed documenting return of the iridium source into the [employer] safe.   PLAN: The patient will return next week for her second high-dose-rate treatment. ________________________________    Blair Promise, PhD, MD  This document serves as a record of services personally performed by Gery Pray, MD. It was created on his behalf by Clerance Lav, a trained medical scribe. The creation of this record is based on the scribe's personal observations and the provider's statements to them. This document has been checked and approved by the attending provider.

## 2020-04-20 ENCOUNTER — Other Ambulatory Visit: Payer: Self-pay | Admitting: Cardiovascular Disease

## 2020-04-21 ENCOUNTER — Telehealth: Payer: Self-pay | Admitting: *Deleted

## 2020-04-21 NOTE — Telephone Encounter (Signed)
CALLED PATIENT TO REMIND OF HDR TX. FOR 04-22-20 @ 1 PM, SPOKE WITH PATIENT AND SHE IS AWARE OF THIS [place].

## 2020-04-22 ENCOUNTER — Ambulatory Visit
Admission: RE | Admit: 2020-04-22 | Discharge: 2020-04-22 | Disposition: A | Discharge status: Home / Self Care | Payer: PPO | Source: Ambulatory Visit | Attending: Radiation Oncology | Admitting: Radiation Oncology

## 2020-04-22 DIAGNOSIS — C55 Malignant neoplasm of uterus, part unspecified: Secondary | ICD-10-CM | POA: Diagnosis not present

## 2020-04-22 NOTE — Progress Notes (Signed)
  Radiation Oncology         [phone] ________________________________  Name: [name] [name] MRN: [id number]  Date: 04/22/2020  DOB: Mar 11, 1946  CC: [name], MD  [name], MD  HDR BRACHYTHERAPY NOTE  DIAGNOSIS: Stage IA (pT1a, pN0) uterine carcinosarcoma   Simple treatment device note: Patient had construction of her custom vaginal cylinder. She will be treated with a 2.5 cm diameter segmented cylinder. This conforms to her anatomy without undue discomfort.  Vaginal brachytherapy procedure node: The patient was brought to the [place] suite. Identity was confirmed. All relevant records and images related to the planned course of therapy were reviewed. The patient freely provided informed written consent to proceed with treatment after reviewing the details related to the planned course of therapy. The consent form was witnessed and verified by the simulation staff. Then, the patient was set-up in a stable reproducible supine position for radiation therapy. Pelvic exam revealed the vaginal cuff to be intact . The patient's custom vaginal cylinder was placed in the proximal vagina. This was affixed to the CT/MR stabilization plate to prevent slippage. Patient tolerated the placement well.  Verification simulation note:  A fiducial marker was placed within the vaginal cylinder. An AP and lateral film was then obtained through the pelvis area. This documented accurate position of the vaginal cylinder for treatment.  HDR BRACHYTHERAPY TREATMENT  The remote afterloading device was affixed to the vaginal cylinder by catheter. Patient then proceeded to undergo her second high-dose-rate treatment directed at the proximal vagina. The patient was prescribed a dose of 6.0 gray to be delivered to the mucosal surface. Treatment length was 2.5 cm. Patient was treated with 1 channel using 6 dwell positions. Treatment time was 166.4 seconds. Iridium 192 was the high-dose-rate source for treatment.  The patient tolerated the treatment well. After completion of her therapy, a radiation survey was performed documenting return of the iridium source into the [employer] safe.   PLAN: The patient will return next week for her third high-dose-rate treatment. ________________________________    Blair Promise, PhD, MD  This document serves as a record of services personally performed by Gery Pray, MD. It was created on his behalf by Clerance Lav, a trained medical scribe. The creation of this record is based on the scribe's personal observations and the provider's statements to them. This document has been checked and approved by the attending provider.

## 2020-04-23 ENCOUNTER — Inpatient Hospital Stay: Payer: PPO

## 2020-04-23 ENCOUNTER — Encounter: Payer: Self-pay | Admitting: Hematology and Oncology

## 2020-04-23 ENCOUNTER — Inpatient Hospital Stay: Payer: PPO | Attending: Gynecologic Oncology

## 2020-04-23 ENCOUNTER — Inpatient Hospital Stay (HOSPITAL_BASED_OUTPATIENT_CLINIC_OR_DEPARTMENT_OTHER): Payer: PPO | Admitting: Hematology and Oncology

## 2020-04-23 ENCOUNTER — Other Ambulatory Visit: Payer: Self-pay | Admitting: Hematology and Oncology

## 2020-04-23 ENCOUNTER — Other Ambulatory Visit: Payer: Self-pay

## 2020-04-23 DIAGNOSIS — F419 Anxiety disorder, unspecified: Secondary | ICD-10-CM | POA: Insufficient documentation

## 2020-04-23 DIAGNOSIS — G620 Drug-induced polyneuropathy: Secondary | ICD-10-CM | POA: Diagnosis not present

## 2020-04-23 DIAGNOSIS — K219 Gastro-esophageal reflux disease without esophagitis: Secondary | ICD-10-CM | POA: Insufficient documentation

## 2020-04-23 DIAGNOSIS — Z7902 Long term (current) use of antithrombotics/antiplatelets: Secondary | ICD-10-CM | POA: Diagnosis not present

## 2020-04-23 DIAGNOSIS — I10 Essential (primary) hypertension: Secondary | ICD-10-CM | POA: Diagnosis not present

## 2020-04-23 DIAGNOSIS — F329 Major depressive disorder, single episode, unspecified: Secondary | ICD-10-CM | POA: Insufficient documentation

## 2020-04-23 DIAGNOSIS — Z7982 Long term (current) use of aspirin: Secondary | ICD-10-CM | POA: Insufficient documentation

## 2020-04-23 DIAGNOSIS — Z5111 Encounter for antineoplastic chemotherapy: Secondary | ICD-10-CM | POA: Diagnosis not present

## 2020-04-23 DIAGNOSIS — T50905D Adverse effect of unspecified drugs, medicaments and biological substances, subsequent encounter: Secondary | ICD-10-CM

## 2020-04-23 DIAGNOSIS — Z79899 Other long term (current) drug therapy: Secondary | ICD-10-CM | POA: Insufficient documentation

## 2020-04-23 DIAGNOSIS — R252 Cramp and spasm: Secondary | ICD-10-CM | POA: Insufficient documentation

## 2020-04-23 DIAGNOSIS — C55 Malignant neoplasm of uterus, part unspecified: Secondary | ICD-10-CM

## 2020-04-23 DIAGNOSIS — Z90722 Acquired absence of ovaries, bilateral: Secondary | ICD-10-CM | POA: Diagnosis not present

## 2020-04-23 DIAGNOSIS — I252 Old myocardial infarction: Secondary | ICD-10-CM | POA: Diagnosis not present

## 2020-04-23 DIAGNOSIS — T451X5A Adverse effect of antineoplastic and immunosuppressive drugs, initial encounter: Secondary | ICD-10-CM | POA: Diagnosis not present

## 2020-04-23 DIAGNOSIS — E119 Type 2 diabetes mellitus without complications: Secondary | ICD-10-CM | POA: Diagnosis not present

## 2020-04-23 DIAGNOSIS — Z90710 Acquired absence of both cervix and uterus: Secondary | ICD-10-CM | POA: Insufficient documentation

## 2020-04-23 DIAGNOSIS — Z7984 Long term (current) use of oral hypoglycemic drugs: Secondary | ICD-10-CM | POA: Diagnosis not present

## 2020-04-23 DIAGNOSIS — Z951 Presence of aortocoronary bypass graft: Secondary | ICD-10-CM | POA: Insufficient documentation

## 2020-04-23 DIAGNOSIS — T50905A Adverse effect of unspecified drugs, medicaments and biological substances, initial encounter: Secondary | ICD-10-CM

## 2020-04-23 DIAGNOSIS — Z87891 Personal history of nicotine dependence: Secondary | ICD-10-CM | POA: Insufficient documentation

## 2020-04-23 DIAGNOSIS — K5909 Other constipation: Secondary | ICD-10-CM

## 2020-04-23 DIAGNOSIS — G5600 Carpal tunnel syndrome, unspecified upper limb: Secondary | ICD-10-CM | POA: Insufficient documentation

## 2020-04-23 DIAGNOSIS — R739 Hyperglycemia, unspecified: Secondary | ICD-10-CM | POA: Diagnosis not present

## 2020-04-23 LAB — CMP (CANCER CENTER ONLY)
ALT: 11 U/L (ref 0–44)
AST: 17 U/L (ref 15–41)
Albumin: 3.9 g/dL (ref 3.5–5.0)
Alkaline Phosphatase: 68 U/L (ref 38–126)
Anion gap: 15 (ref 5–15)
BUN: 15 mg/dL (ref 8–23)
CO2: 22 mmol/L (ref 22–32)
Calcium: 9.3 mg/dL (ref 8.9–10.3)
Chloride: 102 mmol/L (ref 98–111)
Creatinine: 1.01 mg/dL — ABNORMAL HIGH (ref 0.44–1.00)
GFR, Estimated: 59 mL/min — ABNORMAL LOW (ref >60)
Glucose, Bld: 254 mg/dL — ABNORMAL HIGH (ref 70–99)
Potassium: 3.8 mmol/L (ref 3.5–5.1)
Sodium: 139 mmol/L (ref 135–145)
Total Bilirubin: 0.6 mg/dL (ref 0.3–1.2)
Total Protein: 7.2 g/dL (ref 6.5–8.1)

## 2020-04-23 LAB — CBC WITH DIFFERENTIAL (CANCER CENTER ONLY)
Abs Immature Granulocytes: 0.03 10*3/uL (ref 0.00–0.07)
Basophils Absolute: 0 10*3/uL (ref 0.0–0.1)
Basophils Relative: 0 %
Eosinophils Absolute: 0 10*3/uL (ref 0.0–0.5)
Eosinophils Relative: 0 %
HCT: 34.4 % — ABNORMAL LOW (ref 36.0–46.0)
Hemoglobin: 11.6 g/dL — ABNORMAL LOW (ref 12.0–15.0)
Immature Granulocytes: 1 %
Lymphocytes Relative: 13 %
Lymphs Abs: 0.7 10*3/uL (ref 0.7–4.0)
MCH: 30.5 pg (ref 26.0–34.0)
MCHC: 33.7 g/dL (ref 30.0–36.0)
MCV: 90.5 fL (ref 80.0–100.0)
Monocytes Absolute: 0.1 10*3/uL (ref 0.1–1.0)
Monocytes Relative: 1 %
Neutro Abs: 4.7 10*3/uL (ref 1.7–7.7)
Neutrophils Relative %: 85 %
Platelet Count: 252 10*3/uL (ref 150–400)
RBC: 3.8 MIL/uL — ABNORMAL LOW (ref 3.87–5.11)
RDW: 15.2 % (ref 11.5–15.5)
WBC Count: 5.5 10*3/uL (ref 4.0–10.5)
nRBC: 0 % (ref 0.0–0.2)

## 2020-04-23 MED ORDER — DIPHENHYDRAMINE HCL 50 MG/ML IJ SOLN
INTRAMUSCULAR | Status: AC
  Filled 2020-04-23: qty 1

## 2020-04-23 MED ORDER — FAMOTIDINE IN NACL 20-0.9 MG/50ML-% IV SOLN
20.0000 mg | Freq: Once | INTRAVENOUS | Status: AC
  Administered 2020-04-23: 20 mg via INTRAVENOUS

## 2020-04-23 MED ORDER — DEXAMETHASONE 4 MG PO TABS: ORAL_TABLET | ORAL | 6 refills | Status: DC

## 2020-04-23 MED ORDER — FAMOTIDINE IN NACL 20-0.9 MG/50ML-% IV SOLN
INTRAVENOUS | Status: AC
  Filled 2020-04-23: qty 50

## 2020-04-23 MED ORDER — PALONOSETRON HCL INJECTION 0.25 MG/5ML
0.2500 mg | Freq: Once | INTRAVENOUS | Status: AC
  Administered 2020-04-23: 0.25 mg via INTRAVENOUS

## 2020-04-23 MED ORDER — INSULIN ASPART 100 UNIT/ML ~~LOC~~ SOLN
SUBCUTANEOUS | Status: AC
  Filled 2020-04-23: qty 1

## 2020-04-23 MED ORDER — PALONOSETRON HCL INJECTION 0.25 MG/5ML
INTRAVENOUS | Status: AC
  Filled 2020-04-23: qty 5

## 2020-04-23 MED ORDER — SODIUM CHLORIDE 0.9 % IV SOLN
150.0000 mg | Freq: Once | INTRAVENOUS | Status: AC
  Administered 2020-04-23: 150 mg via INTRAVENOUS
  Filled 2020-04-23: qty 150

## 2020-04-23 MED ORDER — HEPARIN SOD (PORK) LOCK FLUSH 100 UNIT/ML IV SOLN
500.0000 [IU] | Freq: Once | INTRAVENOUS | Status: AC | PRN
  Administered 2020-04-23: 500 [IU]
  Filled 2020-04-23: qty 5

## 2020-04-23 MED ORDER — SODIUM CHLORIDE 0.9 % IV SOLN
140.0000 mg/m2 | Freq: Once | INTRAVENOUS | Status: AC
  Administered 2020-04-23: 264 mg via INTRAVENOUS
  Filled 2020-04-23: qty 44

## 2020-04-23 MED ORDER — SODIUM CHLORIDE 0.9 % IV SOLN
Freq: Once | INTRAVENOUS | Status: AC
  Filled 2020-04-23: qty 250

## 2020-04-23 MED ORDER — SODIUM CHLORIDE 0.9% FLUSH
10.0000 mL | Freq: Once | INTRAVENOUS | Status: AC
  Administered 2020-04-23: 10 mL
  Filled 2020-04-23: qty 10

## 2020-04-23 MED ORDER — SODIUM CHLORIDE 0.9 % IV SOLN
10.0000 mg | Freq: Once | INTRAVENOUS | Status: AC
  Administered 2020-04-23: 10 mg via INTRAVENOUS
  Filled 2020-04-23: qty 10

## 2020-04-23 MED ORDER — SODIUM CHLORIDE 0.9% FLUSH
10.0000 mL | INTRAVENOUS | Status: DC | PRN
  Administered 2020-04-23: 10 mL
  Filled 2020-04-23: qty 10

## 2020-04-23 MED ORDER — INSULIN ASPART 100 UNIT/ML ~~LOC~~ SOLN
10.0000 [IU] | Freq: Once | SUBCUTANEOUS | Status: AC
  Administered 2020-04-23: 10 [IU] via SUBCUTANEOUS

## 2020-04-23 MED ORDER — SODIUM CHLORIDE 0.9 % IV SOLN
508.2000 mg | Freq: Once | INTRAVENOUS | Status: AC
  Administered 2020-04-23: 510 mg via INTRAVENOUS
  Filled 2020-04-23: qty 51

## 2020-04-23 NOTE — Assessment & Plan Note (Signed)
The peripheral neuropathy is not consistent with isolated chemotherapy-induced peripheral neuropathy She has carpal tunnel syndrome on the right hand I recommend ice treatment during chemotherapy

## 2020-04-23 NOTE — Progress Notes (Signed)
[place] OFFICE PROGRESS NOTE  Patient Care Team: [name], MD as PCP - General (Family Medicine) Burnell Blanks, MD as PCP - Cardiology (Cardiology) Awanda Mink Craige Cotta, RN as Oncology Nurse Navigator (Oncology)  ASSESSMENT & PLAN:  Carcinosarcoma of uterus [facility]) She continues to experience multiple different side effects with cramps, neuropathy, hyperglycemia and others I plan to omit premed Benadryl We will proceed with similar dose as before I also recommend omitting morning premedication dexamethasone for next visit She received 1 dose of insulin today   Drug-induced hyperglycemia We discussed the importance of dietary modification while on treatment She will receive some insulin today I also plan to omit oral premedication dexamethasone the morning of chemotherapy  Peripheral neuropathy due to chemotherapy [facility]) The peripheral neuropathy is not consistent with isolated chemotherapy-induced peripheral neuropathy She has carpal tunnel syndrome on the right hand I recommend ice treatment during chemotherapy  Other constipation She is doing well in this regard We have extensive discussions about the importance of aggressive laxative therapy  Leg cramps This is likely triggered by side effects of treatment I will omit Benadryl I recommend her to take pain medicine as needed for 2 to 3 days after chemotherapy   No orders of the defined types were placed in this encounter.   All questions were answered. The patient knows to call the clinic with any problems, questions or concerns. The total time spent in the appointment was 30 minutes encounter with patients including review of chart and various tests results, discussions about plan of care and coordination of care plan   Heath Lark, MD 04/23/2020 10:54 AM  INTERVAL HISTORY: Please see below for problem oriented charting. She returns for cycle 3 of chemotherapy She tolerated last cycle of  treatment better in terms of neuropathy except on her right hand Constipation is better controlled with scheduled MiraLAX She continues to suffer from severe bilateral lower extremity cramps 48 hours after chemotherapy The pain is excruciating in nature She denies nausea No recent infection, fever or chills  SUMMARY OF ONCOLOGIC HISTORY: Oncology History Overview Note  IHC MMR intact MSI-stable Carcinosarcoma   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[place])  01/01/2020 Imaging   CT A/P: 1. Severe distension of the endometrium highly suspicious for cervical obstruction or stenosis possibly due to a neoplastic process in this postmenopausal female with history of vaginal bleeding. Further evaluation with hysteroscopy is  recommended. 2. Diarrheal state. Correlation with clinical exam and stool cultures recommended. No bowel obstruction. Normal appendix.   01/03/2020 Surgery   D&C A. ENDOMETRIUM, CURETTAGE:  -  Poorly differentiated malignancy  -  See comment  COMMENT:  The material consists of a poorly differentiated malignancy with extensive necrosis.  By immunohistochemistry, p16 is positive and there is focal cytokeratin AE1/3 positivity but negative for cytokeratin 7, cytokeratin 5/6, CD20, CD3, p53, WT-1, Pax-8, and S100. The differential includes poorly differentiated carcinoma and high-grade stromal sarcoma.    02/04/2020 Surgery   Robotic-assisted laparoscopic total hysterectomy with bilateral salpingoophorectomy, SLN biopsy, left pelvic LND   On EUA, 10cm mobile uterus. ON intra-abdominal entry, normal upper abdominal survey. Normal appearing omentum, small and large bowel. Uterus 10cm and bulbous. Tortuous and hyperemic ovarian vasculature. Some tubal metaplasia vs. Metastatic disease. Normal appearing ovaries. Mapping successful to deep right obturator SLN and presacral SLN. NO mapping on left. Enlarge deep obturator lymph on the left. Given high grade histology, short small bowel mesentery, and  recent COVID infection, decision made not to proceed with  left PA LND (would have likely required open procedure. NO intra-abdominal or pelvic evidence of disease.    02/04/2020 Pathology Results   FINAL MICROSCOPIC DIAGNOSIS:   A. SENTINEL LYMPH NODE, RIGHT OBTURATOR, EXCISION:  -  No carcinoma identified in one lymph node (0/1)  -  See comment   B. UTERUS AND BILATERAL FALLOPIAN TUBES AND OVARIES, HYSTERECTOMY AND  BILATERAL SALPINGO-OOPHORECTOMY:  -  Carcinosarcoma confined to the uterus and involving less than half of the myometrium  -  See oncology table and comment below   C. LYMPH NODES, LEFT PELVIC, REGIONAL RESECTION:  -  No carcinoma identified in three lymph nodes (0/3)   D. SENTINEL LYMPH NODE, PRESACRAL, EXCISION:  -  No carcinoma identified in one lymph node (0/1)   COMMENT:   A.  Cytokeratin AE1/3 was performed on the sentinel lymph nodes to exclude micrometastasis.  There is no evidence of metastatic carcinoma by immunohistochemistry.   B.  The tumor consists of poorly cohesive epithelioid and rhabdoid cells.  A panel of immunohistochemistry (p16, f, p53, PAX 8, SMA, CD10, CD45, CD 99, CD117, CD138, cytokeratin 7, cyclin D1, cytokeratin AE1/3, EMA and GATA3) is performed to better classify this neoplasm.  By immunohistochemistry, the neoplastic cells are positive for p16, p53 and  PAX 8.  The epithelioid cells are positive for cytokeratin AE1/3 and EMA while the rhabdoid cells show positivity for desmin. Overall, the immunophenotype and morphology supports the diagnosis of carcinosarcoma.    ONCOLOGY TABLE:   UTERUS, CARCINOMA OR CARCINOSARCOMA   Procedure: Total hysterectomy and bilateral salpingo-oophorectomy  Histologic type: Carcinosarcoma  Histologic Grade: FIGO grade 3  Myometrial invasion: Estimated to be less than 50%  Uterine Serosa Involvement: Not identified  Cervical stromal involvement: Not identified  Extent of involvement of other organs: Not  identified  Lymphovascular invasion: Not identified  Regional Lymph Nodes:       Examined:       2 Sentinel                               3 non-sentinel                               5 total        Lymph nodes with metastasis: 0        Isolated tumor cells (<0.2 mm): 0        Micrometastasis:  (>0.2 mm and < 2.0 mm): 0        Macrometastasis: (>2.0 mm): 0        Extracapsular extension: N/A  Representative Tumor Block: B1  MMR / MSI testing: Will be ordered  Pathologic Stage Classification (pTNM, AJCC 8th edition):  pT1a, pN0  Comments: See above    02/04/2020 Cancer Staging   Staging form: Corpus Uteri - Carcinoma and Carcinosarcoma, AJCC 8th Edition - Clinical stage from 02/04/2020: FIGO Stage IA (cT1a, cN0(sn), cM0) - Signed by Lafonda Mosses, MD on 02/12/2020   03/06/2020 Imaging   Placement of single lumen port a cath via right internal jugular vein. The catheter tip lies at the cavo-atrial junction. A power injectable port a cath was placed and is ready for immediate use.   03/12/2020 -  Chemotherapy   The patient had carboplatin and taxol for chemotherapy treatment.       REVIEW OF SYSTEMS:   Constitutional: Denies fevers, chills  or abnormal weight loss Eyes: Denies blurriness of vision Ears, nose, mouth, throat, and face: Denies mucositis or sore throat Respiratory: Denies cough, dyspnea or wheezes Cardiovascular: Denies palpitation, chest discomfort or lower extremity swelling Skin: Denies abnormal skin rashes LympBehavioral/Psych: Mood is stable, no new changes  All other systems were reviewed with the patient and are negative.  I have reviewed the past medical history, past surgical history, social history and family history with the patient and they are unchanged from previous note.  ALLERGIES:  has No Known Allergies.  MEDICATIONS:  Current Outpatient Medications  Medication Sig Dispense Refill  . balsalazide (COLAZAL) 750 MG capsule Take 2 capsules (1,500 mg  total) by mouth 2 (two) times daily. 360 capsule 1  . calcium carbonate (CALCIUM 600) 600 MG TABS tablet Take 600 mg by mouth daily with breakfast.    . carvedilol (COREG) 3.125 MG tablet TAKE 1 TABLET (3.125 MG TOTAL) BY MOUTH 2 (TWO) TIMES DAILY. 180 tablet 2  . Cholecalciferol (VITAMIN D) 50 MCG (2000 UT) tablet Take 2,000 Units by mouth daily.     . clopidogrel (PLAVIX) 75 MG tablet Take 75 mg by mouth daily. Last dose prior to D and C on 01-03-20     . dexamethasone (DECADRON) 4 MG tablet Take 2 tabs at the night before chemotherapy, every 3 weeks, by mouth x 6 cycles 36 tablet 6  . docusate sodium (COLACE) 100 MG capsule Take 100 mg by mouth daily.     Marland Kitchen FLUoxetine (PROZAC) 10 MG capsule Take 10 mg by mouth daily.     Marland Kitchen lidocaine-prilocaine (EMLA) cream Apply to affected area once 30 g 3  . LORazepam (ATIVAN) 0.5 MG tablet Take 0.5 mg by mouth as needed for anxiety (for anxiety). Reported on 12/04/2015    . losartan-hydrochlorothiazide (HYZAAR) 100-12.5 MG tablet Take 1 tablet by mouth daily.    . magnesium oxide (MAG-OX) 400 (241.3 Mg) MG tablet Take 1 tablet (400 mg total) by mouth daily. 30 tablet 11  . metFORMIN (GLUCOPHAGE-XR) 500 MG 24 hr tablet Take 500 mg by mouth daily with supper.   5  . Multiple Vitamins-Minerals (MULTIVITAMIN WITH MINERALS) tablet Take 1 tablet by mouth daily.      . nitroGLYCERIN (NITROSTAT) 0.4 MG SL tablet Place 1 tablet (0.4 mg total) under the tongue every 5 (five) minutes as needed for chest pain. 25 tablet 6  . ondansetron (ZOFRAN) 8 MG tablet Take 1 tablet (8 mg total) by mouth every 8 (eight) hours as needed. 30 tablet 1  . pantoprazole (PROTONIX) 40 MG tablet Take 1 tablet (40 mg total) by mouth daily. Must keep upcoming appt in Nov for future refills 90 tablet 0  . prochlorperazine (COMPAZINE) 10 MG tablet Take 1 tablet (10 mg total) by mouth every 6 (six) hours as needed (Nausea or vomiting). 30 tablet 1  . simvastatin (ZOCOR) 40 MG tablet Take 40 mg by  mouth every evening.   1   No current facility-administered medications for this visit.    PHYSICAL EXAMINATION: ECOG PERFORMANCE STATUS: 1 - Symptomatic but completely ambulatory  Vitals:   04/23/20 1021  BP: (!) 146/75  Pulse: 97  Resp: 20  Temp: 97.7 F (36.5 C)  SpO2: 98%   Filed Weights   04/23/20 1021  Weight: 167 lb 12.8 oz (76.1 kg)    GENERAL:alert, no distress and comfortable SKIN: skin color, texture, turgor are normal, no rashes or significant lesions EYES: normal, Conjunctiva are pink and non-injected, sclera  clear OROPHARYNX:no exudate, no erythema and lips, buccal mucosa, and tongue normal  NECK: supple, thyroid normal size, non-tender, without nodularity LYMPH:  no palpable lymphadenopathy in the cervical, axillary or inguinal LUNGS: clear to auscultation and percussion with normal breathing effort HEART: regular rate & rhythm and no murmurs and no lower extremity edema ABDOMEN:abdomen soft, non-tender and normal bowel sounds Musculoskeletal:no cyanosis of digits and no clubbing  NEURO: alert & oriented x 3 with fluent speech, no focal motor/sensory deficits  LABORATORY DATA:  I have reviewed the data as listed    Component Value Date/Time   NA 139 04/23/2020 1000   K 3.8 04/23/2020 1000   CL 102 04/23/2020 1000   CO2 22 04/23/2020 1000   GLUCOSE 254 (H) 04/23/2020 1000   BUN 15 04/23/2020 1000   CREATININE 1.01 (H) 04/23/2020 1000   CREATININE 0.84 03/24/2015 0938   CALCIUM 9.3 04/23/2020 1000   PROT 7.2 04/23/2020 1000   PROT 6.5 08/17/2017 0859   ALBUMIN 3.9 04/23/2020 1000   ALBUMIN 4.3 08/17/2017 0859   AST 17 04/23/2020 1000   ALT 11 04/23/2020 1000   ALKPHOS 68 04/23/2020 1000   BILITOT 0.6 04/23/2020 1000   GFRNONAA 59 (L) 04/23/2020 1000   GFRAA >60 03/11/2020 1509    No results found for: SPEP, UPEP  Lab Results  Component Value Date   WBC 5.5 04/23/2020   NEUTROABS 4.7 04/23/2020   HGB 11.6 (L) 04/23/2020   HCT 34.4 (L)  04/23/2020   MCV 90.5 04/23/2020   PLT 252 04/23/2020      Chemistry      Component Value Date/Time   NA 139 04/23/2020 1000   K 3.8 04/23/2020 1000   CL 102 04/23/2020 1000   CO2 22 04/23/2020 1000   BUN 15 04/23/2020 1000   CREATININE 1.01 (H) 04/23/2020 1000   CREATININE 0.84 03/24/2015 0938      Component Value Date/Time   CALCIUM 9.3 04/23/2020 1000   ALKPHOS 68 04/23/2020 1000   AST 17 04/23/2020 1000   ALT 11 04/23/2020 1000   BILITOT 0.6 04/23/2020 1000

## 2020-04-23 NOTE — Assessment & Plan Note (Signed)
She is doing well in this regard We have extensive discussions about the importance of aggressive laxative therapy

## 2020-04-23 NOTE — Assessment & Plan Note (Addendum)
We discussed the importance of dietary modification while on treatment She will receive some insulin today I also plan to omit oral premedication dexamethasone the morning of chemotherapy

## 2020-04-23 NOTE — Patient Instructions (Signed)
[facility] Discharge Instructions for Patients Receiving Chemotherapy  Today you received the following chemotherapy agents: Taxol, Carboplatin   To help prevent nausea and vomiting after your treatment, we encourage you to take your nausea medication as directed.    If you develop nausea and vomiting that is not controlled by your nausea medication, call the clinic.   BELOW ARE SYMPTOMS THAT SHOULD BE REPORTED IMMEDIATELY:  *FEVER GREATER THAN 100.5 F  *CHILLS WITH OR WITHOUT FEVER  NAUSEA AND VOMITING THAT IS NOT CONTROLLED WITH YOUR NAUSEA MEDICATION  *UNUSUAL SHORTNESS OF BREATH  *UNUSUAL BRUISING OR BLEEDING  TENDERNESS IN MOUTH AND THROAT WITH OR WITHOUT PRESENCE OF ULCERS  *URINARY PROBLEMS  *BOWEL PROBLEMS  UNUSUAL RASH Items with * indicate a potential emergency and should be followed up as soon as possible.  Feel free to call the clinic should you have any questions or concerns. The clinic phone number is [phone].  Please show the [place] at check-in to the Emergency Department and triage nurse.

## 2020-04-23 NOTE — Progress Notes (Signed)
Met with patient at registration to obtain signature for grant approval.  Patient approved for one-time $1000 [employer] grant to assist with personal expenses while going through treatment.She has a copy of the approval letter and expense sheet along with the Outpatient pharmacy information.Discussed in detail expenses and how they are covered. Patient received a gift card today from her grant.   She has my card for any additional financial questions or concerns.

## 2020-04-23 NOTE — Assessment & Plan Note (Signed)
This is likely triggered by side effects of treatment I will omit Benadryl I recommend her to take pain medicine as needed for 2 to 3 days after chemotherapy

## 2020-04-23 NOTE — Assessment & Plan Note (Signed)
She continues to experience multiple different side effects with cramps, neuropathy, hyperglycemia and others I plan to omit premed Benadryl We will proceed with similar dose as before I also recommend omitting morning premedication dexamethasone for next visit She received 1 dose of insulin today

## 2020-04-27 ENCOUNTER — Telehealth: Payer: Self-pay | Admitting: Oncology

## 2020-04-27 NOTE — Telephone Encounter (Signed)
Called [name] [name] and advised her of HDR treatment for 05/13/20 has been rescheduled to 05/11/20 at 3 pm.  She verbalized agreement.

## 2020-04-27 NOTE — Telephone Encounter (Signed)
Called [name] [name] back with message below from Dr. Alvy Bimler.  She verbalized understanding.  She also asked if her radiation and chemo treatments can be spaced out.  She said the last ones where within a day of each other which was hard on her.  Reviewed her scheduled and her last HDR treatment is on 12/1 with chemo the next day on 12/2.  Advised her that I will work on having them rescheduled further apart.

## 2020-04-27 NOTE — Telephone Encounter (Signed)
If it is over a small area (size of the patch or less) ok to use I would not recommend using over a large area (not using multiple patches to cover a large area)

## 2020-04-27 NOTE — Telephone Encounter (Signed)
[name] [name] left a message and said she is still having leg cramps and is wondering if she can use Salonpas patches on her legs.

## 2020-04-27 NOTE — Telephone Encounter (Signed)
She can try; not sure if that would be helpful

## 2020-04-27 NOTE — Telephone Encounter (Signed)
Called Song back and advised her that she can use Salonpas.  She said her stepdaughter brought her some lidocaine patches (5%) and is wondering if it is ok to use them.  Advised her that I will check with Dr. Alvy Bimler and call her back.

## 2020-04-28 ENCOUNTER — Telehealth: Payer: Self-pay | Admitting: Oncology

## 2020-04-28 ENCOUNTER — Telehealth: Payer: Self-pay | Admitting: *Deleted

## 2020-04-28 NOTE — Telephone Encounter (Signed)
Called [name] [name] back with new appointments for 06/08/20.  She verbalized understanding and agreement.

## 2020-04-28 NOTE — Telephone Encounter (Signed)
Kess called and asked if she can move her infusion on 06/04/20 at 06/08/20 so that she won't feel bad over Christmas.

## 2020-04-28 NOTE — Telephone Encounter (Signed)
Called patient to remind of HDR Tx. for 04-29-20 @ 1:30 pm, spoke with patient and she is aware of this tx.

## 2020-04-28 NOTE — Telephone Encounter (Signed)
Yes, pls move alll appt from 12/23 to 12/27

## 2020-04-29 ENCOUNTER — Ambulatory Visit
Admission: RE | Admit: 2020-04-29 | Discharge: 2020-04-29 | Disposition: A | Discharge status: Home / Self Care | Payer: PPO | Source: Ambulatory Visit | Attending: Radiation Oncology | Admitting: Radiation Oncology

## 2020-04-29 ENCOUNTER — Ambulatory Visit: Payer: PPO | Admitting: Cardiovascular Disease

## 2020-04-29 ENCOUNTER — Other Ambulatory Visit: Payer: Self-pay

## 2020-04-29 ENCOUNTER — Encounter: Payer: Self-pay | Admitting: Cardiovascular Disease

## 2020-04-29 VITALS — BP 118/70 | HR 104 | Ht 66.0 in | Wt 162.4 lb

## 2020-04-29 DIAGNOSIS — E7800 Pure hypercholesterolemia, unspecified: Secondary | ICD-10-CM

## 2020-04-29 DIAGNOSIS — I10 Essential (primary) hypertension: Secondary | ICD-10-CM | POA: Diagnosis not present

## 2020-04-29 DIAGNOSIS — I2510 Atherosclerotic heart disease of native coronary artery without angina pectoris: Secondary | ICD-10-CM | POA: Diagnosis not present

## 2020-04-29 DIAGNOSIS — C55 Malignant neoplasm of uterus, part unspecified: Secondary | ICD-10-CM | POA: Diagnosis not present

## 2020-04-29 MED ORDER — CARVEDILOL 3.125 MG PO TABS
3.1250 mg | ORAL_TABLET | Freq: Two times a day (BID) | ORAL | 3 refills | Status: DC
Start: 2020-04-29 — End: 2021-02-23

## 2020-04-29 MED ORDER — NITROGLYCERIN 0.4 MG SL SUBL: 0.4000 mg | SUBLINGUAL_TABLET | SUBLINGUAL | 3 refills | Status: DC | PRN

## 2020-04-29 MED ORDER — CLOPIDOGREL BISULFATE 75 MG PO TABS
75.0000 mg | ORAL_TABLET | Freq: Every day | ORAL | 3 refills | Status: DC
Start: 2020-04-29 — End: 2021-02-23

## 2020-04-29 MED ORDER — SIMVASTATIN 40 MG PO TABS
40.0000 mg | ORAL_TABLET | Freq: Every evening | ORAL | 3 refills | Status: DC
Start: 2020-04-29 — End: 2021-02-23

## 2020-04-29 NOTE — Progress Notes (Signed)
Chief Complaint  Patient presents with  . Follow-up    CAD   History of Present Illness: 74 yo female with h/o CAD with acute anterior MI April 1996 with angioplasty, CABG June 1996 (LIMA to LAD, SVG to RCA), stenting Circumflex 2016, HLD, HTN, ulcerative colitis here today for cardiology followup. Last cardiac cath in May 2016 and she was found to have a severe stenosis in the mid Circumflex treated with a drug eluting stent. The LIMA was patent to the LAD and the SVG was patent to the PDA. Echo January 2019 with RFFM=38-46%, grade 2 diastolic dysfunction. She has uterine cancer and is undergoing chemotherapy and radiation therapy. She had a total hysterectomy.   She is here today for follow up. The patient denies any chest pain, dyspnea, palpitations, lower extremity edema, orthopnea, PND, dizziness, near syncope or syncope. No exertional chest pain.   Primary Care Physician: [name], MD  Past Medical History:  Diagnosis Date  . Adenomatous colon polyp 11/2005  . Anginal pain ([place])    last time 2016  . Anxiety   . Arthritis   . CAD (coronary artery disease)    2V CABG 1996 (LIMA to LAD, SVG to RCA) Dr. Servando Snare.; last stress test in 2010  . Diabetes mellitus without complication ([place])    type II  . GERD (gastroesophageal reflux disease)   . Hyperlipidemia   . Hypertension   . Muscle cramps    and pains  . Myocardial infarction ([place])    1996  . Pneumonia    hx of x 2   . Ulcerative colitis   . Uterine cancer [facility])     Past Surgical History:  Procedure Laterality Date  . BACK SURGERY     lumbar fusion 25 years ago and ruptured disk 10 years ago same area  . BACK SURGERY     ruptered disc under lumbar disc  . CARDIAC CATHETERIZATION N/A 10/13/2014   Procedure: Left Heart Cath And Coronary Angiography;  Surgeon: Burnell Blanks, MD;  Location: [place] CV [facility] CUPID;  Service: Cardiovascular;  Laterality: N/A;  . CARDIAC CATHETERIZATION N/A 10/13/2014    Procedure: Coronary Stent Intervention;  Surgeon: Burnell Blanks, MD;  Location: [place] INVASIVE CV LAB CUPID;  Service: Cardiovascular;  Laterality: N/A;  . COLONOSCOPY    . CORONARY ANGIOPLASTY WITH STENT PLACEMENT  10/13/2014   DES to [place]  . CORONARY ARTERY BYPASS GRAFT  1996   x 2  . DILATATION & CURETTAGE/HYSTEROSCOPY WITH MYOSURE N/A 01/03/2020   Procedure: DILATATION & CURETTAGE/HYSTEROSCOPY WITH POSSIBLE MYOSURE;  Surgeon: Paula Compton, MD;  Location: [place];  Service: Gynecology;  Laterality: N/A;  . DILATION AND EVACUATION N/A 01/03/2020   Procedure: DILATATION AND EVACUATION;  Surgeon: Paula Compton, MD;  Location: [place];  Service: Gynecology;  Laterality: N/A;  . IR IMAGING GUIDED PORT INSERTION  03/06/2020  . OPERATIVE ULTRASOUND N/A 01/03/2020   Procedure: OPERATIVE ULTRASOUND;  Surgeon: Paula Compton, MD;  Location: [place];  Service: Gynecology;  Laterality: N/A;  . POLYPECTOMY    . ROBOTIC ASSISTED TOTAL HYSTERECTOMY WITH BILATERAL SALPINGO OOPHERECTOMY Bilateral 02/04/2020   Procedure: XI ROBOTIC ASSISTED TOTAL HYSTERECTOMY WITH BILATERAL SALPINGO OOPHORECTOMY, LYMPH NODE DISSECTION;  Surgeon: Lafonda Mosses, MD;  Location: [facility];  Service: Gynecology;  Laterality: Bilateral;  . SENTINEL NODE BIOPSY N/A 02/04/2020   Procedure: SENTINEL NODE BIOPSY;  Surgeon: Lafonda Mosses, MD;  Location: [facility];  Service: Gynecology;  Laterality: N/A;  .  UPPER GASTROINTESTINAL ENDOSCOPY      Current Outpatient Medications  Medication Sig Dispense Refill  . balsalazide (COLAZAL) 750 MG capsule Take 2 capsules (1,500 mg total) by mouth 2 (two) times daily. 360 capsule 1  . calcium carbonate (CALCIUM 600) 600 MG TABS tablet Take 600 mg by mouth daily with breakfast.    . carvedilol (COREG) 3.125 MG tablet Take 1 tablet (3.125 mg total) by mouth 2 (two) times daily. 180 tablet 3  . Cholecalciferol (VITAMIN D) 50 MCG (2000 UT) tablet Take 2,000 Units by mouth  daily.     . clopidogrel (PLAVIX) 75 MG tablet Take 1 tablet (75 mg total) by mouth daily. 90 tablet 3  . dexamethasone (DECADRON) 4 MG tablet Take 2 tabs at the night before chemotherapy, every 3 weeks, by mouth x 6 cycles 36 tablet 6  . docusate sodium (COLACE) 100 MG capsule Take 100 mg by mouth daily.     Marland Kitchen FLUoxetine (PROZAC) 10 MG capsule Take 10 mg by mouth daily.     Marland Kitchen lidocaine-prilocaine (EMLA) cream Apply to affected area once 30 g 3  . LORazepam (ATIVAN) 0.5 MG tablet Take 0.5 mg by mouth as needed for anxiety (for anxiety). Reported on 12/04/2015    . losartan-hydrochlorothiazide (HYZAAR) 100-12.5 MG tablet Take 1 tablet by mouth daily.    . magnesium oxide (MAG-OX) 400 (241.3 Mg) MG tablet Take 1 tablet (400 mg total) by mouth daily. 30 tablet 11  . metFORMIN (GLUCOPHAGE-XR) 500 MG 24 hr tablet Take 500 mg by mouth daily with supper.   5  . Multiple Vitamins-Minerals (MULTIVITAMIN WITH MINERALS) tablet Take 1 tablet by mouth daily.      . nitroGLYCERIN (NITROSTAT) 0.4 MG SL tablet Place 1 tablet (0.4 mg total) under the tongue every 5 (five) minutes as needed for chest pain. 25 tablet 3  . ondansetron (ZOFRAN) 8 MG tablet Take 1 tablet (8 mg total) by mouth every 8 (eight) hours as needed. 30 tablet 1  . pantoprazole (PROTONIX) 40 MG tablet Take 1 tablet (40 mg total) by mouth daily. Must keep upcoming appt in Nov for future refills 90 tablet 0  . prochlorperazine (COMPAZINE) 10 MG tablet Take 1 tablet (10 mg total) by mouth every 6 (six) hours as needed (Nausea or vomiting). 30 tablet 1  . simvastatin (ZOCOR) 40 MG tablet Take 1 tablet (40 mg total) by mouth every evening. 90 tablet 3   No current facility-administered medications for this visit.    No Known Allergies  Social History   Socioeconomic History  . Marital status: Widowed    Spouse name: Ray  . Number of children: 7  . Years of education: 60  . Highest education level: Not on file  Occupational History  .  Occupation: retired  Tobacco Use  . Smoking status: Former Smoker    Years: 20.00    Quit date: 10/04/1994    Years since quitting: 25.5  . Smokeless tobacco: Never Used  Vaping Use  . Vaping Use: Never used  Substance and Sexual Activity  . Alcohol use: No  . Drug use: No  . Sexual activity: Not Currently    Partners: Male  Other Topics Concern  . Not on file  Social History Narrative  . Not on file   Social Determinants of Health   Financial Resource Strain:   . Difficulty of Paying Living Expenses: Not on file  Food Insecurity:   . Worried About Charity fundraiser in the  Last Year: Not on file  . Ran Out of Food in the Last Year: Not on file  Transportation Needs:   . Lack of Transportation (Medical): Not on file  . Lack of Transportation (Non-Medical): Not on file  Physical Activity:   . Days of Exercise per Week: Not on file  . Minutes of Exercise per Session: Not on file  Stress:   . Feeling of Stress : Not on file  Social Connections:   . Frequency of Communication with Friends and Family: Not on file  . Frequency of Social Gatherings with Friends and Family: Not on file  . Attends Religious Services: Not on file  . Active Member of Clubs or Organizations: Not on file  . Attends Archivist Meetings: Not on file  . Marital Status: Not on file  Intimate Partner Violence:   . Fear of Current or Ex-Partner: Not on file  . Emotionally Abused: Not on file  . Physically Abused: Not on file  . Sexually Abused: Not on file    Family History  Problem Relation Age of Onset  . Diabetes Mother   . Heart disease Mother   . Heart attack Mother 59  . Hypertension Mother   . Lung cancer Brother   . Colon cancer Brother   . Breast cancer Sister   . Kidney disease Brother        renal cancer  . Heart disease Sister   . Colon cancer Daughter        Thinks her daughter had negative genetic testing  . Hypertension Sister   . Stroke Sister   . Pancreatitis  Other   . Breast cancer Daughter   . Stomach cancer Neg Hx     Review of Systems:  As stated in the HPI and otherwise negative.   BP 118/70   Pulse (!) 104   Ht 5' 6"  (1.676 m)   Wt 162 lb 6.4 oz (73.7 kg)   SpO2 94%   BMI 26.21 kg/m   Physical Examination:  General: Well developed, well nourished, NAD  HEENT: OP clear, mucus membranes moist  SKIN: warm, dry. No rashes. Neuro: No focal deficits  Musculoskeletal: Muscle strength 5/5 all ext  Psychiatric: Mood and affect normal  Neck: No JVD, no carotid bruits, no thyromegaly, no lymphadenopathy.  Lungs:Clear bilaterally, no wheezes, rhonci, crackles Cardiovascular: Regular rate and rhythm. No murmurs, gallops or rubs. Abdomen:Soft. Bowel sounds present. Non-tender.  Extremities: No lower extremity edema. Pulses are 2 + in the bilateral DP/PT.  Echo January 2019: - Left ventricle: The cavity size was normal. Wall thickness was   normal. Systolic function was normal. The estimated ejection   fraction was in the range of 60% to 65%. Wall motion was normal;   there were no regional wall motion abnormalities. Features are   consistent with a pseudonormal left ventricular filling pattern,   with concomitant abnormal relaxation and increased filling   pressure (grade 2 diastolic dysfunction).  Cardiac cath Oct 13, 2014: Left main: Diffuse 20% stenosis.  Left Anterior Descending Artery: Large caliber vessel that courses to the apex. The proximal vessel is occluded. The mid and distal vessel fills from the patent graft.  Circumflex Artery: Moderate caliber vessel with moderate caliber obtuse marginal branch. The mid vessel has a 99% stenosis.  Right Coronary Artery: Large dominant vessel with 100% mid occlusion. The distal vessel fills from the patent vein graft.  Graft Anatomy:  SVG to PDA is patent LIMA to mid  LAD is patent Left Ventricular Angiogram: LVEF=60-65%.   EKG:  EKG is  ordered today. The ekg ordered today  demonstrates  Sinus tachycardia, rate 104 bpm  Recent Labs: 04/23/2020: ALT 11; BUN 15; Creatinine 1.01; Hemoglobin 11.6; Platelet Count 252; Potassium 3.8; Sodium 139   Lipid Panel    Component Value Date/Time   CHOL 147 08/17/2017 0859   TRIG 79 08/17/2017 0859   HDL 55 08/17/2017 0859   CHOLHDL 2.7 08/17/2017 0859   CHOLHDL 3.0 03/24/2015 0938   VLDL 12 03/24/2015 0938   LDLCALC 76 08/17/2017 0859     Wt Readings from Last 3 Encounters:  04/29/20 162 lb 6.4 oz (73.7 kg)  04/23/20 167 lb 12.8 oz (76.1 kg)  04/16/20 167 lb 12.8 oz (76.1 kg)     Other studies Reviewed: Additional studies/ records that were reviewed today include:  Review of the above records demonstrates:    Assessment and Plan:   1. CAD without angina: She is s/p 2V CABG in 1996. Last cardiac cath May 2016 at which time a DES was placed in the mid Circumflex. LIMA graft was patent to the LAD and the SVG was patent to the PDA. Echo January 2019 with normal LV function. No chest pain suggestive of angina. Continue Plavix, statin and beta blocker.    2. HTN: BP is controlled. No changes.   3. Hyperlipidemia: LDL near goal in 2019. Last check in February 2021 with LDL 117.  She needs repeat lipids and LFTs. She will do this in primary care. Continue statin.     Current medicines are reviewed at length with the patient today.  The patient does not have concerns regarding medicines.  The following changes have been made:  no change  Labs/ tests ordered today include:  Orders Placed This Encounter  Procedures  . EKG 12-Lead    Disposition:   FU with me in 12 months.   Signed, Lauree Chandler, MD 04/29/2020 3:24 PM    [facility] [address], [place], [place]  [address] Phone: [phone]; Fax: [phone]

## 2020-04-29 NOTE — Patient Instructions (Signed)
Medication Instructions:  No changes *If you need a refill on your cardiac medications before your next appointment, please call your pharmacy*   Lab Work: none If you have labs (blood work) drawn today and your tests are completely normal, you will receive your results only by: Marland Kitchen MyChart Message (if you have MyChart) OR . A paper copy in the mail If you have any lab test that is abnormal or we need to change your treatment, we will call you to review the results.   Testing/Procedures: none   Follow-Up: At [facility], you and your health needs are our priority.  As part of our continuing mission to provide you with exceptional heart care, we have created designated Provider Care Teams.  These Care Teams include your primary Cardiologist (physician) and Advanced Practice Providers (APPs -  Physician Assistants and Nurse Practitioners) who all work together to provide you with the care you need, when you need it.   Your next appointment:   12 month(s)  The format for your next appointment:   In Person  Provider:   You may see Lauree Chandler, MD or one of the following Advanced Practice Providers on your designated Care Team:    Melina Copa, PA-C  Ermalinda Barrios, PA-C    Other Instructions

## 2020-04-29 NOTE — Progress Notes (Signed)
  Radiation Oncology         [phone] ________________________________  Name: [name] [name] MRN: [id number]  Date: 04/29/2020  DOB: [birth year]  CC: [name], MD  [name], MD  HDR BRACHYTHERAPY NOTE  DIAGNOSIS: Stage IA (pT1a, pN0) uterine carcinosarcoma   Simple treatment device note: Patient had construction of her custom vaginal cylinder. She will be treated with a 2.5 cm diameter segmented cylinder. This conforms to her anatomy without undue discomfort.  Vaginal brachytherapy procedure node: The patient was brought to the [place] suite. Identity was confirmed. All relevant records and images related to the planned course of therapy were reviewed. The patient freely provided informed written consent to proceed with treatment after reviewing the details related to the planned course of therapy. The consent form was witnessed and verified by the simulation staff. Then, the patient was set-up in a stable reproducible supine position for radiation therapy. Pelvic exam revealed the vaginal cuff to be intact . The patient's custom vaginal cylinder was placed in the proximal vagina. This was affixed to the CT/MR stabilization plate to prevent slippage. Patient tolerated the placement well.  Verification simulation note:  A fiducial marker was placed within the vaginal cylinder. An AP and lateral film was then obtained through the pelvis area. This documented accurate position of the vaginal cylinder for treatment.  HDR BRACHYTHERAPY TREATMENT  The remote afterloading device was affixed to the vaginal cylinder by catheter. Patient then proceeded to undergo her third high-dose-rate treatment directed at the proximal vagina. The patient was prescribed a dose of 6.0 gray to be delivered to the mucosal surface. Treatment length was 2.5 cm. Patient was treated with 1 channel using 6 dwell positions. Treatment time was 177.8 seconds. Iridium 192 was the high-dose-rate source for treatment.  The patient tolerated the treatment well. After completion of her therapy, a radiation survey was performed documenting return of the iridium source into the GammaMed safe.   PLAN: The patient will return next week for her fourth high-dose-rate treatment.    She reported some discomfort in the left lower quadrant/suprapubic area after her last radiation treatment and chemotherapy.  The discomfort persists but does not require any pain medication in terms of severity.  she denies any nausea constipation or diarrhea.  On exam the abdomen is soft nontender with normal bowel sounds.  No palpable mass in this area. ________________________________    Blair Promise, PhD, MD  This document serves as a record of services personally performed by Gery Pray, MD. It was created on his behalf by Clerance Lav, a trained medical scribe. The creation of this record is based on the scribe's personal observations and the provider's statements to them. This document has been checked and approved by the attending provider.

## 2020-05-04 ENCOUNTER — Telehealth: Payer: Self-pay | Admitting: Oncology

## 2020-05-04 NOTE — Telephone Encounter (Signed)
Called [name] [name] back and advised her of message from Dr. Alvy Bimler.  She verbalized agreement and said she is just trying to find something that will help with the leg cramps the second and third day after chemotherapy.  She said the only thing that helps so far is heat from a heating pad.  She keeps oxycodone for the last resort because she lives alone and is afraid of falling or something happening while she is taking it.  Advised her to discuss this with Dr. Alvy Bimler at her next appointment on 05/14/20.

## 2020-05-04 NOTE — Telephone Encounter (Signed)
I do not believe it would help The quinine might interfere with metabolism of chemo

## 2020-05-04 NOTE — Telephone Encounter (Signed)
[name] [name] left a message asking if it is ok to drink tonic water with quinine for her leg cramps.

## 2020-05-05 ENCOUNTER — Telehealth: Payer: Self-pay | Admitting: *Deleted

## 2020-05-05 NOTE — Telephone Encounter (Signed)
CALLED PATIENT TO REMIND OF HDR TX. FOR 05-06-20 @ 11 AM, LVM FOR A RETURN CALL

## 2020-05-06 ENCOUNTER — Telehealth: Payer: Self-pay | Admitting: *Deleted

## 2020-05-06 ENCOUNTER — Ambulatory Visit
Admission: RE | Admit: 2020-05-06 | Discharge: 2020-05-06 | Disposition: A | Discharge status: Home / Self Care | Payer: PPO | Source: Ambulatory Visit | Attending: Radiation Oncology | Admitting: Radiation Oncology

## 2020-05-06 DIAGNOSIS — C55 Malignant neoplasm of uterus, part unspecified: Secondary | ICD-10-CM | POA: Diagnosis not present

## 2020-05-06 NOTE — Telephone Encounter (Signed)
CALLED PATIENT TO REMIND OF HDR [facility] TX. FOR 05-11-20 @ 3 PM, SPOKE WITH PATIENT AND SHE IS AWARE OF THIS [place].

## 2020-05-06 NOTE — Progress Notes (Signed)
  Radiation Oncology         [phone] ________________________________  Name: [name] [name] MRN: [id number]  Date: 05/06/2020  DOB: 12/01/1945  CC: [name], MD  [name], MD  HDR BRACHYTHERAPY NOTE  DIAGNOSIS: Stage IA (pT1a, pN0) uterine carcinosarcoma   Simple treatment device note: Patient had construction of her custom vaginal cylinder. She will be treated with a 2.5 cm diameter segmented cylinder. This conforms to her anatomy without undue discomfort.  Vaginal brachytherapy procedure node: The patient was brought to the [place] suite. Identity was confirmed. All relevant records and images related to the planned course of therapy were reviewed. The patient freely provided informed written consent to proceed with treatment after reviewing the details related to the planned course of therapy. The consent form was witnessed and verified by the simulation staff. Then, the patient was set-up in a stable reproducible supine position for radiation therapy. Pelvic exam revealed the vaginal cuff to be intact . The patient's custom vaginal cylinder was placed in the proximal vagina. This was affixed to the CT/MR stabilization plate to prevent slippage. Patient tolerated the placement well.  Verification simulation note:  A fiducial marker was placed within the vaginal cylinder. An AP and lateral film was then obtained through the pelvis area. This documented accurate position of the vaginal cylinder for treatment.  HDR BRACHYTHERAPY TREATMENT  The remote afterloading device was affixed to the vaginal cylinder by catheter. Patient then proceeded to undergo her fourth high-dose-rate treatment directed at the proximal vagina. The patient was prescribed a dose of 6.0 gray to be delivered to the mucosal surface. Treatment length was 2.5 cm. Patient was treated with 1 channel using 6 dwell positions. Treatment time was 189.8 seconds. Iridium 192 was the high-dose-rate source for treatment.  The patient tolerated the treatment well. After completion of her therapy, a radiation survey was performed documenting return of the iridium source into the GammaMed safe.   PLAN: The patient will return next week for her fifth and final high-dose-rate treatment. ________________________________    Blair Promise, PhD, MD  This document serves as a record of services personally performed by Gery Pray, MD. It was created on his behalf by Clerance Lav, a trained medical scribe. The creation of this record is based on the scribe's personal observations and the provider's statements to them. This document has been checked and approved by the attending provider.

## 2020-05-10 NOTE — Progress Notes (Signed)
  Radiation Oncology         [phone] ________________________________  Name: [name] [name] MRN: [id number]  Date: 05/11/2020  DOB: 19-Apr-1946  CC: [name], MD  [name], MD  HDR BRACHYTHERAPY NOTE  DIAGNOSIS: Stage IA (pT1a, pN0) uterine carcinosarcoma   Simple treatment device note: Patient had construction of her custom vaginal cylinder. She will be treated with a 2.5 cm diameter segmented cylinder. This conforms to her anatomy without undue discomfort.  Vaginal brachytherapy procedure node: The patient was brought to the [place] suite. Identity was confirmed. All relevant records and images related to the planned course of therapy were reviewed. The patient freely provided informed written consent to proceed with treatment after reviewing the details related to the planned course of therapy. The consent form was witnessed and verified by the simulation staff. Then, the patient was set-up in a stable reproducible supine position for radiation therapy. Pelvic exam revealed the vaginal cuff to be intact . The patient's custom vaginal cylinder was placed in the proximal vagina. This was affixed to the CT/MR stabilization plate to prevent slippage. Patient tolerated the placement well.  Verification simulation note:  A fiducial marker was placed within the vaginal cylinder. An AP and lateral film was then obtained through the pelvis area. This documented accurate position of the vaginal cylinder for treatment.  HDR BRACHYTHERAPY TREATMENT  The remote afterloading device was affixed to the vaginal cylinder by catheter. Patient then proceeded to undergo her fifth high-dose-rate treatment directed at the proximal vagina. The patient was prescribed a dose of 6.0 gray to be delivered to the mucosal surface. Treatment length was 2.5 cm. Patient was treated with 1 channel using 6 dwell positions. Treatment time was 199.0 seconds. Iridium 192 was the high-dose-rate source for treatment.  The patient tolerated the treatment well. After completion of her therapy, a radiation survey was performed documenting return of the iridium source into the GammaMed safe.   PLAN: The patient will return in one month for routine follow-up. ________________________________    Blair Promise, PhD, MD  This document serves as a record of services personally performed by Gery Pray, MD. It was created on his behalf by Clerance Lav, a trained medical scribe. The creation of this record is based on the scribe's personal observations and the provider's statements to them. This document has been checked and approved by the attending provider.

## 2020-05-11 ENCOUNTER — Ambulatory Visit
Admission: RE | Admit: 2020-05-11 | Discharge: 2020-05-11 | Disposition: A | Discharge status: Home / Self Care | Payer: PPO | Source: Ambulatory Visit | Attending: Radiation Oncology | Admitting: Radiation Oncology

## 2020-05-11 ENCOUNTER — Encounter: Payer: Self-pay | Admitting: Radiation Oncology

## 2020-05-11 DIAGNOSIS — C55 Malignant neoplasm of uterus, part unspecified: Secondary | ICD-10-CM | POA: Diagnosis not present

## 2020-05-12 ENCOUNTER — Telehealth: Payer: Self-pay | Admitting: Oncology

## 2020-05-12 NOTE — Telephone Encounter (Signed)
[name] [name] called and said she is scheduled for a follow up with Dr. Sondra Come on 12/27 and she has chemotherapy that day and would like to reschedule.   Left a message with new appointment on 06/15/2020 at 11:30.

## 2020-05-13 ENCOUNTER — Ambulatory Visit: Payer: PPO | Admitting: Radiation Oncology

## 2020-05-14 ENCOUNTER — Inpatient Hospital Stay: Payer: PPO | Attending: Gynecologic Oncology

## 2020-05-14 ENCOUNTER — Other Ambulatory Visit: Payer: Self-pay | Admitting: Hematology and Oncology

## 2020-05-14 ENCOUNTER — Inpatient Hospital Stay: Payer: PPO

## 2020-05-14 ENCOUNTER — Inpatient Hospital Stay (HOSPITAL_BASED_OUTPATIENT_CLINIC_OR_DEPARTMENT_OTHER): Payer: PPO | Admitting: Hematology and Oncology

## 2020-05-14 ENCOUNTER — Encounter: Payer: Self-pay | Admitting: Hematology and Oncology

## 2020-05-14 ENCOUNTER — Other Ambulatory Visit: Payer: Self-pay

## 2020-05-14 VITALS — HR 98

## 2020-05-14 DIAGNOSIS — G620 Drug-induced polyneuropathy: Secondary | ICD-10-CM

## 2020-05-14 DIAGNOSIS — T451X5D Adverse effect of antineoplastic and immunosuppressive drugs, subsequent encounter: Secondary | ICD-10-CM | POA: Insufficient documentation

## 2020-05-14 DIAGNOSIS — Z5111 Encounter for antineoplastic chemotherapy: Secondary | ICD-10-CM | POA: Diagnosis not present

## 2020-05-14 DIAGNOSIS — C55 Malignant neoplasm of uterus, part unspecified: Secondary | ICD-10-CM

## 2020-05-14 DIAGNOSIS — R252 Cramp and spasm: Secondary | ICD-10-CM

## 2020-05-14 DIAGNOSIS — R739 Hyperglycemia, unspecified: Secondary | ICD-10-CM | POA: Diagnosis not present

## 2020-05-14 DIAGNOSIS — R634 Abnormal weight loss: Secondary | ICD-10-CM | POA: Diagnosis not present

## 2020-05-14 DIAGNOSIS — K5909 Other constipation: Secondary | ICD-10-CM | POA: Diagnosis not present

## 2020-05-14 DIAGNOSIS — T50905A Adverse effect of unspecified drugs, medicaments and biological substances, initial encounter: Secondary | ICD-10-CM | POA: Diagnosis not present

## 2020-05-14 DIAGNOSIS — E860 Dehydration: Secondary | ICD-10-CM | POA: Insufficient documentation

## 2020-05-14 DIAGNOSIS — G5600 Carpal tunnel syndrome, unspecified upper limb: Secondary | ICD-10-CM | POA: Insufficient documentation

## 2020-05-14 LAB — CMP (CANCER CENTER ONLY)
ALT: 12 U/L (ref 0–44)
AST: 17 U/L (ref 15–41)
Albumin: 3.8 g/dL (ref 3.5–5.0)
Alkaline Phosphatase: 66 U/L (ref 38–126)
Anion gap: 13 (ref 5–15)
BUN: 15 mg/dL (ref 8–23)
CO2: 25 mmol/L (ref 22–32)
Calcium: 9.5 mg/dL (ref 8.9–10.3)
Chloride: 104 mmol/L (ref 98–111)
Creatinine: 0.87 mg/dL (ref 0.44–1.00)
GFR, Estimated: >60 mL/min (ref >60)
Glucose, Bld: 195 mg/dL — ABNORMAL HIGH (ref 70–99)
Potassium: 3.7 mmol/L (ref 3.5–5.1)
Sodium: 142 mmol/L (ref 135–145)
Total Bilirubin: 0.4 mg/dL (ref 0.3–1.2)
Total Protein: 7 g/dL (ref 6.5–8.1)

## 2020-05-14 LAB — CBC WITH DIFFERENTIAL (CANCER CENTER ONLY)
Abs Immature Granulocytes: 0.04 10*3/uL (ref 0.00–0.07)
Basophils Absolute: 0 10*3/uL (ref 0.0–0.1)
Basophils Relative: 0 %
Eosinophils Absolute: 0 10*3/uL (ref 0.0–0.5)
Eosinophils Relative: 0 %
HCT: 31.4 % — ABNORMAL LOW (ref 36.0–46.0)
Hemoglobin: 10.8 g/dL — ABNORMAL LOW (ref 12.0–15.0)
Immature Granulocytes: 1 %
Lymphocytes Relative: 13 %
Lymphs Abs: 0.7 10*3/uL (ref 0.7–4.0)
MCH: 31.3 pg (ref 26.0–34.0)
MCHC: 34.4 g/dL (ref 30.0–36.0)
MCV: 91 fL (ref 80.0–100.0)
Monocytes Absolute: 0.2 10*3/uL (ref 0.1–1.0)
Monocytes Relative: 4 %
Neutro Abs: 4.4 10*3/uL (ref 1.7–7.7)
Neutrophils Relative %: 82 %
Platelet Count: 213 10*3/uL (ref 150–400)
RBC: 3.45 MIL/uL — ABNORMAL LOW (ref 3.87–5.11)
RDW: 16.7 % — ABNORMAL HIGH (ref 11.5–15.5)
WBC Count: 5.4 10*3/uL (ref 4.0–10.5)
nRBC: 0 % (ref 0.0–0.2)

## 2020-05-14 MED ORDER — SODIUM CHLORIDE 0.9 % IV SOLN
Freq: Once | INTRAVENOUS | Status: AC
  Filled 2020-05-14: qty 250

## 2020-05-14 MED ORDER — PALONOSETRON HCL INJECTION 0.25 MG/5ML
INTRAVENOUS | Status: AC
  Filled 2020-05-14: qty 5

## 2020-05-14 MED ORDER — FAMOTIDINE IN NACL 20-0.9 MG/50ML-% IV SOLN
INTRAVENOUS | Status: AC
  Filled 2020-05-14: qty 50

## 2020-05-14 MED ORDER — FAMOTIDINE IN NACL 20-0.9 MG/50ML-% IV SOLN
20.0000 mg | Freq: Once | INTRAVENOUS | Status: AC
  Administered 2020-05-14: 20 mg via INTRAVENOUS

## 2020-05-14 MED ORDER — PALONOSETRON HCL INJECTION 0.25 MG/5ML
0.2500 mg | Freq: Once | INTRAVENOUS | Status: AC
  Administered 2020-05-14: 0.25 mg via INTRAVENOUS

## 2020-05-14 MED ORDER — SODIUM CHLORIDE 0.9 % IV SOLN
10.0000 mg | Freq: Once | INTRAVENOUS | Status: AC
  Administered 2020-05-14: 10 mg via INTRAVENOUS
  Filled 2020-05-14: qty 10

## 2020-05-14 MED ORDER — SODIUM CHLORIDE 0.9% FLUSH
10.0000 mL | Freq: Once | INTRAVENOUS | Status: AC
  Administered 2020-05-14: 10 mL
  Filled 2020-05-14: qty 10

## 2020-05-14 MED ORDER — SODIUM CHLORIDE 0.9 % IV SOLN
508.2000 mg | Freq: Once | INTRAVENOUS | Status: AC
  Administered 2020-05-14: 510 mg via INTRAVENOUS
  Filled 2020-05-14: qty 51

## 2020-05-14 MED ORDER — HEPARIN SOD (PORK) LOCK FLUSH 100 UNIT/ML IV SOLN
500.0000 [IU] | Freq: Once | INTRAVENOUS | Status: DC | PRN
  Filled 2020-05-14: qty 5

## 2020-05-14 MED ORDER — LORAZEPAM 1 MG PO TABS: 1.0000 mg | ORAL_TABLET | Freq: Three times a day (TID) | ORAL | 1 refills | Status: DC | PRN

## 2020-05-14 MED ORDER — SODIUM CHLORIDE 0.9 % IV SOLN
140.0000 mg/m2 | Freq: Once | INTRAVENOUS | Status: AC
  Administered 2020-05-14: 264 mg via INTRAVENOUS
  Filled 2020-05-14: qty 44

## 2020-05-14 MED ORDER — SODIUM CHLORIDE 0.9 % IV SOLN
150.0000 mg | Freq: Once | INTRAVENOUS | Status: AC
  Administered 2020-05-14: 150 mg via INTRAVENOUS
  Filled 2020-05-14: qty 150

## 2020-05-14 MED ORDER — SODIUM CHLORIDE 0.9% FLUSH
10.0000 mL | INTRAVENOUS | Status: DC | PRN
  Filled 2020-05-14: qty 10

## 2020-05-14 NOTE — Assessment & Plan Note (Signed)
She continues to have profound leg cramps I recommend trial of lorazepam

## 2020-05-14 NOTE — Patient Instructions (Signed)
[facility] Discharge Instructions for Patients Receiving Chemotherapy  Today you received the following chemotherapy agents: Taxol, Carboplatin   To help prevent nausea and vomiting after your treatment, we encourage you to take your nausea medication as directed.    If you develop nausea and vomiting that is not controlled by your nausea medication, call the clinic.   BELOW ARE SYMPTOMS THAT SHOULD BE REPORTED IMMEDIATELY:  *FEVER GREATER THAN 100.5 F  *CHILLS WITH OR WITHOUT FEVER  NAUSEA AND VOMITING THAT IS NOT CONTROLLED WITH YOUR NAUSEA MEDICATION  *UNUSUAL SHORTNESS OF BREATH  *UNUSUAL BRUISING OR BLEEDING  TENDERNESS IN MOUTH AND THROAT WITH OR WITHOUT PRESENCE OF ULCERS  *URINARY PROBLEMS  *BOWEL PROBLEMS  UNUSUAL RASH Items with * indicate a potential emergency and should be followed up as soon as possible.  Feel free to call the clinic should you have any questions or concerns. The clinic phone number is [phone].  Please show the [place] at check-in to the Emergency Department and triage nurse.

## 2020-05-14 NOTE — Assessment & Plan Note (Signed)
She continues to experience multiple different side effects with cramps, neuropathy, hyperglycemia and others I plan to omit premed Benadryl I plan to institute additional lorazepam as muscle relaxant We will proceed with similar dose adjusted to her weight and kidney function  With recent omission of morning dexamethasone, her blood sugar is better She does not need insulin today

## 2020-05-14 NOTE — Assessment & Plan Note (Signed)
The peripheral neuropathy is not consistent with isolated chemotherapy-induced peripheral neuropathy She has carpal tunnel syndrome on the right hand I recommend ice treatment during chemotherapy

## 2020-05-14 NOTE — Progress Notes (Signed)
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[facility]) She continues to experience multiple different side effects with cramps, neuropathy, hyperglycemia and others I plan to omit premed Benadryl I plan to institute additional lorazepam as muscle relaxant We will proceed with similar dose adjusted to her weight and kidney function  With recent omission of morning dexamethasone, her blood sugar is better She does not need insulin today  Peripheral neuropathy due to chemotherapy [facility]) The peripheral neuropathy is not consistent with isolated chemotherapy-induced peripheral neuropathy She has carpal tunnel syndrome on the right hand I recommend ice treatment during chemotherapy  Leg cramps She continues to have profound leg cramps I recommend trial of lorazepam  Drug-induced hyperglycemia Her blood sugar is better She does not need insulin today She will continue aggressive dietary modification   No orders of the defined types were placed in this encounter.   All questions were answered. The patient knows to call the clinic with any problems, questions or concerns. The total time spent in the appointment was 20 minutes encounter with patients including review of chart and various tests results, discussions about plan of care and coordination of care plan   Heath Lark, MD 05/14/2020 11:26 AM  INTERVAL HISTORY: Please see below for problem oriented charting. She returns for chemotherapy and follow-up Her neuropathy is stable She is still bothered by severe leg cramps for 2 days after chemo No recent nausea or constipation  SUMMARY OF ONCOLOGIC HISTORY: Oncology History Overview Note  IHC MMR intact MSI-stable Carcinosarcoma    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[place])  01/01/2020 Imaging   CT A/P: 1. Severe distension of the endometrium highly suspicious for cervical obstruction or stenosis possibly due to a neoplastic process in this postmenopausal female with history of vaginal bleeding. Further evaluation with hysteroscopy is  recommended. 2. Diarrheal state. Correlation with clinical exam and stool cultures recommended. No bowel obstruction. Normal appendix.   01/03/2020 Surgery   D&C A. ENDOMETRIUM, CURETTAGE:  -  Poorly differentiated malignancy  -  See comment  COMMENT:  The material consists of a poorly differentiated malignancy with extensive necrosis.  By immunohistochemistry, p16 is positive and there is focal cytokeratin AE1/3 positivity but negative for cytokeratin 7, cytokeratin 5/6, CD20, CD3, p53, WT-1, Pax-8, and S100. The differential includes poorly differentiated carcinoma and high-grade stromal sarcoma.    02/04/2020 Surgery   Robotic-assisted laparoscopic total hysterectomy with bilateral salpingoophorectomy, SLN biopsy, left pelvic LND   On EUA, 10cm mobile uterus. ON intra-abdominal entry, normal upper abdominal survey. Normal appearing omentum, small and large bowel. Uterus 10cm and bulbous. Tortuous and hyperemic ovarian vasculature. Some tubal metaplasia vs. Metastatic disease. Normal appearing ovaries. Mapping successful to deep right obturator SLN and presacral SLN. NO mapping on left. Enlarge deep obturator lymph on the left. Given high grade histology, short small bowel mesentery, and recent COVID infection, decision made not to proceed with left PA LND (would have likely required open procedure. NO intra-abdominal or pelvic evidence of disease.    02/04/2020 Pathology Results   FINAL MICROSCOPIC DIAGNOSIS:   A. SENTINEL LYMPH NODE, RIGHT OBTURATOR, EXCISION:  -  No carcinoma identified in one lymph node (0/1)  -  See comment   B. UTERUS AND BILATERAL FALLOPIAN TUBES AND OVARIES, HYSTERECTOMY  AND  BILATERAL  SALPINGO-OOPHORECTOMY:  -  Carcinosarcoma confined to the uterus and involving less than half of the myometrium  -  See oncology table and comment below   C. LYMPH NODES, LEFT PELVIC, REGIONAL RESECTION:  -  No carcinoma identified in three lymph nodes (0/3)   D. SENTINEL LYMPH NODE, PRESACRAL, EXCISION:  -  No carcinoma identified in one lymph node (0/1)   COMMENT:   A.  Cytokeratin AE1/3 was performed on the sentinel lymph nodes to exclude micrometastasis.  There is no evidence of metastatic carcinoma by immunohistochemistry.   B.  The tumor consists of poorly cohesive epithelioid and rhabdoid cells.  A panel of immunohistochemistry (p16, f, p53, PAX 8, SMA, CD10, CD45, CD 99, CD117, CD138, cytokeratin 7, cyclin D1, cytokeratin AE1/3, EMA and GATA3) is performed to better classify this neoplasm.  By immunohistochemistry, the neoplastic cells are positive for p16, p53 and  PAX 8.  The epithelioid cells are positive for cytokeratin AE1/3 and EMA while the rhabdoid cells show positivity for desmin. Overall, the immunophenotype and morphology supports the diagnosis of carcinosarcoma.    ONCOLOGY TABLE:   UTERUS, CARCINOMA OR CARCINOSARCOMA   Procedure: Total hysterectomy and bilateral salpingo-oophorectomy  Histologic type: Carcinosarcoma  Histologic Grade: FIGO grade 3  Myometrial invasion: Estimated to be less than 50%  Uterine Serosa Involvement: Not identified  Cervical stromal involvement: Not identified  Extent of involvement of other organs: Not identified  Lymphovascular invasion: Not identified  Regional Lymph Nodes:       Examined:       2 Sentinel                               3 non-sentinel                               5 total        Lymph nodes with metastasis: 0        Isolated tumor cells (<0.2 mm): 0        Micrometastasis:  (>0.2 mm and < 2.0 mm): 0        Macrometastasis: (>2.0 mm): 0        Extracapsular extension: N/A  Representative Tumor  Block: B1  MMR / MSI testing: Will be ordered  Pathologic Stage Classification (pTNM, AJCC 8th edition):  pT1a, pN0  Comments: See above    02/04/2020 Cancer Staging   Staging form: Corpus Uteri - Carcinoma and Carcinosarcoma, AJCC 8th Edition - Clinical stage from 02/04/2020: FIGO Stage IA (cT1a, cN0(sn), cM0) - Signed by Lafonda Mosses, MD on 02/12/2020   03/06/2020 Imaging   Placement of single lumen port a cath via right internal jugular vein. The catheter tip lies at the cavo-atrial junction. A power injectable port a cath was placed and is ready for immediate use.   03/12/2020 -  Chemotherapy   The patient had carboplatin and taxol for chemotherapy treatment.       REVIEW OF SYSTEMS:   Constitutional: Denies fevers, chills or abnormal weight loss Eyes: Denies blurriness of vision Ears, nose, mouth, throat, and face: Denies mucositis or sore throat Respiratory: Denies cough, dyspnea or wheezes Cardiovascular: Denies palpitation, chest discomfort or lower extremity swelling Gastrointestinal:  Denies nausea, heartburn or change in bowel habits Skin: Denies abnormal skin rashes Lymphatics: Denies new lymphadenopathy or easy bruising Behavioral/Psych: Mood is stable, no new changes  All  other systems were reviewed with the patient and are negative.  I have reviewed the past medical history, past surgical history, social history and family history with the patient and they are unchanged from previous note.  ALLERGIES:  has No Known Allergies.  MEDICATIONS:  Current Outpatient Medications  Medication Sig Dispense Refill  . balsalazide (COLAZAL) 750 MG capsule Take 2 capsules (1,500 mg total) by mouth 2 (two) times daily. 360 capsule 1  . calcium carbonate (CALCIUM 600) 600 MG TABS tablet Take 600 mg by mouth daily with breakfast.    . carvedilol (COREG) 3.125 MG tablet Take 1 tablet (3.125 mg total) by mouth 2 (two) times daily. 180 tablet 3  . Cholecalciferol (VITAMIN D) 50 MCG  (2000 UT) tablet Take 2,000 Units by mouth daily.     . clopidogrel (PLAVIX) 75 MG tablet Take 1 tablet (75 mg total) by mouth daily. 90 tablet 3  . dexamethasone (DECADRON) 4 MG tablet Take 2 tabs at the night before chemotherapy, every 3 weeks, by mouth x 6 cycles 36 tablet 6  . docusate sodium (COLACE) 100 MG capsule Take 100 mg by mouth daily.     Marland Kitchen FLUoxetine (PROZAC) 10 MG capsule Take 10 mg by mouth daily.     Marland Kitchen lidocaine-prilocaine (EMLA) cream Apply to affected area once 30 g 3  . LORazepam (ATIVAN) 1 MG tablet Take 1 tablet (1 mg total) by mouth every 8 (eight) hours as needed for anxiety (for anxiety). 60 tablet 1  . losartan-hydrochlorothiazide (HYZAAR) 100-12.5 MG tablet Take 1 tablet by mouth daily.    . magnesium oxide (MAG-OX) 400 (241.3 Mg) MG tablet Take 1 tablet (400 mg total) by mouth daily. 30 tablet 11  . metFORMIN (GLUCOPHAGE-XR) 500 MG 24 hr tablet Take 500 mg by mouth daily with supper.   5  . Multiple Vitamins-Minerals (MULTIVITAMIN WITH MINERALS) tablet Take 1 tablet by mouth daily.      . nitroGLYCERIN (NITROSTAT) 0.4 MG SL tablet Place 1 tablet (0.4 mg total) under the tongue every 5 (five) minutes as needed for chest pain. 25 tablet 3  . ondansetron (ZOFRAN) 8 MG tablet Take 1 tablet (8 mg total) by mouth every 8 (eight) hours as needed. 30 tablet 1  . pantoprazole (PROTONIX) 40 MG tablet Take 1 tablet (40 mg total) by mouth daily. Must keep upcoming appt in Nov for future refills 90 tablet 0  . prochlorperazine (COMPAZINE) 10 MG tablet Take 1 tablet (10 mg total) by mouth every 6 (six) hours as needed (Nausea or vomiting). 30 tablet 1  . simvastatin (ZOCOR) 40 MG tablet Take 1 tablet (40 mg total) by mouth every evening. 90 tablet 3   No current facility-administered medications for this visit.   Facility-Administered Medications Ordered in Other Visits  Medication Dose Route Frequency Provider Last Rate Last Admin  . CARBOplatin (PARAPLATIN) 510 mg in sodium  chloride 0.9 % 250 mL chemo infusion  510 mg Intravenous Once Alvy Bimler, Rorey Hodges, MD      . dexamethasone (DECADRON) 10 mg in sodium chloride 0.9 % 50 mL IVPB  10 mg Intravenous Once Alvy Bimler, Oliveah Zwack, MD      . famotidine (PEPCID) IVPB 20 mg premix  20 mg Intravenous Once Alvy Bimler, Delara Shepheard, MD 200 mL/hr at 05/14/20 1117 20 mg at 05/14/20 1117  . fosaprepitant (EMEND) 150 mg in sodium chloride 0.9 % 145 mL IVPB  150 mg Intravenous Once Woodie Degraffenreid, MD      . heparin lock flush 100 unit/mL  500 Units Intracatheter Once PRN Alvy Bimler, Kimyata Milich, MD      . PACLitaxel (TAXOL) 264 mg in sodium chloride 0.9 % 250 mL chemo infusion (> 70m/m2)  140 mg/m2 (Treatment Plan Recorded) Intravenous Once Illa Enlow, MD      . sodium chloride flush (NS) 0.9 % injection 10 mL  10 mL Intracatheter PRN GAlvy Bimler Orrie Schubert, MD        PHYSICAL EXAMINATION: ECOG PERFORMANCE STATUS: 1 - Symptomatic but completely ambulatory  Vitals:   05/14/20 1028  BP: 133/73  Pulse: (!) 109  Resp: 18  Temp: 97.8 F (36.6 C)  SpO2: 97%   Filed Weights   05/14/20 1028  Weight: 166 lb 6.4 oz (75.5 kg)    GENERAL:alert, no distress and comfortable SKIN: skin color, texture, turgor are normal, no rashes or significant lesions EYES: normal, Conjunctiva are pink and non-injected, sclera clear OROPHARYNX:no exudate, no erythema and lips, buccal mucosa, and tongue normal  NECK: supple, thyroid normal size, non-tender, without nodularity LYMPH:  no palpable lymphadenopathy in the cervical, axillary or inguinal LUNGS: clear to auscultation and percussion with normal breathing effort HEART: regular rate & rhythm and no murmurs and no lower extremity edema ABDOMEN:abdomen soft, non-tender and normal bowel sounds Musculoskeletal:no cyanosis of digits and no clubbing  NEURO: alert & oriented x 3 with fluent speech, no focal motor/sensory deficits  LABORATORY DATA:  I have reviewed the data as listed    Component Value Date/Time   NA 142 05/14/2020 1007   K 3.7  05/14/2020 1007   CL 104 05/14/2020 1007   CO2 25 05/14/2020 1007   GLUCOSE 195 (H) 05/14/2020 1007   BUN 15 05/14/2020 1007   CREATININE 0.87 05/14/2020 1007   CREATININE 0.84 03/24/2015 0938   CALCIUM 9.5 05/14/2020 1007   PROT 7.0 05/14/2020 1007   PROT 6.5 08/17/2017 0859   ALBUMIN 3.8 05/14/2020 1007   ALBUMIN 4.3 08/17/2017 0859   AST 17 05/14/2020 1007   ALT 12 05/14/2020 1007   ALKPHOS 66 05/14/2020 1007   BILITOT 0.4 05/14/2020 1007   GFRNONAA >60 05/14/2020 1007   GFRAA >60 03/11/2020 1509    No results found for: SPEP, UPEP  Lab Results  Component Value Date   WBC 5.4 05/14/2020   NEUTROABS 4.4 05/14/2020   HGB 10.8 (L) 05/14/2020   HCT 31.4 (L) 05/14/2020   MCV 91.0 05/14/2020   PLT 213 05/14/2020      Chemistry      Component Value Date/Time   NA 142 05/14/2020 1007   K 3.7 05/14/2020 1007   CL 104 05/14/2020 1007   CO2 25 05/14/2020 1007   BUN 15 05/14/2020 1007   CREATININE 0.87 05/14/2020 1007   CREATININE 0.84 03/24/2015 0938      Component Value Date/Time   CALCIUM 9.5 05/14/2020 1007   ALKPHOS 66 05/14/2020 1007   AST 17 05/14/2020 1007   ALT 12 05/14/2020 1007   BILITOT 0.4 05/14/2020 1007

## 2020-05-14 NOTE — Assessment & Plan Note (Signed)
Her blood sugar is better She does not need insulin today She will continue aggressive dietary modification

## 2020-05-14 NOTE — Progress Notes (Signed)
Pt discharged in no apparent distress. Pt left ambulatory without assistance. Pt aware of discharge instructions and verbalized understanding and had no further questions.  

## 2020-05-15 ENCOUNTER — Telehealth: Payer: Self-pay

## 2020-05-15 NOTE — Telephone Encounter (Signed)
Called and clarify Magnesium instructions, take BID. She verbalized understanding.

## 2020-06-04 ENCOUNTER — Ambulatory Visit: Payer: PPO | Admitting: Hematology and Oncology

## 2020-06-04 ENCOUNTER — Other Ambulatory Visit: Payer: PPO

## 2020-06-04 ENCOUNTER — Ambulatory Visit: Payer: PPO

## 2020-06-08 ENCOUNTER — Other Ambulatory Visit: Payer: Self-pay

## 2020-06-08 ENCOUNTER — Encounter: Payer: Self-pay | Admitting: Hematology and Oncology

## 2020-06-08 ENCOUNTER — Inpatient Hospital Stay: Payer: PPO | Admitting: Hematology and Oncology

## 2020-06-08 ENCOUNTER — Inpatient Hospital Stay: Payer: PPO

## 2020-06-08 ENCOUNTER — Ambulatory Visit: Payer: Self-pay | Admitting: Radiation Oncology

## 2020-06-08 DIAGNOSIS — G620 Drug-induced polyneuropathy: Secondary | ICD-10-CM

## 2020-06-08 DIAGNOSIS — C55 Malignant neoplasm of uterus, part unspecified: Secondary | ICD-10-CM

## 2020-06-08 DIAGNOSIS — T451X5A Adverse effect of antineoplastic and immunosuppressive drugs, initial encounter: Secondary | ICD-10-CM | POA: Diagnosis not present

## 2020-06-08 DIAGNOSIS — T50905A Adverse effect of unspecified drugs, medicaments and biological substances, initial encounter: Secondary | ICD-10-CM | POA: Diagnosis not present

## 2020-06-08 DIAGNOSIS — D6481 Anemia due to antineoplastic chemotherapy: Secondary | ICD-10-CM | POA: Insufficient documentation

## 2020-06-08 DIAGNOSIS — R739 Hyperglycemia, unspecified: Secondary | ICD-10-CM

## 2020-06-08 DIAGNOSIS — Z5111 Encounter for antineoplastic chemotherapy: Secondary | ICD-10-CM | POA: Diagnosis not present

## 2020-06-08 LAB — CBC WITH DIFFERENTIAL (CANCER CENTER ONLY)
Abs Immature Granulocytes: 0.04 10*3/uL (ref 0.00–0.07)
Basophils Absolute: 0 10*3/uL (ref 0.0–0.1)
Basophils Relative: 0 %
Eosinophils Absolute: 0 10*3/uL (ref 0.0–0.5)
Eosinophils Relative: 0 %
HCT: 28.9 % — ABNORMAL LOW (ref 36.0–46.0)
Hemoglobin: 10 g/dL — ABNORMAL LOW (ref 12.0–15.0)
Immature Granulocytes: 1 %
Lymphocytes Relative: 9 %
Lymphs Abs: 0.6 10*3/uL — ABNORMAL LOW (ref 0.7–4.0)
MCH: 32.7 pg (ref 26.0–34.0)
MCHC: 34.6 g/dL (ref 30.0–36.0)
MCV: 94.4 fL (ref 80.0–100.0)
Monocytes Absolute: 0.2 10*3/uL (ref 0.1–1.0)
Monocytes Relative: 4 %
Neutro Abs: 5.6 10*3/uL (ref 1.7–7.7)
Neutrophils Relative %: 86 %
Platelet Count: 209 10*3/uL (ref 150–400)
RBC: 3.06 MIL/uL — ABNORMAL LOW (ref 3.87–5.11)
RDW: 18.4 % — ABNORMAL HIGH (ref 11.5–15.5)
WBC Count: 6.4 10*3/uL (ref 4.0–10.5)
nRBC: 0 % (ref 0.0–0.2)

## 2020-06-08 LAB — CMP (CANCER CENTER ONLY)
ALT: 10 U/L (ref 0–44)
AST: 15 U/L (ref 15–41)
Albumin: 3.8 g/dL (ref 3.5–5.0)
Alkaline Phosphatase: 60 U/L (ref 38–126)
Anion gap: 14 (ref 5–15)
BUN: 13 mg/dL (ref 8–23)
CO2: 24 mmol/L (ref 22–32)
Calcium: 9.2 mg/dL (ref 8.9–10.3)
Chloride: 101 mmol/L (ref 98–111)
Creatinine: 1.01 mg/dL — ABNORMAL HIGH (ref 0.44–1.00)
GFR, Estimated: 59 mL/min — ABNORMAL LOW (ref >60)
Glucose, Bld: 238 mg/dL — ABNORMAL HIGH (ref 70–99)
Potassium: 3.5 mmol/L (ref 3.5–5.1)
Sodium: 139 mmol/L (ref 135–145)
Total Bilirubin: 0.8 mg/dL (ref 0.3–1.2)
Total Protein: 6.9 g/dL (ref 6.5–8.1)

## 2020-06-08 MED ORDER — PALONOSETRON HCL INJECTION 0.25 MG/5ML
0.2500 mg | Freq: Once | INTRAVENOUS | Status: AC
  Administered 2020-06-08: 0.25 mg via INTRAVENOUS

## 2020-06-08 MED ORDER — SODIUM CHLORIDE 0.9 % IV SOLN
510.0000 mg | Freq: Once | INTRAVENOUS | Status: AC
  Administered 2020-06-08: 510 mg via INTRAVENOUS
  Filled 2020-06-08: qty 51

## 2020-06-08 MED ORDER — FAMOTIDINE IN NACL 20-0.9 MG/50ML-% IV SOLN
INTRAVENOUS | Status: AC
  Filled 2020-06-08: qty 50

## 2020-06-08 MED ORDER — SODIUM CHLORIDE 0.9 % IV SOLN
Freq: Once | INTRAVENOUS | Status: AC
  Filled 2020-06-08: qty 250

## 2020-06-08 MED ORDER — SODIUM CHLORIDE 0.9 % IV SOLN
264.0000 mg | Freq: Once | INTRAVENOUS | Status: AC
  Administered 2020-06-08: 264 mg via INTRAVENOUS
  Filled 2020-06-08: qty 44

## 2020-06-08 MED ORDER — SODIUM CHLORIDE 0.9% FLUSH
10.0000 mL | INTRAVENOUS | Status: DC | PRN
  Administered 2020-06-08: 10 mL
  Filled 2020-06-08: qty 10

## 2020-06-08 MED ORDER — HEPARIN SOD (PORK) LOCK FLUSH 100 UNIT/ML IV SOLN
500.0000 [IU] | Freq: Once | INTRAVENOUS | Status: AC | PRN
  Administered 2020-06-08: 500 [IU]
  Filled 2020-06-08: qty 5

## 2020-06-08 MED ORDER — SODIUM CHLORIDE 0.9 % IV SOLN
10.0000 mg | Freq: Once | INTRAVENOUS | Status: AC
  Administered 2020-06-08: 10 mg via INTRAVENOUS
  Filled 2020-06-08: qty 10

## 2020-06-08 MED ORDER — OXYCODONE HCL 5 MG PO TABS: 5.0000 mg | ORAL_TABLET | ORAL | 0 refills | Status: DC | PRN

## 2020-06-08 MED ORDER — FAMOTIDINE IN NACL 20-0.9 MG/50ML-% IV SOLN
20.0000 mg | Freq: Once | INTRAVENOUS | Status: AC
  Administered 2020-06-08: 20 mg via INTRAVENOUS

## 2020-06-08 MED ORDER — SODIUM CHLORIDE 0.9% FLUSH
10.0000 mL | Freq: Once | INTRAVENOUS | Status: AC
  Administered 2020-06-08: 10 mL
  Filled 2020-06-08: qty 10

## 2020-06-08 MED ORDER — SODIUM CHLORIDE 0.9 % IV SOLN
150.0000 mg | Freq: Once | INTRAVENOUS | Status: AC
  Administered 2020-06-08: 150 mg via INTRAVENOUS
  Filled 2020-06-08: qty 150

## 2020-06-08 MED ORDER — PALONOSETRON HCL INJECTION 0.25 MG/5ML
INTRAVENOUS | Status: AC
  Filled 2020-06-08: qty 5

## 2020-06-08 NOTE — Assessment & Plan Note (Signed)
This is likely due to recent treatment. The patient denies recent history of bleeding such as epistaxis, hematuria or hematochezia. She is asymptomatic from the anemia. I will observe for now.  She does not require transfusion now. I will continue the chemotherapy at current dose without dosage adjustment.  If the anemia gets progressive worse in the future, I might have to delay her treatment or adjust the chemotherapy dose.

## 2020-06-08 NOTE — Assessment & Plan Note (Signed)
She continues to experience multiple different side effects with cramps, neuropathy, hyperglycemia and others I plan to omit premed Benadryl I plan to institute additional lorazepam as muscle relaxant We will proceed with similar dose adjusted to her weight and kidney function  With recent omission of morning dexamethasone, her blood sugar is better She does not need insulin today

## 2020-06-08 NOTE — Progress Notes (Signed)
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[facility]) She continues to experience multiple different side effects with cramps, neuropathy, hyperglycemia and others I plan to omit premed Benadryl I plan to institute additional lorazepam as muscle relaxant We will proceed with similar dose adjusted to her weight and kidney function  With recent omission of morning dexamethasone, her blood sugar is better She does not need insulin today  Drug-induced hyperglycemia Her blood sugar is better She does not need insulin today She will continue aggressive dietary modification  Anemia due to antineoplastic chemotherapy This is likely due to recent treatment. The patient denies recent history of bleeding such as epistaxis, hematuria or hematochezia. She is asymptomatic from the anemia. I will observe for now.  She does not require transfusion now. I will continue the chemotherapy at current dose without dosage adjustment.  If the anemia gets progressive worse in the future, I might have to delay her treatment or adjust the chemotherapy dose.   Peripheral neuropathy due to chemotherapy [facility]) The peripheral neuropathy is not consistent with isolated chemotherapy-induced peripheral neuropathy I recommend ice treatment during chemotherapy Observe closely and continue same dose as before   No orders of the defined types were placed in this encounter.   All questions were answered. The patient knows to call the clinic with any problems, questions or concerns. The total time spent in the appointment was 20 minutes encounter with patients including review of chart and various tests results, discussions about plan of care and coordination of care plan   Heath Lark,  MD 06/08/2020 10:50 AM  INTERVAL HISTORY: Please see below for problem oriented charting. She returns for further follow-up She continues to have severe cramps with last cycle of treatment She takes occasional pain medicine as needed Neuropathy stable Her blood sugar was intermittently elevated She denies recent constipation  SUMMARY OF ONCOLOGIC HISTORY: Oncology History Overview Note  IHC MMR intact MSI-stable Carcinosarcoma   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[place])  01/01/2020 Imaging   CT A/P: 1. Severe distension of the endometrium highly suspicious for cervical obstruction or stenosis possibly due to a neoplastic process in this postmenopausal female with history of vaginal bleeding. Further evaluation with hysteroscopy is  recommended. 2. Diarrheal state. Correlation with clinical exam and stool cultures recommended. No bowel obstruction. Normal appendix.   01/03/2020 Surgery   D&C A. ENDOMETRIUM, CURETTAGE:  -  Poorly differentiated malignancy  -  See comment  COMMENT:  The material consists of a poorly differentiated malignancy with extensive necrosis.  By immunohistochemistry, p16 is positive and there is focal cytokeratin AE1/3 positivity but negative for cytokeratin 7, cytokeratin 5/6, CD20, CD3, p53, WT-1, Pax-8, and S100. The differential includes poorly differentiated carcinoma and high-grade stromal sarcoma.    02/04/2020 Surgery   Robotic-assisted laparoscopic total hysterectomy with bilateral salpingoophorectomy, SLN biopsy, left pelvic LND   On EUA, 10cm mobile uterus. ON intra-abdominal entry, normal upper abdominal survey. Normal appearing omentum, small and large bowel. Uterus 10cm and bulbous. Tortuous and hyperemic ovarian vasculature. Some tubal metaplasia vs. Metastatic disease. Normal appearing ovaries. Mapping successful to deep right obturator SLN and presacral SLN. NO mapping on left. Enlarge deep obturator lymph on the left. Given high grade histology, short  small bowel mesentery, and recent COVID infection, decision made not  to proceed with left PA LND (would have likely required open procedure. NO intra-abdominal or pelvic evidence of disease.    02/04/2020 Pathology Results   FINAL MICROSCOPIC DIAGNOSIS:   A. SENTINEL LYMPH NODE, RIGHT OBTURATOR, EXCISION:  -  No carcinoma identified in one lymph node (0/1)  -  See comment   B. UTERUS AND BILATERAL FALLOPIAN TUBES AND OVARIES, HYSTERECTOMY AND  BILATERAL SALPINGO-OOPHORECTOMY:  -  Carcinosarcoma confined to the uterus and involving less than half of the myometrium  -  See oncology table and comment below   C. LYMPH NODES, LEFT PELVIC, REGIONAL RESECTION:  -  No carcinoma identified in three lymph nodes (0/3)   D. SENTINEL LYMPH NODE, PRESACRAL, EXCISION:  -  No carcinoma identified in one lymph node (0/1)   COMMENT:   A.  Cytokeratin AE1/3 was performed on the sentinel lymph nodes to exclude micrometastasis.  There is no evidence of metastatic carcinoma by immunohistochemistry.   B.  The tumor consists of poorly cohesive epithelioid and rhabdoid cells.  A panel of immunohistochemistry (p16, f, p53, PAX 8, SMA, CD10, CD45, CD 99, CD117, CD138, cytokeratin 7, cyclin D1, cytokeratin AE1/3, EMA and GATA3) is performed to better classify this neoplasm.  By immunohistochemistry, the neoplastic cells are positive for p16, p53 and  PAX 8.  The epithelioid cells are positive for cytokeratin AE1/3 and EMA while the rhabdoid cells show positivity for desmin. Overall, the immunophenotype and morphology supports the diagnosis of carcinosarcoma.    ONCOLOGY TABLE:   UTERUS, CARCINOMA OR CARCINOSARCOMA   Procedure: Total hysterectomy and bilateral salpingo-oophorectomy  Histologic type: Carcinosarcoma  Histologic Grade: FIGO grade 3  Myometrial invasion: Estimated to be less than 50%  Uterine Serosa Involvement: Not identified  Cervical stromal involvement: Not identified  Extent of involvement  of other organs: Not identified  Lymphovascular invasion: Not identified  Regional Lymph Nodes:       Examined:       2 Sentinel                               3 non-sentinel                               5 total        Lymph nodes with metastasis: 0        Isolated tumor cells (<0.2 mm): 0        Micrometastasis:  (>0.2 mm and < 2.0 mm): 0        Macrometastasis: (>2.0 mm): 0        Extracapsular extension: N/A  Representative Tumor Block: B1  MMR / MSI testing: Will be ordered  Pathologic Stage Classification (pTNM, AJCC 8th edition):  pT1a, pN0  Comments: See above    02/04/2020 Cancer Staging   Staging form: Corpus Uteri - Carcinoma and Carcinosarcoma, AJCC 8th Edition - Clinical stage from 02/04/2020: FIGO Stage IA (cT1a, cN0(sn), cM0) - Signed by Lafonda Mosses, MD on 02/12/2020   03/06/2020 Imaging   Placement of single lumen port a cath via right internal jugular vein. The catheter tip lies at the cavo-atrial junction. A power injectable port a cath was placed and is ready for immediate use.   03/12/2020 -  Chemotherapy   The patient had carboplatin and taxol for chemotherapy treatment.       REVIEW OF SYSTEMS:   Constitutional:  Denies fevers, chills or abnormal weight loss Eyes: Denies blurriness of vision Ears, nose, mouth, throat, and face: Denies mucositis or sore throat Respiratory: Denies cough, dyspnea or wheezes Cardiovascular: Denies palpitation, chest discomfort or lower extremity swelling Gastrointestinal:  Denies nausea, heartburn or change in bowel habits Skin: Denies abnormal skin rashes Lymphatics: Denies new lymphadenopathy or easy bruising Behavioral/Psych: Mood is stable, no new changes  All other systems were reviewed with the patient and are negative.  I have reviewed the past medical history, past surgical history, social history and family history with the patient and they are unchanged from previous note.  ALLERGIES:  has No Known  Allergies.  MEDICATIONS:  Current Outpatient Medications  Medication Sig Dispense Refill  . oxyCODONE (OXY IR/ROXICODONE) 5 MG immediate release tablet Take 1 tablet (5 mg total) by mouth every 4 (four) hours as needed for severe pain. 30 tablet 0  . balsalazide (COLAZAL) 750 MG capsule Take 2 capsules (1,500 mg total) by mouth 2 (two) times daily. 360 capsule 1  . calcium carbonate (CALCIUM 600) 600 MG TABS tablet Take 600 mg by mouth daily with breakfast.    . carvedilol (COREG) 3.125 MG tablet Take 1 tablet (3.125 mg total) by mouth 2 (two) times daily. 180 tablet 3  . Cholecalciferol (VITAMIN D) 50 MCG (2000 UT) tablet Take 2,000 Units by mouth daily.     . clopidogrel (PLAVIX) 75 MG tablet Take 1 tablet (75 mg total) by mouth daily. 90 tablet 3  . dexamethasone (DECADRON) 4 MG tablet Take 2 tabs at the night before chemotherapy, every 3 weeks, by mouth x 6 cycles 36 tablet 6  . docusate sodium (COLACE) 100 MG capsule Take 100 mg by mouth daily.     Marland Kitchen FLUoxetine (PROZAC) 10 MG capsule Take 10 mg by mouth daily.     Marland Kitchen lidocaine-prilocaine (EMLA) cream Apply to affected area once 30 g 3  . LORazepam (ATIVAN) 1 MG tablet Take 1 tablet (1 mg total) by mouth every 8 (eight) hours as needed for anxiety (for anxiety). 60 tablet 1  . losartan-hydrochlorothiazide (HYZAAR) 100-12.5 MG tablet Take 1 tablet by mouth daily.    . magnesium oxide (MAG-OX) 400 (241.3 Mg) MG tablet Take 1 tablet (400 mg total) by mouth daily. (Patient taking differently: Take 400 mg by mouth 2 (two) times daily. ) 30 tablet 11  . metFORMIN (GLUCOPHAGE-XR) 500 MG 24 hr tablet Take 500 mg by mouth daily with supper.   5  . Multiple Vitamins-Minerals (MULTIVITAMIN WITH MINERALS) tablet Take 1 tablet by mouth daily.      . nitroGLYCERIN (NITROSTAT) 0.4 MG SL tablet Place 1 tablet (0.4 mg total) under the tongue every 5 (five) minutes as needed for chest pain. 25 tablet 3  . ondansetron (ZOFRAN) 8 MG tablet Take 1 tablet (8 mg  total) by mouth every 8 (eight) hours as needed. 30 tablet 1  . pantoprazole (PROTONIX) 40 MG tablet Take 1 tablet (40 mg total) by mouth daily. Must keep upcoming appt in Nov for future refills 90 tablet 0  . prochlorperazine (COMPAZINE) 10 MG tablet Take 1 tablet (10 mg total) by mouth every 6 (six) hours as needed (Nausea or vomiting). 30 tablet 1  . simvastatin (ZOCOR) 40 MG tablet Take 1 tablet (40 mg total) by mouth every evening. 90 tablet 3   No current facility-administered medications for this visit.    PHYSICAL EXAMINATION: ECOG PERFORMANCE STATUS: 1 - Symptomatic but completely ambulatory  Vitals:  06/08/20 1007  BP: 137/69  Pulse: 96  Resp: 18  Temp: 97.7 F (36.5 C)  SpO2: 98%   Filed Weights   06/08/20 1007  Weight: 164 lb (74.4 kg)    GENERAL:alert, no distress and comfortable Musculoskeletal:no cyanosis of digits and no clubbing  NEURO: alert & oriented x 3 with fluent speech, no focal motor/sensory deficits  LABORATORY DATA:  I have reviewed the data as listed    Component Value Date/Time   NA 139 06/08/2020 0955   K 3.5 06/08/2020 0955   CL 101 06/08/2020 0955   CO2 24 06/08/2020 0955   GLUCOSE 238 (H) 06/08/2020 0955   BUN 13 06/08/2020 0955   CREATININE 1.01 (H) 06/08/2020 0955   CREATININE 0.84 03/24/2015 0938   CALCIUM 9.2 06/08/2020 0955   PROT 6.9 06/08/2020 0955   PROT 6.5 08/17/2017 0859   ALBUMIN 3.8 06/08/2020 0955   ALBUMIN 4.3 08/17/2017 0859   AST 15 06/08/2020 0955   ALT 10 06/08/2020 0955   ALKPHOS 60 06/08/2020 0955   BILITOT 0.8 06/08/2020 0955   GFRNONAA 59 (L) 06/08/2020 0955   GFRAA >60 03/11/2020 1509    No results found for: SPEP, UPEP  Lab Results  Component Value Date   WBC 6.4 06/08/2020   NEUTROABS 5.6 06/08/2020   HGB 10.0 (L) 06/08/2020   HCT 28.9 (L) 06/08/2020   MCV 94.4 06/08/2020   PLT 209 06/08/2020      Chemistry      Component Value Date/Time   NA 139 06/08/2020 0955   K 3.5 06/08/2020 0955    CL 101 06/08/2020 0955   CO2 24 06/08/2020 0955   BUN 13 06/08/2020 0955   CREATININE 1.01 (H) 06/08/2020 0955   CREATININE 0.84 03/24/2015 0938      Component Value Date/Time   CALCIUM 9.2 06/08/2020 0955   ALKPHOS 60 06/08/2020 0955   AST 15 06/08/2020 0955   ALT 10 06/08/2020 0955   BILITOT 0.8 06/08/2020 0955

## 2020-06-08 NOTE — Assessment & Plan Note (Signed)
The peripheral neuropathy is not consistent with isolated chemotherapy-induced peripheral neuropathy I recommend ice treatment during chemotherapy Observe closely and continue same dose as before

## 2020-06-08 NOTE — Patient Instructions (Signed)
[facility] Discharge Instructions for Patients Receiving Chemotherapy  Today you received the following chemotherapy agents carboplatin, paclitaxel.  To help prevent nausea and vomiting after your treatment, we encourage you to take your nausea medication as directed.    If you develop nausea and vomiting that is not controlled by your nausea medication, call the clinic.   BELOW ARE SYMPTOMS THAT SHOULD BE REPORTED IMMEDIATELY:  *FEVER GREATER THAN 100.5 F  *CHILLS WITH OR WITHOUT FEVER  NAUSEA AND VOMITING THAT IS NOT CONTROLLED WITH YOUR NAUSEA MEDICATION  *UNUSUAL SHORTNESS OF BREATH  *UNUSUAL BRUISING OR BLEEDING  TENDERNESS IN MOUTH AND THROAT WITH OR WITHOUT PRESENCE OF ULCERS  *URINARY PROBLEMS  *BOWEL PROBLEMS  UNUSUAL RASH Items with * indicate a potential emergency and should be followed up as soon as possible.  Feel free to call the clinic should you have any questions or concerns. The clinic phone number is [phone].  Please show the [place] at check-in to the Emergency Department and triage nurse.

## 2020-06-08 NOTE — Assessment & Plan Note (Signed)
Her blood sugar is better She does not need insulin today She will continue aggressive dietary modification

## 2020-06-15 ENCOUNTER — Ambulatory Visit
Admission: RE | Admit: 2020-06-15 | Discharge: 2020-06-15 | Disposition: A | Discharge status: Home / Self Care | Payer: PPO | Source: Ambulatory Visit | Attending: Radiation Oncology | Admitting: Radiation Oncology

## 2020-06-15 ENCOUNTER — Telehealth: Payer: Self-pay | Admitting: *Deleted

## 2020-06-15 ENCOUNTER — Telehealth: Payer: Self-pay | Admitting: Oncology

## 2020-06-15 NOTE — Telephone Encounter (Signed)
[name] [name] left a message asking to cancel her follow up today with Dr. Sondra Come due to the weather.  Called her back and left a message that we will call her to reschedule the appointment.

## 2020-06-15 NOTE — Progress Notes (Incomplete)
  Patient Name: [name] [name] MRN: [id number] DOB: 23-Jul-1945 Referring Physician: [name] (Profile Not Attached) Date of Service: 05/11/2020 [facility], [place]                                                        End Of Treatment Note  Diagnoses: C55-Malignant neoplasm of uterus, part unspecified  Cancer Staging: Stage IA (pT1a, pN0) uterine carcinosarcoma  Intent: Curative  Radiation Treatment Dates: 04/16/2020 through 05/11/2020 Site Technique Total Dose (Gy) Dose per Fx (Gy) Completed Fx Beam Energies  Vagina: Pelvis HDR-brachy 30/30 6 5/5 Ir-192   Narrative: The patient tolerated radiation therapy relatively well. She did report some discomfort in the left lower quadrant/suprapubic area but did not require any pain medication. She denied nausea, constipation, and diarrhea. Abdomen was soft and non-tender on examination without palpable mass.  Plan: The patient will follow-up with radiation oncology in one month.  ________________________________________________   Blair Promise, PhD, MD  This document serves as a record of services personally performed by Gery Pray, MD. It was created on his behalf by Clerance Lav, a trained medical scribe. The creation of this record is based on the scribe's personal observations and the provider's statements to them. This document has been checked and approved by the attending provider.

## 2020-06-15 NOTE — Telephone Encounter (Signed)
CALLED PATIENT TO ASK ABOUT RESCHEDULING TODAY 'S APPT., PATIENT AGREED TO 07-09-20 @ 11:45 AM

## 2020-06-15 NOTE — Progress Notes (Incomplete)
Radiation Oncology         [phone] ________________________________  Name: [name] [name] MRN: [id number]  Date: 06/15/2020  DOB: [birth year]  Follow-Up Visit Note  CC: [name], MD  [name], MD  No diagnosis found.  Diagnosis: Stage IA (pT1a, pN0) uterine carcinosarcoma  Interval Since Last Radiation: One month and five days  Radiation Treatment Dates: 04/16/2020 through 05/11/2020 Site Technique Total Dose (Gy) Dose per Fx (Gy) Completed Fx Beam Energies  Vagina: Pelvis HDR-brachy 30/30 6 5/5 Ir-192    Narrative:  The patient returns today for routine follow-up. Since the end of treatment, she continues on chemotherapy with Carboplatin and Taxol under the care of Dr. Alvy Bimler. She was noted to experience multiple different side effects including cramps, neuropathy, and hyperglycemia.  On review of systems, she reports ***. She denies ***.     ALLERGIES:  has No Known Allergies.  Meds: Current Outpatient Medications  Medication Sig Dispense Refill  . balsalazide (COLAZAL) 750 MG capsule Take 2 capsules (1,500 mg total) by mouth 2 (two) times daily. 360 capsule 1  . calcium carbonate (CALCIUM 600) 600 MG TABS tablet Take 600 mg by mouth daily with breakfast.    . carvedilol (COREG) 3.125 MG tablet Take 1 tablet (3.125 mg total) by mouth 2 (two) times daily. 180 tablet 3  . Cholecalciferol (VITAMIN D) 50 MCG (2000 UT) tablet Take 2,000 Units by mouth daily.     . clopidogrel (PLAVIX) 75 MG tablet Take 1 tablet (75 mg total) by mouth daily. 90 tablet 3  . dexamethasone (DECADRON) 4 MG tablet Take 2 tabs at the night before chemotherapy, every 3 weeks, by mouth x 6 cycles 36 tablet 6  . docusate sodium (COLACE) 100 MG capsule Take 100 mg by mouth daily.     Marland Kitchen FLUoxetine (PROZAC) 10 MG capsule Take 10 mg by mouth daily.     Marland Kitchen lidocaine-prilocaine (EMLA) cream Apply to affected area once 30 g 3  . LORazepam (ATIVAN) 1 MG tablet Take 1 tablet (1 mg total) by mouth  every 8 (eight) hours as needed for anxiety (for anxiety). 60 tablet 1  . losartan-hydrochlorothiazide (HYZAAR) 100-12.5 MG tablet Take 1 tablet by mouth daily.    . magnesium oxide (MAG-OX) 400 (241.3 Mg) MG tablet Take 1 tablet (400 mg total) by mouth daily. (Patient taking differently: Take 400 mg by mouth 2 (two) times daily. ) 30 tablet 11  . metFORMIN (GLUCOPHAGE-XR) 500 MG 24 hr tablet Take 500 mg by mouth daily with supper.   5  . Multiple Vitamins-Minerals (MULTIVITAMIN WITH MINERALS) tablet Take 1 tablet by mouth daily.      . nitroGLYCERIN (NITROSTAT) 0.4 MG SL tablet Place 1 tablet (0.4 mg total) under the tongue every 5 (five) minutes as needed for chest pain. 25 tablet 3  . ondansetron (ZOFRAN) 8 MG tablet Take 1 tablet (8 mg total) by mouth every 8 (eight) hours as needed. 30 tablet 1  . oxyCODONE (OXY IR/ROXICODONE) 5 MG immediate release tablet Take 1 tablet (5 mg total) by mouth every 4 (four) hours as needed for severe pain. 30 tablet 0  . pantoprazole (PROTONIX) 40 MG tablet Take 1 tablet (40 mg total) by mouth daily. Must keep upcoming appt in Nov for future refills 90 tablet 0  . prochlorperazine (COMPAZINE) 10 MG tablet Take 1 tablet (10 mg total) by mouth every 6 (six) hours as needed (Nausea or vomiting). 30 tablet 1  . simvastatin (ZOCOR) 40 MG  tablet Take 1 tablet (40 mg total) by mouth every evening. 90 tablet 3   No current facility-administered medications for this encounter.    Physical Findings: The patient is in no acute distress. Patient is alert and oriented.  vitals were not taken for this visit. No significant changes. Lungs are clear to auscultation bilaterally. Heart has regular rate and rhythm. No palpable cervical, supraclavicular, or axillary adenopathy. Abdomen soft, non-tender, normal bowel sounds. Pelvic exam deferred in light of recent radiation therapy. ***  Lab Findings: Lab Results  Component Value Date   WBC 6.4 06/08/2020   HGB 10.0 (L)  06/08/2020   HCT 28.9 (L) 06/08/2020   MCV 94.4 06/08/2020   PLT 209 06/08/2020    Radiographic Findings: No results found.  Impression: Stage IA (pT1a, pN0) uterine carcinosarcoma  The patient is recovering from the effects of radiation.  ***  Plan: The patient is scheduled to follow up with Dr. Bertis Ruddy on 06/29/2020. She will follow up with Dr. Pricilla Holm in two months and with radiation oncology in five months.  Total time spent in this encounter was *** minutes which included reviewing the patient's most recent follow-ups with Dr. Bertis Ruddy, chemotherapy, physical examination, and documentation.  ____________________________________   Billie Lade, PhD, MD  This document serves as a record of services personally performed by Antony Blackbird, MD. It was created on his behalf by Nikki Dom, a trained medical scribe. The creation of this record is based on the scribe's personal observations and the provider's statements to them. This document has been checked and approved by the attending provider.

## 2020-06-26 ENCOUNTER — Telehealth: Payer: Self-pay | Admitting: Oncology

## 2020-06-26 NOTE — Telephone Encounter (Signed)
[name] [name] called and asked about rescheduling her appointments on Monday, 1/17 due to the weather.  Advised her appointments have been rescheduled to 07/13/20 and advised her of the times.  She verbalized understanding and agreement.

## 2020-06-29 ENCOUNTER — Inpatient Hospital Stay: Payer: PPO | Admitting: Hematology and Oncology

## 2020-06-29 ENCOUNTER — Inpatient Hospital Stay: Payer: PPO

## 2020-07-08 NOTE — Progress Notes (Signed)
Radiation Oncology         [phone] ________________________________  Name: [name] [name] MRN: [id number]  Date: 07/09/2020  DOB: 1945/07/19  Follow-Up Visit Note  CC: [name] Ada, MD  [name] Ada, MD    ICD-10-CM   1. 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[place])  C55     Diagnosis: Stage IA (pT1a, pN0) uterine carcinosarcoma  Interval Since Last Radiation: One month, four weeks, and one day  Radiation Treatment Dates: 04/16/2020 through 05/11/2020 Site Technique Total Dose (Gy) Dose per Fx (Gy) Completed Fx Beam Energies  Vagina: Pelvis HDR-brachy 30/30 6 5/5 Ir-192    Narrative:  The patient returns today for routine follow-up. Since the end of treatment, she continues on chemotherapy with Carboplatin and Taxol under the care of Dr. Alvy [name]. She was noted to experience multiple different side effects including cramps, neuropathy, and hyperglycemia.  On review of systems, she reports tolerating vaginal brachytherapy well without any side effects. She denies vaginal bleeding or significant discharge.     ALLERGIES:  has No Known Allergies.  Meds: Current Outpatient Medications  Medication Sig Dispense Refill  . balsalazide (COLAZAL) 750 MG capsule Take 2 capsules (1,500 mg total) by mouth 2 (two) times daily. 360 capsule 1  . calcium carbonate (OS-CAL) 600 MG TABS tablet Take 600 mg by mouth daily with breakfast.    . carvedilol (COREG) 3.125 MG tablet Take 1 tablet (3.125 mg total) by mouth 2 (two) times daily. 180 tablet 3  . Cholecalciferol (VITAMIN D) 50 MCG (2000 UT) tablet Take 2,000 Units by mouth daily.    . clopidogrel (PLAVIX) 75 MG tablet Take 1 tablet (75 mg total) by mouth daily. 90 tablet 3  . dexamethasone (DECADRON) 4 MG tablet Take 2 tabs at the night before chemotherapy, every 3 weeks, by mouth x 6 cycles 36 tablet 6  . docusate sodium (COLACE) 100 MG capsule Take 100 mg by mouth daily.     Marland Kitchen FLUoxetine (PROZAC) 10 MG capsule Take 10 mg by mouth daily.     Marland Kitchen  lidocaine-prilocaine (EMLA) cream Apply to affected area once 30 g 3  . LORazepam (ATIVAN) 1 MG tablet Take 1 tablet (1 mg total) by mouth every 8 (eight) hours as needed for anxiety (for anxiety). 60 tablet 1  . losartan-hydrochlorothiazide (HYZAAR) 100-12.5 MG tablet Take 1 tablet by mouth daily.    . magnesium oxide (MAG-OX) 400 (241.3 Mg) MG tablet Take 1 tablet (400 mg total) by mouth daily. (Patient taking differently: Take 400 mg by mouth 2 (two) times daily.) 30 tablet 11  . metFORMIN (GLUCOPHAGE-XR) 500 MG 24 hr tablet Take 500 mg by mouth daily with supper.   5  . Multiple Vitamins-Minerals (MULTIVITAMIN WITH MINERALS) tablet Take 1 tablet by mouth daily.    . nitroGLYCERIN (NITROSTAT) 0.4 MG SL tablet Place 1 tablet (0.4 mg total) under the tongue every 5 (five) minutes as needed for chest pain. 25 tablet 3  . ondansetron (ZOFRAN) 8 MG tablet Take 1 tablet (8 mg total) by mouth every 8 (eight) hours as needed. 30 tablet 1  . oxyCODONE (OXY IR/ROXICODONE) 5 MG immediate release tablet Take 1 tablet (5 mg total) by mouth every 4 (four) hours as needed for severe pain. 30 tablet 0  . pantoprazole (PROTONIX) 40 MG tablet Take 1 tablet (40 mg total) by mouth daily. Must keep upcoming appt in Nov for future refills 90 tablet 0  . prochlorperazine (COMPAZINE) 10 MG tablet Take 1 tablet (10 mg total)  by mouth every 6 (six) hours as needed (Nausea or vomiting). 30 tablet 1  . simvastatin (ZOCOR) 40 MG tablet Take 1 tablet (40 mg total) by mouth every evening. 90 tablet 3   No current facility-administered medications for this encounter.    Physical Findings: The patient is in no acute distress. Patient is alert and oriented.  height is 5' 6"  (1.676 m) and weight is 165 lb 4 oz (75 kg). Her temporal temperature is 97.2 F (36.2 C) (abnormal). Her blood pressure is 119/61 and her pulse is 83. Her respiration is 18 and oxygen saturation is 99%.  Lungs are clear to auscultation bilaterally. Heart  has regular rate and rhythm. No palpable cervical, supraclavicular, or axillary adenopathy. Abdomen soft, non-tender, normal bowel sounds. Pelvic exam deferred in light of recent radiation therapy.   Lab Findings: Lab Results  Component Value Date   WBC 6.4 06/08/2020   HGB 10.0 (L) 06/08/2020   HCT 28.9 (L) 06/08/2020   MCV 94.4 06/08/2020   PLT 209 06/08/2020    Radiographic Findings: No results found.  Impression: Stage IA (pT1a, pN0) uterine carcinosarcoma  Patient tolerated her vaginal brachytherapy well without any obvious side effects.   Plan: The patient is scheduled to follow up with Dr. Alvy [name] soon and receive her last cycle of chemotherapy.  she will follow up with Dr. Berline [name] in two months and with radiation oncology in five months. Today the patient was given a vaginal dilator and instructions on its use.  Total time spent in this encounter was 20  minutes which included reviewing the patient's most recent follow-ups with Dr. Alvy [name], chemotherapy, physical examination, and documentation.  ____________________________________   [name] Promise, PhD, MD  This document serves as a record of services personally performed by [name] Pray, MD. It was created on his behalf by [name] [name], a trained medical scribe. The creation of this record is based on the scribe's personal observations and the provider's statements to them. This document has been checked and approved by the attending provider.

## 2020-07-09 ENCOUNTER — Encounter: Payer: Self-pay | Admitting: Radiation Oncology

## 2020-07-09 ENCOUNTER — Ambulatory Visit
Admission: RE | Admit: 2020-07-09 | Discharge: 2020-07-09 | Disposition: A | Discharge status: Home / Self Care | Payer: PPO | Source: Ambulatory Visit | Attending: Radiation Oncology | Admitting: Radiation Oncology

## 2020-07-09 ENCOUNTER — Other Ambulatory Visit: Payer: Self-pay

## 2020-07-09 DIAGNOSIS — Z79899 Other long term (current) drug therapy: Secondary | ICD-10-CM | POA: Insufficient documentation

## 2020-07-09 DIAGNOSIS — Z923 Personal history of irradiation: Secondary | ICD-10-CM | POA: Insufficient documentation

## 2020-07-09 DIAGNOSIS — Z9221 Personal history of antineoplastic chemotherapy: Secondary | ICD-10-CM | POA: Diagnosis not present

## 2020-07-09 DIAGNOSIS — C55 Malignant neoplasm of uterus, part unspecified: Secondary | ICD-10-CM | POA: Diagnosis not present

## 2020-07-09 DIAGNOSIS — Z7984 Long term (current) use of oral hypoglycemic drugs: Secondary | ICD-10-CM | POA: Insufficient documentation

## 2020-07-09 NOTE — Progress Notes (Signed)
Patient is here today for first follow up post HDR treatment to uterine completed on 05/11/2020.  Patient denies having any pain.  Patient denies diarrhea/constipation.  Patient reports mild nausea but has antiemetic to resolve.  Last chemo is planned for Monday 07/13/2020.  Denies blood vaginally/rectally.  Reports still has issues with urination but states that it is improving.  Appetite is slightly improving.  Reports fatigue.  States she is still grieving her spouse.  Provided vaginal dilator XS+ and S and education.  Vitals:   07/09/20 1156  BP: 119/61  Pulse: 83  Resp: 18  Temp: (!) 97.2 F (36.2 C)  TempSrc: Temporal  SpO2: 99%  Weight: 165 lb 4 oz (75 kg)  Height: 5' 6"  (1.676 m)

## 2020-07-13 ENCOUNTER — Inpatient Hospital Stay: Payer: PPO | Admitting: Hematology and Oncology

## 2020-07-13 ENCOUNTER — Inpatient Hospital Stay: Payer: PPO | Attending: Gynecologic Oncology

## 2020-07-13 ENCOUNTER — Other Ambulatory Visit: Payer: Self-pay

## 2020-07-13 ENCOUNTER — Inpatient Hospital Stay: Payer: PPO

## 2020-07-13 ENCOUNTER — Encounter: Payer: Self-pay | Admitting: Hematology and Oncology

## 2020-07-13 VITALS — BP 129/63 | HR 85 | Temp 97.6°F | Resp 18 | Ht 66.0 in | Wt 162.4 lb

## 2020-07-13 DIAGNOSIS — R252 Cramp and spasm: Secondary | ICD-10-CM | POA: Diagnosis not present

## 2020-07-13 DIAGNOSIS — D6481 Anemia due to antineoplastic chemotherapy: Secondary | ICD-10-CM | POA: Insufficient documentation

## 2020-07-13 DIAGNOSIS — C55 Malignant neoplasm of uterus, part unspecified: Secondary | ICD-10-CM | POA: Insufficient documentation

## 2020-07-13 DIAGNOSIS — R739 Hyperglycemia, unspecified: Secondary | ICD-10-CM | POA: Insufficient documentation

## 2020-07-13 DIAGNOSIS — G620 Drug-induced polyneuropathy: Secondary | ICD-10-CM

## 2020-07-13 DIAGNOSIS — T50905A Adverse effect of unspecified drugs, medicaments and biological substances, initial encounter: Secondary | ICD-10-CM

## 2020-07-13 DIAGNOSIS — Z5111 Encounter for antineoplastic chemotherapy: Secondary | ICD-10-CM | POA: Insufficient documentation

## 2020-07-13 DIAGNOSIS — T451X5D Adverse effect of antineoplastic and immunosuppressive drugs, subsequent encounter: Secondary | ICD-10-CM

## 2020-07-13 DIAGNOSIS — T451X5A Adverse effect of antineoplastic and immunosuppressive drugs, initial encounter: Secondary | ICD-10-CM

## 2020-07-13 LAB — CBC WITH DIFFERENTIAL (CANCER CENTER ONLY)
Abs Immature Granulocytes: 0.02 10*3/uL (ref 0.00–0.07)
Basophils Absolute: 0 10*3/uL (ref 0.0–0.1)
Basophils Relative: 0 %
Eosinophils Absolute: 0 10*3/uL (ref 0.0–0.5)
Eosinophils Relative: 0 %
HCT: 29.1 % — ABNORMAL LOW (ref 36.0–46.0)
Hemoglobin: 9.8 g/dL — ABNORMAL LOW (ref 12.0–15.0)
Immature Granulocytes: 0 %
Lymphocytes Relative: 16 %
Lymphs Abs: 1 10*3/uL (ref 0.7–4.0)
MCH: 34.5 pg — ABNORMAL HIGH (ref 26.0–34.0)
MCHC: 33.7 g/dL (ref 30.0–36.0)
MCV: 102.5 fL — ABNORMAL HIGH (ref 80.0–100.0)
Monocytes Absolute: 0.4 10*3/uL (ref 0.1–1.0)
Monocytes Relative: 7 %
Neutro Abs: 4.6 10*3/uL (ref 1.7–7.7)
Neutrophils Relative %: 77 %
Platelet Count: 255 10*3/uL (ref 150–400)
RBC: 2.84 MIL/uL — ABNORMAL LOW (ref 3.87–5.11)
RDW: 15.2 % (ref 11.5–15.5)
WBC Count: 6 10*3/uL (ref 4.0–10.5)
nRBC: 0 % (ref 0.0–0.2)

## 2020-07-13 LAB — CMP (CANCER CENTER ONLY)
ALT: 10 U/L (ref 0–44)
AST: 17 U/L (ref 15–41)
Albumin: 3.9 g/dL (ref 3.5–5.0)
Alkaline Phosphatase: 60 U/L (ref 38–126)
Anion gap: 13 (ref 5–15)
BUN: 15 mg/dL (ref 8–23)
CO2: 25 mmol/L (ref 22–32)
Calcium: 9.2 mg/dL (ref 8.9–10.3)
Chloride: 104 mmol/L (ref 98–111)
Creatinine: 0.86 mg/dL (ref 0.44–1.00)
GFR, Estimated: >60 mL/min (ref >60)
Glucose, Bld: 140 mg/dL — ABNORMAL HIGH (ref 70–99)
Potassium: 3.8 mmol/L (ref 3.5–5.1)
Sodium: 142 mmol/L (ref 135–145)
Total Bilirubin: 0.7 mg/dL (ref 0.3–1.2)
Total Protein: 6.8 g/dL (ref 6.5–8.1)

## 2020-07-13 MED ORDER — FAMOTIDINE IN NACL 20-0.9 MG/50ML-% IV SOLN
20.0000 mg | Freq: Once | INTRAVENOUS | Status: AC
  Administered 2020-07-13: 20 mg via INTRAVENOUS

## 2020-07-13 MED ORDER — CARBOPLATIN CHEMO INJECTION 600 MG/60ML
498.0000 mg | Freq: Once | INTRAVENOUS | Status: AC
Start: 2020-07-13 — End: 2020-07-13
  Administered 2020-07-13: 500 mg via INTRAVENOUS
  Filled 2020-07-13: qty 50

## 2020-07-13 MED ORDER — SODIUM CHLORIDE 0.9 % IV SOLN
10.0000 mg | Freq: Once | INTRAVENOUS | Status: AC
  Administered 2020-07-13: 10 mg via INTRAVENOUS
  Filled 2020-07-13: qty 10

## 2020-07-13 MED ORDER — SODIUM CHLORIDE 0.9 % IV SOLN
105.0000 mg/m2 | Freq: Once | INTRAVENOUS | Status: AC
  Administered 2020-07-13: 198 mg via INTRAVENOUS
  Filled 2020-07-13: qty 33

## 2020-07-13 MED ORDER — SODIUM CHLORIDE 0.9 % IV SOLN
150.0000 mg | Freq: Once | INTRAVENOUS | Status: AC
  Administered 2020-07-13: 150 mg via INTRAVENOUS
  Filled 2020-07-13: qty 150

## 2020-07-13 MED ORDER — SODIUM CHLORIDE 0.9 % IV SOLN
Freq: Once | INTRAVENOUS | Status: AC
  Filled 2020-07-13: qty 250

## 2020-07-13 MED ORDER — SODIUM CHLORIDE 0.9% FLUSH
10.0000 mL | Freq: Once | INTRAVENOUS | Status: AC
  Administered 2020-07-13: 10 mL
  Filled 2020-07-13: qty 10

## 2020-07-13 MED ORDER — PALONOSETRON HCL INJECTION 0.25 MG/5ML
0.2500 mg | Freq: Once | INTRAVENOUS | Status: AC
  Administered 2020-07-13: 0.25 mg via INTRAVENOUS

## 2020-07-13 NOTE — Patient Instructions (Signed)
[facility] Discharge Instructions for Patients Receiving Chemotherapy  Today you received the following chemotherapy agents carboplatin, paclitaxel.  To help prevent nausea and vomiting after your treatment, we encourage you to take your nausea medication as directed.    If you develop nausea and vomiting that is not controlled by your nausea medication, call the clinic.   BELOW ARE SYMPTOMS THAT SHOULD BE REPORTED IMMEDIATELY:  *FEVER GREATER THAN 100.5 F  *CHILLS WITH OR WITHOUT FEVER  NAUSEA AND VOMITING THAT IS NOT CONTROLLED WITH YOUR NAUSEA MEDICATION  *UNUSUAL SHORTNESS OF BREATH  *UNUSUAL BRUISING OR BLEEDING  TENDERNESS IN MOUTH AND THROAT WITH OR WITHOUT PRESENCE OF ULCERS  *URINARY PROBLEMS  *BOWEL PROBLEMS  UNUSUAL RASH Items with * indicate a potential emergency and should be followed up as soon as possible.  Feel free to call the clinic should you have any questions or concerns. The clinic phone number is [phone].  Please show the CHEMO ALERT CARD at check-in to the Emergency Department and triage nurse.   

## 2020-07-13 NOTE — Assessment & Plan Note (Signed)
This is likely due to recent treatment. The patient denies recent history of bleeding such as epistaxis, hematuria or hematochezia. She is asymptomatic from the anemia. I will observe for now.  She does not require transfusion now. I will continue the chemotherapy at current dose without dosage adjustment.  If the anemia gets progressive worse in the future, I might have to delay her treatment or adjust the chemotherapy dose.

## 2020-07-13 NOTE — Assessment & Plan Note (Signed)
she has mild peripheral neuropathy, likely related to side effects of treatment. °I plan to reduce the dose of treatment as outlined above.  °I explained to the patient the rationale of this strategy and reassured the patient it would not compromise the efficacy of treatment ° °

## 2020-07-13 NOTE — Progress Notes (Signed)
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[facility]) She continues to experience multiple different side effects with cramps, neuropathy, hyperglycemia and others I will continue to omit premed Benadryl, add additional lorazepam as muscle relaxant and plan further dose reduction of Taxol given slight worsening neuropathy We will proceed with similar dose adjusted to her weight and kidney function  With recent omission of morning dexamethasone, her blood sugar is better She does not need insulin today After her last cycle of treatment today, I plan to order CT imaging to document response to treatment before referring her back to GYN surgeon for follow-up  Anemia due to antineoplastic chemotherapy This is likely due to recent treatment. The patient denies recent history of bleeding such as epistaxis, hematuria or hematochezia. She is asymptomatic from the anemia. I will observe for now.  She does not require transfusion now. I will continue the chemotherapy at current dose without dosage adjustment.  If the anemia gets progressive worse in the future, I might have to delay her treatment or adjust the chemotherapy dose.   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 I plan to reduce the dose of treatment as outlined above.  I explained to the patient the rationale of this strategy and reassured the patient it would not compromise the efficacy of treatment   Drug-induced hyperglycemia Her blood sugar is better She does not need insulin today She will continue aggressive dietary modification   Orders Placed This Encounter  Procedures  . CT ABDOMEN PELVIS W CONTRAST     Standing Status:   Future    Standing Expiration Date:   07/13/2021    Order Specific Question:   If indicated for the ordered procedure, I authorize the administration of contrast media per Radiology protocol    Answer:   Yes    Order Specific Question:   Preferred imaging location?    Answer:   [facility]    Order Specific Question:   Radiology Contrast Protocol - do NOT remove file path    Answer:   [email or web address]    All questions were answered. The patient knows to call the clinic with any problems, questions or concerns. The total time spent in the appointment was 30 minutes encounter with patients including review of chart and various tests results, discussions about plan of care and coordination of care plan   Heath Lark, MD 07/13/2020 2:23 PM  INTERVAL HISTORY: Please see below for problem oriented charting. She is seen prior to cycle 6 of treatment Since last time I saw her, she has slight worsening neuropathy affecting her fingers and feet Denies nausea She has some constipation, alleviated by laxatives Her appetite is fair although she has lost a bit of weight She has questions regarding future plan and follow-up No recent fever or chills  SUMMARY OF ONCOLOGIC HISTORY: Oncology History Overview Note  IHC MMR intact MSI-stable Carcinosarcoma   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[place])  01/01/2020 Imaging   CT A/P: 1. Severe distension of the endometrium highly suspicious for cervical obstruction or stenosis possibly due to a neoplastic process in this postmenopausal female with history of vaginal bleeding. Further evaluation with hysteroscopy is  recommended. 2. Diarrheal state. Correlation with clinical  exam and stool cultures recommended. No bowel obstruction. Normal appendix.   01/03/2020 Surgery   D&C A. ENDOMETRIUM, CURETTAGE:  -  Poorly differentiated malignancy  -  See comment  COMMENT:  The material consists of a  poorly differentiated malignancy with extensive necrosis.  By immunohistochemistry, p16 is positive and there is focal cytokeratin AE1/3 positivity but negative for cytokeratin 7, cytokeratin 5/6, CD20, CD3, p53, WT-1, Pax-8, and S100. The differential includes poorly differentiated carcinoma and high-grade stromal sarcoma.    02/04/2020 Surgery   Robotic-assisted laparoscopic total hysterectomy with bilateral salpingoophorectomy, SLN biopsy, left pelvic LND   On EUA, 10cm mobile uterus. ON intra-abdominal entry, normal upper abdominal survey. Normal appearing omentum, small and large bowel. Uterus 10cm and bulbous. Tortuous and hyperemic ovarian vasculature. Some tubal metaplasia vs. Metastatic disease. Normal appearing ovaries. Mapping successful to deep right obturator SLN and presacral SLN. NO mapping on left. Enlarge deep obturator lymph on the left. Given high grade histology, short small bowel mesentery, and recent COVID infection, decision made not to proceed with left PA LND (would have likely required open procedure. NO intra-abdominal or pelvic evidence of disease.    02/04/2020 Pathology Results   FINAL MICROSCOPIC DIAGNOSIS:   A. SENTINEL LYMPH NODE, RIGHT OBTURATOR, EXCISION:  -  No carcinoma identified in one lymph node (0/1)  -  See comment   B. UTERUS AND BILATERAL FALLOPIAN TUBES AND OVARIES, HYSTERECTOMY AND  BILATERAL SALPINGO-OOPHORECTOMY:  -  Carcinosarcoma confined to the uterus and involving less than half of the myometrium  -  See oncology table and comment below   C. LYMPH NODES, LEFT PELVIC, REGIONAL RESECTION:  -  No carcinoma identified in three lymph nodes (0/3)   D. SENTINEL LYMPH NODE, PRESACRAL, EXCISION:  -  No carcinoma identified in one lymph node (0/1)   COMMENT:   A.  Cytokeratin AE1/3 was performed on the sentinel lymph nodes to exclude micrometastasis.  There is no evidence of metastatic carcinoma by immunohistochemistry.   B.  The tumor consists of  poorly cohesive epithelioid and rhabdoid cells.  A panel of immunohistochemistry (p16, f, p53, PAX 8, SMA, CD10, CD45, CD 99, CD117, CD138, cytokeratin 7, cyclin D1, cytokeratin AE1/3, EMA and GATA3) is performed to better classify this neoplasm.  By immunohistochemistry, the neoplastic cells are positive for p16, p53 and  PAX 8.  The epithelioid cells are positive for cytokeratin AE1/3 and EMA while the rhabdoid cells show positivity for desmin. Overall, the immunophenotype and morphology supports the diagnosis of carcinosarcoma.    ONCOLOGY TABLE:   UTERUS, CARCINOMA OR CARCINOSARCOMA   Procedure: Total hysterectomy and bilateral salpingo-oophorectomy  Histologic type: Carcinosarcoma  Histologic Grade: FIGO grade 3  Myometrial invasion: Estimated to be less than 50%  Uterine Serosa Involvement: Not identified  Cervical stromal involvement: Not identified  Extent of involvement of other organs: Not identified  Lymphovascular invasion: Not identified  Regional Lymph Nodes:       Examined:       2 Sentinel                               3 non-sentinel                               5 total        Lymph nodes with metastasis: 0        Isolated tumor  cells (<0.2 mm): 0        Micrometastasis:  (>0.2 mm and < 2.0 mm): 0        Macrometastasis: (>2.0 mm): 0        Extracapsular extension: N/A  Representative Tumor Block: B1  MMR / MSI testing: Will be ordered  Pathologic Stage Classification (pTNM, AJCC 8th edition):  pT1a, pN0  Comments: See above    02/04/2020 Cancer Staging   Staging form: Corpus Uteri - Carcinoma and Carcinosarcoma, AJCC 8th Edition - Clinical stage from 02/04/2020: FIGO Stage IA (cT1a, cN0(sn), cM0) - Signed by Lafonda Mosses, MD on 02/12/2020   03/06/2020 Imaging   Placement of single lumen port a cath via right internal jugular vein. The catheter tip lies at the cavo-atrial junction. A power injectable port a cath was placed and is ready for immediate use.    03/12/2020 -  Chemotherapy   The patient had carboplatin and taxol for chemotherapy treatment.       REVIEW OF SYSTEMS:   Constitutional: Denies fevers, chills or abnormal weight loss Eyes: Denies blurriness of vision Ears, nose, mouth, throat, and face: Denies mucositis or sore throat Respiratory: Denies cough, dyspnea or wheezes Cardiovascular: Denies palpitation, chest discomfort or lower extremity swelling Gastrointestinal:  Denies nausea, heartburn or change in bowel habits Skin: Denies abnormal skin rashes Lymphatics: Denies new lymphadenopathy or easy bruising Behavioral/Psych: Mood is stable, no new changes  All other systems were reviewed with the patient and are negative.  I have reviewed the past medical history, past surgical history, social history and family history with the patient and they are unchanged from previous note.  ALLERGIES:  has No Known Allergies.  MEDICATIONS:  Current Outpatient Medications  Medication Sig Dispense Refill  . balsalazide (COLAZAL) 750 MG capsule Take 2 capsules (1,500 mg total) by mouth 2 (two) times daily. 360 capsule 1  . calcium carbonate (OS-CAL) 600 MG TABS tablet Take 600 mg by mouth daily with breakfast.    . carvedilol (COREG) 3.125 MG tablet Take 1 tablet (3.125 mg total) by mouth 2 (two) times daily. 180 tablet 3  . Cholecalciferol (VITAMIN D) 50 MCG (2000 UT) tablet Take 2,000 Units by mouth daily.    . clopidogrel (PLAVIX) 75 MG tablet Take 1 tablet (75 mg total) by mouth daily. 90 tablet 3  . dexamethasone (DECADRON) 4 MG tablet Take 2 tabs at the night before chemotherapy, every 3 weeks, by mouth x 6 cycles 36 tablet 6  . docusate sodium (COLACE) 100 MG capsule Take 100 mg by mouth daily.     Marland Kitchen FLUoxetine (PROZAC) 10 MG capsule Take 10 mg by mouth daily.     Marland Kitchen lidocaine-prilocaine (EMLA) cream Apply to affected area once 30 g 3  . LORazepam (ATIVAN) 1 MG tablet Take 1 tablet (1 mg total) by mouth every 8 (eight) hours as  needed for anxiety (for anxiety). 60 tablet 1  . losartan-hydrochlorothiazide (HYZAAR) 100-12.5 MG tablet Take 1 tablet by mouth daily.    . magnesium oxide (MAG-OX) 400 (241.3 Mg) MG tablet Take 1 tablet (400 mg total) by mouth daily. (Patient taking differently: Take 400 mg by mouth 2 (two) times daily.) 30 tablet 11  . metFORMIN (GLUCOPHAGE-XR) 500 MG 24 hr tablet Take 500 mg by mouth daily with supper.   5  . Multiple Vitamins-Minerals (MULTIVITAMIN WITH MINERALS) tablet Take 1 tablet by mouth daily.    . nitroGLYCERIN (NITROSTAT) 0.4 MG SL tablet Place 1 tablet (0.4 mg  total) under the tongue every 5 (five) minutes as needed for chest pain. 25 tablet 3  . ondansetron (ZOFRAN) 8 MG tablet Take 1 tablet (8 mg total) by mouth every 8 (eight) hours as needed. 30 tablet 1  . oxyCODONE (OXY IR/ROXICODONE) 5 MG immediate release tablet Take 1 tablet (5 mg total) by mouth every 4 (four) hours as needed for severe pain. 30 tablet 0  . pantoprazole (PROTONIX) 40 MG tablet Take 1 tablet (40 mg total) by mouth daily. Must keep upcoming appt in Nov for future refills 90 tablet 0  . prochlorperazine (COMPAZINE) 10 MG tablet Take 1 tablet (10 mg total) by mouth every 6 (six) hours as needed (Nausea or vomiting). 30 tablet 1  . simvastatin (ZOCOR) 40 MG tablet Take 1 tablet (40 mg total) by mouth every evening. 90 tablet 3   No current facility-administered medications for this visit.   Facility-Administered Medications Ordered in Other Visits  Medication Dose Route Frequency Provider Last Rate Last Admin  . CARBOplatin (PARAPLATIN) 500 mg in sodium chloride 0.9 % 250 mL chemo infusion  500 mg Intravenous Once Alvy Bimler, Deunte Bledsoe, MD      . PACLitaxel (TAXOL) 198 mg in sodium chloride 0.9 % 250 mL chemo infusion (> 57m/m2)  105 mg/m2 (Treatment Plan Recorded) Intravenous Once GHeath Lark MD 94 mL/hr at 07/13/20 1340 198 mg at 07/13/20 1340    PHYSICAL EXAMINATION: ECOG PERFORMANCE STATUS: 1 - Symptomatic but  completely ambulatory  Vitals:   07/13/20 1047  BP: 129/63  Pulse: 85  Resp: 18  Temp: 97.6 F (36.4 C)  SpO2: 99%   Filed Weights   07/13/20 1047  Weight: 162 lb 6.4 oz (73.7 kg)    GENERAL:alert, no distress and comfortable Musculoskeletal:no cyanosis of digits and no clubbing  NEURO: alert & oriented x 3 with fluent speech, no focal motor/sensory deficits  LABORATORY DATA:  I have reviewed the data as listed    Component Value Date/Time   NA 142 07/13/2020 1005   K 3.8 07/13/2020 1005   CL 104 07/13/2020 1005   CO2 25 07/13/2020 1005   GLUCOSE 140 (H) 07/13/2020 1005   BUN 15 07/13/2020 1005   CREATININE 0.86 07/13/2020 1005   CREATININE 0.84 03/24/2015 0938   CALCIUM 9.2 07/13/2020 1005   PROT 6.8 07/13/2020 1005   PROT 6.5 08/17/2017 0859   ALBUMIN 3.9 07/13/2020 1005   ALBUMIN 4.3 08/17/2017 0859   AST 17 07/13/2020 1005   ALT 10 07/13/2020 1005   ALKPHOS 60 07/13/2020 1005   BILITOT 0.7 07/13/2020 1005   GFRNONAA >60 07/13/2020 1005   GFRAA >60 03/11/2020 1509    No results found for: SPEP, UPEP  Lab Results  Component Value Date   WBC 6.0 07/13/2020   NEUTROABS 4.6 07/13/2020   HGB 9.8 (L) 07/13/2020   HCT 29.1 (L) 07/13/2020   MCV 102.5 (H) 07/13/2020   PLT 255 07/13/2020      Chemistry      Component Value Date/Time   NA 142 07/13/2020 1005   K 3.8 07/13/2020 1005   CL 104 07/13/2020 1005   CO2 25 07/13/2020 1005   BUN 15 07/13/2020 1005   CREATININE 0.86 07/13/2020 1005   CREATININE 0.84 03/24/2015 0938      Component Value Date/Time   CALCIUM 9.2 07/13/2020 1005   ALKPHOS 60 07/13/2020 1005   AST 17 07/13/2020 1005   ALT 10 07/13/2020 1005   BILITOT 0.7 07/13/2020 1005

## 2020-07-13 NOTE — Assessment & Plan Note (Signed)
Her blood sugar is better She does not need insulin today She will continue aggressive dietary modification

## 2020-07-13 NOTE — Patient Instructions (Signed)
Implanted [facility] Guide An implanted port is a device that is placed under the skin. It is usually placed in the chest. The device can be used to give IV medicine, to take blood, or for dialysis. You may have an implanted port if:  You need IV medicine that would be irritating to the small veins in your hands or arms.  You need IV medicines, such as antibiotics, for a long period of time.  You need IV nutrition for a long period of time.  You need dialysis. When you have a port, your health care provider can choose to use the port instead of veins in your arms for these procedures. You may have fewer limitations when using a port than you would if you used other types of long-term IVs, and you will likely be able to return to normal activities after your incision heals. An implanted port has two main parts:  Reservoir. The reservoir is the part where a needle is inserted to give medicines or draw blood. The reservoir is round. After it is placed, it appears as a small, raised area under your skin.  Catheter. The catheter is a thin, flexible tube that connects the reservoir to a vein. Medicine that is inserted into the reservoir goes into the catheter and then into the vein. How is my port accessed? To access your port:  A numbing cream may be placed on the skin over the port site.  Your health care provider will put on a mask and sterile gloves.  The skin over your port will be cleaned carefully with a germ-killing soap and allowed to dry.  Your health care provider will gently pinch the port and insert a needle into it.  Your health care provider will check for a blood return to make sure the port is in the vein and is not clogged.  If your port needs to remain accessed to get medicine continuously (constant infusion), your health care provider will place a clear bandage (dressing) over the needle site. The dressing and needle will need to be changed every week, or as told by your  health care provider. What is flushing? Flushing helps keep the port from getting clogged. Follow instructions from your health care provider about how and when to flush the port. Ports are usually flushed with saline solution or a medicine called heparin. The need for flushing will depend on how the port is used:  If the port is only used from time to time to give medicines or draw blood, the port may need to be flushed: ? Before and after medicines have been given. ? Before and after blood has been drawn. ? As part of routine maintenance. Flushing may be recommended every 4-6 weeks.  If a constant infusion is running, the port may not need to be flushed.  Throw away any syringes in a disposal container that is meant for sharp items (sharps container). You can buy a sharps container from a pharmacy, or you can make one by using an empty hard plastic bottle with a cover. How long will my port stay implanted? The port can stay in for as long as your health care provider thinks it is needed. When it is time for the port to come out, a surgery will be done to remove it. The surgery will be similar to the procedure that was done to put the port in. Follow these instructions at home:  Flush your port as told by your health care  provider.  If you need an infusion over several days, follow instructions from your health care provider about how to take care of your port site. Make sure you: ? Wash your hands with soap and water before you change your dressing. If soap and water are not available, use alcohol-based hand sanitizer. ? Change your dressing as told by your health care provider. ? Place any used dressings or infusion bags into a plastic bag. Throw that bag in the trash. ? Keep the dressing that covers the needle clean and dry. Do not get it wet. ? Do not use scissors or sharp objects near the tube. ? Keep the tube clamped, unless it is being used.  Check your port site every day for signs  of infection. Check for: ? Redness, swelling, or pain. ? Fluid or blood. ? Pus or a bad smell.  Protect the skin around the port site. ? Avoid wearing bra straps that rub or irritate the site. ? Protect the skin around your port from seat belts. Place a soft pad over your chest if needed.  Bathe or shower as told by your health care provider. The site may get wet as long as you are not actively receiving an infusion.  Return to your normal activities as told by your health care provider. Ask your health care provider what activities are safe for you.  Carry a medical alert card or wear a medical alert bracelet at all times. This will let health care providers know that you have an implanted port in case of an emergency.   Get help right away if:  You have redness, swelling, or pain at the port site.  You have fluid or blood coming from your port site.  You have pus or a bad smell coming from the port site.  You have a fever. Summary  Implanted ports are usually placed in the chest for long-term IV access.  Follow instructions from your health care provider about flushing the port and changing bandages (dressings).  Take care of the area around your port by avoiding clothing that puts pressure on the area, and by watching for signs of infection.  Protect the skin around your port from seat belts. Place a soft pad over your chest if needed.  Get help right away if you have a fever or you have redness, swelling, pain, drainage, or a bad smell at the port site. This information is not intended to replace advice given to you by your health care provider. Make sure you discuss any questions you have with your health care provider. Document Revised: 10/14/2019 Document Reviewed: 10/14/2019 Elsevier Patient Education  [place].

## 2020-07-13 NOTE — Assessment & Plan Note (Addendum)
She continues to experience multiple different side effects with cramps, neuropathy, hyperglycemia and others I will continue to omit premed Benadryl, add additional lorazepam as muscle relaxant and plan further dose reduction of Taxol given slight worsening neuropathy We will proceed with similar dose adjusted to her weight and kidney function  With recent omission of morning dexamethasone, her blood sugar is better She does not need insulin today After her last cycle of treatment today, I plan to order CT imaging to document response to treatment before referring her back to GYN surgeon for follow-up

## 2020-07-15 ENCOUNTER — Telehealth: Payer: Self-pay | Admitting: *Deleted

## 2020-07-15 NOTE — Telephone Encounter (Signed)
CALLED PATIENT TO INFORM OF FU WITH DR. Berline Lopes ON 09-07-20- ARRIVALTIME- 1:30 PM, SPOKE WITH PATIENT AND SHE IS AWARE OF THIS APPT.

## 2020-07-15 NOTE — Telephone Encounter (Signed)
Enid Derry from radiation called and scheduled a follow up appt for the end of March. Appt made and Enid Derry will contact the patient.

## 2020-07-29 DIAGNOSIS — Z7984 Long term (current) use of oral hypoglycemic drugs: Secondary | ICD-10-CM | POA: Diagnosis not present

## 2020-07-29 DIAGNOSIS — Z1389 Encounter for screening for other disorder: Secondary | ICD-10-CM | POA: Diagnosis not present

## 2020-07-29 DIAGNOSIS — I10 Essential (primary) hypertension: Secondary | ICD-10-CM | POA: Diagnosis not present

## 2020-07-29 DIAGNOSIS — E1169 Type 2 diabetes mellitus with other specified complication: Secondary | ICD-10-CM | POA: Diagnosis not present

## 2020-07-29 DIAGNOSIS — Z0000 Encounter for general adult medical examination without abnormal findings: Secondary | ICD-10-CM | POA: Diagnosis not present

## 2020-07-29 DIAGNOSIS — C541 Malignant neoplasm of endometrium: Secondary | ICD-10-CM | POA: Diagnosis not present

## 2020-07-29 DIAGNOSIS — E785 Hyperlipidemia, unspecified: Secondary | ICD-10-CM | POA: Diagnosis not present

## 2020-07-29 DIAGNOSIS — F419 Anxiety disorder, unspecified: Secondary | ICD-10-CM | POA: Diagnosis not present

## 2020-07-29 DIAGNOSIS — I2510 Atherosclerotic heart disease of native coronary artery without angina pectoris: Secondary | ICD-10-CM | POA: Diagnosis not present

## 2020-07-29 DIAGNOSIS — F3342 Major depressive disorder, recurrent, in full remission: Secondary | ICD-10-CM | POA: Diagnosis not present

## 2020-08-10 ENCOUNTER — Encounter (HOSPITAL_COMMUNITY): Payer: Self-pay

## 2020-08-10 ENCOUNTER — Inpatient Hospital Stay: Payer: PPO | Attending: Gynecologic Oncology

## 2020-08-10 ENCOUNTER — Ambulatory Visit (HOSPITAL_COMMUNITY)
Admission: RE | Admit: 2020-08-10 | Discharge: 2020-08-10 | Disposition: A | Discharge status: Home / Self Care | Payer: PPO | Source: Ambulatory Visit | Attending: Hematology and Oncology | Admitting: Hematology and Oncology

## 2020-08-10 ENCOUNTER — Other Ambulatory Visit: Payer: Self-pay

## 2020-08-10 ENCOUNTER — Inpatient Hospital Stay: Payer: PPO

## 2020-08-10 DIAGNOSIS — T451X5D Adverse effect of antineoplastic and immunosuppressive drugs, subsequent encounter: Secondary | ICD-10-CM | POA: Diagnosis not present

## 2020-08-10 DIAGNOSIS — C55 Malignant neoplasm of uterus, part unspecified: Secondary | ICD-10-CM | POA: Diagnosis not present

## 2020-08-10 DIAGNOSIS — N281 Cyst of kidney, acquired: Secondary | ICD-10-CM | POA: Diagnosis not present

## 2020-08-10 DIAGNOSIS — D6481 Anemia due to antineoplastic chemotherapy: Secondary | ICD-10-CM | POA: Insufficient documentation

## 2020-08-10 DIAGNOSIS — E1169 Type 2 diabetes mellitus with other specified complication: Secondary | ICD-10-CM | POA: Diagnosis not present

## 2020-08-10 DIAGNOSIS — R252 Cramp and spasm: Secondary | ICD-10-CM | POA: Diagnosis not present

## 2020-08-10 DIAGNOSIS — G620 Drug-induced polyneuropathy: Secondary | ICD-10-CM | POA: Diagnosis not present

## 2020-08-10 DIAGNOSIS — Z90710 Acquired absence of both cervix and uterus: Secondary | ICD-10-CM | POA: Diagnosis not present

## 2020-08-10 DIAGNOSIS — I700 Atherosclerosis of aorta: Secondary | ICD-10-CM | POA: Diagnosis not present

## 2020-08-10 DIAGNOSIS — R739 Hyperglycemia, unspecified: Secondary | ICD-10-CM | POA: Diagnosis not present

## 2020-08-10 DIAGNOSIS — E785 Hyperlipidemia, unspecified: Secondary | ICD-10-CM | POA: Diagnosis not present

## 2020-08-10 LAB — CMP (CANCER CENTER ONLY)
ALT: 9 U/L (ref 0–44)
AST: 18 U/L (ref 15–41)
Albumin: 4.2 g/dL (ref 3.5–5.0)
Alkaline Phosphatase: 64 U/L (ref 38–126)
Anion gap: 12 (ref 5–15)
BUN: 13 mg/dL (ref 8–23)
CO2: 27 mmol/L (ref 22–32)
Calcium: 9.5 mg/dL (ref 8.9–10.3)
Chloride: 102 mmol/L (ref 98–111)
Creatinine: 0.78 mg/dL (ref 0.44–1.00)
GFR, Estimated: >60 mL/min (ref >60)
Glucose, Bld: 120 mg/dL — ABNORMAL HIGH (ref 70–99)
Potassium: 3.6 mmol/L (ref 3.5–5.1)
Sodium: 141 mmol/L (ref 135–145)
Total Bilirubin: 0.7 mg/dL (ref 0.3–1.2)
Total Protein: 7.2 g/dL (ref 6.5–8.1)

## 2020-08-10 LAB — CBC WITH DIFFERENTIAL (CANCER CENTER ONLY)
Abs Immature Granulocytes: 0.02 10*3/uL (ref 0.00–0.07)
Basophils Absolute: 0 10*3/uL (ref 0.0–0.1)
Basophils Relative: 0 %
Eosinophils Absolute: 0 10*3/uL (ref 0.0–0.5)
Eosinophils Relative: 1 %
HCT: 30 % — ABNORMAL LOW (ref 36.0–46.0)
Hemoglobin: 10.2 g/dL — ABNORMAL LOW (ref 12.0–15.0)
Immature Granulocytes: 1 %
Lymphocytes Relative: 35 %
Lymphs Abs: 1.6 10*3/uL (ref 0.7–4.0)
MCH: 34.7 pg — ABNORMAL HIGH (ref 26.0–34.0)
MCHC: 34 g/dL (ref 30.0–36.0)
MCV: 102 fL — ABNORMAL HIGH (ref 80.0–100.0)
Monocytes Absolute: 0.4 10*3/uL (ref 0.1–1.0)
Monocytes Relative: 8 %
Neutro Abs: 2.4 10*3/uL (ref 1.7–7.7)
Neutrophils Relative %: 55 %
Platelet Count: 134 10*3/uL — ABNORMAL LOW (ref 150–400)
RBC: 2.94 MIL/uL — ABNORMAL LOW (ref 3.87–5.11)
RDW: 13.8 % (ref 11.5–15.5)
WBC Count: 4.4 10*3/uL (ref 4.0–10.5)
nRBC: 0 % (ref 0.0–0.2)

## 2020-08-10 MED ORDER — HEPARIN SOD (PORK) LOCK FLUSH 100 UNIT/ML IV SOLN
INTRAVENOUS | Status: AC
  Filled 2020-08-10: qty 5

## 2020-08-10 MED ORDER — SODIUM CHLORIDE 0.9% FLUSH
10.0000 mL | Freq: Once | INTRAVENOUS | Status: AC
  Administered 2020-08-10: 10 mL
  Filled 2020-08-10: qty 10

## 2020-08-10 MED ORDER — IOHEXOL 300 MG/ML  SOLN
100.0000 mL | Freq: Once | INTRAMUSCULAR | Status: AC | PRN
  Administered 2020-08-10: 100 mL via INTRAVENOUS

## 2020-08-10 MED ORDER — HEPARIN SOD (PORK) LOCK FLUSH 100 UNIT/ML IV SOLN
500.0000 [IU] | Freq: Once | INTRAVENOUS | Status: AC
  Administered 2020-08-10: 500 [IU] via INTRAVENOUS

## 2020-08-11 ENCOUNTER — Inpatient Hospital Stay: Payer: PPO | Attending: Gynecologic Oncology | Admitting: Hematology and Oncology

## 2020-08-11 ENCOUNTER — Other Ambulatory Visit: Payer: Self-pay

## 2020-08-11 ENCOUNTER — Encounter: Payer: Self-pay | Admitting: Hematology and Oncology

## 2020-08-11 VITALS — BP 125/61 | HR 90 | Temp 98.9°F | Resp 18 | Ht 66.0 in | Wt 163.4 lb

## 2020-08-11 DIAGNOSIS — Z923 Personal history of irradiation: Secondary | ICD-10-CM | POA: Diagnosis not present

## 2020-08-11 DIAGNOSIS — E785 Hyperlipidemia, unspecified: Secondary | ICD-10-CM | POA: Diagnosis not present

## 2020-08-11 DIAGNOSIS — I252 Old myocardial infarction: Secondary | ICD-10-CM | POA: Diagnosis not present

## 2020-08-11 DIAGNOSIS — C55 Malignant neoplasm of uterus, part unspecified: Secondary | ICD-10-CM

## 2020-08-11 DIAGNOSIS — Z90722 Acquired absence of ovaries, bilateral: Secondary | ICD-10-CM | POA: Diagnosis not present

## 2020-08-11 DIAGNOSIS — Z7984 Long term (current) use of oral hypoglycemic drugs: Secondary | ICD-10-CM | POA: Insufficient documentation

## 2020-08-11 DIAGNOSIS — D61818 Other pancytopenia: Secondary | ICD-10-CM | POA: Diagnosis not present

## 2020-08-11 DIAGNOSIS — T451X5D Adverse effect of antineoplastic and immunosuppressive drugs, subsequent encounter: Secondary | ICD-10-CM | POA: Diagnosis not present

## 2020-08-11 DIAGNOSIS — E1140 Type 2 diabetes mellitus with diabetic neuropathy, unspecified: Secondary | ICD-10-CM | POA: Insufficient documentation

## 2020-08-11 DIAGNOSIS — Z87891 Personal history of nicotine dependence: Secondary | ICD-10-CM | POA: Diagnosis not present

## 2020-08-11 DIAGNOSIS — Z90710 Acquired absence of both cervix and uterus: Secondary | ICD-10-CM | POA: Insufficient documentation

## 2020-08-11 DIAGNOSIS — I10 Essential (primary) hypertension: Secondary | ICD-10-CM | POA: Insufficient documentation

## 2020-08-11 DIAGNOSIS — Z79899 Other long term (current) drug therapy: Secondary | ICD-10-CM | POA: Diagnosis not present

## 2020-08-11 DIAGNOSIS — G620 Drug-induced polyneuropathy: Secondary | ICD-10-CM | POA: Insufficient documentation

## 2020-08-11 NOTE — Assessment & Plan Note (Signed)
I expect her peripheral neuropathy will resolve in the near future She does not need treatment and long-term follow-up

## 2020-08-11 NOTE — Progress Notes (Signed)
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 I reviewed CT imaging with the patient which is completely normal I recommend port removal She has appointment to follow-up with GYN surgeon and radiation oncologist We discussed supportive care She does not need long-term follow-up with me  Peripheral neuropathy due to chemotherapy [facility]) I expect her peripheral neuropathy will resolve in the near future She does not need treatment and long-term follow-up  Pancytopenia, acquired [facility]) She has very mild residual pancytopenia from treatment I anticipate this will resolve in a few months I recommend follow-up with primary care doctor in a few months and have her CBC rechecked   Orders Placed This Encounter  Procedures  . IR REMOVAL TUN ACCESS W/ PORT W/O FL MOD SED    Standing Status:   Future    Standing Expiration Date:   08/11/2021    Order Specific Question:   Reason for exam:    Answer:   chemo is completed    Order Specific Question:   Preferred Imaging Location?    Answer:   [facility]    All questions were answered. The patient knows to call the clinic with any problems, questions or concerns. The total time spent in the appointment was 20 minutes encounter with patients including review of chart and various tests results, discussions about plan of care and coordination of care plan   Heath Lark, MD 08/11/2020 12:03 PM  INTERVAL HISTORY: Please see below for problem oriented charting. She returns to review CT imaging results She is doing well Her energy level is fair She has very minimal residual peripheral neuropathy from treatment  SUMMARY OF ONCOLOGIC HISTORY: Oncology History  Overview Note  IHC MMR intact MSI-stable Carcinosarcoma   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[place])  01/01/2020 Imaging   CT A/P: 1. Severe distension of the endometrium highly suspicious for cervical obstruction or stenosis possibly due to a neoplastic process in this postmenopausal female with history of vaginal bleeding. Further evaluation with hysteroscopy is  recommended. 2. Diarrheal state. Correlation with clinical exam and stool cultures recommended. No bowel obstruction. Normal appendix.   01/03/2020 Surgery   D&C A. ENDOMETRIUM, CURETTAGE:  -  Poorly differentiated malignancy  -  See comment  COMMENT:  The material consists of a poorly differentiated malignancy with extensive necrosis.  By immunohistochemistry, p16 is positive and there is focal cytokeratin AE1/3 positivity but negative for cytokeratin 7, cytokeratin 5/6, CD20, CD3, p53, WT-1, Pax-8, and S100. The differential includes poorly differentiated carcinoma and high-grade stromal sarcoma.    02/04/2020 Surgery   Robotic-assisted laparoscopic total hysterectomy with bilateral salpingoophorectomy, SLN biopsy, left pelvic LND   On EUA, 10cm mobile uterus. ON intra-abdominal entry, normal upper abdominal survey. Normal appearing omentum, small and large bowel. Uterus 10cm and bulbous. Tortuous and hyperemic ovarian vasculature. Some tubal metaplasia vs. Metastatic disease. Normal appearing ovaries. Mapping successful to deep right obturator SLN and presacral SLN. NO mapping on left. Enlarge deep obturator lymph on the left. Given high grade histology, short small bowel mesentery, and recent COVID infection, decision made not to proceed with left PA LND (would have likely required open procedure. NO intra-abdominal or pelvic evidence of disease.    02/04/2020 Pathology Results   FINAL MICROSCOPIC DIAGNOSIS:  A. SENTINEL LYMPH NODE, RIGHT OBTURATOR, EXCISION:  -  No carcinoma identified in one lymph node (0/1)  -  See comment   B.  UTERUS AND BILATERAL FALLOPIAN TUBES AND OVARIES, HYSTERECTOMY AND  BILATERAL SALPINGO-OOPHORECTOMY:  -  Carcinosarcoma confined to the uterus and involving less than half of the myometrium  -  See oncology table and comment below   C. LYMPH NODES, LEFT PELVIC, REGIONAL RESECTION:  -  No carcinoma identified in three lymph nodes (0/3)   D. SENTINEL LYMPH NODE, PRESACRAL, EXCISION:  -  No carcinoma identified in one lymph node (0/1)   COMMENT:   A.  Cytokeratin AE1/3 was performed on the sentinel lymph nodes to exclude micrometastasis.  There is no evidence of metastatic carcinoma by immunohistochemistry.   B.  The tumor consists of poorly cohesive epithelioid and rhabdoid cells.  A panel of immunohistochemistry (p16, f, p53, PAX 8, SMA, CD10, CD45, CD 99, CD117, CD138, cytokeratin 7, cyclin D1, cytokeratin AE1/3, EMA and GATA3) is performed to better classify this neoplasm.  By immunohistochemistry, the neoplastic cells are positive for p16, p53 and  PAX 8.  The epithelioid cells are positive for cytokeratin AE1/3 and EMA while the rhabdoid cells show positivity for desmin. Overall, the immunophenotype and morphology supports the diagnosis of carcinosarcoma.    ONCOLOGY TABLE:   UTERUS, CARCINOMA OR CARCINOSARCOMA   Procedure: Total hysterectomy and bilateral salpingo-oophorectomy  Histologic type: Carcinosarcoma  Histologic Grade: FIGO grade 3  Myometrial invasion: Estimated to be less than 50%  Uterine Serosa Involvement: Not identified  Cervical stromal involvement: Not identified  Extent of involvement of other organs: Not identified  Lymphovascular invasion: Not identified  Regional Lymph Nodes:       Examined:       2 Sentinel                               3 non-sentinel                               5 total        Lymph nodes with metastasis: 0        Isolated tumor cells (<0.2 mm): 0        Micrometastasis:  (>0.2 mm and < 2.0 mm): 0        Macrometastasis: (>2.0 mm): 0         Extracapsular extension: N/A  Representative Tumor Block: B1  MMR / MSI testing: Will be ordered  Pathologic Stage Classification (pTNM, AJCC 8th edition):  pT1a, pN0  Comments: See above    02/04/2020 Cancer Staging   Staging form: Corpus Uteri - Carcinoma and Carcinosarcoma, AJCC 8th Edition - Clinical stage from 02/04/2020: FIGO Stage IA (cT1a, cN0(sn), cM0) - Signed by Lafonda Mosses, MD on 02/12/2020   03/06/2020 Imaging   Placement of single lumen port a cath via right internal jugular vein. The catheter tip lies at the cavo-atrial junction. A power injectable port a cath was placed and is ready for immediate use.   03/12/2020 -  Chemotherapy   The patient had carboplatin and taxol for chemotherapy treatment.     08/10/2020 Imaging   No evidence of recurrent or metastatic carcinoma within the abdomen or pelvis.   Aortic Atherosclerosis (ICD10-I70.0).     REVIEW OF SYSTEMS:   Constitutional: Denies fevers, chills or abnormal weight loss Eyes:  Denies blurriness of vision Ears, nose, mouth, throat, and face: Denies mucositis or sore throat Respiratory: Denies cough, dyspnea or wheezes Cardiovascular: Denies palpitation, chest discomfort or lower extremity swelling Gastrointestinal:  Denies nausea, heartburn or change in bowel habits Skin: Denies abnormal skin rashes Lymphatics: Denies new lymphadenopathy or easy bruising Behavioral/Psych: Mood is stable, no new changes  All other systems were reviewed with the patient and are negative.  I have reviewed the past medical history, past surgical history, social history and family history with the patient and they are unchanged from previous note.  ALLERGIES:  has No Known Allergies.  MEDICATIONS:  Current Outpatient Medications  Medication Sig Dispense Refill  . balsalazide (COLAZAL) 750 MG capsule Take 2 capsules (1,500 mg total) by mouth 2 (two) times daily. 360 capsule 1  . calcium carbonate (OS-CAL) 600 MG TABS  tablet Take 600 mg by mouth daily with breakfast.    . carvedilol (COREG) 3.125 MG tablet Take 1 tablet (3.125 mg total) by mouth 2 (two) times daily. 180 tablet 3  . Cholecalciferol (VITAMIN D) 50 MCG (2000 UT) tablet Take 2,000 Units by mouth daily.    . clopidogrel (PLAVIX) 75 MG tablet Take 1 tablet (75 mg total) by mouth daily. 90 tablet 3  . docusate sodium (COLACE) 100 MG capsule Take 100 mg by mouth daily.     Marland Kitchen FLUoxetine (PROZAC) 10 MG capsule Take 10 mg by mouth daily.     Marland Kitchen LORazepam (ATIVAN) 1 MG tablet Take 1 tablet (1 mg total) by mouth every 8 (eight) hours as needed for anxiety (for anxiety). 60 tablet 1  . losartan-hydrochlorothiazide (HYZAAR) 100-12.5 MG tablet Take 1 tablet by mouth daily.    . metFORMIN (GLUCOPHAGE-XR) 500 MG 24 hr tablet Take 500 mg by mouth daily with supper.   5  . Multiple Vitamins-Minerals (MULTIVITAMIN WITH MINERALS) tablet Take 1 tablet by mouth daily.    . nitroGLYCERIN (NITROSTAT) 0.4 MG SL tablet Place 1 tablet (0.4 mg total) under the tongue every 5 (five) minutes as needed for chest pain. 25 tablet 3  . pantoprazole (PROTONIX) 40 MG tablet Take 1 tablet (40 mg total) by mouth daily. Must keep upcoming appt in Nov for future refills 90 tablet 0  . simvastatin (ZOCOR) 40 MG tablet Take 1 tablet (40 mg total) by mouth every evening. 90 tablet 3   No current facility-administered medications for this visit.    PHYSICAL EXAMINATION: ECOG PERFORMANCE STATUS: 1 - Symptomatic but completely ambulatory  Vitals:   08/11/20 1041  BP: 125/61  Pulse: 90  Resp: 18  Temp: 98.9 F (37.2 C)  SpO2: 98%   Filed Weights   08/11/20 1041  Weight: 163 lb 6.4 oz (74.1 kg)    GENERAL:alert, no distress and comfortable NEURO: alert & oriented x 3 with fluent speech, no focal motor/sensory deficits  LABORATORY DATA:  I have reviewed the data as listed    Component Value Date/Time   NA 141 08/10/2020 0945   K 3.6 08/10/2020 0945   CL 102 08/10/2020  0945   CO2 27 08/10/2020 0945   GLUCOSE 120 (H) 08/10/2020 0945   BUN 13 08/10/2020 0945   CREATININE 0.78 08/10/2020 0945   CREATININE 0.84 03/24/2015 0938   CALCIUM 9.5 08/10/2020 0945   PROT 7.2 08/10/2020 0945   PROT 6.5 08/17/2017 0859   ALBUMIN 4.2 08/10/2020 0945   ALBUMIN 4.3 08/17/2017 0859   AST 18 08/10/2020 0945   ALT 9 08/10/2020 0945  ALKPHOS 64 08/10/2020 0945   BILITOT 0.7 08/10/2020 0945   GFRNONAA >60 08/10/2020 0945   GFRAA >60 03/11/2020 1509    No results found for: SPEP, UPEP  Lab Results  Component Value Date   WBC 4.4 08/10/2020   NEUTROABS 2.4 08/10/2020   HGB 10.2 (L) 08/10/2020   HCT 30.0 (L) 08/10/2020   MCV 102.0 (H) 08/10/2020   PLT 134 (L) 08/10/2020      Chemistry      Component Value Date/Time   NA 141 08/10/2020 0945   K 3.6 08/10/2020 0945   CL 102 08/10/2020 0945   CO2 27 08/10/2020 0945   BUN 13 08/10/2020 0945   CREATININE 0.78 08/10/2020 0945   CREATININE 0.84 03/24/2015 0938      Component Value Date/Time   CALCIUM 9.5 08/10/2020 0945   ALKPHOS 64 08/10/2020 0945   AST 18 08/10/2020 0945   ALT 9 08/10/2020 0945   BILITOT 0.7 08/10/2020 0945       RADIOGRAPHIC STUDIES: I have reviewed multiple CT imaging with the patient I have personally reviewed the radiological images as listed and agreed with the findings in the report. CT ABDOMEN PELVIS W CONTRAST  Result Date: 08/10/2020 CLINICAL DATA:  Follow-up uterine carcinosarcoma. Status post hysterectomy, chemotherapy, and radiation therapy. EXAM: CT ABDOMEN AND PELVIS WITH CONTRAST TECHNIQUE: Multidetector CT imaging of the abdomen and pelvis was performed using the standard protocol following bolus administration of intravenous contrast. CONTRAST:  119m OMNIPAQUE IOHEXOL 300 MG/ML  SOLN COMPARISON:  01/01/2020 FINDINGS: Lower Chest: No acute findings. Hepatobiliary: No hepatic masses identified. Gallbladder is unremarkable. No evidence of biliary ductal dilatation.  Pancreas:  No mass or inflammatory changes. Spleen: Within normal limits in size and appearance. Adrenals/Urinary Tract: No masses identified. Stable left renal cysts. No evidence of ureteral calculi or hydronephrosis. Urinary bladder is empty. Stomach/Bowel: No evidence of obstruction, inflammatory process or abnormal fluid collections. Vascular/Lymphatic: No pathologically enlarged lymph nodes. No abdominal aortic aneurysm. Aortic atherosclerotic calcification noted. Reproductive: Prior hysterectomy noted. Adnexal regions are unremarkable in appearance. Other:  None. Musculoskeletal:  No suspicious bone lesions identified. IMPRESSION: No evidence of recurrent or metastatic carcinoma within the abdomen or pelvis. Aortic Atherosclerosis (ICD10-I70.0). Electronically Signed   By: JMarlaine HindM.D.   On: 08/10/2020 13:57

## 2020-08-11 NOTE — Assessment & Plan Note (Signed)
She has completed adjuvant treatment I reviewed CT imaging with the patient which is completely normal I recommend port removal She has appointment to follow-up with GYN surgeon and radiation oncologist We discussed supportive care She does not need long-term follow-up with me

## 2020-08-11 NOTE — Assessment & Plan Note (Signed)
She has very mild residual pancytopenia from treatment I anticipate this will resolve in a few months I recommend follow-up with primary care doctor in a few months and have her CBC rechecked

## 2020-08-12 ENCOUNTER — Other Ambulatory Visit: Payer: Self-pay

## 2020-08-13 ENCOUNTER — Other Ambulatory Visit: Payer: Self-pay | Admitting: Gastroenterology

## 2020-08-13 MED ORDER — PANTOPRAZOLE SODIUM 40 MG PO TBEC: 40.0000 mg | DELAYED_RELEASE_TABLET | Freq: Every day | ORAL | 1 refills | Status: DC

## 2020-08-19 ENCOUNTER — Telehealth: Payer: Self-pay | Admitting: *Deleted

## 2020-08-19 NOTE — Telephone Encounter (Signed)
Returned patient's call and explained that she can drop the forms off anytime and we will fill them out

## 2020-08-21 ENCOUNTER — Other Ambulatory Visit: Payer: Self-pay | Admitting: Radiology

## 2020-08-24 ENCOUNTER — Ambulatory Visit (HOSPITAL_COMMUNITY)
Admission: RE | Admit: 2020-08-24 | Discharge: 2020-08-24 | Disposition: A | Discharge status: Home / Self Care | Payer: PPO | Source: Ambulatory Visit | Attending: Hematology and Oncology | Admitting: Hematology and Oncology

## 2020-08-24 ENCOUNTER — Other Ambulatory Visit: Payer: Self-pay

## 2020-08-24 ENCOUNTER — Encounter (HOSPITAL_COMMUNITY): Payer: Self-pay

## 2020-08-24 DIAGNOSIS — Z7984 Long term (current) use of oral hypoglycemic drugs: Secondary | ICD-10-CM | POA: Diagnosis not present

## 2020-08-24 DIAGNOSIS — Z452 Encounter for adjustment and management of vascular access device: Secondary | ICD-10-CM | POA: Diagnosis not present

## 2020-08-24 DIAGNOSIS — I10 Essential (primary) hypertension: Secondary | ICD-10-CM | POA: Insufficient documentation

## 2020-08-24 DIAGNOSIS — Z87891 Personal history of nicotine dependence: Secondary | ICD-10-CM | POA: Insufficient documentation

## 2020-08-24 DIAGNOSIS — C55 Malignant neoplasm of uterus, part unspecified: Secondary | ICD-10-CM | POA: Diagnosis not present

## 2020-08-24 DIAGNOSIS — I2510 Atherosclerotic heart disease of native coronary artery without angina pectoris: Secondary | ICD-10-CM | POA: Diagnosis not present

## 2020-08-24 DIAGNOSIS — Z8051 Family history of malignant neoplasm of kidney: Secondary | ICD-10-CM | POA: Insufficient documentation

## 2020-08-24 DIAGNOSIS — E119 Type 2 diabetes mellitus without complications: Secondary | ICD-10-CM | POA: Diagnosis not present

## 2020-08-24 DIAGNOSIS — Z7902 Long term (current) use of antithrombotics/antiplatelets: Secondary | ICD-10-CM | POA: Insufficient documentation

## 2020-08-24 DIAGNOSIS — Z801 Family history of malignant neoplasm of trachea, bronchus and lung: Secondary | ICD-10-CM | POA: Diagnosis not present

## 2020-08-24 DIAGNOSIS — Z808 Family history of malignant neoplasm of other organs or systems: Secondary | ICD-10-CM | POA: Insufficient documentation

## 2020-08-24 DIAGNOSIS — Z8542 Personal history of malignant neoplasm of other parts of uterus: Secondary | ICD-10-CM | POA: Diagnosis not present

## 2020-08-24 DIAGNOSIS — Z9221 Personal history of antineoplastic chemotherapy: Secondary | ICD-10-CM | POA: Insufficient documentation

## 2020-08-24 DIAGNOSIS — Z79899 Other long term (current) drug therapy: Secondary | ICD-10-CM | POA: Diagnosis not present

## 2020-08-24 HISTORY — PX: IR REMOVAL TUN ACCESS W/ PORT W/O FL MOD SED: IMG2290

## 2020-08-24 LAB — CBC WITH DIFFERENTIAL/PLATELET
Abs Immature Granulocytes: 0.02 10*3/uL (ref 0.00–0.07)
Basophils Absolute: 0 10*3/uL (ref 0.0–0.1)
Basophils Relative: 0 %
Eosinophils Absolute: 0 10*3/uL (ref 0.0–0.5)
Eosinophils Relative: 1 %
HCT: 30.7 % — ABNORMAL LOW (ref 36.0–46.0)
Hemoglobin: 10.5 g/dL — ABNORMAL LOW (ref 12.0–15.0)
Immature Granulocytes: 1 %
Lymphocytes Relative: 39 %
Lymphs Abs: 1.5 10*3/uL (ref 0.7–4.0)
MCH: 34.5 pg — ABNORMAL HIGH (ref 26.0–34.0)
MCHC: 34.2 g/dL (ref 30.0–36.0)
MCV: 101 fL — ABNORMAL HIGH (ref 80.0–100.0)
Monocytes Absolute: 0.4 10*3/uL (ref 0.1–1.0)
Monocytes Relative: 9 %
Neutro Abs: 2 10*3/uL (ref 1.7–7.7)
Neutrophils Relative %: 50 %
Platelets: 188 10*3/uL (ref 150–400)
RBC: 3.04 MIL/uL — ABNORMAL LOW (ref 3.87–5.11)
RDW: 13.6 % (ref 11.5–15.5)
WBC: 3.9 10*3/uL — ABNORMAL LOW (ref 4.0–10.5)
nRBC: 0 % (ref 0.0–0.2)

## 2020-08-24 LAB — PROTIME-INR
INR: 1 (ref 0.8–1.2)
Prothrombin Time: 13.2 seconds (ref 11.4–15.2)

## 2020-08-24 LAB — GLUCOSE, CAPILLARY: Glucose-Capillary: 108 mg/dL — ABNORMAL HIGH (ref 70–99)

## 2020-08-24 MED ORDER — MIDAZOLAM HCL 2 MG/2ML IJ SOLN
INTRAMUSCULAR | Status: AC | PRN
Start: 2020-08-24 — End: 2020-08-24
  Administered 2020-08-24 (×2): 1 mg via INTRAVENOUS

## 2020-08-24 MED ORDER — FENTANYL CITRATE (PF) 100 MCG/2ML IJ SOLN
INTRAMUSCULAR | Status: AC | PRN
  Administered 2020-08-24 (×2): 50 ug via INTRAVENOUS

## 2020-08-24 MED ORDER — MIDAZOLAM HCL 2 MG/2ML IJ SOLN
INTRAMUSCULAR | Status: AC
  Filled 2020-08-24: qty 2

## 2020-08-24 MED ORDER — LIDOCAINE-EPINEPHRINE 1 %-1:100000 IJ SOLN
INTRAMUSCULAR | Status: AC
  Filled 2020-08-24: qty 1

## 2020-08-24 MED ORDER — LIDOCAINE-EPINEPHRINE (PF) 1 %-1:200000 IJ SOLN
INTRAMUSCULAR | Status: AC | PRN
  Administered 2020-08-24: 10 mL

## 2020-08-24 MED ORDER — SODIUM CHLORIDE 0.9 % IV SOLN: INTRAVENOUS | Status: DC

## 2020-08-24 MED ORDER — FENTANYL CITRATE (PF) 100 MCG/2ML IJ SOLN
INTRAMUSCULAR | Status: AC
  Filled 2020-08-24: qty 2

## 2020-08-24 NOTE — H&P (Signed)
Chief Complaint: Patient was seen in consultation today for port-a-catheter removal  Referring Physician(s): Heath Lark  Supervising Physician: Ruthann Cancer  Patient Status: [facility] - Out-pt  History of Present Illness: [name] [name] is a 75 y.o. female with a medical history significant for DM2, HTN, CAD/MI and uterine cancer status post total hysterectomy and bilateral salpingoophorectomy 02/04/20. She was seen in IR 03/06/20 for port placement for chemotherapy. She has completed her chemotherapy and Interventional Radiology has been asked to evaluate this patient for port removal.   Past Medical History:  Diagnosis Date  . Adenomatous colon polyp 11/2005  . Anginal pain ([place])    last time 2016  . Anxiety   . Arthritis   . CAD (coronary artery disease)    2V CABG 1996 (LIMA to LAD, SVG to RCA) Dr. Servando Snare.; last stress test in 2010  . Diabetes mellitus without complication ([place])    type II  . GERD (gastroesophageal reflux disease)   . Hyperlipidemia   . Hypertension   . Muscle cramps    and pains  . Myocardial infarction ([place])    1996  . Pneumonia    hx of x 2   . Ulcerative colitis   . Uterine cancer [facility])     Past Surgical History:  Procedure Laterality Date  . BACK SURGERY     lumbar fusion 25 years ago and ruptured disk 10 years ago same area  . BACK SURGERY     ruptered disc under lumbar disc  . CARDIAC CATHETERIZATION N/A 10/13/2014   Procedure: Left Heart Cath And Coronary Angiography;  Surgeon: Burnell Blanks, MD;  Location: [place] CV LAB CUPID;  Service: Cardiovascular;  Laterality: N/A;  . CARDIAC CATHETERIZATION N/A 10/13/2014   Procedure: Coronary Stent Intervention;  Surgeon: Burnell Blanks, MD;  Location: [place] INVASIVE CV LAB CUPID;  Service: Cardiovascular;  Laterality: N/A;  . COLONOSCOPY    . CORONARY ANGIOPLASTY WITH STENT PLACEMENT  10/13/2014   DES to [place]  . CORONARY ARTERY BYPASS GRAFT  1996   x 2  . DILATATION &  CURETTAGE/HYSTEROSCOPY WITH MYOSURE N/A 01/03/2020   Procedure: DILATATION & CURETTAGE/HYSTEROSCOPY WITH POSSIBLE MYOSURE;  Surgeon: Paula Compton, MD;  Location: [place];  Service: Gynecology;  Laterality: N/A;  . DILATION AND EVACUATION N/A 01/03/2020   Procedure: DILATATION AND EVACUATION;  Surgeon: Paula Compton, MD;  Location: [place];  Service: Gynecology;  Laterality: N/A;  . IR IMAGING GUIDED PORT INSERTION  03/06/2020  . OPERATIVE ULTRASOUND N/A 01/03/2020   Procedure: OPERATIVE ULTRASOUND;  Surgeon: Paula Compton, MD;  Location: [place];  Service: Gynecology;  Laterality: N/A;  . POLYPECTOMY    . ROBOTIC ASSISTED TOTAL HYSTERECTOMY WITH BILATERAL SALPINGO OOPHERECTOMY Bilateral 02/04/2020   Procedure: XI ROBOTIC ASSISTED TOTAL HYSTERECTOMY WITH BILATERAL SALPINGO OOPHORECTOMY, LYMPH NODE DISSECTION;  Surgeon: Lafonda Mosses, MD;  Location: [facility];  Service: Gynecology;  Laterality: Bilateral;  . SENTINEL NODE BIOPSY N/A 02/04/2020   Procedure: SENTINEL NODE BIOPSY;  Surgeon: Lafonda Mosses, MD;  Location: [facility];  Service: Gynecology;  Laterality: N/A;  . UPPER GASTROINTESTINAL ENDOSCOPY      Allergies: Patient has no known allergies.  Medications: Prior to Admission medications   Medication Sig Start Date End Date Taking? Authorizing Provider  balsalazide (COLAZAL) 750 MG capsule TAKE 2 CAPSULES (1,500 MG TOTAL) BY MOUTH 2 (TWO) TIMES DAILY. 08/13/20   Ladene Artist, MD  calcium carbonate (OS-CAL) 600 MG TABS tablet Take 600 mg by  mouth daily with breakfast.    [provider]  carvedilol (COREG) 3.125 MG tablet Take 1 tablet (3.125 mg total) by mouth 2 (two) times daily. 04/29/20   Burnell Blanks, MD  Cholecalciferol (VITAMIN D) 50 MCG (2000 UT) tablet Take 2,000 Units by mouth daily.    [provider]  clopidogrel (PLAVIX) 75 MG tablet Take 1 tablet (75 mg total) by mouth daily. 04/29/20   Burnell Blanks, MD  docusate sodium  (COLACE) 100 MG capsule Take 100 mg by mouth daily.     [provider]  FLUoxetine (PROZAC) 10 MG capsule Take 10 mg by mouth daily.  12/04/13   [provider]  LORazepam (ATIVAN) 1 MG tablet Take 1 tablet (1 mg total) by mouth every 8 (eight) hours as needed for anxiety (for anxiety). 05/14/20   Heath Lark, MD  losartan-hydrochlorothiazide (HYZAAR) 100-12.5 MG tablet Take 1 tablet by mouth daily. 07/24/19   [provider]  metFORMIN (GLUCOPHAGE-XR) 500 MG 24 hr tablet Take 500 mg by mouth daily with supper.  05/30/17   [provider]  Multiple Vitamins-Minerals (MULTIVITAMIN WITH MINERALS) tablet Take 1 tablet by mouth daily.    [provider]  nitroGLYCERIN (NITROSTAT) 0.4 MG SL tablet Place 1 tablet (0.4 mg total) under the tongue every 5 (five) minutes as needed for chest pain. 04/29/20   Burnell Blanks, MD  pantoprazole (PROTONIX) 40 MG tablet Take 1 tablet (40 mg total) by mouth daily. 08/13/20   Burnell Blanks, MD  simvastatin (ZOCOR) 40 MG tablet Take 1 tablet (40 mg total) by mouth every evening. 04/29/20   Burnell Blanks, MD     Family History  Problem Relation Age of Onset  . Diabetes Mother   . Heart disease Mother   . Heart attack Mother 79  . Hypertension Mother   . Lung cancer Brother   . Colon cancer Brother   . Breast cancer Sister   . Kidney disease Brother        renal cancer  . Heart disease Sister   . Colon cancer Daughter        Thinks her daughter had negative genetic testing  . Hypertension Sister   . Stroke Sister   . Pancreatitis Other   . Breast cancer Daughter   . Stomach cancer Neg Hx     Social History   Socioeconomic History  . Marital status: Widowed    Spouse name: Ray  . Number of children: 7  . Years of education: 52  . Highest education level: Not on file  Occupational History  . Occupation: retired  Tobacco Use  . Smoking status: Former Smoker    Years: 20.00     Quit date: 10/04/1994    Years since quitting: 25.9  . Smokeless tobacco: Never Used  Vaping Use  . Vaping Use: Never used  Substance and Sexual Activity  . Alcohol use: No  . Drug use: No  . Sexual activity: Not Currently    Partners: Male  Other Topics Concern  . Not on file  Social History Narrative  . Not on file   Social Determinants of Health   Financial Resource Strain: Not on file  Food Insecurity: Not on file  Transportation Needs: Not on file  Physical Activity: Not on file  Stress: Not on file  Social Connections: Not on file    Review of Systems: A 12 point ROS discussed and pertinent positives are indicated in the HPI  above.  All other systems are negative.  Review of Systems  Constitutional: Negative for appetite change and fatigue.  Respiratory: Negative for cough and shortness of breath.   Cardiovascular: Negative for chest pain and leg swelling.  Gastrointestinal: Negative for abdominal pain, diarrhea, nausea and vomiting.  Musculoskeletal: Negative for back pain.  Neurological: Negative for dizziness and headaches.    Vital Signs: BP 128/69 (BP Location: Right Arm)   Pulse 85   Temp 98.4 F (36.9 C) (Oral)   Resp 18   SpO2 97%   Physical Exam Constitutional:      General: She is not in acute distress. HENT:     Mouth/Throat:     Mouth: Mucous membranes are moist.     Pharynx: Oropharynx is clear.  Cardiovascular:     Rate and Rhythm: Normal rate and regular rhythm.     Pulses: Normal pulses.     Heart sounds: Normal heart sounds.     Comments: Right upper chest port-a-cathter; not accessed; site unremarkable.  Pulmonary:     Effort: Pulmonary effort is normal.     Breath sounds: Normal breath sounds.  Abdominal:     General: Bowel sounds are normal.     Palpations: Abdomen is soft.  Skin:    General: Skin is warm and dry.  Neurological:     Mental Status: She is alert and oriented to person, place, and time.     Imaging: CT  ABDOMEN PELVIS W CONTRAST  Result Date: 08/10/2020 CLINICAL DATA:  Follow-up uterine carcinosarcoma. Status post hysterectomy, chemotherapy, and radiation therapy. EXAM: CT ABDOMEN AND PELVIS WITH CONTRAST TECHNIQUE: Multidetector CT imaging of the abdomen and pelvis was performed using the standard protocol following bolus administration of intravenous contrast. CONTRAST:  122m OMNIPAQUE IOHEXOL 300 MG/ML  SOLN COMPARISON:  01/01/2020 FINDINGS: Lower Chest: No acute findings. Hepatobiliary: No hepatic masses identified. Gallbladder is unremarkable. No evidence of biliary ductal dilatation. Pancreas:  No mass or inflammatory changes. Spleen: Within normal limits in size and appearance. Adrenals/Urinary Tract: No masses identified. Stable left renal cysts. No evidence of ureteral calculi or hydronephrosis. Urinary bladder is empty. Stomach/Bowel: No evidence of obstruction, inflammatory process or abnormal fluid collections. Vascular/Lymphatic: No pathologically enlarged lymph nodes. No abdominal aortic aneurysm. Aortic atherosclerotic calcification noted. Reproductive: Prior hysterectomy noted. Adnexal regions are unremarkable in appearance. Other:  None. Musculoskeletal:  No suspicious bone lesions identified. IMPRESSION: No evidence of recurrent or metastatic carcinoma within the abdomen or pelvis. Aortic Atherosclerosis (ICD10-I70.0). Electronically Signed   By: JMarlaine HindM.D.   On: 08/10/2020 13:57    Labs:  CBC: Recent Labs    05/14/20 1007 06/08/20 0955 07/13/20 1005 08/10/20 0917  WBC 5.4 6.4 6.0 4.4  HGB 10.8* 10.0* 9.8* 10.2*  HCT 31.4* 28.9* 29.1* 30.0*  PLT 213 209 255 134*    COAGS: Recent Labs    03/06/20 1340  INR 1.0    BMP: Recent Labs    01/20/20 1334 02/04/20 0605 03/06/20 1340 03/11/20 1509 04/02/20 0954 05/14/20 1007 06/08/20 0955 07/13/20 1005 08/10/20 0945  NA 143 137 141 143   < > 142 139 142 141  K 4.2 3.3* 3.5 3.4*   < > 3.7 3.5 3.8 3.6  CL 102  103 102 104   < > 104 101 104 102  CO2 31 24 28 28    < > 25 24 25 27   GLUCOSE 139* 136* 105* 158*   < > 195* 238* 140* 120*  BUN 17 9  12 10   < > 15 13 15 13   CALCIUM 9.3 8.7* 8.9 9.4   < > 9.5 9.2 9.2 9.5  CREATININE 0.99 0.80 0.77 0.82   < > 0.87 1.01* 0.86 0.78  GFRNONAA 57* >60 >60 >60   < > >60 59* >60 >60  GFRAA >60 >60 >60 >60  --   --   --   --   --    < > = values in this interval not displayed.    LIVER FUNCTION TESTS: Recent Labs    05/14/20 1007 06/08/20 0955 07/13/20 1005 08/10/20 0945  BILITOT 0.4 0.8 0.7 0.7  AST 17 15 17 18   ALT 12 10 10 9   ALKPHOS 66 60 60 64  PROT 7.0 6.9 6.8 7.2  ALBUMIN 3.8 3.8 3.9 4.2    TUMOR MARKERS: No results for input(s): AFPTM, CEA, CA199, CHROMGRNA in the last 8760 hours.  Assessment and Plan:  Uterine cancer s/p surgery and chemotherapy: [name] [name], 75 year old female, presents today to the [place] Rad[facility] department for port-a-catheter removal.   Risks and benefits of port-a-catheter removal were discussed with the patient including, but not limited to bleeding and infection.  All of the patient's questions were answered, patient is agreeable to proceed. She has been NPO.   Consent signed and in chart.  Thank you for this interesting consult.  I greatly enjoyed meeting [name] [name] and look forward to participating in their care.  A copy of this report was sent to the requesting provider on this date.  Electronically Signed: Soyla Dryer, AGACNP-BC [id number] 08/24/2020, 10:25 AM   I spent a total of  30 Minutes   in face to face in clinical consultation, greater than 50% of which was counseling/coordinating care for port-a-catheter removal

## 2020-08-24 NOTE — Discharge Instructions (Signed)
For questions /concerns may call Interventional Radiology at [phone]  You may remove your dressing and shower tomorrow afternoon     Implanted Port Removal, Care After This sheet gives you information about how to care for yourself after your procedure. Your health care provider may also give you more specific instructions. If you have problems or questions, contact your health care provider. What can I expect after the procedure? After the procedure, it is common to have:  Soreness or pain near your incision.  Some swelling or bruising near your incision. Follow these instructions at home: Medicines  Take over-the-counter and prescription medicines only as told by your health care provider.  If you were prescribed an antibiotic medicine, take it as told by your health care provider. Do not stop taking the antibiotic even if you start to feel better. Bathing  Do not take baths, swim, or use a hot tub until your health care provider approves. Ask your health care provider if you can take showers. You may only be allowed to take sponge baths. Incision care  Follow instructions from your health care provider about how to take care of your incision. Make sure you: ? Wash your hands with soap and water before you change your bandage (dressing). If soap and water are not available, use hand sanitizer. ? Change your dressing as told by your health care provider. ? Keep your dressing dry. ? Leave stitches, skin glue, or adhesive strips in place. These skin closures may need to stay in place for 2 weeks or longer. If adhesive strip edges start to loosen and curl up, you may trim the loose edges. Do not remove adhesive strips completely unless your health care provider tells you to do that.  Check your incision area every day for signs of infection. Check for: ? More redness, swelling, or pain. ? More fluid or blood. ? Warmth. ? Pus or a bad smell.   Driving  Do not drive for 24  hours if you were given a medicine to help you relax (sedative) during your procedure.  If you did not receive a sedative, ask your health care provider when it is safe to drive.   Activity  Return to your normal activities as told by your health care provider. Ask your health care provider what activities are safe for you.  Do not lift anything that is heavier than 10 lb (4.5 kg), or the limit that you are told, until your health care provider says that it is safe.  Do not do activities that involve lifting your arms over your head. General instructions  Do not use any products that contain nicotine or tobacco, such as cigarettes and e-cigarettes. These can delay healing. If you need help quitting, ask your health care provider.  Keep all follow-up visits as told by your health care provider. This is important. Contact a health care provider if:  You have more redness, swelling, or pain around your incision.  You have more fluid or blood coming from your incision.  Your incision feels warm to the touch.  You have pus or a bad smell coming from your incision.  You have pain that is not relieved by your pain medicine. Get help right away if you have:  A fever or chills.  Chest pain.  Difficulty breathing. Summary  After the procedure, it is common to have pain, soreness, swelling, or bruising near your incision.  If you were prescribed an antibiotic medicine, take it as told by  your health care provider. Do not stop taking the antibiotic even if you start to feel better.  Do not drive for 24 hours if you were given a sedative during your procedure.  Return to your normal activities as told by your health care provider. Ask your health care provider what activities are safe for you. This information is not intended to replace advice given to you by your health care provider. Make sure you discuss any questions you have with your health care provider.   Moderate Conscious  Sedation, Adult, Care After This sheet gives you information about how to care for yourself after your procedure. Your health care provider may also give you more specific instructions. If you have problems or questions, contact your health care provider. What can I expect after the procedure? After the procedure, it is common to have:  Sleepiness for several hours.  Impaired judgment for several hours.  Difficulty with balance.  Vomiting if you eat too soon. Follow these instructions at home: For the time period you were told by your health care provider:  Rest.  Do not participate in activities where you could fall or become injured.  Do not drive or use machinery.  Do not drink alcohol.  Do not take sleeping pills or medicines that cause drowsiness.  Do not make important decisions or sign legal documents.  Do not take care of children on your own.      Eating and drinking  Follow the diet recommended by your health care provider.  Drink enough fluid to keep your urine pale yellow.  If you vomit: ? Drink water, juice, or soup when you can drink without vomiting. ? Make sure you have little or no nausea before eating solid foods.   General instructions  Take over-the-counter and prescription medicines only as told by your health care provider.  Have a responsible adult stay with you for the time you are told. It is important to have someone help care for you until you are awake and alert.  Do not smoke.  Keep all follow-up visits as told by your health care provider. This is important. Contact a health care provider if:  You are still sleepy or having trouble with balance after 24 hours.  You feel light-headed.  You keep feeling nauseous or you keep vomiting.  You develop a rash.  You have a fever.  You have redness or swelling around the IV site. Get help right away if:  You have trouble breathing.  You have new-onset confusion at  home. Summary  After the procedure, it is common to feel sleepy, have impaired judgment, or feel nauseous if you eat too soon.  Rest after you get home. Know the things you should not do after the procedure.  Follow the diet recommended by your health care provider and drink enough fluid to keep your urine pale yellow.  Get help right away if you have trouble breathing or new-onset confusion at home. This information is not intended to replace advice given to you by your health care provider. Make sure you discuss any questions you have with your health care provider. Document Revised: 09/27/2019 Document Reviewed: 04/25/2019 Elsevier Patient Education  2021 [employer].

## 2020-08-24 NOTE — Procedures (Signed)
Interventional Radiology Procedure Note  Procedure: Port removal  Findings: Please refer to procedural dictation for full description.  Complications: None immediate  Estimated Blood Loss: < 5 mL  Recommendations: Local wound care.   Ruthann Cancer, MD

## 2020-09-04 ENCOUNTER — Encounter: Payer: Self-pay | Admitting: Gynecologic Oncology

## 2020-09-07 ENCOUNTER — Inpatient Hospital Stay (HOSPITAL_BASED_OUTPATIENT_CLINIC_OR_DEPARTMENT_OTHER): Payer: PPO | Admitting: Gynecologic Oncology

## 2020-09-07 ENCOUNTER — Other Ambulatory Visit: Payer: Self-pay

## 2020-09-07 VITALS — BP 115/61 | HR 89 | Temp 97.7°F | Resp 18 | Ht 66.0 in | Wt 163.0 lb

## 2020-09-07 DIAGNOSIS — C55 Malignant neoplasm of uterus, part unspecified: Secondary | ICD-10-CM

## 2020-09-07 NOTE — Progress Notes (Signed)
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 Severe distension of the endometrium highly suspicious for cervical obstruction or stenosis possibly due to a neoplastic process in this postmenopausal female with history of vaginal bleeding. Further evaluation with hysteroscopy is  recommended. 2. Diarrheal state. Correlation with clinical exam and stool cultures recommended. No bowel obstruction. Normal appendix.   01/03/2020 Surgery   D&C A. ENDOMETRIUM, CURETTAGE:  -  Poorly differentiated malignancy  -  See comment  COMMENT:  The material consists of a poorly differentiated malignancy with extensive necrosis.  By immunohistochemistry, p16 is positive and there is focal cytokeratin AE1/3 positivity but negative for cytokeratin 7, cytokeratin 5/6, CD20, CD3, p53, WT-1, Pax-8, and S100. The differential includes poorly differentiated carcinoma and high-grade stromal sarcoma.    02/04/2020 Surgery   Robotic-assisted laparoscopic total hysterectomy with bilateral salpingoophorectomy, SLN biopsy, left pelvic LND   On EUA, 10cm mobile uterus. ON intra-abdominal entry, normal upper abdominal survey. Normal appearing omentum, small and large bowel. Uterus 10cm and bulbous. Tortuous and hyperemic ovarian vasculature. Some tubal metaplasia vs. Metastatic disease. Normal appearing ovaries. Mapping successful to deep right obturator SLN and presacral SLN. NO mapping on left. Enlarge deep obturator lymph on the left. Given high grade histology, short small bowel mesentery, and recent COVID infection, decision made not to proceed with left PA LND (would have likely required open procedure. NO intra-abdominal or pelvic  evidence of disease.    02/04/2020 Pathology Results   FINAL MICROSCOPIC DIAGNOSIS:   A. SENTINEL LYMPH NODE, RIGHT OBTURATOR, EXCISION:  -  No carcinoma identified in one lymph node (0/1)  -  See comment   B. UTERUS AND BILATERAL FALLOPIAN TUBES AND OVARIES, HYSTERECTOMY AND  BILATERAL SALPINGO-OOPHORECTOMY:  -  Carcinosarcoma confined to the uterus and involving less than half of the myometrium  -  See oncology table and comment below   C. LYMPH NODES, LEFT PELVIC, REGIONAL RESECTION:  -  No carcinoma identified in three lymph nodes (0/3)   D. SENTINEL LYMPH NODE, PRESACRAL, EXCISION:  -  No carcinoma identified in one lymph node (0/1)   COMMENT:   A.  Cytokeratin AE1/3 was performed on the sentinel lymph nodes to exclude micrometastasis.  There is no evidence of metastatic carcinoma by immunohistochemistry.   B.  The tumor consists of poorly cohesive epithelioid and rhabdoid cells.  A panel of immunohistochemistry (p16, f, p53, PAX 8, SMA, CD10, CD45, CD 99, CD117, CD138, cytokeratin 7, cyclin D1, cytokeratin AE1/3, EMA and GATA3) is performed to better classify this neoplasm.  By immunohistochemistry, the neoplastic cells are positive for p16, p53 and  PAX 8.  The epithelioid cells are positive for cytokeratin AE1/3 and EMA while the rhabdoid cells show positivity for desmin. Overall, the immunophenotype and morphology supports the diagnosis of carcinosarcoma.    ONCOLOGY TABLE:   UTERUS, CARCINOMA OR CARCINOSARCOMA   Procedure: Total hysterectomy and bilateral salpingo-oophorectomy  Histologic type: Carcinosarcoma  Histologic Grade: FIGO grade 3  Myometrial invasion: Estimated to be less than 50%  Uterine Serosa Involvement: Not identified  Cervical stromal involvement: Not identified  Extent of involvement of other organs: Not identified  Lymphovascular invasion: Not identified  Regional Lymph Nodes:       Examined:  2 Sentinel                               3  non-sentinel                               5 total        Lymph nodes with metastasis: 0        Isolated tumor cells (<0.2 mm): 0        Micrometastasis:  (>0.2 mm and < 2.0 mm): 0        Macrometastasis: (>2.0 mm): 0        Extracapsular extension: N/A  Representative Tumor Block: B1  MMR / MSI testing: Will be ordered  Pathologic Stage Classification (pTNM, AJCC 8th edition):  pT1a, pN0  Comments: See above    02/04/2020 Cancer Staging   Staging form: Corpus Uteri - Carcinoma and Carcinosarcoma, AJCC 8th Edition - Clinical stage from 02/04/2020: FIGO Stage IA (cT1a, cN0(sn), cM0) - Signed by Lafonda Mosses, MD on 02/12/2020   03/06/2020 Imaging   Placement of single lumen port a cath via right internal jugular vein. The catheter tip lies at the cavo-atrial junction. A power injectable port a cath was placed and is ready for immediate use.   03/12/2020 -  Chemotherapy   The patient had carboplatin and taxol for chemotherapy treatment.     04/16/2020 - 05/11/2020 Radiation Therapy   04/16/2020 through 05/11/2020 Site Technique Total Dose (Gy) Dose per Fx (Gy) Completed Fx Beam Energies  Vagina: Pelvis HDR-brachy 30/30 6 5/5 Ir-192       08/10/2020 Imaging   No evidence of recurrent or metastatic carcinoma within the abdomen or pelvis.   Aortic Atherosclerosis (ICD10-I70.0).   08/24/2020 Procedure   Successful removal of implanted Port-A-Cath.     Interval History: Patient presents today after finishing adjuvant treatment at the end of the year.  She received adjuvant carboplatin and paclitaxel as well as vaginal brachytherapy.  She reports doing well since finishing treatment.  She continues to have some intermittent fatigue, dizziness, and neuropathy in her fingers and toes.  Overall, the symptoms are slowly improving since she stopped treatment.  She also injured dorsa some anxiety.  She notes having a good appetite without nausea or emesis.  She reports normal bowel and bladder  function.  She had some mild vaginal spotting the first couple time she used her vaginal dilator but otherwise denies any vaginal bleeding or discharge.  Past Medical/Surgical History: Past Medical History:  Diagnosis Date  . Adenomatous colon polyp 11/2005  . Anginal pain ([place])    last time 2016  . Anxiety   . Arthritis   . CAD (coronary artery disease)    2V CABG 1996 (LIMA to LAD, SVG to RCA) Dr. Servando Snare.; last stress test in 2010  . Diabetes mellitus without complication ([place])    type II  . GERD (gastroesophageal reflux disease)   . Hyperlipidemia   . Hypertension   . Muscle cramps    and pains  . Myocardial infarction ([place])    1996  . Pneumonia    hx of x 2   . Ulcerative colitis   . Uterine cancer [facility])     Past Surgical History:  Procedure Laterality Date  . BACK SURGERY     lumbar fusion 25 years ago and ruptured disk 10 years ago same  area  . BACK SURGERY     ruptered disc under lumbar disc  . CARDIAC CATHETERIZATION N/A 10/13/2014   Procedure: Left Heart Cath And Coronary Angiography;  Surgeon: Burnell Blanks, MD;  Location: [facility];  Service: Cardiovascular;  Laterality: N/A;  . CARDIAC CATHETERIZATION N/A 10/13/2014   Procedure: Coronary Stent Intervention;  Surgeon: Burnell Blanks, MD;  Location: [facility];  Service: Cardiovascular;  Laterality: N/A;  . COLONOSCOPY    . CORONARY ANGIOPLASTY WITH STENT PLACEMENT  10/13/2014   DES to [place]  . CORONARY ARTERY BYPASS GRAFT  1996   x 2  . DILATATION & CURETTAGE/HYSTEROSCOPY WITH MYOSURE N/A 01/03/2020   Procedure: DILATATION & CURETTAGE/HYSTEROSCOPY WITH POSSIBLE MYOSURE;  Surgeon: Paula Compton, MD;  Location: [facility];  Service: Gynecology;  Laterality: N/A;  . DILATION AND EVACUATION N/A 01/03/2020   Procedure: DILATATION AND EVACUATION;  Surgeon: Paula Compton, MD;  Location: [facility];  Service: Gynecology;  Laterality: N/A;  . IR IMAGING GUIDED PORT  INSERTION  03/06/2020  . IR REMOVAL TUN ACCESS W/ PORT W/O FL MOD SED  08/24/2020  . OPERATIVE ULTRASOUND N/A 01/03/2020   Procedure: OPERATIVE ULTRASOUND;  Surgeon: Paula Compton, MD;  Location: [facility];  Service: Gynecology;  Laterality: N/A;  . POLYPECTOMY    . ROBOTIC ASSISTED TOTAL HYSTERECTOMY WITH BILATERAL SALPINGO OOPHERECTOMY Bilateral 02/04/2020   Procedure: XI ROBOTIC ASSISTED TOTAL HYSTERECTOMY WITH BILATERAL SALPINGO OOPHORECTOMY, LYMPH NODE DISSECTION;  Surgeon: Lafonda Mosses, MD;  Location: [facility];  Service: Gynecology;  Laterality: Bilateral;  . SENTINEL NODE BIOPSY N/A 02/04/2020   Procedure: SENTINEL NODE BIOPSY;  Surgeon: Lafonda Mosses, MD;  Location: [facility];  Service: Gynecology;  Laterality: N/A;  . UPPER GASTROINTESTINAL ENDOSCOPY      Family History  Problem Relation Age of Onset  . Diabetes Mother   . Heart disease Mother   . Heart attack Mother 67  . Hypertension Mother   . Lung cancer Brother   . Colon cancer Brother   . Breast cancer Sister   . Kidney disease Brother        renal cancer  . Heart disease Sister   . Colon cancer Daughter        Thinks her daughter had negative genetic testing  . Hypertension Sister   . Stroke Sister   . Pancreatitis Other   . Breast cancer Daughter   . Stomach cancer Neg Hx     Social History   Socioeconomic History  . Marital status: Widowed    Spouse name: Ray  . Number of children: 7  . Years of education: 28  . Highest education level: Not on file  Occupational History  . Occupation: retired  Tobacco Use  . Smoking status: Former Smoker    Years: 20.00    Quit date: 10/04/1994    Years since quitting: 25.9  . Smokeless tobacco: Never Used  Vaping Use  . Vaping Use: Never used  Substance and Sexual Activity  . Alcohol use: No  . Drug use: No  . Sexual activity: Not Currently    Partners: Male  Other Topics Concern  . Not on file  Social History Narrative  . Not on file   Social  Determinants of Health   Financial Resource Strain: Not on file  Food Insecurity: Not on file  Transportation Needs: Not on file  Physical Activity: Not on file  Stress: Not on file  Social Connections: Not on  file    Current Medications:  Current Outpatient Medications:  .  balsalazide (COLAZAL) 750 MG capsule, TAKE 2 CAPSULES (1,500 MG TOTAL) BY MOUTH 2 (TWO) TIMES DAILY., Disp: 360 capsule, Rfl: 1 .  calcium carbonate (OS-CAL) 600 MG TABS tablet, Take 600 mg by mouth daily with breakfast., Disp: , Rfl:  .  carvedilol (COREG) 3.125 MG tablet, Take 1 tablet (3.125 mg total) by mouth 2 (two) times daily., Disp: 180 tablet, Rfl: 3 .  Cholecalciferol (VITAMIN D) 50 MCG (2000 UT) tablet, Take 2,000 Units by mouth daily., Disp: , Rfl:  .  clopidogrel (PLAVIX) 75 MG tablet, Take 1 tablet (75 mg total) by mouth daily., Disp: 90 tablet, Rfl: 3 .  docusate sodium (COLACE) 100 MG capsule, Take 100 mg by mouth daily. , Disp: , Rfl:  .  FLUoxetine (PROZAC) 10 MG capsule, Take 10 mg by mouth daily. , Disp: , Rfl:  .  LORazepam (ATIVAN) 1 MG tablet, Take 1 tablet (1 mg total) by mouth every 8 (eight) hours as needed for anxiety (for anxiety)., Disp: 60 tablet, Rfl: 1 .  losartan-hydrochlorothiazide (HYZAAR) 100-12.5 MG tablet, Take 1 tablet by mouth daily., Disp: , Rfl:  .  metFORMIN (GLUCOPHAGE-XR) 500 MG 24 hr tablet, Take 500 mg by mouth daily with supper. , Disp: , Rfl: 5 .  Multiple Vitamins-Minerals (MULTIVITAMIN WITH MINERALS) tablet, Take 1 tablet by mouth daily., Disp: , Rfl:  .  nitroGLYCERIN (NITROSTAT) 0.4 MG SL tablet, Place 1 tablet (0.4 mg total) under the tongue every 5 (five) minutes as needed for chest pain., Disp: 25 tablet, Rfl: 3 .  pantoprazole (PROTONIX) 40 MG tablet, Take 1 tablet (40 mg total) by mouth daily., Disp: 90 tablet, Rfl: 1 .  simvastatin (ZOCOR) 40 MG tablet, Take 1 tablet (40 mg total) by mouth every evening., Disp: 90 tablet, Rfl: 3  Review of Systems: Pertinent  positives as per HPI Denies appetite changes, fevers, chills, unexplained weight changes. Denies hearing loss, neck lumps or masses, mouth sores, ringing in ears or voice changes. Denies cough or wheezing.  Denies shortness of breath. Denies chest pain or palpitations. Denies leg swelling. Denies abdominal distention, pain, blood in stools, constipation, diarrhea, nausea, vomiting, or early satiety. Denies pain with intercourse, dysuria, frequency, hematuria or incontinence. Denies hot flashes, pelvic pain, vaginal bleeding or vaginal discharge.   Denies joint pain, back pain or muscle pain/cramps. Denies itching, rash, or wounds. Denies headaches or seizures. Denies swollen lymph nodes or glands, denies easy bruising or bleeding. Denies depression, confusion, or decreased concentration.  Physical Exam: BP 115/61 (BP Location: Left Arm, Patient Position: Sitting)   Pulse 89   Temp 97.7 F (36.5 C) (Tympanic)   Resp 18   Ht _0  (1.676 m)   Wt 163 lb (73.9 kg)   SpO2 99%   BMI 26.31 kg/m  General: Alert, oriented, no acute distress. HEENT: Normocephalic, atraumatic, sclera anicteric. Chest: Unlabored breathing on room air.  Port site removal clean dry and intact. Abdomen: soft, nontender.  Normoactive bowel sounds.  No masses or hepatosplenomegaly appreciated.  Well-healed laparoscopic incisions. Extremities: Grossly normal range of motion.  Warm, well perfused.  No edema bilaterally. Skin: No rashes or lesions noted. Lymphatics: No cervical, supraclavicular, or inguinal adenopathy. GU: Normal appearing external genitalia without erythema, excoriation, or lesions.  Speculum exam reveals moderately atrophic vaginal mucosa, minimal spotting noted at the apex with significant radiation changes and hypopigmentation of the tissue.  No discrete masses or lesions noted.  Bimanual exam reveals cuff intact, smooth and no nodularity appreciated.  Rectovaginal exam confirms  findings.  Laboratory & Radiologic Studies: None new  Assessment & Plan: [name] [name] is a 75 y.o. woman with stage Ia carcinosarcoma who resents for surveillance visit after completion of adjuvant therapy at the end of 2021.  Patient is overall doing well and is NED on exam today.  She continues to have some symptoms related to adjuvant treatment but these are overall improving.  We discussed signs and symptoms that would be concerning for disease recurrence, and she knows to call if she develops any of these sooner than her next scheduled visit.  Per NCCN recommendations in the setting of her high risk histology, we will continue with visits every 3 months for the first 2 years.  She is scheduled to see radiation oncology in 3 months and I will see her back in September.  She knows to call the clinic in the summer to schedule that visit.  She was encouraged to continue using her vaginal dilator to prevent vaginal agglutination after radiation.  Patient was given a survivorship packet today which I reviewed with her including her treatment summary as well as survivorship materials.  28 minutes of total time was spent for this patient encounter, including preparation, face-to-face counseling with the patient and coordination of care, and documentation of the encounter.  Jeral Pinch, MD  Division of Gynecologic Oncology  Department of Obstetrics and Gynecology  [facility]

## 2020-09-07 NOTE — Patient Instructions (Signed)
It was great to see you!  I do not see anything concerning on your exam today.  Keep using the vaginal dilator several times a week to help keep the vaginal tissue open.  I will see you 3 months after your visit with radiation oncology, in late September.  If you develop any new symptoms, like vaginal bleeding, change to your bowel function, or with weight loss before then, please call the clinic to see me sooner at [phone].

## 2020-10-20 DIAGNOSIS — F419 Anxiety disorder, unspecified: Secondary | ICD-10-CM | POA: Diagnosis not present

## 2020-10-20 DIAGNOSIS — E785 Hyperlipidemia, unspecified: Secondary | ICD-10-CM | POA: Diagnosis not present

## 2020-10-20 DIAGNOSIS — F3342 Major depressive disorder, recurrent, in full remission: Secondary | ICD-10-CM | POA: Diagnosis not present

## 2020-10-20 DIAGNOSIS — E1169 Type 2 diabetes mellitus with other specified complication: Secondary | ICD-10-CM | POA: Diagnosis not present

## 2020-10-20 DIAGNOSIS — I2510 Atherosclerotic heart disease of native coronary artery without angina pectoris: Secondary | ICD-10-CM | POA: Diagnosis not present

## 2020-10-20 DIAGNOSIS — I10 Essential (primary) hypertension: Secondary | ICD-10-CM | POA: Diagnosis not present

## 2020-10-20 DIAGNOSIS — Z7984 Long term (current) use of oral hypoglycemic drugs: Secondary | ICD-10-CM | POA: Diagnosis not present

## 2020-11-03 DIAGNOSIS — H52223 Regular astigmatism, bilateral: Secondary | ICD-10-CM | POA: Diagnosis not present

## 2020-11-03 DIAGNOSIS — H524 Presbyopia: Secondary | ICD-10-CM | POA: Diagnosis not present

## 2020-11-03 DIAGNOSIS — I10 Essential (primary) hypertension: Secondary | ICD-10-CM | POA: Diagnosis not present

## 2020-11-03 DIAGNOSIS — H04203 Unspecified epiphora, bilateral lacrimal glands: Secondary | ICD-10-CM | POA: Diagnosis not present

## 2020-11-03 DIAGNOSIS — H35033 Hypertensive retinopathy, bilateral: Secondary | ICD-10-CM | POA: Diagnosis not present

## 2020-11-03 DIAGNOSIS — H2513 Age-related nuclear cataract, bilateral: Secondary | ICD-10-CM | POA: Diagnosis not present

## 2020-11-03 DIAGNOSIS — H5203 Hypermetropia, bilateral: Secondary | ICD-10-CM | POA: Diagnosis not present

## 2020-11-03 DIAGNOSIS — E119 Type 2 diabetes mellitus without complications: Secondary | ICD-10-CM | POA: Diagnosis not present

## 2020-12-02 ENCOUNTER — Encounter: Payer: Self-pay | Admitting: Radiation Oncology

## 2020-12-09 NOTE — Progress Notes (Signed)
Radiation Oncology         [phone] ________________________________  Name: [name] [name] MRN: [id number]  Date: 12/10/2020  DOB: [birth year]  Follow-Up Visit Note  CC: [name], MD  [name], MD    ICD-10-CM   1. 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  Since she was last seen, the patient received a CT of the abdomen and pelvis on 08/10/20 which showed no evidence of recurrent or metastatic carcinoma.       The patient most recently met with Dr. Berline Lopes 09/07/20 for a follow-up visit. During the visit, the patient reports doing well since finishing treatment of adjuvant radiation at the end of last year.  She did report continued intermittent fatigue, dizziness, and neuropathy in her fingers and toes; however the symptoms are slowly improving since she stopped treatment. She additionally denied any vaginal bleeding or discharge at the time of this visit.  Of note: the patient had her port-a-cath successfully removed on 08/24/20.        She denies any vaginal bleeding pelvic pain hematuria or rectal bleeding.  She denies any abdominal bloating.  She does have some urinary urgency but no dysuria.  She does report pain in the right groin area when she stands.  This clears up when she is sitting or lying down.                  Allergies:  has No Known Allergies.  Meds: Current Outpatient Medications  Medication Sig Dispense Refill   balsalazide (COLAZAL) 750 MG capsule TAKE 2 CAPSULES (1,500 MG TOTAL) BY MOUTH 2 (TWO) TIMES DAILY. 360 capsule 1   calcium carbonate (OS-CAL) 600 MG TABS tablet Take 600 mg by  mouth daily with breakfast.     carvedilol (COREG) 3.125 MG tablet Take 1 tablet (3.125 mg total) by mouth 2 (two) times daily. 180 tablet 3   Cholecalciferol (VITAMIN D) 50 MCG (2000 UT) tablet Take 2,000 Units by mouth daily.     clopidogrel (PLAVIX) 75 MG tablet Take 1 tablet (75 mg total) by mouth daily. 90 tablet 3   FLUoxetine (PROZAC) 10 MG capsule Take 10 mg by mouth daily.      LORazepam (ATIVAN) 1 MG tablet Take 1 tablet (1 mg total) by mouth every 8 (eight) hours as needed for anxiety (for anxiety). 60 tablet 1   metFORMIN (GLUCOPHAGE-XR) 500 MG 24 hr tablet Take 500 mg by mouth daily with supper.   5   Multiple Vitamins-Minerals (MULTIVITAMIN WITH MINERALS) tablet Take 1 tablet by mouth daily.     nitroGLYCERIN (NITROSTAT) 0.4 MG SL tablet Place 1 tablet (0.4 mg total) under the tongue every 5 (five) minutes as needed for chest pain. 25 tablet 3   pantoprazole (PROTONIX) 40 MG tablet Take 1 tablet (40 mg total) by mouth daily. 90 tablet 1   simvastatin (ZOCOR) 40 MG tablet Take 1 tablet (40 mg total) by mouth every evening. 90 tablet 3   docusate sodium (COLACE) 100 MG capsule Take 100 mg by mouth daily.  (Patient not taking: Reported on 12/10/2020)  losartan-hydrochlorothiazide (HYZAAR) 100-12.5 MG tablet Take 1 tablet by mouth daily. (Patient not taking: Reported on 12/10/2020)     No current facility-administered medications for this encounter.    Physical Findings: The patient is in no acute distress. Patient is alert and oriented.  height is _0  (1.676 m) and weight is 169 lb 4 oz (76.8 kg). Her temperature is 97.9 F (36.6 C). Her blood pressure is 120/54 (abnormal) and her pulse is 79. Her respiration is 18 and oxygen saturation is 98%. .  No significant changes. Lungs are clear to auscultation bilaterally. Heart has regular rate and rhythm. No palpable cervical, supraclavicular, or axillary adenopathy. Abdomen soft, non-tender, normal bowel sounds.  Palpation along the  inguinal areas reveals no suspicious adenopathy.  No palpable area in the right groin to explain her pain.  No evidence of hernia when standing and coughing.  On pelvic examination the external genitalia are unremarkable.  A speculum exam was performed.  No mucosal lesions noted in the vaginal vault. .  On bimanual examination no pelvic masses appreciated.  Vaginal cuff is intact.  Rectovaginal exam confirms these findings.  Rectal sphincter tone normal.   Lab Findings: Lab Results  Component Value Date   WBC 3.9 (L) 08/24/2020   HGB 10.5 (L) 08/24/2020   HCT 30.7 (L) 08/24/2020   MCV 101.0 (H) 08/24/2020   PLT 188 08/24/2020    Radiographic Findings: No results found.  Impression: Early stage carcinosarcoma  No evidence of recurrence on clinical exam today.  Patient continues to use her vaginal dilator 3 times a week as recommended.  Plan: She will follow up with Dr. Berline Lopes in 3 months and radiation oncology in 6 months.  The patient mentioned that she may be moving to [place] to live near her daughter she however would like to continue her follow-ups here in [place] concerning her carcinosarcoma.   20 minutes of total time was spent for this patient encounter, including preparation, face-to-face counseling with the patient and coordination of care, physical exam, and documentation of the encounter. ____________________________________  Blair Promise, PhD, MD   This document serves as a record of services personally performed by Gery Pray, MD. It was created on his behalf by Roney Mans, a trained medical scribe. The creation of this record is based on the scribe's personal observations and the provider's statements to them. This document has been checked and approved by the attending provider.

## 2020-12-10 ENCOUNTER — Ambulatory Visit
Admission: RE | Admit: 2020-12-10 | Discharge: 2020-12-10 | Disposition: A | Discharge status: Home / Self Care | Payer: PPO | Source: Ambulatory Visit | Attending: Radiation Oncology | Admitting: Radiation Oncology

## 2020-12-10 ENCOUNTER — Other Ambulatory Visit: Payer: Self-pay

## 2020-12-10 ENCOUNTER — Telehealth: Payer: Self-pay | Admitting: *Deleted

## 2020-12-10 ENCOUNTER — Encounter: Payer: Self-pay | Admitting: Radiation Oncology

## 2020-12-10 VITALS — BP 120/54 | HR 79 | Temp 97.9°F | Resp 18 | Ht 66.0 in | Wt 169.2 lb

## 2020-12-10 DIAGNOSIS — Z7984 Long term (current) use of oral hypoglycemic drugs: Secondary | ICD-10-CM | POA: Diagnosis not present

## 2020-12-10 DIAGNOSIS — Z08 Encounter for follow-up examination after completed treatment for malignant neoplasm: Secondary | ICD-10-CM | POA: Diagnosis not present

## 2020-12-10 DIAGNOSIS — Z8542 Personal history of malignant neoplasm of other parts of uterus: Secondary | ICD-10-CM | POA: Diagnosis not present

## 2020-12-10 DIAGNOSIS — Z79899 Other long term (current) drug therapy: Secondary | ICD-10-CM | POA: Insufficient documentation

## 2020-12-10 DIAGNOSIS — C55 Malignant neoplasm of uterus, part unspecified: Secondary | ICD-10-CM

## 2020-12-10 HISTORY — DX: Personal history of irradiation: Z92.3

## 2020-12-10 NOTE — Progress Notes (Signed)
[name] [name] is here today for follow up post radiation to the pelvic.  They completed their radiation on: 05/11/20  Does the patient complain of any of the following:  Pain:Patient reports having pain to right groin area. Patient reports groin hurts when she stands, rating at 5/10.  Abdominal bloating: no Diarrhea/Constipation: no Nausea/Vomiting: no Vaginal Discharge: no Blood in Urine or Stool: no Urinary Issues (dysuria/incomplete emptying/ incontinence/ increased frequency/urgency): Urinary urgency.  Does patient report using vaginal dilator 2-3 times a week and/or sexually active 2-3 weeks: Patient  reports using dilator 3 times per week.  Post radiation skin changes: no   Additional comments if applicable:  Vitals:   53/96/72 1139  BP: (!) 120/54  Pulse: 79  Resp: 18  Temp: 97.9 F (36.6 C)  SpO2: 98%  Weight: 169 lb 4 oz (76.8 kg)  Height: 5' 6"  (1.676 m)

## 2020-12-10 NOTE — Telephone Encounter (Signed)
Called patient to inform of fu with Dr. Berline Lopes on 03-12-21- arrival time- 1:00 pm, spoke with patient and she is aware of this appt.

## 2020-12-18 DIAGNOSIS — Z1231 Encounter for screening mammogram for malignant neoplasm of breast: Secondary | ICD-10-CM | POA: Diagnosis not present

## 2021-01-06 ENCOUNTER — Other Ambulatory Visit: Payer: Self-pay | Admitting: Cardiovascular Disease

## 2021-02-21 ENCOUNTER — Other Ambulatory Visit: Payer: Self-pay | Admitting: Gastroenterology

## 2021-02-23 ENCOUNTER — Telehealth: Payer: Self-pay | Admitting: Cardiovascular Disease

## 2021-02-23 MED ORDER — SIMVASTATIN 40 MG PO TABS: 40.0000 mg | ORAL_TABLET | Freq: Every evening | ORAL | 1 refills | Status: DC

## 2021-02-23 MED ORDER — PANTOPRAZOLE SODIUM 40 MG PO TBEC
40.0000 mg | DELAYED_RELEASE_TABLET | Freq: Every day | ORAL | 1 refills | Status: AC
Start: 2021-02-23 — End: ?

## 2021-02-23 MED ORDER — NITROGLYCERIN 0.4 MG SL SUBL: 0.4000 mg | SUBLINGUAL_TABLET | SUBLINGUAL | 3 refills | Status: DC | PRN

## 2021-02-23 MED ORDER — CLOPIDOGREL BISULFATE 75 MG PO TABS: 75.0000 mg | ORAL_TABLET | Freq: Every day | ORAL | 1 refills | Status: DC

## 2021-02-23 MED ORDER — CARVEDILOL 3.125 MG PO TABS: 3.1250 mg | ORAL_TABLET | Freq: Two times a day (BID) | ORAL | 1 refills | Status: DC

## 2021-02-23 NOTE — Telephone Encounter (Signed)
*  STAT* If patient is at the pharmacy, call can be transferred to refill team.   1. Which medications need to be refilled? (please list name of each medication and dose if known) carvedilol (COREG) 3.125 MG tablet, clopidogrel (PLAVIX) 75 MG tablet, nitroGLYCERIN (NITROSTAT) 0.4 MG SL tablet, pantoprazole (PROTONIX) 40 MG tablet, simvastatin (ZOCOR) 40 MG tablet   2. Which pharmacy/location (including street and city if local pharmacy) is medication to be sent to?   [facility] in [place], [place], [place] [address]  [phone]  3. Do they need a 30 day or 90 day supply? [place]

## 2021-02-23 NOTE — Telephone Encounter (Signed)
Pt's medications were sent to pt's pharmacy as requested. Confirmation received.  

## 2021-03-04 ENCOUNTER — Telehealth: Payer: Self-pay | Admitting: Gastroenterology

## 2021-03-04 MED ORDER — BALSALAZIDE DISODIUM 750 MG PO CAPS: 1500.0000 mg | ORAL_CAPSULE | Freq: Two times a day (BID) | ORAL | 0 refills | Status: DC

## 2021-03-04 NOTE — Telephone Encounter (Signed)
Informed patient she is over due for her f/u appt with Dr. Fuller Plan since last year she was undergoing chemo treatments and could not come into the office. Patient scheduled appt for 03/30/21 at 2:30pm. Prescription sent to patient's pharmacy.

## 2021-03-04 NOTE — Telephone Encounter (Signed)
Pt needs rf for balsalazide. Pls send it to [facility] in [place], [place]. Phone number is [phone].

## 2021-03-11 ENCOUNTER — Telehealth: Payer: Self-pay | Admitting: Oncology

## 2021-03-11 NOTE — Telephone Encounter (Signed)
[name] [name] called and rescheduled her appointment tomorrow to 03/15/21 due to the weather.

## 2021-03-12 ENCOUNTER — Inpatient Hospital Stay: Payer: Medicare Other | Admitting: Gynecologic Oncology

## 2021-03-15 ENCOUNTER — Other Ambulatory Visit: Payer: Self-pay

## 2021-03-15 ENCOUNTER — Encounter: Payer: Self-pay | Admitting: Gynecologic Oncology

## 2021-03-15 ENCOUNTER — Inpatient Hospital Stay: Payer: Medicare Other | Attending: Gynecologic Oncology | Admitting: Gynecologic Oncology

## 2021-03-15 VITALS — BP 117/54 | HR 87 | Temp 98.0°F | Resp 16 | Ht 66.0 in | Wt 162.0 lb

## 2021-03-15 DIAGNOSIS — Z923 Personal history of irradiation: Secondary | ICD-10-CM | POA: Insufficient documentation

## 2021-03-15 DIAGNOSIS — Z90722 Acquired absence of ovaries, bilateral: Secondary | ICD-10-CM | POA: Diagnosis not present

## 2021-03-15 DIAGNOSIS — R42 Dizziness and giddiness: Secondary | ICD-10-CM | POA: Insufficient documentation

## 2021-03-15 DIAGNOSIS — Z9221 Personal history of antineoplastic chemotherapy: Secondary | ICD-10-CM | POA: Diagnosis not present

## 2021-03-15 DIAGNOSIS — Z8542 Personal history of malignant neoplasm of other parts of uterus: Secondary | ICD-10-CM | POA: Insufficient documentation

## 2021-03-15 DIAGNOSIS — Z90710 Acquired absence of both cervix and uterus: Secondary | ICD-10-CM | POA: Insufficient documentation

## 2021-03-15 DIAGNOSIS — C55 Malignant neoplasm of uterus, part unspecified: Secondary | ICD-10-CM

## 2021-03-15 NOTE — Progress Notes (Signed)
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 Severe distension of the endometrium highly suspicious for cervical obstruction or stenosis possibly due to a neoplastic process in this postmenopausal female with history of vaginal bleeding. Further evaluation with hysteroscopy is  recommended. 2. Diarrheal state. Correlation with clinical exam and stool cultures recommended. No bowel obstruction. Normal appendix.   01/03/2020 Surgery   D&C A. ENDOMETRIUM, CURETTAGE:  -  Poorly differentiated malignancy  -  See comment  COMMENT:  The material consists of a poorly differentiated malignancy with extensive necrosis.  By immunohistochemistry, p16 is positive and there is focal cytokeratin AE1/3 positivity but negative for cytokeratin 7, cytokeratin 5/6, CD20, CD3, p53, WT-1, Pax-8, and S100. The differential includes poorly differentiated carcinoma and high-grade stromal sarcoma.    02/04/2020 Surgery   Robotic-assisted laparoscopic total hysterectomy with bilateral salpingoophorectomy, SLN biopsy, left pelvic LND   On EUA, 10cm mobile uterus. ON intra-abdominal entry, normal upper abdominal survey. Normal appearing omentum, small and large bowel. Uterus 10cm and bulbous. Tortuous and hyperemic ovarian vasculature. Some tubal metaplasia vs. Metastatic disease. Normal appearing ovaries. Mapping successful to deep right obturator SLN and presacral SLN. NO mapping on left. Enlarge deep obturator lymph on the left. Given high grade histology, short small bowel mesentery, and recent COVID infection, decision made not to proceed with left PA LND (would have likely required open procedure. NO intra-abdominal or pelvic evidence of disease.    02/04/2020  Pathology Results   FINAL MICROSCOPIC DIAGNOSIS:   A. SENTINEL LYMPH NODE, RIGHT OBTURATOR, EXCISION:  -  No carcinoma identified in one lymph node (0/1)  -  See comment   B. UTERUS AND BILATERAL FALLOPIAN TUBES AND OVARIES, HYSTERECTOMY AND  BILATERAL SALPINGO-OOPHORECTOMY:  -  Carcinosarcoma confined to the uterus and involving less than half of the myometrium  -  See oncology table and comment below   C. LYMPH NODES, LEFT PELVIC, REGIONAL RESECTION:  -  No carcinoma identified in three lymph nodes (0/3)   D. SENTINEL LYMPH NODE, PRESACRAL, EXCISION:  -  No carcinoma identified in one lymph node (0/1)   COMMENT:   A.  Cytokeratin AE1/3 was performed on the sentinel lymph nodes to exclude micrometastasis.  There is no evidence of metastatic carcinoma by immunohistochemistry.   B.  The tumor consists of poorly cohesive epithelioid and rhabdoid cells.  A panel of immunohistochemistry (p16, f, p53, PAX 8, SMA, CD10, CD45, CD 99, CD117, CD138, cytokeratin 7, cyclin D1, cytokeratin AE1/3, EMA and GATA3) is performed to better classify this neoplasm.  By immunohistochemistry, the neoplastic cells are positive for p16, p53 and  PAX 8.  The epithelioid cells are positive for cytokeratin AE1/3 and EMA while the rhabdoid cells show positivity for desmin. Overall, the immunophenotype and morphology supports the diagnosis of carcinosarcoma.    ONCOLOGY TABLE:   UTERUS, CARCINOMA OR CARCINOSARCOMA   Procedure: Total hysterectomy and bilateral salpingo-oophorectomy  Histologic type: Carcinosarcoma  Histologic Grade: FIGO grade 3  Myometrial invasion: Estimated to be less than 50%  Uterine Serosa Involvement: Not identified  Cervical stromal involvement: Not identified  Extent of involvement of other organs: Not identified  Lymphovascular invasion: Not identified  Regional Lymph Nodes:       Examined:  2 Sentinel                               3 non-sentinel                                5 total        Lymph nodes with metastasis: 0        Isolated tumor cells (<0.2 mm): 0        Micrometastasis:  (>0.2 mm and < 2.0 mm): 0        Macrometastasis: (>2.0 mm): 0        Extracapsular extension: N/A  Representative Tumor Block: B1  MMR / MSI testing: Will be ordered  Pathologic Stage Classification (pTNM, AJCC 8th edition):  pT1a, pN0  Comments: See above    02/04/2020 Cancer Staging   Staging form: Corpus Uteri - Carcinoma and Carcinosarcoma, AJCC 8th Edition - Clinical stage from 02/04/2020: FIGO Stage IA (cT1a, cN0(sn), cM0) - Signed by Lafonda Mosses, MD on 02/12/2020   03/06/2020 Imaging   Placement of single lumen port a cath via right internal jugular vein. The catheter tip lies at the cavo-atrial junction. A power injectable port a cath was placed and is ready for immediate use.   03/12/2020 -  Chemotherapy   The patient had carboplatin and taxol for chemotherapy treatment.     04/16/2020 - 05/11/2020 Radiation Therapy   04/16/2020 through 05/11/2020 Site Technique Total Dose (Gy) Dose per Fx (Gy) Completed Fx Beam Energies  Vagina: Pelvis HDR-brachy 30/30 6 5/5 Ir-192       08/10/2020 Imaging   No evidence of recurrent or metastatic carcinoma within the abdomen or pelvis.   Aortic Atherosclerosis (ICD10-I70.0).   08/24/2020 Procedure   Successful removal of implanted Port-A-Cath.     Interval History: Patient presents today for surveillance visit.  She notes overall doing well.  She denies any vaginal bleeding or discharge.  She endorses a good appetite without nausea or emesis.  She reports normal bowel and bladder function.  She notes that very occasionally (less than once a month) she will get a little bit dizzy, most frequently if she gets up too quickly.  She also has some mild intermittent cramping that she describes in her right groin since undergoing vaginal brachytherapy.  She is continuing to use her vaginal dilator 3 times a week.  Past  Medical/Surgical History: Past Medical History:  Diagnosis Date   Adenomatous colon polyp 11/2005   Anginal pain ([place])    last time 2016   Anxiety    Arthritis    CAD (coronary artery disease)    2V CABG 1996 (LIMA to LAD, SVG to RCA) Dr. Servando Snare.; last stress test in 2010   Diabetes mellitus without complication ([place])    type II   GERD (gastroesophageal reflux disease)    History of radiation therapy    Uterus- Dr. Gery Pray 04/16/20-05/11/20   Hyperlipidemia    Hypertension    Muscle cramps    and pains   Myocardial infarction ([place])    1996   Pneumonia    hx of x 2    Ulcerative colitis    Uterine cancer ([place])     Past Surgical History:  Procedure Laterality Date   BACK SURGERY     lumbar fusion 25 years ago and ruptured disk 10 years ago same  area   BACK SURGERY     ruptered disc under lumbar disc   CARDIAC CATHETERIZATION N/A 10/13/2014   Procedure: Left Heart Cath And Coronary Angiography;  Surgeon: Burnell Blanks, MD;  Location: [facility];  Service: Cardiovascular;  Laterality: N/A;   CARDIAC CATHETERIZATION N/A 10/13/2014   Procedure: Coronary Stent Intervention;  Surgeon: Burnell Blanks, MD;  Location: [facility];  Service: Cardiovascular;  Laterality: N/A;   COLONOSCOPY     CORONARY ANGIOPLASTY WITH STENT PLACEMENT  10/13/2014   DES to [place]   x 2   [place] N/A 01/03/2020   Procedure: DILATATION & CURETTAGE/HYSTEROSCOPY WITH POSSIBLE MYOSURE;  Surgeon: Paula Compton, MD;  Location: [facility];  Service: Gynecology;  Laterality: N/A;   DILATION AND EVACUATION N/A 01/03/2020   Procedure: DILATATION AND EVACUATION;  Surgeon: Paula Compton, MD;  Location: [facility];  Service: Gynecology;  Laterality: N/A;   IR IMAGING GUIDED PORT INSERTION  03/06/2020   IR REMOVAL TUN ACCESS W/ PORT W/O FL MOD SED  08/24/2020   OPERATIVE ULTRASOUND N/A  01/03/2020   Procedure: OPERATIVE ULTRASOUND;  Surgeon: Paula Compton, MD;  Location: [facility];  Service: Gynecology;  Laterality: N/A;   POLYPECTOMY     ROBOTIC ASSISTED TOTAL HYSTERECTOMY WITH BILATERAL SALPINGO OOPHERECTOMY Bilateral 02/04/2020   Procedure: XI ROBOTIC ASSISTED TOTAL HYSTERECTOMY WITH BILATERAL SALPINGO OOPHORECTOMY, LYMPH NODE DISSECTION;  Surgeon: Lafonda Mosses, MD;  Location: [facility];  Service: Gynecology;  Laterality: Bilateral;   SENTINEL NODE BIOPSY N/A 02/04/2020   Procedure: SENTINEL NODE BIOPSY;  Surgeon: Lafonda Mosses, MD;  Location: [facility];  Service: Gynecology;  Laterality: N/A;   UPPER GASTROINTESTINAL ENDOSCOPY      Family History  Problem Relation Age of Onset   Diabetes Mother    Heart disease Mother    Heart attack Mother 47   Hypertension Mother    Lung cancer Brother    Colon cancer Brother    Breast cancer Sister    Kidney disease Brother        renal cancer   Heart disease Sister    Colon cancer Daughter        Thinks her daughter had negative genetic testing   Hypertension Sister    Stroke Sister    Pancreatitis Other    Breast cancer Daughter    Stomach cancer Neg Hx     Social History   Socioeconomic History   Marital status: Widowed    Spouse name: Ray   Number of children: 7   Years of education: 12   Highest education level: Not on file  Occupational History   Occupation: retired  Tobacco Use   Smoking status: Former    Years: 20.00    Types: Cigarettes    Quit date: 10/04/1994    Years since quitting: 26.4   Smokeless tobacco: Never  Vaping Use   Vaping Use: Never used  Substance and Sexual Activity   Alcohol use: No   Drug use: No   Sexual activity: Not Currently    Partners: Male  Other Topics Concern   Not on file  Social History Narrative   Not on file   Social Determinants of Health   Financial Resource Strain: Not on file  Food Insecurity: Not on file  Transportation Needs: Not on file   Physical Activity: Not on file  Stress: Not on file  Social  Connections: Not on file    Current Medications:  Current Outpatient Medications:    balsalazide (COLAZAL) 750 MG capsule, Take 2 capsules (1,500 mg total) by mouth 2 (two) times daily., Disp: 120 capsule, Rfl: 0   calcium carbonate (OS-CAL) 600 MG TABS tablet, Take 600 mg by mouth daily with breakfast., Disp: , Rfl:    carvedilol (COREG) 3.125 MG tablet, Take 1 tablet (3.125 mg total) by mouth 2 (two) times daily., Disp: 180 tablet, Rfl: 1   Cholecalciferol (VITAMIN D) 50 MCG (2000 UT) tablet, Take 2,000 Units by mouth daily., Disp: , Rfl:    clopidogrel (PLAVIX) 75 MG tablet, Take 1 tablet (75 mg total) by mouth daily., Disp: 90 tablet, Rfl: 1   FLUoxetine (PROZAC) 10 MG capsule, Take 10 mg by mouth daily. , Disp: , Rfl:    LORazepam (ATIVAN) 1 MG tablet, Take 1 tablet (1 mg total) by mouth every 8 (eight) hours as needed for anxiety (for anxiety)., Disp: 60 tablet, Rfl: 1   losartan-hydrochlorothiazide (HYZAAR) 100-12.5 MG tablet, Take 1 tablet by mouth daily., Disp: , Rfl:    metFORMIN (GLUCOPHAGE-XR) 500 MG 24 hr tablet, Take 500 mg by mouth daily with supper. , Disp: , Rfl: 5   Multiple Vitamins-Minerals (MULTIVITAMIN WITH MINERALS) tablet, Take 1 tablet by mouth daily., Disp: , Rfl:    nitroGLYCERIN (NITROSTAT) 0.4 MG SL tablet, Place 1 tablet (0.4 mg total) under the tongue every 5 (five) minutes as needed for chest pain., Disp: 25 tablet, Rfl: 3   pantoprazole (PROTONIX) 40 MG tablet, Take 1 tablet (40 mg total) by mouth daily., Disp: 90 tablet, Rfl: 1   simvastatin (ZOCOR) 40 MG tablet, Take 1 tablet (40 mg total) by mouth every evening., Disp: 90 tablet, Rfl: 1   docusate sodium (COLACE) 100 MG capsule, Take 100 mg by mouth daily.  (Patient not taking: No sig reported), Disp: , Rfl:   Review of Systems: Pertinent positives include intermittent dizziness, difficulty with walking. Denies appetite changes, fevers, chills,  fatigue, unexplained weight changes. Denies hearing loss, neck lumps or masses, mouth sores, ringing in ears or voice changes. Denies cough or wheezing.  Denies shortness of breath. Denies chest pain or palpitations. Denies leg swelling. Denies abdominal distention, pain, blood in stools, constipation, diarrhea, nausea, vomiting, or early satiety. Denies pain with intercourse, dysuria, frequency, hematuria or incontinence. Denies hot flashes, pelvic pain, vaginal bleeding or vaginal discharge.   Denies joint pain, back pain or muscle pain/cramps. Denies itching, rash, or wounds. Denies headaches, numbness or seizures. Denies swollen lymph nodes or glands, denies easy bruising or bleeding. Denies anxiety, depression, confusion, or decreased concentration.  Physical Exam: BP (!) 117/54 (Patient Position: Sitting)   Pulse 87   Temp 98 F (36.7 C) (Oral)   Resp 16   Ht _0  (1.676 m)   Wt 162 lb (73.5 kg)   SpO2 97% Comment: RA  BMI 26.15 kg/m  General: Alert, oriented, no acute distress. HEENT: Atraumatic, normocephalic, sclera anicteric. Chest: Clear to auscultation bilaterally.  No wheezes or rhonchi. Cardiovascular: Regular rate and rhythm, no murmurs. Abdomen: soft, nontender.  Normoactive bowel sounds.  No masses or hepatosplenomegaly appreciated.  Well-healed incisions. Extremities: Grossly normal range of motion.  Warm, well perfused.  No edema bilaterally. Skin: No rashes or lesions noted. Lymphatics: No cervical, supraclavicular, or inguinal adenopathy. GU: Normal appearing external genitalia without erythema, excoriation, or lesions.  Speculum exam reveals moderately atrophic vaginal mucosa, no lesions or masses, no bleeding or discharge.  Bimanual exam reveals cuff intact, no nodularity or masses.  There is some agglutination towards the very apex of the vagina.  Rectovaginal exam confirms findings.  Laboratory & Radiologic Studies: None new  Assessment & Plan: [name] [name] is a 75 y.o. woman with stage IA carcinosarcoma who presents for surveillance visit, completed adjuvant therapy at the end of 2021.   Patient is overall doing well and is NED on exam today.    She has some symptoms of dizziness occasionally.  I encouraged her to make sure that she is drinking plenty of water and staying hydrated.  If these become more frequent, I have recommended that she reach out to her primary care provider.  We discussed signs and symptoms that would be concerning for disease recurrence, and she knows to call if she develops any of these sooner than her next scheduled visit.  Per NCCN recommendations in the setting of her high risk histology, we will continue with visits every 3 months for the first 2 years.  She is scheduled to see radiation oncology in 3 months and I will see her back in 6 months.  She knows to call the clinic in the summer to schedule that visit.   She was encouraged to continue using her vaginal dilator to prevent vaginal agglutination after radiation.  34 minutes of total time was spent for this patient encounter, including preparation, face-to-face counseling with the patient and coordination of care, and documentation of the encounter.  Jeral Pinch, MD  Division of Gynecologic Oncology  Department of Obstetrics and Gynecology  [facility]

## 2021-03-15 NOTE — Patient Instructions (Signed)
Its good to see you today. I don't see or feel any evidence of cancer on your exam. You are scheduled to see Dr. Sondra Come in a couple of months. I will see you back in late March. If anything change before your next visit, please call to see me sooner.  Please call after January 2023 if you haven't gotten a call from [place] to schedule your follow-up visit.

## 2021-03-30 ENCOUNTER — Ambulatory Visit: Payer: Medicare Other | Admitting: Gastroenterology

## 2021-03-30 ENCOUNTER — Encounter: Payer: Self-pay | Admitting: Gastroenterology

## 2021-03-30 VITALS — BP 146/68 | HR 76 | Ht 66.0 in | Wt 174.0 lb

## 2021-03-30 DIAGNOSIS — Z860101 Personal history of adenomatous and serrated colon polyps: Secondary | ICD-10-CM

## 2021-03-30 DIAGNOSIS — Z86010 Personal history of colonic polyps: Secondary | ICD-10-CM

## 2021-03-30 DIAGNOSIS — K5150 Left sided colitis without complications: Secondary | ICD-10-CM

## 2021-03-30 MED ORDER — MESALAMINE ER 0.375 G PO CP24: 1.5000 g | ORAL_CAPSULE | Freq: Every day | ORAL | 3 refills | Status: DC

## 2021-03-30 NOTE — Progress Notes (Signed)
    History of Present Illness: This is a 75 year old female with a history of left-sided ulcerative colitis.  Her symptoms have been clinically inactive.  Her last colonoscopy performed in June 2020 showed no endoscopic or biopsy evidence of colitis activity.  She relocated to [place], [place] and she needed to change insurance plans.  She relates balsalazide is no longer well covered at $300 for 3-monthsupply.   Current Medications, Allergies, Past Medical History, Past Surgical History, Family History and Social History were reviewed in [employer].   Physical Exam: General: Well developed, well nourished, no acute distress Head: Normocephalic and atraumatic Eyes: Sclerae anicteric, EOMI Ears: Normal auditory acuity Mouth: Not examined, mask on during Covid-19 pandemic Lungs: Clear throughout to auscultation Heart: Regular rate and rhythm; no murmurs, rubs or bruits Abdomen: Soft, non tender and non distended. No masses, hepatosplenomegaly or hernias noted. Normal Bowel sounds Rectal: Not done Musculoskeletal: Symmetrical with no gross deformities  Pulses:  Normal pulses noted Extremities: No clubbing, cyanosis, edema or deformities noted Neurological: Alert oriented x 4, grossly nonfocal Psychological:  Alert and cooperative. Normal mood and affect   Assessment and Recommendations:  Left sided ulcerative colitis, asymptomatic.  Attempt to find a 5-ASA medication that has better insurance coverage on her current plan.  Trial of Apriso 1.5 g PO daily.  If ADorthy Cooleris not adequately covered she is advised to call.  Surveillance colonoscopy is recommended in June 2023. REV in 1 year. Personal history of adenomatous polyps.  Surveillance colonoscopy as above. GERD.  Continue pantoprazole 40 mg p.o. daily. CAD post DES maintained on Plavix.

## 2021-03-30 NOTE — Patient Instructions (Signed)
You will be due for a recall colonoscopy in 11/2021. We will send you a reminder in the mail when it gets closer to that time.  Please follow up with Dr Fuller Plan in the office in 1 year.  We have sent the following medications to your pharmacy for you to pick up at your convenience: Apriso 0.375 mg -Take 4 tablets by mouth every morning. (In place of balsalazide).  If you are age 74 or older, your body mass index should be between 23-30. Your Body mass index is 28.08 kg/m. If this is out of the aforementioned range listed, please consider follow up with your Primary Care Provider.  If you are age 42 or younger, your body mass index should be between 19-25. Your Body mass index is 28.08 kg/m. If this is out of the aformentioned range listed, please consider follow up with your Primary Care Provider.   ________________________________________________________  The [facility] providers would like to encourage you to use [facility] to communicate with providers for non-urgent requests or questions.  Due to long hold times on the telephone, sending your provider a message by [facility] may be a faster and more efficient way to get a response.  Please allow 48 business hours for a response.  Please remember that this is for non-urgent requests.   Due to recent changes in healthcare laws, you may see the results of your imaging and laboratory studies on MyChart before your provider has had a chance to review them.  We understand that in some cases there may be results that are confusing or concerning to you. Not all laboratory results come back in the same time frame and the provider may be waiting for multiple results in order to interpret others.  Please give [place] 48 hours in order for your provider to thoroughly review all the results before contacting the office for clarification of your results.

## 2021-05-26 ENCOUNTER — Telehealth: Payer: Self-pay | Admitting: *Deleted

## 2021-05-26 NOTE — Telephone Encounter (Signed)
CALLED PATIENT TO ASK ABOUT RESCHEDULING FU ON 06-07-21 DUE TO THE [facility]OL[facility], SPOKE WITH MS. Bangerter AND APPT. HAS BEEN RESCHEDULED FOR 07-05-21 @ 3 PM, PATIENT AGREED TO NEW DATE AND TIME

## 2021-06-07 ENCOUNTER — Ambulatory Visit: Payer: Medicare Other | Admitting: Radiation Oncology

## 2021-06-30 ENCOUNTER — Encounter: Payer: Self-pay | Admitting: Radiology

## 2021-07-04 NOTE — Progress Notes (Signed)
Radiation Oncology         [phone] ________________________________  Name: [name] [name] MRN: [id number]  Date: 07/05/2021  DOB: 1945/11/16  Follow-Up Visit Note  CC: [name], MD  [name], MD    ICD-10-CM   1. 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 Since her last visit, the patient followed up with Dr. Berline Lopes on 03/15/21. During which time, the patient reported feeling well and denied any vaginal bleeding or discharge. The patient did note that very occasionally (less than once a month) she gets a little bit dizzy, mainly when she gets up too quickly. In addition, she reported some mild intermittent cramping in her right groin since undergoing vaginal brachytherapy. (She also reported that she continues to use her vaginal dilator 3 times a week). Otherwise, the patient was noted as NED on examination and will return to Dr. Berline Lopes for follow up in April.            Since her follow-up with Dr. Berline Lopes she denies any new medical issues.  She denies any abdominal bleeding vaginal bleeding or discharge.  She denies any hematuria or rectal bleeding.  She continues to use the vaginal dilator 3 times per week as recommended.  She denies any bleeding with use of the vaginal dilator.               Allergies:  has No Known Allergies.  Meds: Current Outpatient Medications  Medication Sig Dispense Refill   calcium carbonate (OS-CAL) 600 MG TABS tablet Take 600 mg by mouth daily with breakfast.     carvedilol (COREG) 3.125 MG  tablet Take 1 tablet (3.125 mg total) by mouth 2 (two) times daily. 180 tablet 1   Cholecalciferol (VITAMIN D) 50 MCG (2000 UT) tablet Take 2,000 Units by mouth daily.     clopidogrel (PLAVIX) 75 MG tablet Take 1 tablet (75 mg total) by mouth daily. 90 tablet 1   FLUoxetine (PROZAC) 10 MG capsule Take 10 mg by mouth daily.      LORazepam (ATIVAN) 1 MG tablet Take 1 tablet (1 mg total) by mouth every 8 (eight) hours as needed for anxiety (for anxiety). 60 tablet 1   mesalamine (APRISO) 0.375 g 24 hr capsule Take 4 capsules (1.5 g total) by mouth daily. 360 capsule 3   metFORMIN (GLUCOPHAGE-XR) 500 MG 24 hr tablet Take 500 mg by mouth daily with supper.   5   Multiple Vitamins-Minerals (MULTIVITAMIN WITH MINERALS) tablet Take 1 tablet by mouth daily.     nitroGLYCERIN (NITROSTAT) 0.4 MG SL tablet Place 1 tablet (0.4 mg total) under the tongue every 5 (five) minutes as needed for chest pain. 25 tablet 3   olmesartan-hydrochlorothiazide (BENICAR HCT) 40-12.5 MG tablet Take 1 tablet by mouth daily.     pantoprazole (PROTONIX) 40 MG tablet Take 1 tablet (40 mg total) by mouth daily. 90 tablet 1   simvastatin (ZOCOR) 40 MG tablet Take 1 tablet (40 mg total) by mouth every evening. 90 tablet 1  No current facility-administered medications for this encounter.    Physical Findings: The patient is in no acute distress. Patient is alert and oriented.  height is 5\' 6"  (1.676 m) and weight is 169 lb 9.6 oz (76.9 kg). Her temperature is 97.8 F (36.6 C). Her blood pressure is 107/61 and her pulse is 90. Her respiration is 20 and oxygen saturation is 97%. .  No significant changes. Lungs are clear to auscultation bilaterally. Heart has regular rate and rhythm. No palpable cervical, supraclavicular, or axillary adenopathy. Abdomen soft, non-tender, normal bowel sounds.  On pelvic examination the external genitalia were unremarkable. A speculum exam was performed. There are no mucosal lesions noted in the  vaginal vault.  Some radiation changes noted in the region of the vaginal cuff.  On bimanual and rectovaginal examination there were no pelvic masses appreciated.  Rectal sphincter tone good.    Lab Findings: Lab Results  Component Value Date   WBC 3.9 (L) 08/24/2020   HGB 10.5 (L) 08/24/2020   HCT 30.7 (L) 08/24/2020   MCV 101.0 (H) 08/24/2020   PLT 188 08/24/2020    Radiographic Findings: No results found.  Impression: Early stage carcinosarcoma  No evidence of recurrence on clinical exam today.  She does not appear to exhibit any lasting effects from her surgery and vaginal brachytherapy.  Plan: She will follow-up with Dr. Berline Lopes in 3 months.  Radiation oncology in 6 months.   20 minutes of total time was spent for this patient encounter, including preparation, face-to-face counseling with the patient and coordination of care, physical exam, and documentation of the encounter. ____________________________________  Blair Promise, PhD, MD  This document serves as a record of services personally performed by Gery Pray, MD. It was created on his behalf by Roney Mans, a trained medical scribe. The creation of this record is based on the scribe's personal observations and the provider's statements to them. This document has been checked and approved by the attending provider.

## 2021-07-05 ENCOUNTER — Encounter: Payer: Self-pay | Admitting: Radiation Oncology

## 2021-07-05 ENCOUNTER — Ambulatory Visit
Admission: RE | Admit: 2021-07-05 | Discharge: 2021-07-05 | Disposition: A | Discharge status: Home / Self Care | Payer: Medicare Other | Source: Ambulatory Visit | Attending: Radiation Oncology | Admitting: Radiation Oncology

## 2021-07-05 ENCOUNTER — Other Ambulatory Visit: Payer: Self-pay

## 2021-07-05 VITALS — BP 107/61 | HR 90 | Temp 97.8°F | Resp 20 | Ht 66.0 in | Wt 169.6 lb

## 2021-07-05 DIAGNOSIS — Z79899 Other long term (current) drug therapy: Secondary | ICD-10-CM | POA: Diagnosis not present

## 2021-07-05 DIAGNOSIS — Z8542 Personal history of malignant neoplasm of other parts of uterus: Secondary | ICD-10-CM | POA: Insufficient documentation

## 2021-07-05 DIAGNOSIS — Z7984 Long term (current) use of oral hypoglycemic drugs: Secondary | ICD-10-CM | POA: Insufficient documentation

## 2021-07-05 DIAGNOSIS — C55 Malignant neoplasm of uterus, part unspecified: Secondary | ICD-10-CM

## 2021-07-05 NOTE — Progress Notes (Signed)
[name] [name] is here today for follow up post radiation to the pelvic.  They completed their radiation on: 05/11/2020   Does the patient complain of any of the following:  Pain:denies Abdominal bloating: denies Diarrhea/Constipation: occasional diarrhea Nausea/Vomiting: denies Vaginal Discharge: denies Blood in Urine or Stool: denies Urinary Issues (dysuria/incomplete emptying/ incontinence/ increased frequency/urgency): nocturia x1, occasional urgency, incontinence Does patient report using vaginal dilator 2-3 times a week and/or sexually active 2-3 weeks: yes Post radiation skin changes: none   Additional comments if applicable: none  Vitals:   07/05/21 1455  BP: 107/61  Pulse: 90  Resp: 20  Temp: 97.8 F (36.6 C)  SpO2: 97%  Weight: 169 lb 9.6 oz (76.9 kg)  Height: 5\' 6"  (1.676 m)

## 2021-07-12 ENCOUNTER — Other Ambulatory Visit: Payer: Self-pay

## 2021-07-12 ENCOUNTER — Encounter: Payer: Self-pay | Admitting: Cardiovascular Disease

## 2021-07-12 ENCOUNTER — Ambulatory Visit: Payer: Medicare Other | Admitting: Cardiovascular Disease

## 2021-07-12 VITALS — BP 122/72 | HR 84 | Ht 66.0 in | Wt 169.0 lb

## 2021-07-12 DIAGNOSIS — E7800 Pure hypercholesterolemia, unspecified: Secondary | ICD-10-CM

## 2021-07-12 DIAGNOSIS — I2510 Atherosclerotic heart disease of native coronary artery without angina pectoris: Secondary | ICD-10-CM | POA: Diagnosis not present

## 2021-07-12 DIAGNOSIS — I10 Essential (primary) hypertension: Secondary | ICD-10-CM | POA: Diagnosis not present

## 2021-07-12 NOTE — Progress Notes (Signed)
Chief Complaint  Patient presents with   Follow-up    CAD   History of Present Illness: 76 yo female with h/o CAD with acute anterior MI April 1996 with angioplasty, CABG June 1996 (LIMA to LAD, SVG to RCA), stenting Circumflex 2016, HLD, HTN, ulcerative colitis here today for cardiology followup. Last cardiac cath in May 2016 and she was found to have a severe stenosis in the mid Circumflex treated with a drug eluting stent. The LIMA was patent to the LAD and the SVG was patent to the PDA. Echo January 2019 with OTLX=72-62%, grade 2 diastolic dysfunction. She had uterine cancer and she has completed chemotherapy and radiation therapy in January 2022. She had a total hysterectomy.   She is here today for follow up. The patient denies any chest pain, dyspnea, palpitations, lower extremity edema, orthopnea, PND, dizziness, near syncope or syncope.   Primary Care Physician: [name] Ada, MD  Past Medical History:  Diagnosis Date   Adenomatous colon polyp 11/2005   Anginal pain ([place])    last time 2016   Anxiety    Arthritis    CAD (coronary artery disease)    2V CABG 1996 (LIMA to LAD, SVG to RCA) Dr. Servando Snare.; last stress test in 2010   Diabetes mellitus without complication ([place])    type II   GERD (gastroesophageal reflux disease)    History of radiation therapy    Uterus- [facility] brachytherapy - Dr. Gery Pray 04/16/20-05/11/20   Hyperlipidemia    Hypertension    Muscle cramps    and pains   Myocardial infarction ([place])    1996   Pneumonia    hx of x 2    Ulcerative colitis    Uterine cancer ([place])     Past Surgical History:  Procedure Laterality Date   BACK SURGERY     lumbar fusion 25 years ago and ruptured disk 10 years ago same area   BACK SURGERY     ruptered disc under lumbar disc   CARDIAC CATHETERIZATION N/A 10/13/2014   Procedure: Left Heart Cath And Coronary Angiography;  Surgeon: Burnell Blanks, MD;  Location: [facility];  Service:  Cardiovascular;  Laterality: N/A;   CARDIAC CATHETERIZATION N/A 10/13/2014   Procedure: Coronary Stent Intervention;  Surgeon: Burnell Blanks, MD;  Location: [facility];  Service: Cardiovascular;  Laterality: N/A;   COLONOSCOPY     CORONARY ANGIOPLASTY WITH STENT PLACEMENT  10/13/2014   DES to [place]   x 2   [place] N/A 01/03/2020   Procedure: DILATATION & CURETTAGE/HYSTEROSCOPY WITH POSSIBLE MYOSURE;  Surgeon: Paula Compton, MD;  Location: [facility];  Service: Gynecology;  Laterality: N/A;   DILATION AND EVACUATION N/A 01/03/2020   Procedure: DILATATION AND EVACUATION;  Surgeon: Paula Compton, MD;  Location: [facility];  Service: Gynecology;  Laterality: N/A;   IR IMAGING GUIDED PORT INSERTION  03/06/2020   IR REMOVAL TUN ACCESS W/ PORT W/O FL MOD SED  08/24/2020   OPERATIVE ULTRASOUND N/A 01/03/2020   Procedure: OPERATIVE ULTRASOUND;  Surgeon: Paula Compton, MD;  Location: [facility];  Service: Gynecology;  Laterality: N/A;   POLYPECTOMY     ROBOTIC ASSISTED TOTAL HYSTERECTOMY WITH BILATERAL SALPINGO OOPHERECTOMY Bilateral 02/04/2020   Procedure: XI ROBOTIC ASSISTED TOTAL HYSTERECTOMY WITH BILATERAL SALPINGO OOPHORECTOMY, LYMPH NODE DISSECTION;  Surgeon: Lafonda Mosses, MD;  Location: [facility];  Service: Gynecology;  Laterality: Bilateral;   SENTINEL NODE BIOPSY N/A 02/04/2020   Procedure: SENTINEL NODE BIOPSY;  Surgeon: Lafonda Mosses, MD;  Location: [facility];  Service: Gynecology;  Laterality: N/A;   UPPER GASTROINTESTINAL ENDOSCOPY      Current Outpatient Medications  Medication Sig Dispense Refill   calcium carbonate (OS-CAL) 600 MG TABS tablet Take 600 mg by mouth daily with breakfast.     carvedilol (COREG) 3.125 MG tablet Take 1 tablet (3.125 mg total) by mouth 2 (two) times daily. 180 tablet 1   Cholecalciferol (VITAMIN D) 50 MCG (2000 UT) tablet Take 2,000 Units by mouth daily.      clopidogrel (PLAVIX) 75 MG tablet Take 1 tablet (75 mg total) by mouth daily. 90 tablet 1   FLUoxetine (PROZAC) 10 MG capsule Take 10 mg by mouth daily.      LORazepam (ATIVAN) 1 MG tablet Take 1 tablet (1 mg total) by mouth every 8 (eight) hours as needed for anxiety (for anxiety). 60 tablet 1   mesalamine (APRISO) 0.375 g 24 hr capsule Take 4 capsules (1.5 g total) by mouth daily. 360 capsule 3   metFORMIN (GLUCOPHAGE-XR) 500 MG 24 hr tablet Take 500 mg by mouth daily with supper.   5   Multiple Vitamins-Minerals (MULTIVITAMIN WITH MINERALS) tablet Take 1 tablet by mouth daily.     nitroGLYCERIN (NITROSTAT) 0.4 MG SL tablet Place 1 tablet (0.4 mg total) under the tongue every 5 (five) minutes as needed for chest pain. 25 tablet 3   olmesartan-hydrochlorothiazide (BENICAR HCT) 40-12.5 MG tablet Take 1 tablet by mouth daily.     pantoprazole (PROTONIX) 40 MG tablet Take 1 tablet (40 mg total) by mouth daily. 90 tablet 1   simvastatin (ZOCOR) 40 MG tablet Take 1 tablet (40 mg total) by mouth every evening. 90 tablet 1   No current facility-administered medications for this visit.    No Known Allergies  Social History   Socioeconomic History   Marital status: Widowed    Spouse name: Ray   Number of children: 7   Years of education: 12   Highest education level: Not on file  Occupational History   Occupation: retired  Tobacco Use   Smoking status: Former    Years: 20.00    Types: Cigarettes    Quit date: 10/04/1994    Years since quitting: 26.7   Smokeless tobacco: Never  Vaping Use   Vaping Use: Never used  Substance and Sexual Activity   Alcohol use: No   Drug use: No   Sexual activity: Not Currently    Partners: Male  Other Topics Concern   Not on file  Social History Narrative   Not on file   Social Determinants of Health   Financial Resource Strain: Not on file  Food Insecurity: Not on file  Transportation Needs: Not on file  Physical Activity: Not on file   Stress: Not on file  Social Connections: Not on file  Intimate Partner Violence: Not on file    Family History  Problem Relation Age of Onset   Diabetes Mother    Heart disease Mother    Heart attack Mother 61   Hypertension Mother    Lung cancer Brother    Colon cancer Brother    Breast cancer Sister    Kidney disease Brother        renal cancer   Heart disease Sister    Colon cancer Daughter        Thinks her daughter had  negative genetic testing   Hypertension Sister    Stroke Sister    Pancreatitis Other    Breast cancer Daughter    Stomach cancer Neg Hx     Review of Systems:  As stated in the HPI and otherwise negative.   BP 122/72    Pulse 84    Ht 5' 6"  (1.676 m)    Wt 169 lb (76.7 kg)    SpO2 96%    BMI 27.28 kg/m   Physical Examination:  General: Well developed, well nourished, NAD  HEENT: OP clear, mucus membranes moist  SKIN: warm, dry. No rashes. Neuro: No focal deficits  Musculoskeletal: Muscle strength 5/5 all ext  Psychiatric: Mood and affect normal  Neck: No JVD, no carotid bruits, no thyromegaly, no lymphadenopathy.  Lungs:Clear bilaterally, no wheezes, rhonci, crackles Cardiovascular: Regular rate and rhythm. No murmurs, gallops or rubs. Abdomen:Soft. Bowel sounds present. Non-tender.  Extremities: No lower extremity edema. Pulses are 2 + in the bilateral DP/PT.  Echo January 2019:  - Left ventricle: The cavity size was normal. Wall thickness was   normal. Systolic function was normal. The estimated ejection   fraction was in the range of 60% to 65%. Wall motion was normal;   there were no regional wall motion abnormalities. Features are   consistent with a pseudonormal left ventricular filling pattern,   with concomitant abnormal relaxation and increased filling   pressure (grade 2 diastolic dysfunction).  Cardiac cath Oct 13, 2014: Left main: Diffuse 20% stenosis.   Left Anterior Descending Artery: Large caliber vessel that courses to the  apex. The proximal vessel is occluded. The mid and distal vessel fills from the patent graft.   Circumflex Artery: Moderate caliber vessel with moderate caliber obtuse marginal branch. The mid vessel has a 99% stenosis.   Right Coronary Artery: Large dominant vessel with 100% mid occlusion. The distal vessel fills from the patent vein graft.  Graft Anatomy:  SVG to PDA is patent LIMA to mid LAD is patent Left Ventricular Angiogram: LVEF=60-65%.   EKG:  EKG is  ordered today. The ekg ordered today demonstrates  sinus, short run of atrial tach  Recent Labs: 08/10/2020: ALT 9; BUN 13; Creatinine 0.78; Potassium 3.6; Sodium 141 08/24/2020: Hemoglobin 10.5; Platelets 188   Lipid Panel    Component Value Date/Time   CHOL 147 08/17/2017 0859   TRIG 79 08/17/2017 0859   HDL 55 08/17/2017 0859   CHOLHDL 2.7 08/17/2017 0859   CHOLHDL 3.0 03/24/2015 0938   VLDL 12 03/24/2015 0938   LDLCALC 76 08/17/2017 0859     Wt Readings from Last 3 Encounters:  07/12/21 169 lb (76.7 kg)  07/05/21 169 lb 9.6 oz (76.9 kg)  03/30/21 174 lb (78.9 kg)     Other studies Reviewed: Additional studies/ records that were reviewed today include:  Review of the above records demonstrates:    Assessment and Plan:   1. CAD without angina: She is s/p 2V CABG in 1996. Last cardiac cath May 2016 at which time a DES was placed in the mid Circumflex. LIMA graft was patent to the LAD and the SVG was patent to the PDA. Echo January 2019 with normal LV function. She has no chest pain suggestive of angina. Will continue Plavix, beta blocker and statin.     2. HTN: BP is well controlled. No changes.   3. Hyperlipidemia: LDL not at goal in February 2022  in primary care. Repeat lipids now. If LDL not at goal,  will change to Crestor. Continue simvastatin for now.     Current medicines are reviewed at length with the patient today.  The patient does not have concerns regarding medicines.  The following changes have been  made:  no change  Labs/ tests ordered today include:  Orders Placed This Encounter  Procedures   Lipid panel   Hepatic function panel   EKG 12-Lead    Disposition:   She has moved to [place]. She will be establishing with a cardiologist there.   Signed, Lauree Chandler, MD 07/12/2021 3:47 PM    [facility] [address], [place], [place]  [address] Phone: [phone]; Fax: [phone]

## 2021-07-12 NOTE — Patient Instructions (Signed)
Medication Instructions:  Your physician recommends that you continue on your current medications as directed. Please refer to the Current Medication list given to you today.  *If you need a refill on your cardiac medications before your next appointment, please call your pharmacy*   Lab Work: Lipids, Lft's - Today   If you have labs (blood work) drawn today and your tests are completely normal, you will receive your results only by: [facility] (if you have MyChart) OR A paper copy in the mail If you have any lab test that is abnormal or we need to change your treatment, we will call you to review the results.   Testing/Procedures: None ordered    Follow-Up: Follow up as needed    Other Instructions None

## 2021-07-13 ENCOUNTER — Telehealth: Payer: Self-pay

## 2021-07-13 LAB — LIPID PANEL
Chol/HDL Ratio: 3.2 ratio (ref 0.0–4.4)
Cholesterol, Total: 174 mg/dL (ref 100–199)
HDL: 55 mg/dL (ref >39)
LDL Chol Calc (NIH): 97 mg/dL (ref 0–99)
Triglycerides: 123 mg/dL (ref 0–149)
VLDL Cholesterol Cal: 22 mg/dL (ref 5–40)

## 2021-07-13 LAB — HEPATIC FUNCTION PANEL
ALT: 12 IU/L (ref 0–32)
AST: 15 IU/L (ref 0–40)
Albumin: 4.3 g/dL (ref 3.7–4.7)
Alkaline Phosphatase: 75 IU/L (ref 44–121)
Bilirubin Total: 0.4 mg/dL (ref 0.0–1.2)
Bilirubin, Direct: 0.13 mg/dL (ref 0.00–0.40)
Total Protein: 6.6 g/dL (ref 6.0–8.5)

## 2021-07-13 MED ORDER — ROSUVASTATIN CALCIUM 20 MG PO TABS: 20.0000 mg | ORAL_TABLET | Freq: Every day | ORAL | 3 refills | Status: DC

## 2021-07-13 NOTE — Telephone Encounter (Signed)
-----   Message from Burnell Blanks, MD sent at 07/13/2021  1:30 PM EST ----- LDL not at goal. I will like to change her statin to Crestor 20 mg daily. Can we let her know and make sure she is ok with this? She probably has a different pharmacy now since she moved to [place]. Gerald Stabs

## 2021-07-13 NOTE — Telephone Encounter (Signed)
Patient made aware of results. Pt verbalized understanding and is agreeable to starting Crestor 20 mg daily. Prescription send to preferred pharmacy.

## 2021-08-02 ENCOUNTER — Telehealth: Payer: Self-pay | Admitting: *Deleted

## 2021-08-02 NOTE — Telephone Encounter (Signed)
CALLED PATIENT TO INFORM OF FU WITH DR. Berline Lopes ON 09-13-21- ARRIVAL TIME- 2:45 PM, SPOKE WITH PATIENT AND SHE IS AWARE OF THIS APPT.

## 2021-09-13 ENCOUNTER — Inpatient Hospital Stay: Payer: Medicare Other | Admitting: Gynecologic Oncology

## 2021-09-14 ENCOUNTER — Encounter: Payer: Self-pay | Admitting: Oncology

## 2021-09-14 NOTE — Progress Notes (Signed)
Saw Aide in the [facility] lobby.  She said she had to cancel her appointment with Dr. Berline Lopes yesterday because her daughter was sick.  Rescheduled appointment for 09/27/21 at 2:45.  She verbalized agreement of new appointment. ?

## 2021-09-24 ENCOUNTER — Ambulatory Visit: Payer: Medicare Other | Admitting: Gynecologic Oncology

## 2021-09-27 ENCOUNTER — Inpatient Hospital Stay: Payer: Medicare Other | Attending: Gynecologic Oncology | Admitting: Gynecologic Oncology

## 2021-09-27 ENCOUNTER — Other Ambulatory Visit: Payer: Self-pay

## 2021-09-27 ENCOUNTER — Encounter: Payer: Self-pay | Admitting: Gynecologic Oncology

## 2021-09-27 VITALS — BP 111/59 | HR 82 | Temp 98.0°F | Resp 20 | Ht 65.98 in | Wt 166.2 lb

## 2021-09-27 DIAGNOSIS — Z90722 Acquired absence of ovaries, bilateral: Secondary | ICD-10-CM | POA: Insufficient documentation

## 2021-09-27 DIAGNOSIS — Z8542 Personal history of malignant neoplasm of other parts of uterus: Secondary | ICD-10-CM

## 2021-09-27 DIAGNOSIS — Z9221 Personal history of antineoplastic chemotherapy: Secondary | ICD-10-CM | POA: Insufficient documentation

## 2021-09-27 DIAGNOSIS — Z90710 Acquired absence of both cervix and uterus: Secondary | ICD-10-CM | POA: Insufficient documentation

## 2021-09-27 DIAGNOSIS — C55 Malignant neoplasm of uterus, part unspecified: Secondary | ICD-10-CM

## 2021-09-27 DIAGNOSIS — Z923 Personal history of irradiation: Secondary | ICD-10-CM | POA: Insufficient documentation

## 2021-09-27 NOTE — Progress Notes (Signed)
Gynecologic Oncology Return Clinic Visit ? ?09/27/21 ? 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 ?Treatment History: ?Oncology History Overview Note  ?IHC MMR intact ?MSI-stable ?Carcinosarcoma ?  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 CT A/P: ?1. Severe distension of the endometrium highly suspicious for cervical obstruction or stenosis possibly due to a neoplastic ?process in this postmenopausal female with history of vaginal bleeding. Further evaluation with hysteroscopy is  recommended. ?2. Diarrheal state. Correlation with clinical exam and stool cultures recommended. No bowel obstruction. Normal appendix. ?  ?01/03/2020 Surgery  ? D&C ?A. ENDOMETRIUM, CURETTAGE:  ?-  Poorly differentiated malignancy  ?-  See comment  ?COMMENT:  ?The material consists of a poorly differentiated malignancy with extensive necrosis.  By immunohistochemistry, p16 is positive and there is focal cytokeratin AE1/3 positivity but negative for cytokeratin 7, cytokeratin 5/6, CD20, CD3, p53, WT-1, Pax-8, and S100. The differential includes poorly differentiated carcinoma and high-grade stromal sarcoma.  ?  ?02/04/2020 Surgery  ? Robotic-assisted laparoscopic total hysterectomy with bilateral salpingoophorectomy, SLN biopsy, left pelvic LND  ? ?On EUA, 10cm mobile uterus. ON intra-abdominal entry, normal upper abdominal survey. Normal appearing omentum, small and large bowel. Uterus 10cm and bulbous. Tortuous and hyperemic ovarian vasculature. Some tubal metaplasia vs. Metastatic disease. Normal appearing ovaries. Mapping successful to deep right obturator SLN and presacral SLN. NO mapping on left. Enlarge deep obturator lymph on the left. Given high grade histology, short small bowel mesentery, and recent COVID infection, decision made not to proceed with left PA LND (would have likely required open procedure. NO intra-abdominal or pelvic evidence of disease.  ?  ?02/04/2020  Pathology Results  ? FINAL MICROSCOPIC DIAGNOSIS:  ? ?A. SENTINEL LYMPH NODE, RIGHT OBTURATOR, EXCISION:  ?-  No carcinoma identified in one lymph node (0/1)  ?-  See comment  ? ?B. UTERUS AND BILATERAL FALLOPIAN TUBES AND OVARIES, HYSTERECTOMY AND  ?BILATERAL SALPINGO-OOPHORECTOMY:  ?-  Carcinosarcoma confined to the uterus and involving less than half of the myometrium  ?-  See oncology table and comment below  ? ?C. LYMPH NODES, LEFT PELVIC, REGIONAL RESECTION:  ?-  No carcinoma identified in three lymph nodes (0/3)  ? ?D. SENTINEL LYMPH NODE, PRESACRAL, EXCISION:  ?-  No carcinoma identified in one lymph node (0/1)  ? ?COMMENT:  ? ?A.  Cytokeratin AE1/3 was performed on the sentinel lymph nodes to exclude micrometastasis.  There is no evidence of metastatic carcinoma by immunohistochemistry.  ? ?B.  The tumor consists of poorly cohesive epithelioid and rhabdoid cells.  A panel of immunohistochemistry (p16, f, p53, PAX 8, SMA, CD10, CD45, CD 99, CD117, CD138, cytokeratin 7, cyclin D1, cytokeratin AE1/3, EMA and GATA3) is performed to better classify this neoplasm.  By immunohistochemistry, the neoplastic cells are positive for p16, p53 and  ?PAX 8.  The epithelioid cells are positive for cytokeratin AE1/3 and EMA while the rhabdoid cells show positivity for desmin. Overall, the immunophenotype and morphology supports the diagnosis of carcinosarcoma.  ? ? ?ONCOLOGY TABLE:  ? ?UTERUS, CARCINOMA OR CARCINOSARCOMA  ? ?Procedure: Total hysterectomy and bilateral salpingo-oophorectomy  ?Histologic type: Carcinosarcoma  ?Histologic Grade: FIGO grade 3  ?Myometrial invasion: Estimated to be less than 50%  ?Uterine Serosa Involvement: Not identified  ?Cervical stromal involvement: Not identified  ?Extent of involvement of other organs: Not identified  ?Lymphovascular invasion: Not identified  ?Regional Lymph Nodes:  ?     Examined:  2 Sentinel  ?                             3 non-sentinel  ?                              5 total  ?      Lymph nodes with metastasis: 0  ?      Isolated tumor cells (<0.2 mm): 0  ?      Micrometastasis:  (>0.2 mm and < 2.0 mm): 0  ?      Macrometastasis: (>2.0 mm): 0  ?      Extracapsular extension: N/A  ?Representative Tumor Block: B1  ?MMR / MSI testing: Will be ordered  ?Pathologic Stage Classification (pTNM, AJCC 8th edition):  pT1a, pN0  ?Comments: See above  ?  ?02/04/2020 Cancer Staging  ? Staging form: Corpus Uteri - Carcinoma and Carcinosarcoma, AJCC 8th Edition ?- Clinical stage from 02/04/2020: FIGO Stage IA (cT1a, cN0(sn), cM0) - Signed by Lafonda Mosses, MD on 02/12/2020 ? ?  ?03/06/2020 Imaging  ? Placement of single lumen port a cath via right internal jugular vein. The catheter tip lies at the cavo-atrial junction. A power injectable port a cath was placed and is ready for immediate use. ?  ?03/12/2020 -  Chemotherapy  ? The patient had carboplatin and taxol for chemotherapy treatment.  ? ?  ?04/16/2020 - 05/11/2020 Radiation Therapy  ? 04/16/2020 through 05/11/2020 ?Site Technique Total Dose (Gy) Dose per Fx (Gy) Completed Fx Beam Energies  ?Vagina: Pelvis HDR-brachy 30/30 6 5/5 Ir-192  ?  ? ?  ?08/10/2020 Imaging  ? No evidence of recurrent or metastatic carcinoma within the abdomen or pelvis. ?  ?Aortic Atherosclerosis (ICD10-I70.0). ?  ?08/24/2020 Procedure  ? Successful removal of implanted Port-A-Cath. ?  ? ? ?Interval History: ?She last saw Dr. Sondra Come in 06/2021.  ? ?Reports doing well.  Denies any abdominal or pelvic pain.  Has occasional bloating that seems dependent on what she eats.  Has occasional constipation, improves when she eats prunes.  Increased urinary frequency, at baseline.  Denies any vaginal bleeding or discharge.  Reports good appetite without nausea or emesis. ? ?Lost her remaining sister on 09/20/2021.  She been in the memory care unit for many years. ? ?Past Medical/Surgical History: ?Past Medical History:  ?Diagnosis Date  ? Adenomatous colon polyp 11/2005  ?  Anginal pain ([place])   ? last time 2016  ? Anxiety   ? Arthritis   ? CAD (coronary artery disease)   ? 2V CABG 1996 (LIMA to LAD, SVG to RCA) Dr. Servando Snare.; last stress test in 2010  ? Diabetes mellitus without complication ([place])   ? type II  ? GERD (gastroesophageal reflux disease)   ? History of radiation therapy   ? Uterus- [place] brachytherapy - Dr. Gery Pray 04/16/20-05/11/20  ? Hyperlipidemia   ? Hypertension   ? Muscle cramps   ? and pains  ? Myocardial infarction [facility])   ? 1996  ? Pneumonia   ? hx of x 2   ? Ulcerative colitis   ? Uterine cancer ([place])   ? ? ?Past Surgical History:  ?Procedure Laterality Date  ? BACK SURGERY    ? lumbar fusion 25 years ago and ruptured disk 10 years ago same area  ? BACK SURGERY    ? ruptered disc under lumbar disc  ?  CARDIAC CATHETERIZATION N/A 10/13/2014  ? Procedure: Left Heart Cath And Coronary Angiography;  Surgeon: Burnell Blanks, MD;  Location: [place] INVASIVE CV LAB CUPID;  Service: Cardiovascular;  Laterality: N/A;  ? CARDIAC CATHETERIZATION N/A 10/13/2014  ? Procedure: Coronary Stent Intervention;  Surgeon: Burnell Blanks, MD;  Location: [place] INVASIVE CV LAB CUPID;  Service: Cardiovascular;  Laterality: N/A;  ? COLONOSCOPY    ? CORONARY ANGIOPLASTY WITH STENT PLACEMENT  10/13/2014  ? DES to [place]  ? CORONARY ARTERY BYPASS GRAFT  1996  ? x 2  ? DILATATION & CURETTAGE/HYSTEROSCOPY WITH MYOSURE N/A 01/03/2020  ? Procedure: DILATATION & CURETTAGE/HYSTEROSCOPY WITH POSSIBLE MYOSURE;  Surgeon: Paula Compton, MD;  Location: [place];  Service: Gynecology;  Laterality: N/A;  ? DILATION AND EVACUATION N/A 01/03/2020  ? Procedure: DILATATION AND EVACUATION;  Surgeon: Paula Compton, MD;  Location: [place];  Service: Gynecology;  Laterality: N/A;  ? IR IMAGING GUIDED PORT INSERTION  03/06/2020  ? IR REMOVAL TUN ACCESS W/ PORT W/O FL MOD SED  08/24/2020  ? OPERATIVE ULTRASOUND N/A 01/03/2020  ? Procedure: OPERATIVE ULTRASOUND;  Surgeon: Paula Compton, MD;   Location: [place];  Service: Gynecology;  Laterality: N/A;  ? POLYPECTOMY    ? ROBOTIC ASSISTED TOTAL HYSTERECTOMY WITH BILATERAL SALPINGO OOPHERECTOMY Bilateral 02/04/2020  ? Procedure: XI ROBOTIC ASSISTED TOTAL HYSTERE

## 2021-09-27 NOTE — Patient Instructions (Signed)
It was good to see you today.  I do not see or feel any evidence of cancer recurrence on your exam. ? ?We will continue with visits every 3 months alternating between our office and radiation oncology.  I will see you back in mid October.  My schedule is not out past the summer.  Please call back sometime in August or September to get this visit scheduled with me. ? ?As always, if you develop any new and concerning symptoms before your next visit, please call to see me sooner. ?

## 2021-12-22 ENCOUNTER — Encounter: Payer: Self-pay | Admitting: Gastroenterology

## 2022-01-02 NOTE — Progress Notes (Signed)
Radiation Oncology         [phone] ________________________________  Name: [name] [name] MRN: [id number]  Date: 01/03/2022  DOB: 03-15-1946  Follow-Up Visit Note  CC: [name], MD  [name], MD    ICD-10-CM   1. 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[place])  C55       Diagnosis:  Early stage carcinosarcoma   Cancer Staging  Carcinosarcoma of uterus [facility]) Staging form: Corpus Uteri - Carcinoma and Carcinosarcoma, AJCC 8th Edition - Clinical stage from 02/04/2020: FIGO Stage IA (cT1a, cN0(sn), cM0) - Signed by Lafonda Mosses, MD on 02/12/2020  Interval Since Last Radiation: 1 year, 7 months, and 26 days   Radiation Treatment Dates: 04/16/2020 through 05/11/2020 Site Technique Total Dose (Gy) Dose per Fx (Gy) Completed Fx Beam Energies  Vagina: Pelvis HDR-brachy 30/30 6 5/5 Ir-192    Narrative:  The patient returns today for routine 6 month follow-up. She was last seen here for follow-up on 07/05/21. Since her last visit, the patient followed up with Dr. Berline Lopes on 10/27/21. During which time, the patient reported occasional bloating and constipation related to eating, and increased urinary frequency at baseline. She otherwise denied any symptoms concerning for disease recurrence and was noted as NED on examination. The patient was also noted to report using her vaginal dilator 3 times.     Otherwise, no significant interval history since the patient was last seen for follow up.  She does report one of her daughters is in the final stages of colon cancer.  She continues to use her vaginal dilator as recommended.  She denies any bleeding with use.  She denies any vaginal bleeding or discharge.  She denies any hematuria or rectal bleeding or abdominal bloating.  She does occasionally report some dizziness at times related to her blood pressure medication.                           Allergies:  has No Known Allergies.  Meds: Current Outpatient Medications  Medication Sig  Dispense Refill   calcium carbonate (OS-CAL) 600 MG TABS tablet Take 600 mg by mouth daily with breakfast.     carvedilol (COREG) 3.125 MG tablet Take 1 tablet (3.125 mg total) by mouth 2 (two) times daily. 180 tablet 1   Cholecalciferol (VITAMIN D) 50 MCG (2000 UT) tablet Take 2,000 Units by mouth daily.     clopidogrel (PLAVIX) 75 MG tablet Take 1 tablet (75 mg total) by mouth daily. 90 tablet 1   FLUoxetine (PROZAC) 10 MG capsule Take 10 mg by mouth daily.      LORazepam (ATIVAN) 1 MG tablet Take 1 tablet (1 mg total) by mouth every 8 (eight) hours as needed for anxiety (for anxiety). 60 tablet 1   mesalamine (APRISO) 0.375 g 24 hr capsule Take 4 capsules (1.5 g total) by mouth daily. 360 capsule 3   metFORMIN (GLUCOPHAGE-XR) 500 MG 24 hr tablet Take 500 mg by mouth daily with supper.   5   Multiple Vitamins-Minerals (MULTIVITAMIN WITH MINERALS) tablet Take 1 tablet by mouth daily.     nitroGLYCERIN (NITROSTAT) 0.4 MG SL tablet Place 1 tablet (0.4 mg total) under the tongue every 5 (five) minutes as needed for chest pain. 25 tablet 3   olmesartan-hydrochlorothiazide (BENICAR HCT) 40-12.5 MG tablet Take 1 tablet by mouth daily.     pantoprazole (PROTONIX) 40 MG tablet Take 1 tablet (40 mg total) by mouth daily. 90 tablet 1  rosuvastatin (CRESTOR) 20 MG tablet Take 1 tablet (20 mg total) by mouth daily. 90 tablet 3   No current facility-administered medications for this encounter.    Physical Findings: The patient is in no acute distress. Patient is alert and oriented.  height is 5' 6"  (1.676 m) and weight is 162 lb 12.8 oz (73.8 kg). Her temperature is 97.3 F (36.3 C) (abnormal). Her blood pressure is 126/63 and her pulse is 73. Her respiration is 18 and oxygen saturation is 99%. .  No significant changes. Lungs are clear to auscultation bilaterally. Heart has regular rate and rhythm. No palpable cervical, supraclavicular, or axillary adenopathy. Abdomen soft, non-tender, normal bowel  sounds.  On pelvic examination the external genitalia were unremarkable. A speculum exam was performed. There are no mucosal lesions noted in the vaginal vault.  On bimanual and rectovaginal examination there were no pelvic masses appreciated.  radiation changes are noted in the proximal vagina and this area bleeds easily with speculum exam.  Rectal sphincter tone good  Lab Findings: Lab Results  Component Value Date   WBC 3.9 (L) 08/24/2020   HGB 10.5 (L) 08/24/2020   HCT 30.7 (L) 08/24/2020   MCV 101.0 (H) 08/24/2020   PLT 188 08/24/2020    Radiographic Findings: No results found.  Impression: Early stage carcinosarcoma  No evidence of recurrence on clinical exam today.  Encouraged the patient to continue using her vaginal dilator  Plan: Routine follow-up in 6 months.  She will see Dr. Berline Lopes in 3 months.   23 minutes of total time was spent for this patient encounter, including preparation, face-to-face counseling with the patient and coordination of care, physical exam, and documentation of the encounter. ____________________________________  Blair Promise, PhD, MD  This document serves as a record of services personally performed by Gery Pray, MD. It was created on his behalf by Roney Mans, a trained medical scribe. The creation of this record is based on the scribe's personal observations and the provider's statements to them. This document has been checked and approved by the attending provider.

## 2022-01-03 ENCOUNTER — Encounter: Payer: Self-pay | Admitting: Radiation Oncology

## 2022-01-03 ENCOUNTER — Ambulatory Visit
Admission: RE | Admit: 2022-01-03 | Discharge: 2022-01-03 | Disposition: A | Discharge status: Home / Self Care | Payer: Medicare Other | Source: Ambulatory Visit | Attending: Radiation Oncology | Admitting: Radiation Oncology

## 2022-01-03 ENCOUNTER — Other Ambulatory Visit: Payer: Self-pay

## 2022-01-03 DIAGNOSIS — Z79899 Other long term (current) drug therapy: Secondary | ICD-10-CM | POA: Diagnosis not present

## 2022-01-03 DIAGNOSIS — Z923 Personal history of irradiation: Secondary | ICD-10-CM | POA: Diagnosis not present

## 2022-01-03 DIAGNOSIS — Z7984 Long term (current) use of oral hypoglycemic drugs: Secondary | ICD-10-CM | POA: Diagnosis not present

## 2022-01-03 DIAGNOSIS — Z8542 Personal history of malignant neoplasm of other parts of uterus: Secondary | ICD-10-CM | POA: Insufficient documentation

## 2022-01-03 DIAGNOSIS — Z7902 Long term (current) use of antithrombotics/antiplatelets: Secondary | ICD-10-CM | POA: Diagnosis not present

## 2022-01-03 DIAGNOSIS — C55 Malignant neoplasm of uterus, part unspecified: Secondary | ICD-10-CM

## 2022-01-03 NOTE — Progress Notes (Signed)
[name] [name] is here today for follow up post radiation to the pelvic.  They completed their radiation on: 05/11/20  Does the patient complain of any of the following:  Pain: Reports burning to pelvic area that doesn't last long. Abdominal bloating: No Diarrhea/Constipation: Diarrhea, takes stool softeners BID Nausea/Vomiting: No Vaginal Discharge: No Blood in Urine or Stool: NO  Urinary Issues (dysuria/incomplete emptying/ incontinence/ increased frequency/urgency): No Does patient report using vaginal dilator 2-3 times a week and/or sexually active 2-3 weeks: Yes.  Post radiation skin changes: No   Additional comments if applicable:   Patient reports having dizzy spells at times.    BP 126/63 (BP Location: Left Arm, Patient Position: Sitting, Cuff Size: Normal)   Pulse 73   Temp (!) 97.3 F (36.3 C)   Resp 18   Ht 5' 6"  (1.676 m)   Wt 162 lb 12.8 oz (73.8 kg)   SpO2 99%   BMI 26.28 kg/m

## 2022-01-10 ENCOUNTER — Telehealth: Payer: Self-pay

## 2022-01-10 ENCOUNTER — Telehealth: Payer: Self-pay | Admitting: Cardiovascular Disease

## 2022-01-10 ENCOUNTER — Ambulatory Visit: Payer: Medicare Other | Admitting: Gastroenterology

## 2022-01-10 ENCOUNTER — Encounter: Payer: Self-pay | Admitting: Gastroenterology

## 2022-01-10 VITALS — BP 90/50 | HR 77 | Ht 66.0 in | Wt 163.0 lb

## 2022-01-10 DIAGNOSIS — K5150 Left sided colitis without complications: Secondary | ICD-10-CM | POA: Diagnosis not present

## 2022-01-10 DIAGNOSIS — Z7902 Long term (current) use of antithrombotics/antiplatelets: Secondary | ICD-10-CM

## 2022-01-10 MED ORDER — NA SULFATE-K SULFATE-MG SULF 17.5-3.13-1.6 GM/177ML PO SOLN: 1.0000 | Freq: Once | ORAL | 0 refills | Status: AC

## 2022-01-10 NOTE — Progress Notes (Signed)
    Assessment     Ulcerative colitis, left sided, well controlled, due for surveillance colonoscopy  GERD, well controlled BP=90/50   Recommendations    Continue Apriso 1.5g qd  For her low BP her Cardiologist office advised her to hold [place] until her Cardiologist reviews  Schedule colonoscopy. The risks (including bleeding, perforation, infection, missed lesions, medication reactions and possible hospitalization or surgery if complications occur), benefits, and alternatives to colonoscopy with possible biopsy and possible polypectomy were discussed with the patient and they consent to proceed.   4.   Hold Plavix 5 days before procedure - will instruct when and how to resume after procedure. Low but real risk of cardiovascular event such as heart attack, stroke, embolism, thrombosis or ischemia/infarct of other organs off Plavix explained and need to seek urgent help if this occurs. The patient consents to proceed. Will communicate by phone or EMR with patient's prescribing provider to confirm that holding Plavix is reasonable in this case.   5.    Continue pantoprazole 40 mg daily and follow antireflux measures   HPI    This is a 76 year old female with ulcerative colitis.  She relates ongoing difficulties with alternating constipation and diarrhea.  The symptoms have not changed in frequency or severity.  She states for the past several weeks she has had very little energy.  Her reflux symptoms are well controlled.  She moved to [place], [place] and her new PCP is Alphia Kava, MD.   Labs / Imaging       Latest Ref Rng & Units 07/12/2021    3:48 PM 08/10/2020    9:45 AM 07/13/2020   10:05 AM  Hepatic Function  Total Protein 6.0 - 8.5 g/dL 6.6  7.2  6.8   Albumin 3.7 - 4.7 g/dL 4.3  4.2  3.9   AST 0 - 40 IU/L 15  18  17    ALT 0 - 32 IU/L 12  9  10    Alk Phosphatase 44 - 121 IU/L 75  64  60   Total Bilirubin 0.0 - 1.2 mg/dL 0.4  0.7  0.7   Bilirubin, Direct 0.00 - 0.40  mg/dL 0.13       Current Medications, Allergies, Past Medical History, Past Surgical History, Family History and Social History were reviewed in [facility] record.   Physical Exam: General: Well developed, well nourished, no acute distress Head: Normocephalic and atraumatic Eyes: Sclerae anicteric, EOMI Ears: Normal auditory acuity Mouth: Not examined Lungs: Clear throughout to auscultation Heart: Regular rate and rhythm; no murmurs, rubs or bruits Abdomen: Soft, non tender and non distended. No masses, hepatosplenomegaly or hernias noted. Normal Bowel sounds Rectal: Deferred to colonoscopy Musculoskeletal: Symmetrical with no gross deformities  Pulses:  Normal pulses noted Extremities: No clubbing, cyanosis, edema or deformities noted Neurological: Alert oriented x 4, grossly nonfocal Psychological:  Alert and cooperative. Normal mood and affect   Tijuan Dantes T. Fuller Plan, MD 01/10/2022, 2:50 PM

## 2022-01-10 NOTE — Telephone Encounter (Signed)
Pt c/o BP issue: STAT if pt c/o blurred vision, one-sided weakness or slurred speech  1. What are your last 5 BP readings? 88/50; 90/50  2. Are you having any other symptoms (ex. Dizziness, headache, blurred vision, passed out)? syncope  3. What is your BP issue? Experiencing very low BP. Had a syncope episode on 7/28

## 2022-01-10 NOTE — Telephone Encounter (Signed)
    Primary Cardiologist:Christopher Angelena Form, MD  Chart reviewed as part of pre-operative protocol coverage. Because of Joory H Crotteau's past medical history and time since last visit, he/she will require a follow-up visit in order to better assess preoperative cardiovascular risk.  Pre-op covering staff: - Please schedule appointment and call patient to inform them. - Please contact requesting surgeon's office via preferred method (i.e, phone, fax) to inform them of need for appointment prior to surgery.  If applicable, this message will also be routed to pharmacy pool and/or primary cardiologist for input on holding anticoagulant/antiplatelet agent as requested below so that this information is available at time of patient's appointment.   Deberah Pelton, NP  01/10/2022, 3:45 PM

## 2022-01-10 NOTE — Telephone Encounter (Signed)
FYI

## 2022-01-10 NOTE — Telephone Encounter (Signed)
[facility] Pre-operative Risk Assessment     Request for surgical clearance:     Endoscopy Procedure  What type of surgery is being performed?     colonoscopy  When is this surgery scheduled?     02/09/22  What type of clearance is required ?   Pharmacy  Are there any medications that need to be held prior to surgery and how long? Plavix x 5 days  Practice name and name of physician performing surgery?      [facility]  What is your office phone and fax number?      Phone- [phone]  [phone]  Anesthesia type (None, local, MAC, general) ?       MAC

## 2022-01-10 NOTE — Telephone Encounter (Signed)
Returned call to patient who states that she checks her BP at home (she lives in [place]), but is visiting her daughter currently and didn't pack her BP kit. States that Friday 7/28 she had been sitting and got up to check her daughter who has cancer and says "I hit the floor and I couldn't get up." Daughter heard and immediately helped mom into sitting position until she felt well enough to get herself up and into chair. Denies losing consciousness, but states she remembers placing hand on door handle, then hitting the floor. Condones feeling excessively tired lately, as well as, lightheaded with an unsteady "wobbly" gait. Nurse cam to house to check daughter and checked pt's BP while sitting: LA=88/50 RA=90/50 Pt states she fell "a few weeks ago" as well while at her other daughter's house. Says she's been monitoring pressure at home daily and cannot provide exact numbers at this time, but thought it was doing okay. States she has been staying hydrated and denies any medication changes. Attests to taking Coreg 3.168m BID and Benicar 40/12.5 QD. Advised pt to push fluids, change positions slowly, and hold both above mentioned medications until she hears back from [place] She has taken today's AM doses (including Benicar) already at time of call. Routing to primary MD for further advisement.

## 2022-01-10 NOTE — Patient Instructions (Addendum)
Have your pharmacy contact us for refills for your mesalamine.   You have been scheduled for a colonoscopy. Please follow written instructions given to you at your visit today.  Please pick up your prep supplies at the pharmacy within the next 1-3 days. If you use inhalers (even only as needed), please bring them with you on the day of your procedure.  The [facility] providers would like to encourage you to use [facility] to communicate with providers for non-urgent requests or questions.  Due to long hold times on the telephone, sending your provider a message by [facility] may be a faster and more efficient way to get a response.  Please allow 48 business hours for a response.  Please remember that this is for non-urgent requests.   Due to recent changes in healthcare laws, you may see the results of your imaging and laboratory studies on MyChart before your provider has had a chance to review them.  We understand that in some cases there may be results that are confusing or concerning to you. Not all laboratory results come back in the same time frame and the provider may be waiting for multiple results in order to interpret others.  Please give [place] 48 hours in order for your provider to thoroughly review all the results before contacting the office for clarification of your results.    Thank you for choosing me and [facility].  Pricilla Riffle. Dagoberto Ligas., MD., [facility] Regal

## 2022-01-10 NOTE — Telephone Encounter (Signed)
I s/w the pt and her daughter. Pt is agreeable to plan of care for in office appt 01/14/22 for pre op clearance with Melina Copa, PAC.

## 2022-01-10 NOTE — Telephone Encounter (Signed)
Reply from Dr. Angelena Form: Hold Coreg and Benicar this week and follow BP. She has f/u this week. Cdm   Spoke with the patient.  She will come to see Dr. Angelena Form Wednesday and will hold carvedilol and Benicar until she comes in.  She saw GI today and will need cardiac clearance for colonoscopy.

## 2022-01-12 ENCOUNTER — Encounter: Payer: Self-pay | Admitting: Cardiovascular Disease

## 2022-01-12 ENCOUNTER — Ambulatory Visit: Payer: Medicare Other | Admitting: Cardiovascular Disease

## 2022-01-12 VITALS — BP 118/68 | HR 78 | Ht 66.0 in | Wt 166.2 lb

## 2022-01-12 DIAGNOSIS — I2510 Atherosclerotic heart disease of native coronary artery without angina pectoris: Secondary | ICD-10-CM | POA: Diagnosis not present

## 2022-01-12 DIAGNOSIS — R42 Dizziness and giddiness: Secondary | ICD-10-CM

## 2022-01-12 DIAGNOSIS — I10 Essential (primary) hypertension: Secondary | ICD-10-CM | POA: Diagnosis not present

## 2022-01-12 DIAGNOSIS — E7800 Pure hypercholesterolemia, unspecified: Secondary | ICD-10-CM

## 2022-01-12 NOTE — Progress Notes (Addendum)
Chief Complaint  Patient presents with   Follow-up    CAD   History of Present Illness: 76 yo female with h/o CAD with acute anterior MI April 1996 with angioplasty, CABG June 1996 (LIMA to LAD, SVG to RCA), stenting Circumflex 2016, HLD, HTN, ulcerative colitis here today for cardiology followup. Last cardiac cath in May 2016 and she was found to have a severe stenosis in the mid Circumflex treated with a drug eluting stent. The LIMA was patent to the LAD and the SVG was patent to the PDA. Echo January 2019 with WJXB=14-78%, grade 2 diastolic dysfunction. She had uterine cancer and she has completed chemotherapy and radiation therapy in January 2022. She had a total hysterectomy.   She is here today for follow up. The patient denies any chest pain, dyspnea, palpitations, lower extremity edema, orthopnea, PND. She was at her daughters house five days ago and stood up and passed out for a few seconds. Her BP was low. We asked her to hold her Coreg and Benicar. She has felt a little better over the past few days but has still had some dizziness and weakness.   Primary Care Physician: Rolla Etienne, MD  Past Medical History:  Diagnosis Date   Adenomatous colon polyp 11/2005   Anginal pain ([place])    last time 2016   Anxiety    Arthritis    CAD (coronary artery disease)    2V CABG 1996 (LIMA to LAD, SVG to RCA) Dr. Servando Snare.; last stress test in 2010   Diabetes mellitus without complication ([place])    type II   GERD (gastroesophageal reflux disease)    History of radiation therapy    Uterus- [facility] brachytherapy - Dr. Gery Pray 04/16/20-05/11/20   Hyperlipidemia    Hypertension    Muscle cramps    and pains   Myocardial infarction ([place])    1996   Pneumonia    hx of x 2    Ulcerative colitis    Uterine cancer ([place])     Past Surgical History:  Procedure Laterality Date   BACK SURGERY     lumbar fusion 25 years ago and ruptured disk 10 years ago same area   BACK SURGERY      ruptered disc under lumbar disc   CARDIAC CATHETERIZATION N/A 10/13/2014   Procedure: Left Heart Cath And Coronary Angiography;  Surgeon: Burnell Blanks, MD;  Location: [facility];  Service: Cardiovascular;  Laterality: N/A;   CARDIAC CATHETERIZATION N/A 10/13/2014   Procedure: Coronary Stent Intervention;  Surgeon: Burnell Blanks, MD;  Location: [facility];  Service: Cardiovascular;  Laterality: N/A;   COLONOSCOPY     CORONARY ANGIOPLASTY WITH STENT PLACEMENT  10/13/2014   DES to [place]   x 2   [place] N/A 01/03/2020   Procedure: DILATATION & CURETTAGE/HYSTEROSCOPY WITH POSSIBLE MYOSURE;  Surgeon: Paula Compton, MD;  Location: [facility];  Service: Gynecology;  Laterality: N/A;   DILATION AND EVACUATION N/A 01/03/2020   Procedure: DILATATION AND EVACUATION;  Surgeon: Paula Compton, MD;  Location: [facility];  Service: Gynecology;  Laterality: N/A;   IR IMAGING GUIDED PORT INSERTION  03/06/2020   IR REMOVAL TUN ACCESS W/ PORT W/O FL MOD SED  08/24/2020   OPERATIVE ULTRASOUND N/A 01/03/2020   Procedure: OPERATIVE ULTRASOUND;  Surgeon: Paula Compton, MD;  Location: [facility];  Service: Gynecology;  Laterality: N/A;  POLYPECTOMY     ROBOTIC ASSISTED TOTAL HYSTERECTOMY WITH BILATERAL SALPINGO OOPHERECTOMY Bilateral 02/04/2020   Procedure: XI ROBOTIC ASSISTED TOTAL HYSTERECTOMY WITH BILATERAL SALPINGO OOPHORECTOMY, LYMPH NODE DISSECTION;  Surgeon: Lafonda Mosses, MD;  Location: [facility];  Service: Gynecology;  Laterality: Bilateral;   SENTINEL NODE BIOPSY N/A 02/04/2020   Procedure: SENTINEL NODE BIOPSY;  Surgeon: Lafonda Mosses, MD;  Location: [facility];  Service: Gynecology;  Laterality: N/A;   UPPER GASTROINTESTINAL ENDOSCOPY      Current Outpatient Medications  Medication Sig Dispense Refill   calcium carbonate (OS-CAL) 600 MG TABS tablet Take 600 mg by mouth daily with  breakfast.     Cholecalciferol (VITAMIN D) 50 MCG (2000 UT) tablet Take 2,000 Units by mouth daily.     clopidogrel (PLAVIX) 75 MG tablet Take 1 tablet (75 mg total) by mouth daily. 90 tablet 1   FLUoxetine (PROZAC) 10 MG capsule Take 10 mg by mouth daily.      LORazepam (ATIVAN) 1 MG tablet Take 1 tablet (1 mg total) by mouth every 8 (eight) hours as needed for anxiety (for anxiety). 60 tablet 1   mesalamine (APRISO) 0.375 g 24 hr capsule Take 4 capsules (1.5 g total) by mouth daily. 360 capsule 3   Multiple Vitamins-Minerals (MULTIVITAMIN WITH MINERALS) tablet Take 1 tablet by mouth daily.     nitroGLYCERIN (NITROSTAT) 0.4 MG SL tablet Place 1 tablet (0.4 mg total) under the tongue every 5 (five) minutes as needed for chest pain. 25 tablet 3   pantoprazole (PROTONIX) 40 MG tablet Take 1 tablet (40 mg total) by mouth daily. 90 tablet 1   rosuvastatin (CRESTOR) 20 MG tablet Take 1 tablet (20 mg total) by mouth daily. 90 tablet 3   No current facility-administered medications for this visit.    No Known Allergies  Social History   Socioeconomic History   Marital status: Widowed    Spouse name: Ray   Number of children: 7   Years of education: 12   Highest education level: Not on file  Occupational History   Occupation: retired  Tobacco Use   Smoking status: Former    Years: 20.00    Types: Cigarettes    Quit date: 10/04/1994    Years since quitting: 27.2   Smokeless tobacco: Never  Vaping Use   Vaping Use: Never used  Substance and Sexual Activity   Alcohol use: No   Drug use: No   Sexual activity: Not Currently    Partners: Male  Other Topics Concern   Not on file  Social History Narrative   Not on file   Social Determinants of Health   Financial Resource Strain: Not on file  Food Insecurity: Not on file  Transportation Needs: Not on file  Physical Activity: Not on file  Stress: Not on file  Social Connections: Not on file  Intimate Partner Violence: Not on file     Family History  Problem Relation Age of Onset   Diabetes Mother    Heart disease Mother    Heart attack Mother 38   Hypertension Mother    Lung cancer Brother    Colon cancer Brother    Breast cancer Sister    Kidney disease Brother        renal cancer   Heart disease Sister    Colon cancer Daughter        Thinks her daughter had negative genetic testing   Hypertension Sister    Stroke Sister  Pancreatitis Other    Breast cancer Daughter    Stomach cancer Neg Hx     Review of Systems:  As stated in the HPI and otherwise negative.   BP 118/68   Pulse 78   Ht 5' 6"  (1.676 m)   Wt 166 lb 3.2 oz (75.4 kg)   SpO2 98%   BMI 26.83 kg/m   Physical Examination:  General: Well developed, well nourished, NAD  HEENT: OP clear, mucus membranes moist  SKIN: warm, dry. No rashes. Neuro: No focal deficits  Musculoskeletal: Muscle strength 5/5 all ext  Psychiatric: Mood and affect normal  Neck: No JVD, no carotid bruits, no thyromegaly, no lymphadenopathy.  Lungs:Clear bilaterally, no wheezes, rhonci, crackles Cardiovascular: Regular rate and rhythm. No murmurs, gallops or rubs. Abdomen:Soft. Bowel sounds present. Non-tender.  Extremities: No lower extremity edema. Pulses are 2 + in the bilateral DP/PT.  Echo January 2019:  - Left ventricle: The cavity size was normal. Wall thickness was   normal. Systolic function was normal. The estimated ejection   fraction was in the range of 60% to 65%. Wall motion was normal;   there were no regional wall motion abnormalities. Features are   consistent with a pseudonormal left ventricular filling pattern,   with concomitant abnormal relaxation and increased filling   pressure (grade 2 diastolic dysfunction).  Cardiac cath Oct 13, 2014: Left main: Diffuse 20% stenosis.   Left Anterior Descending Artery: Large caliber vessel that courses to the apex. The proximal vessel is occluded. The mid and distal vessel fills from the patent  graft.   Circumflex Artery: Moderate caliber vessel with moderate caliber obtuse marginal branch. The mid vessel has a 99% stenosis.   Right Coronary Artery: Large dominant vessel with 100% mid occlusion. The distal vessel fills from the patent vein graft.  Graft Anatomy:  SVG to PDA is patent LIMA to mid LAD is patent Left Ventricular Angiogram: LVEF=60-65%.   EKG:  EKG is  ordered today. The ekg ordered today demonstrates  Sinus  Recent Labs: 07/12/2021: ALT 12   Lipid Panel    Component Value Date/Time   CHOL 174 07/12/2021 1548   TRIG 123 07/12/2021 1548   HDL 55 07/12/2021 1548   CHOLHDL 3.2 07/12/2021 1548   CHOLHDL 3.0 03/24/2015 0938   VLDL 12 03/24/2015 0938   LDLCALC 97 07/12/2021 1548     Wt Readings from Last 3 Encounters:  01/12/22 166 lb 3.2 oz (75.4 kg)  01/10/22 163 lb (73.9 kg)  01/03/22 162 lb 12.8 oz (73.8 kg)     Assessment and Plan:   1. CAD without angina: She is s/p 2V CABG in 1996. Last cardiac cath May 2016 at which time a DES was placed in the mid Circumflex. LIMA graft was patent to the LAD and the SVG was patent to the PDA. Echo January 2019 with normal LV function. No chest pain suggestive of angina. She is having weakness and dizziness. Will stop Coreg and Benicar. Continue Plavix, statin. Will arrange a nuclear stress test and an echo when she calls back. See below.      2. HTN: BP is controlled. No changes today  3. Hyperlipidemia: Lipids followed in primary care. Continue statin.       4. Dizziness: May be related to hypotension. Will stop Coreg and Benicar. These were held 48 hours ago and BP is ok today. EKG is normal. I have suggested an echo and a stress test. She will call back to arrange  this. Her daughter is dying of colon cancer and she is staying with her.   Addendum: 03/01/22: Stress test and echo are ok. She can proceed with planning for her procedure without further cardiac workup.   Current medicines are reviewed at length with  the patient today.  The patient does not have concerns regarding medicines.  The following changes have been made:  no change  Labs/ tests ordered today include:  Orders Placed This Encounter  Procedures   EKG 12-Lead   Disposition:   Follow up with me in six months  Signed, Lauree Chandler, MD 01/12/2022 4:01 PM    [facility] [place], [place],   [address] Phone: [phone]; Fax: [phone]

## 2022-01-12 NOTE — Patient Instructions (Addendum)
Medication Instructions:  Your physician has recommended you make the following change in your medication:  1.) stop carvedilol 2.) stop olmesartan  *If you need a refill on your cardiac medications before your next appointment, please call your pharmacy*   Lab Work: none If you have labs (blood work) drawn today and your tests are completely normal, you will receive your results only by: [place] (if you have MyChart) OR A paper copy in the mail If you have any lab test that is abnormal or we need to change your treatment, we will call you to review the results.   Testing/Procedures: none   Follow-Up: At [facility], you and your health needs are our priority.  As part of our continuing mission to provide you with exceptional heart care, we have created designated Provider Care Teams.  These Care Teams include your primary Cardiologist (physician) and Advanced Practice Providers (APPs -  Physician Assistants and Nurse Practitioners) who all work together to provide you with the care you need, when you need it.  Your next appointment:   6 month(s)  The format for your next appointment:   In Person  Provider:   Lauree Chandler, MD     Other Instructions   Important Information About [place]

## 2022-01-14 ENCOUNTER — Ambulatory Visit: Payer: Medicare Other | Admitting: Physician Assistant

## 2022-01-14 NOTE — Telephone Encounter (Signed)
Please advise of patient is cleared for procedure on 02/09/22 or further work up is needed for dizziness prior to clearance.

## 2022-01-14 NOTE — Telephone Encounter (Signed)
I am going to forward to pre op pool as FYI as well. Pt moved her appt, but my appt notes where never changed over to the new appt.   Please see notes from Dr. Angelena Form.

## 2022-01-17 NOTE — Telephone Encounter (Signed)
Per Dr. Angelena Form, echo and stress test are needed prior to providing clearance for procedure. Patient informed him that she would call back to schedule these as she has a family emergency to deal with at this time.  Will fax report to [facility] and remove from the preop pool.   Emmaline Life, NP-C    01/17/2022, 11:41 AM [place] [address] Office [phone] Fax [phone]

## 2022-01-18 NOTE — Telephone Encounter (Signed)
Spoke with pt regarding surgical clearance and the need for Echocardiogram & Stress Test before clearance can be given. Pt is having a family emergency and she will call us back when se is ready to have the testing done.  Pt is aware that she can't be cleared for the Colonoscopy and she understands.  Of course, what she's dealing with, is priority over this.    Pt will call back when she is ready.  I will route back to the requesting surgeon's office to make them aware.

## 2022-01-18 NOTE — Telephone Encounter (Signed)
Please see cardiology's recommendations. Colonoscopy cancelled at this time until cardiology work up is complete.

## 2022-01-31 ENCOUNTER — Telehealth: Payer: Self-pay | Admitting: Cardiovascular Disease

## 2022-01-31 DIAGNOSIS — Z01818 Encounter for other preprocedural examination: Secondary | ICD-10-CM

## 2022-01-31 DIAGNOSIS — I2510 Atherosclerotic heart disease of native coronary artery without angina pectoris: Secondary | ICD-10-CM

## 2022-01-31 DIAGNOSIS — R42 Dizziness and giddiness: Secondary | ICD-10-CM

## 2022-01-31 NOTE — Telephone Encounter (Signed)
Echo and nuclear stress test have been scheduled for 02/10/22.

## 2022-01-31 NOTE — Telephone Encounter (Signed)
  Patient sent a message through MyChart requesting to schedule a echo and a stress test but I do not see any orders in Epic. Does this need to be done?

## 2022-01-31 NOTE — Telephone Encounter (Signed)
Orders placed for echo and nuclear stress test.  Message sent back to scheduler - okay to arrange these if patient is ready to schedule.

## 2022-02-07 ENCOUNTER — Telehealth (HOSPITAL_COMMUNITY): Payer: Self-pay

## 2022-02-07 NOTE — Telephone Encounter (Signed)
Spoke with the patient, detailed instructions given. She stated that she would be here for her test. Asked to call back with any questions. S.Shakeria Robinette EMTP 

## 2022-02-09 ENCOUNTER — Encounter: Payer: Medicare Other | Admitting: Gastroenterology

## 2022-02-10 ENCOUNTER — Ambulatory Visit (HOSPITAL_COMMUNITY): Payer: Medicare Other

## 2022-02-10 ENCOUNTER — Encounter (HOSPITAL_COMMUNITY): Payer: Self-pay | Admitting: Emergency Medicine

## 2022-02-10 ENCOUNTER — Ambulatory Visit (HOSPITAL_COMMUNITY): Payer: Medicare Other | Attending: Internal Medicine

## 2022-02-10 ENCOUNTER — Emergency Department (HOSPITAL_COMMUNITY)
Admission: EM | Admit: 2022-02-10 | Discharge: 2022-02-11 | Disposition: A | Discharge status: Home / Self Care | Payer: Medicare Other | Attending: Emergency Medicine | Admitting: Emergency Medicine

## 2022-02-10 DIAGNOSIS — Z79899 Other long term (current) drug therapy: Secondary | ICD-10-CM | POA: Diagnosis not present

## 2022-02-10 DIAGNOSIS — I10 Essential (primary) hypertension: Secondary | ICD-10-CM | POA: Diagnosis not present

## 2022-02-10 DIAGNOSIS — R42 Dizziness and giddiness: Secondary | ICD-10-CM | POA: Diagnosis present

## 2022-02-10 NOTE — ED Triage Notes (Addendum)
Pt states she had 1 episode of dizziness and hypotension 2 weeks ago and her cardiologist told her to stop taking her HTN medications.   Pt states over the past few days she noticed that her BP was slowly elevating. Pt checked her BP tonight at home due to having a severe headache and her BP was 197/94. Pt has not restarted taking her HTN medications.   Pt also adds that she has been under a lot of stress helping to take care of her daughter that is actively dying with cancer.

## 2022-02-11 ENCOUNTER — Telehealth: Payer: Self-pay | Admitting: Cardiovascular Disease

## 2022-02-11 ENCOUNTER — Emergency Department (HOSPITAL_COMMUNITY): Payer: Medicare Other

## 2022-02-11 LAB — CBC WITH DIFFERENTIAL/PLATELET
Abs Immature Granulocytes: 0.02 10*3/uL (ref 0.00–0.07)
Basophils Absolute: 0 10*3/uL (ref 0.0–0.1)
Basophils Relative: 0 %
Eosinophils Absolute: 0 10*3/uL (ref 0.0–0.5)
Eosinophils Relative: 1 %
HCT: 34.1 % — ABNORMAL LOW (ref 36.0–46.0)
Hemoglobin: 11.5 g/dL — ABNORMAL LOW (ref 12.0–15.0)
Immature Granulocytes: 0 %
Lymphocytes Relative: 32 %
Lymphs Abs: 2.1 10*3/uL (ref 0.7–4.0)
MCH: 32 pg (ref 26.0–34.0)
MCHC: 33.7 g/dL (ref 30.0–36.0)
MCV: 95 fL (ref 80.0–100.0)
Monocytes Absolute: 0.3 10*3/uL (ref 0.1–1.0)
Monocytes Relative: 5 %
Neutro Abs: 4 10*3/uL (ref 1.7–7.7)
Neutrophils Relative %: 62 %
Platelets: 203 10*3/uL (ref 150–400)
RBC: 3.59 MIL/uL — ABNORMAL LOW (ref 3.87–5.11)
RDW: 14.6 % (ref 11.5–15.5)
WBC: 6.5 10*3/uL (ref 4.0–10.5)
nRBC: 0 % (ref 0.0–0.2)

## 2022-02-11 LAB — BASIC METABOLIC PANEL
Anion gap: 8 (ref 5–15)
BUN: 12 mg/dL (ref 8–23)
CO2: 27 mmol/L (ref 22–32)
Calcium: 9.1 mg/dL (ref 8.9–10.3)
Chloride: 106 mmol/L (ref 98–111)
Creatinine, Ser: 0.94 mg/dL (ref 0.44–1.00)
GFR, Estimated: >60 mL/min (ref >60)
Glucose, Bld: 113 mg/dL — ABNORMAL HIGH (ref 70–99)
Potassium: 3.4 mmol/L — ABNORMAL LOW (ref 3.5–5.1)
Sodium: 141 mmol/L (ref 135–145)

## 2022-02-11 MED ORDER — ACETAMINOPHEN 325 MG PO TABS
650.0000 mg | ORAL_TABLET | Freq: Once | ORAL | Status: AC
  Administered 2022-02-11: 650 mg via ORAL
  Filled 2022-02-11: qty 2

## 2022-02-11 NOTE — Telephone Encounter (Signed)
Pt called to repot that she was at the ED at [facility] last night for a BP of 197/94... they added back Losartan/ HCTZ 50/12.5 mg daily... her BP today is 156/78... she is feeling well except for feeling tired from being in the ED until 2 am... she will check her BP a few hours after taking her meds the next several days and she will call Dr Angelena Form next week with her readings.

## 2022-02-11 NOTE — Discharge Instructions (Signed)
Please restart taking your Benicar and continue to monitor your BP. Discuss further changes with Dr. Angelena Form

## 2022-02-11 NOTE — Telephone Encounter (Signed)
Left a message for the pt to call back.  

## 2022-02-11 NOTE — Telephone Encounter (Signed)
Patient is returning call. Please advise.

## 2022-02-11 NOTE — Telephone Encounter (Signed)
Pt c/o BP issue: STAT if pt c/o blurred vision, one-sided weakness or slurred speech  1. What are your last 5 BP readings? 164/137; 197/94  2. Are you having any other symptoms (ex. Dizziness, headache, blurred vision, passed out)? Headaches  3. What is your BP issue? Too high    Patient states she went to the hospital last night and the er dr put her back on the Losartan. Please advise

## 2022-02-11 NOTE — ED Provider Notes (Signed)
[facility] EMERGENCY DEPARTMENT  Provider Note  CSN: [id number] Arrival date & time: 02/10/22 2319  History Chief Complaint  Patient presents with   Hypertension    [name] [name] is a 76 y.o. female with history of HTN was previously on Coreg and Benicar, but those were both stopped at Cardiology office visit 8/2 after an episode of dizziness any hypotension. She reports her BP has gradually been getting worse over the course of the month. She began having a headache this week that has gotten increasingly worse as well. No focal neuro symptoms, chest pains or SOB.    Home Medications Prior to Admission medications   Medication Sig Start Date End Date Taking? Authorizing Provider  calcium carbonate (OS-CAL) 600 MG TABS tablet Take 600 mg by mouth daily with breakfast.    [provider]  Cholecalciferol (VITAMIN D) 50 MCG (2000 UT) tablet Take 2,000 Units by mouth daily.    [provider]  clopidogrel (PLAVIX) 75 MG tablet Take 1 tablet (75 mg total) by mouth daily. 02/23/21   Burnell Blanks, MD  FLUoxetine (PROZAC) 10 MG capsule Take 10 mg by mouth daily.  12/04/13   [provider]  LORazepam (ATIVAN) 1 MG tablet Take 1 tablet (1 mg total) by mouth every 8 (eight) hours as needed for anxiety (for anxiety). 05/14/20   Heath Lark, MD  mesalamine (APRISO) 0.375 g 24 hr capsule Take 4 capsules (1.5 g total) by mouth daily. 03/30/21   Ladene Artist, MD  Multiple Vitamins-Minerals (MULTIVITAMIN WITH MINERALS) tablet Take 1 tablet by mouth daily.    [provider]  nitroGLYCERIN (NITROSTAT) 0.4 MG SL tablet Place 1 tablet (0.4 mg total) under the tongue every 5 (five) minutes as needed for chest pain. 02/23/21   Burnell Blanks, MD  pantoprazole (PROTONIX) 40 MG tablet Take 1 tablet (40 mg total) by mouth daily. 02/23/21   Burnell Blanks, MD  rosuvastatin (CRESTOR) 20 MG tablet Take 1 tablet (20 mg total) by mouth daily. 07/13/21    Burnell Blanks, MD     Allergies    Patient has no known allergies.   Review of Systems   Review of Systems Please see HPI for pertinent positives and negatives  Physical Exam BP (!) 185/90   Pulse 84   Temp 97.7 F (36.5 C) (Oral)   Resp 13   Ht 5' 6"  (1.676 m)   Wt 75.4 kg   SpO2 96%   BMI 26.83 kg/m   Physical Exam Vitals and nursing note reviewed.  Constitutional:      Appearance: Normal appearance.  HENT:     Head: Normocephalic and atraumatic.     Nose: Nose normal.     Mouth/Throat:     Mouth: Mucous membranes are moist.  Eyes:     Extraocular Movements: Extraocular movements intact.     Conjunctiva/sclera: Conjunctivae normal.  Cardiovascular:     Rate and Rhythm: Normal rate.  Pulmonary:     Effort: Pulmonary effort is normal.     Breath sounds: Normal breath sounds.  Abdominal:     General: Abdomen is flat.     Palpations: Abdomen is soft.     Tenderness: There is no abdominal tenderness.  Musculoskeletal:        General: No swelling. Normal range of motion.     Cervical back: Neck supple.  Skin:    General: Skin is warm and dry.  Neurological:     General:  No focal deficit present.     Mental Status: She is alert and oriented to person, place, and time.     Cranial Nerves: No cranial nerve deficit.     Sensory: No sensory deficit.     Motor: No weakness.  Psychiatric:        Mood and Affect: Mood normal.     ED Results / Procedures / Treatments   EKG EKG Interpretation  Date/Time:  Thursday February 10 2022 23:50:45 EDT Ventricular Rate:  73 PR Interval:  159 QRS Duration: 88 QT Interval:  400 QTC Calculation: 441 R Axis:   82 Text Interpretation: Sinus rhythm Borderline right axis deviation Low voltage, precordial leads Probable anteroseptal infarct, old No significant change since last tracing Confirmed by Calvert Cantor [phone]) on 02/11/2022 1:30:08 AM  Procedures Procedures  Medications Ordered in the ED Medications   acetaminophen (TYLENOL) tablet 650 mg (650 mg Oral Given 02/11/22 0142)    Initial Impression and Plan  Patient here with elevated BP and headache. She has not been taking her BP meds for the last month on the recommendations of her cardiologist. Labs done in triage show unremarkable CBC and BMP. No signs of end organ damage. Will check head CT. She is requesting APAP for her headache.   ED Course   Clinical Course as of 02/11/22 0201  Fri Feb 11, 2022  0159 I personally viewed the images from radiology studies and agree with radiologist interpretation: CT is neg for acute process. Discussed restarting her Benicar only for the next few days, continue to monitor BP and discuss with Cardiology whether she needs to restart her Coreg as well.   [CS]    Clinical Course User Index [CS] Truddie Hidden, MD     MDM Rules/Calculators/A&P Medical Decision Making Problems Addressed: Hypertension, unspecified type: acute illness or injury  Amount and/or Complexity of Data Reviewed Labs: ordered. Radiology: ordered.    Final Clinical Impression(s) / ED Diagnoses Final diagnoses:  Hypertension, unspecified type    Rx / DC Orders ED Discharge Orders     None        Truddie Hidden, MD 02/11/22 0201

## 2022-02-18 ENCOUNTER — Telehealth (HOSPITAL_COMMUNITY): Payer: Self-pay | Admitting: *Deleted

## 2022-02-18 NOTE — Telephone Encounter (Signed)
Patient given detailed instructions per Myocardial Perfusion Study Information Sheet for the test on 02/25/2022 at 7:15. Patient notified to arrive 15 minutes early and that it is imperative to arrive on time for appointment to keep from having the test rescheduled.  If you need to cancel or reschedule your appointment, please call the office within 24 hours of your appointment. . Patient verbalized understanding.[name] [name]

## 2022-02-25 ENCOUNTER — Ambulatory Visit (HOSPITAL_COMMUNITY): Payer: Medicare Other | Attending: Cardiovascular Disease

## 2022-02-25 ENCOUNTER — Ambulatory Visit (HOSPITAL_BASED_OUTPATIENT_CLINIC_OR_DEPARTMENT_OTHER): Payer: Medicare Other

## 2022-02-25 DIAGNOSIS — Z01818 Encounter for other preprocedural examination: Secondary | ICD-10-CM | POA: Diagnosis present

## 2022-02-25 DIAGNOSIS — R42 Dizziness and giddiness: Secondary | ICD-10-CM | POA: Diagnosis present

## 2022-02-25 DIAGNOSIS — I2510 Atherosclerotic heart disease of native coronary artery without angina pectoris: Secondary | ICD-10-CM | POA: Insufficient documentation

## 2022-02-25 LAB — MYOCARDIAL PERFUSION IMAGING
LV dias vol: 47 mL (ref 46–106)
LV sys vol: 18 mL
Nuc Stress EF: 62 %
Peak HR: 105 {beats}/min
Rest HR: 84 {beats}/min
Rest Nuclear Isotope Dose: 10.4 mCi
SDS: 0
SRS: 0
SSS: 0
ST Depression (mm): 0 mm
Stress Nuclear Isotope Dose: 30.1 mCi
TID: 0.93

## 2022-02-25 LAB — ECHOCARDIOGRAM COMPLETE
Area-P 1/2: 2.59 cm2
Height: 66 in
S' Lateral: 2.7 cm
Weight: 2656 oz

## 2022-02-25 MED ORDER — TECHNETIUM TC 99M TETROFOSMIN IV KIT
10.4000 | PACK | Freq: Once | INTRAVENOUS | Status: AC | PRN
  Administered 2022-02-25: 10.4 via INTRAVENOUS

## 2022-02-25 MED ORDER — TECHNETIUM TC 99M TETROFOSMIN IV KIT
30.1000 | PACK | Freq: Once | INTRAVENOUS | Status: AC | PRN
  Administered 2022-02-25: 30.1 via INTRAVENOUS

## 2022-02-25 MED ORDER — REGADENOSON 0.4 MG/5ML IV SOLN
0.4000 mg | Freq: Once | INTRAVENOUS | Status: AC
  Administered 2022-02-25: 0.4 mg via INTRAVENOUS

## 2022-02-28 ENCOUNTER — Telehealth: Payer: Self-pay | Admitting: *Deleted

## 2022-02-28 NOTE — Telephone Encounter (Signed)
Reviewed echo and stress test results w patient.  She reports she is feeling well, except for a little lightheadedness if she gets up too quickly.  She is back on BP meds and has not seen any readings that are too low.  No headaches.  Would like to call and get her colonoscopy rescheduled.  I adv her I would ask Dr. Angelena Form to document her risk since she had her testing done and then we can forward the information to GI.  She will call them today to reschedule.

## 2022-03-01 NOTE — Telephone Encounter (Signed)
Forwarded copy of echo and nuclear stress test and addended ov note to Dr. Fuller Plan, [facility].

## 2022-03-03 ENCOUNTER — Other Ambulatory Visit: Payer: Self-pay

## 2022-03-03 ENCOUNTER — Telehealth: Payer: Self-pay

## 2022-03-03 DIAGNOSIS — Z7902 Long term (current) use of antithrombotics/antiplatelets: Secondary | ICD-10-CM

## 2022-03-03 DIAGNOSIS — K5150 Left sided colitis without complications: Secondary | ICD-10-CM

## 2022-03-03 DIAGNOSIS — Z86010 Personal history of colonic polyps: Secondary | ICD-10-CM

## 2022-03-03 NOTE — Telephone Encounter (Signed)
Per Dr. Camillia Herter office note on 01/12/22:  Addendum: 03/01/22: Stress test and echo are ok. She can proceed with planning for her procedure without further cardiac workup.   Called patient to schedule colonoscopy. Patient scheduled colon for 04/18/22 at 2:00pm. Informed patient I will send all prep instructions through MyChart and she can call back with any questions. Also, I will contact her once we hear back from cardiology regarding her Plavix clearance. Patient verbalized understanding.

## 2022-03-03 NOTE — Telephone Encounter (Signed)
   Patient Name: [name] [name]  DOB: 16-Jan-1946 MRN: [id number]  Primary Cardiologist: Lauree Chandler, MD  Chart reviewed as part of pre-operative protocol coverage. Pre-op clearance already addressed by colleagues in earlier phone notes. To summarize recommendations:  -Patient with hx of CAD with acute anterior MI April 1996 with angioplasty, CABG June 1996 (LIMA to LAD, SVG to RCA), stenting Circumflex 2016.  Per office protocol, okay to hold Plavix x5 days prior to procedure.  Please resume when medically safe to do so.   Will route this bundled recommendation to requesting provider via Epic fax function and remove from pre-op pool. Please call with questions.  Elgie Collard, PA-C 03/03/2022, 3:40 PM

## 2022-03-03 NOTE — Progress Notes (Signed)
mbam

## 2022-03-03 NOTE — Telephone Encounter (Signed)
Dr. Angelena Form informed our office that patient is cleared from a cardiac standpoint to proceed with colonoscopy. Plavix clearanc was not mentioned in his note. Is it ok that patient hold Plavix 5 days prior to her procedure? Thanks.

## 2022-03-04 NOTE — Telephone Encounter (Signed)
Left message for patient to return my call.

## 2022-03-04 NOTE — Telephone Encounter (Signed)
Informed patient to hold Plavix 5 days prior to her colonoscopy. Patient verbalized understanding.

## 2022-04-04 ENCOUNTER — Encounter: Payer: Self-pay | Admitting: Gynecologic Oncology

## 2022-04-05 ENCOUNTER — Encounter: Payer: Self-pay | Admitting: Gynecologic Oncology

## 2022-04-05 ENCOUNTER — Other Ambulatory Visit: Payer: Self-pay

## 2022-04-05 ENCOUNTER — Inpatient Hospital Stay: Payer: Medicare Other | Attending: Gynecologic Oncology | Admitting: Gynecologic Oncology

## 2022-04-05 VITALS — BP 133/55 | HR 81 | Temp 98.7°F | Resp 18 | Ht 62.0 in | Wt 172.5 lb

## 2022-04-05 DIAGNOSIS — N898 Other specified noninflammatory disorders of vagina: Secondary | ICD-10-CM | POA: Diagnosis not present

## 2022-04-05 DIAGNOSIS — Z9221 Personal history of antineoplastic chemotherapy: Secondary | ICD-10-CM | POA: Diagnosis not present

## 2022-04-05 DIAGNOSIS — Z90722 Acquired absence of ovaries, bilateral: Secondary | ICD-10-CM | POA: Insufficient documentation

## 2022-04-05 DIAGNOSIS — Z90710 Acquired absence of both cervix and uterus: Secondary | ICD-10-CM | POA: Insufficient documentation

## 2022-04-05 DIAGNOSIS — Z923 Personal history of irradiation: Secondary | ICD-10-CM | POA: Diagnosis not present

## 2022-04-05 DIAGNOSIS — C55 Malignant neoplasm of uterus, part unspecified: Secondary | ICD-10-CM

## 2022-04-05 DIAGNOSIS — Z8542 Personal history of malignant neoplasm of other parts of uterus: Secondary | ICD-10-CM

## 2022-04-05 NOTE — Patient Instructions (Addendum)
It was good to see you today.   I suspect what I biopsy today is related to radiation changes.  I will give you a call when I get the biopsy back.  I will plan to see you over the summer.  Please call back in May or June to schedule a visit to see me in late July.  As always, if you develop any new and concerning symptoms before your next visit, please call to see me sooner.

## 2022-04-05 NOTE — Progress Notes (Signed)
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 Severe distension of the endometrium highly suspicious for cervical obstruction or stenosis possibly due to a neoplastic process in this postmenopausal female with history of vaginal bleeding. Further evaluation with hysteroscopy is  recommended. 2. Diarrheal state. Correlation with clinical exam and stool cultures recommended. No bowel obstruction. Normal appendix.   01/03/2020 Surgery   D&C A. ENDOMETRIUM, CURETTAGE:  -  Poorly differentiated malignancy  -  See comment  COMMENT:  The material consists of a poorly differentiated malignancy with extensive necrosis.  By immunohistochemistry, p16 is positive and there is focal cytokeratin AE1/3 positivity but negative for cytokeratin 7, cytokeratin 5/6, CD20, CD3, p53, WT-1, Pax-8, and S100. The differential includes poorly differentiated carcinoma and high-grade stromal sarcoma.    02/04/2020 Surgery   Robotic-assisted laparoscopic total hysterectomy with bilateral salpingoophorectomy, SLN biopsy, left pelvic LND   On EUA, 10cm mobile uterus. ON intra-abdominal entry, normal upper abdominal survey. Normal appearing omentum, small and large bowel. Uterus 10cm and bulbous. Tortuous and hyperemic ovarian vasculature. Some tubal metaplasia vs. Metastatic disease. Normal appearing ovaries. Mapping successful to deep right obturator SLN and presacral SLN. NO mapping on left. Enlarge deep obturator lymph on the left. Given high grade histology, short small bowel mesentery, and recent COVID infection, decision made not to proceed with left PA LND (would have likely required open procedure. NO intra-abdominal or pelvic evidence of disease.    02/04/2020  Pathology Results   FINAL MICROSCOPIC DIAGNOSIS:   A. SENTINEL LYMPH NODE, RIGHT OBTURATOR, EXCISION:  -  No carcinoma identified in one lymph node (0/1)  -  See comment   B. UTERUS AND BILATERAL FALLOPIAN TUBES AND OVARIES, HYSTERECTOMY AND  BILATERAL SALPINGO-OOPHORECTOMY:  -  Carcinosarcoma confined to the uterus and involving less than half of the myometrium  -  See oncology table and comment below   C. LYMPH NODES, LEFT PELVIC, REGIONAL RESECTION:  -  No carcinoma identified in three lymph nodes (0/3)   D. SENTINEL LYMPH NODE, PRESACRAL, EXCISION:  -  No carcinoma identified in one lymph node (0/1)   COMMENT:   A.  Cytokeratin AE1/3 was performed on the sentinel lymph nodes to exclude micrometastasis.  There is no evidence of metastatic carcinoma by immunohistochemistry.   B.  The tumor consists of poorly cohesive epithelioid and rhabdoid cells.  A panel of immunohistochemistry (p16, f, p53, PAX 8, SMA, CD10, CD45, CD 99, CD117, CD138, cytokeratin 7, cyclin D1, cytokeratin AE1/3, EMA and GATA3) is performed to better classify this neoplasm.  By immunohistochemistry, the neoplastic cells are positive for p16, p53 and  PAX 8.  The epithelioid cells are positive for cytokeratin AE1/3 and EMA while the rhabdoid cells show positivity for desmin. Overall, the immunophenotype and morphology supports the diagnosis of carcinosarcoma.    ONCOLOGY TABLE:   UTERUS, CARCINOMA OR CARCINOSARCOMA   Procedure: Total hysterectomy and bilateral salpingo-oophorectomy  Histologic type: Carcinosarcoma  Histologic Grade: FIGO grade 3  Myometrial invasion: Estimated to be less than 50%  Uterine Serosa Involvement: Not identified  Cervical stromal involvement: Not identified  Extent of involvement of other organs: Not identified  Lymphovascular invasion: Not identified  Regional Lymph Nodes:       Examined:  2 Sentinel                               3 non-sentinel                                5 total        Lymph nodes with metastasis: 0        Isolated tumor cells (<0.2 mm): 0        Micrometastasis:  (>0.2 mm and < 2.0 mm): 0        Macrometastasis: (>2.0 mm): 0        Extracapsular extension: N/A  Representative Tumor Block: B1  MMR / MSI testing: Will be ordered  Pathologic Stage Classification (pTNM, AJCC 8th edition):  pT1a, pN0  Comments: See above    02/04/2020 Cancer Staging   Staging form: Corpus Uteri - Carcinoma and Carcinosarcoma, AJCC 8th Edition - Clinical stage from 02/04/2020: FIGO Stage IA (cT1a, cN0(sn), cM0) - Signed by Lafonda Mosses, MD on 02/12/2020   03/06/2020 Imaging   Placement of single lumen port a cath via right internal jugular vein. The catheter tip lies at the cavo-atrial junction. A power injectable port a cath was placed and is ready for immediate use.   03/12/2020 -  Chemotherapy   The patient had carboplatin and taxol for chemotherapy treatment.     04/16/2020 - 05/11/2020 Radiation Therapy   04/16/2020 through 05/11/2020 Site Technique Total Dose (Gy) Dose per Fx (Gy) Completed Fx Beam Energies  Vagina: Pelvis HDR-brachy 30/30 6 5/5 Ir-192       08/10/2020 Imaging   No evidence of recurrent or metastatic carcinoma within the abdomen or pelvis.   Aortic Atherosclerosis (ICD10-I70.0).   08/24/2020 Procedure   Successful removal of implanted Port-A-Cath.     Interval History: Doing well.  Denies any vaginal bleeding or discharge.  Reports baseline bowel and bladder function.  Has occasional twinges in her abdomen, at baseline since surgery.  Reports bloating seems to be related to when she eats ice cream.  Had some issues with her blood pressure recently.  Blood pressure was quite low at her GI appointment and she was taken off of her blood pressure medications.  She then had to go to the hospital several days later for elevated blood pressure and a headache.  She is now back on one of her blood pressure medications.  Has been  helping to care for her daughter who has metastatic and terminal cancer.  She tries to remember to use her dilator at least once a week.  Past Medical/Surgical History: Past Medical History:  Diagnosis Date   Adenomatous colon polyp 11/2005   Anginal pain ([place])    last time 2016   Anxiety    Arthritis    CAD (coronary artery disease)    2V CABG 1996 (LIMA to LAD, SVG to RCA) Dr. Servando Snare.; last stress test in 2010   Diabetes mellitus without complication ([place])    type II   GERD (gastroesophageal reflux disease)    History of radiation therapy    Uterus- [facility] brachytherapy - Dr. Gery Pray 04/16/20-05/11/20   Hyperlipidemia    Hypertension    Muscle cramps    and pains   Myocardial infarction ([place])    1996   Pneumonia    hx of x 2    Ulcerative colitis  Uterine cancer [facility])     Past Surgical History:  Procedure Laterality Date   BACK SURGERY     lumbar fusion 25 years ago and ruptured disk 10 years ago same area   BACK SURGERY     ruptered disc under lumbar disc   CARDIAC CATHETERIZATION N/A 10/13/2014   Procedure: Left Heart Cath And Coronary Angiography;  Surgeon: Burnell Blanks, MD;  Location: [place] [facility];  Service: Cardiovascular;  Laterality: N/A;   CARDIAC CATHETERIZATION N/A 10/13/2014   Procedure: Coronary Stent Intervention;  Surgeon: Burnell Blanks, MD;  Location: [place] [facility];  Service: Cardiovascular;  Laterality: N/A;   COLONOSCOPY     CORONARY ANGIOPLASTY WITH STENT PLACEMENT  10/13/2014   DES to [place]   x 2   [place] N/A 01/03/2020   Procedure: DILATATION & CURETTAGE/HYSTEROSCOPY WITH POSSIBLE MYOSURE;  Surgeon: Paula Compton, MD;  Location: [place];  Service: Gynecology;  Laterality: N/A;   DILATION AND EVACUATION N/A 01/03/2020   Procedure: DILATATION AND EVACUATION;  Surgeon: Paula Compton, MD;  Location: [place];  Service:  Gynecology;  Laterality: N/A;   IR IMAGING GUIDED PORT INSERTION  03/06/2020   IR REMOVAL TUN ACCESS W/ PORT W/O FL MOD SED  08/24/2020   OPERATIVE ULTRASOUND N/A 01/03/2020   Procedure: OPERATIVE ULTRASOUND;  Surgeon: Paula Compton, MD;  Location: [place];  Service: Gynecology;  Laterality: N/A;   POLYPECTOMY     ROBOTIC ASSISTED TOTAL HYSTERECTOMY WITH BILATERAL SALPINGO OOPHERECTOMY Bilateral 02/04/2020   Procedure: XI ROBOTIC ASSISTED TOTAL HYSTERECTOMY WITH BILATERAL SALPINGO OOPHORECTOMY, LYMPH NODE DISSECTION;  Surgeon: Lafonda Mosses, MD;  Location: [facility];  Service: Gynecology;  Laterality: Bilateral;   SENTINEL NODE BIOPSY N/A 02/04/2020   Procedure: SENTINEL NODE BIOPSY;  Surgeon: Lafonda Mosses, MD;  Location: [facility];  Service: Gynecology;  Laterality: N/A;   UPPER GASTROINTESTINAL ENDOSCOPY      Family History  Problem Relation Age of Onset   Diabetes Mother    Heart disease Mother    Heart attack Mother 22   Hypertension Mother    Lung cancer Brother    Colon cancer Brother    Breast cancer Sister    Kidney disease Brother        renal cancer   Heart disease Sister    Colon cancer Daughter        Thinks her daughter had negative genetic testing   Hypertension Sister    Stroke Sister    Pancreatitis Other    Breast cancer Daughter    Stomach cancer Neg Hx     Social History   Socioeconomic History   Marital status: Widowed    Spouse name: Ray   Number of children: 7   Years of education: 12   Highest education level: Not on file  Occupational History   Occupation: retired  Tobacco Use   Smoking status: Former    Years: 20.00    Types: Cigarettes    Quit date: 10/04/1994    Years since quitting: 27.5   Smokeless tobacco: Never  Vaping Use   Vaping Use: Never used  Substance and Sexual Activity   Alcohol use: No   Drug use: No   Sexual activity: Not Currently    Partners: Male  Other Topics Concern   Not on file  Social History Narrative    Not on file   Social Determinants of  Health   Financial Resource Strain: Not on file  Food Insecurity: Not on file  Transportation Needs: Not on file  Physical Activity: Not on file  Stress: Not on file  Social Connections: Not on file    Current Medications:  Current Outpatient Medications:    calcium carbonate (OS-CAL) 600 MG TABS tablet, Take 600 mg by mouth daily with breakfast., Disp: , Rfl:    Cholecalciferol (VITAMIN D) 50 MCG (2000 UT) tablet, Take 2,000 Units by mouth daily., Disp: , Rfl:    clopidogrel (PLAVIX) 75 MG tablet, Take 1 tablet (75 mg total) by mouth daily., Disp: 90 tablet, Rfl: 1   FLUoxetine (PROZAC) 10 MG capsule, Take 10 mg by mouth daily. , Disp: , Rfl:    LORazepam (ATIVAN) 1 MG tablet, Take 1 tablet (1 mg total) by mouth every 8 (eight) hours as needed for anxiety (for anxiety)., Disp: 60 tablet, Rfl: 1   losartan-hydrochlorothiazide (HYZAAR) 50-12.5 MG tablet, Take 1 tablet by mouth daily., Disp: , Rfl:    mesalamine (APRISO) 0.375 g 24 hr capsule, Take 4 capsules (1.5 g total) by mouth daily., Disp: 360 capsule, Rfl: 3   Multiple Vitamins-Minerals (MULTIVITAMIN WITH MINERALS) tablet, Take 1 tablet by mouth daily., Disp: , Rfl:    nitroGLYCERIN (NITROSTAT) 0.4 MG SL tablet, Place 1 tablet (0.4 mg total) under the tongue every 5 (five) minutes as needed for chest pain., Disp: 25 tablet, Rfl: 3   pantoprazole (PROTONIX) 40 MG tablet, Take 1 tablet (40 mg total) by mouth daily., Disp: 90 tablet, Rfl: 1   rosuvastatin (CRESTOR) 20 MG tablet, Take 1 tablet (20 mg total) by mouth daily., Disp: 90 tablet, Rfl: 3  Review of Systems: + Anxiety and depression Denies appetite changes, fevers, chills, fatigue, unexplained weight changes. Denies hearing loss, neck lumps or masses, mouth sores, ringing in ears or voice changes. Denies cough or wheezing.  Denies shortness of breath. Denies chest pain or palpitations. Denies leg swelling. Denies abdominal distention,  pain, blood in stools, constipation, diarrhea, nausea, vomiting, or early satiety. Denies pain with intercourse, dysuria, frequency, hematuria or incontinence. Denies hot flashes, pelvic pain, vaginal bleeding or vaginal discharge.   Denies joint pain, back pain or muscle pain/cramps. Denies itching, rash, or wounds. Denies dizziness, headaches, numbness or seizures. Denies swollen lymph nodes or glands, denies easy bruising or bleeding. Denies confusion, or decreased concentration.  Physical Exam: BP (!) 133/55 (BP Location: Left Arm, Patient Position: Sitting)   Pulse 81   Temp 98.7 F (37.1 C) (Oral)   Resp 18   Ht 5' 2" (1.575 m)   Wt 172 lb 8 oz (78.2 kg)   SpO2 98%   BMI 31.55 kg/m  General: Alert, oriented, no acute distress. HEENT: Normocephalic, atraumatic, sclera anicteric. Chest: Clear to auscultation bilaterally.  No wheezes or rhonchi. Cardiovascular: Regular rate and rhythm, no murmurs. Abdomen: Obese, soft, nontender.  Normoactive bowel sounds.  No masses or hepatosplenomegaly appreciated.  Well-healed incisions. Extremities: Grossly normal range of motion.  Warm, well perfused.  No edema bilaterally. Skin: No rashes or lesions noted. Lymphatics: No cervical, supraclavicular, or inguinal adenopathy. GU: Normal appearing external genitalia without erythema, excoriation, or lesions.  Speculum exam reveals moderately atrophic vaginal mucosa, no lesions or masses, no bleeding or discharge.  Bimanual exam reveals cuff intact, no nodularity or masses.  There is some narrowing towards the very apex of the vagina, slightly worsened since her last visit.  Inspection, along the right sidewall at this area of  narrowing there is significant change related to radiation in a couple of atypical vessels.  Rectovaginal exam confirms findings.  Vaginal biopsy procedure Preoperative diagnosis: Vaginal lesion Postoperative diagnosis: Same as above Physician: Berline Lopes MD Estimated blood  loss: Minimal Specimens: vaginal sidewall biopsy at 9 o'c Procedure: After the procedure was discussed with the patient including risks and benefits, she gave verbal consent.  She was already in dorsolithotomy position and a speculum was in the vagina.  Once the area along the vaginal side wall with subtle changes from last visit was visualized, it was cleansed with Betadine x3.  Tischler forceps were used to take a biopsy.  This was placed in formalin.  Silver nitrate was used to achieve hemostasis.  Overall the patient tolerated the procedure well.  All instruments were removed from the vagina.  Laboratory & Radiologic Studies: None new  Assessment & Plan: [name] [name] is a 76 y.o. woman with stage IA carcinosarcoma who presents for surveillance visit, completed adjuvant therapy at the end of 2021.   Patient is overall doing well.  Biopsy taken today which I suspect will show changes consistent with radiation but performed given high risk histology and slight change in her exam from last visit.   Continues to have occasional bloating but this seems diet dependent.   We discussed signs and symptoms that would be concerning for disease recurrence, and she knows to call if she develops any of these sooner than her next scheduled visit.  Per NCCN recommendations in the setting of her high risk histology, given that she is almost 2 years out from diagnosis, we will transition to visits every 6 months after her next visit with Dr Sondra Come.  She is scheduled to see Dr. Sondra Come in late January.  Asked her to call my office in June to schedule a visit to see me in July.  Support offered given daughter's terminal cancer diagnosis.  28 minutes of total time was spent for this patient encounter, including preparation, face-to-face counseling with the patient and coordination of care, and documentation of the encounter.  Jeral Pinch, MD  Division of Gynecologic Oncology  Department of Obstetrics and  Gynecology  [facility]

## 2022-04-07 LAB — SURGICAL PATHOLOGY

## 2022-04-11 ENCOUNTER — Encounter: Payer: Self-pay | Admitting: Gastroenterology

## 2022-04-18 ENCOUNTER — Ambulatory Visit (AMBULATORY_SURGERY_CENTER): Payer: Medicare Other | Admitting: Gastroenterology

## 2022-04-18 ENCOUNTER — Encounter: Payer: Self-pay | Admitting: Gastroenterology

## 2022-04-18 VITALS — BP 121/56 | HR 73 | Temp 96.8°F | Resp 16 | Ht 66.0 in | Wt 163.0 lb

## 2022-04-18 DIAGNOSIS — Z86010 Personal history of colonic polyps: Secondary | ICD-10-CM

## 2022-04-18 DIAGNOSIS — K5150 Left sided colitis without complications: Secondary | ICD-10-CM | POA: Diagnosis not present

## 2022-04-18 DIAGNOSIS — Z09 Encounter for follow-up examination after completed treatment for conditions other than malignant neoplasm: Secondary | ICD-10-CM

## 2022-04-18 DIAGNOSIS — D123 Benign neoplasm of transverse colon: Secondary | ICD-10-CM

## 2022-04-18 MED ORDER — SODIUM CHLORIDE 0.9 % IV SOLN: 500.0000 mL | Freq: Once | INTRAVENOUS | Status: DC

## 2022-04-18 NOTE — Progress Notes (Signed)
Pt's states no medical or surgical changes since previsit or office visit. 

## 2022-04-18 NOTE — Patient Instructions (Signed)
Information on polyps given to you today.  Await pathology results.  Resume Plavix at prior dose in 2 days.  Resume previous diet and any other medications.   YOU HAD AN ENDOSCOPIC PROCEDURE TODAY AT [facility]:   Refer to the procedure report that was given to you for any specific questions about what was found during the examination.  If the procedure report does not answer your questions, please call your gastroenterologist to clarify.  If you requested that your care partner not be given the details of your procedure findings, then the procedure report has been included in a sealed envelope for you to review at your convenience later.  YOU SHOULD EXPECT: Some feelings of bloating in the abdomen. Passage of more gas than usual.  Walking can help get rid of the air that was put into your GI tract during the procedure and reduce the bloating. If you had a lower endoscopy (such as a colonoscopy or flexible sigmoidoscopy) you may notice spotting of blood in your stool or on the toilet paper. If you underwent a bowel prep for your procedure, you may not have a normal bowel movement for a few days.  Please Note:  You might notice some irritation and congestion in your nose or some drainage.  This is from the oxygen used during your procedure.  There is no need for concern and it should clear up in a day or so.  SYMPTOMS TO REPORT IMMEDIATELY:  Following lower endoscopy (colonoscopy or flexible sigmoidoscopy):  Excessive amounts of blood in the stool  Significant tenderness or worsening of abdominal pains  Swelling of the abdomen that is new, acute  Fever of 100F or higher   For urgent or emergent issues, a gastroenterologist can be reached at any hour by calling [phone]. Do not use MyChart messaging for urgent concerns.    DIET:  We do recommend a small meal at first, but then you may proceed to your regular diet.  Drink plenty of fluids but you should avoid  alcoholic beverages for 24 hours.  ACTIVITY:  You should plan to take it easy for the rest of today and you should NOT DRIVE or use heavy machinery until tomorrow (because of the sedation medicines used during the test).    FOLLOW UP: Our staff will call the number listed on your records the next business day following your procedure.  We will call around 7:15- 8:00 am to check on you and address any questions or concerns that you may have regarding the information given to you following your procedure. If we do not reach you, we will leave a message.     If any biopsies were taken you will be contacted by phone or by letter within the next 1-3 weeks.  Please call us at [phone] if you have not heard about the biopsies in 3 weeks.    SIGNATURES/CONFIDENTIALITY: You and/or your care partner have signed paperwork which will be entered into your electronic medical record.  These signatures attest to the fact that that the information above on your After Visit Summary has been reviewed and is understood.  Full responsibility of the confidentiality of this discharge information lies with you and/or your care-partner.

## 2022-04-18 NOTE — Progress Notes (Signed)
Called to room to assist during endoscopic procedure.  Patient ID and intended procedure confirmed with present staff. Received instructions for my participation in the procedure from the performing physician.  

## 2022-04-18 NOTE — Progress Notes (Signed)
History & Physical  Primary Care Physician:  Rolla Etienne, MD Primary Gastroenterologist: Lucio Edward, MD  CHIEF COMPLAINT: Left-sided ulcerative colitis, Personal history of colon polyps   HPI: [name] [name] is a 76 y.o. female with a personal history of longstanding left-sided ulcerative colitis and adenomatous colon polyps for colonoscopy.   Past Medical History:  Diagnosis Date   Adenomatous colon polyp 11/2005   Anginal pain ([place])    last time 2016   Anxiety    Arthritis    CAD (coronary artery disease)    2V CABG 1996 (LIMA to LAD, SVG to RCA) Dr. Servando Snare.; last stress test in 2010   Diabetes mellitus without complication ([place])    type II   GERD (gastroesophageal reflux disease)    History of radiation therapy    Uterus- [facility] brachytherapy - Dr. Gery Pray 04/16/20-05/11/20   Hyperlipidemia    Hypertension    Muscle cramps    and pains   Myocardial infarction ([place])    1996   Pneumonia    hx of x 2    Ulcerative colitis    Uterine cancer ([place])     Past Surgical History:  Procedure Laterality Date   BACK SURGERY     lumbar fusion 25 years ago and ruptured disk 10 years ago same area   BACK SURGERY     ruptered disc under lumbar disc   CARDIAC CATHETERIZATION N/A 10/13/2014   Procedure: Left Heart Cath And Coronary Angiography;  Surgeon: Burnell Blanks, MD;  Location: [facility];  Service: Cardiovascular;  Laterality: N/A;   CARDIAC CATHETERIZATION N/A 10/13/2014   Procedure: Coronary Stent Intervention;  Surgeon: Burnell Blanks, MD;  Location: [facility];  Service: Cardiovascular;  Laterality: N/A;   COLONOSCOPY     CORONARY ANGIOPLASTY WITH STENT PLACEMENT  10/13/2014   DES to [place]   x 2   [place] N/A 01/03/2020   Procedure: DILATATION & CURETTAGE/HYSTEROSCOPY WITH POSSIBLE MYOSURE;  Surgeon: Paula Compton, MD;   Location: [facility];  Service: Gynecology;  Laterality: N/A;   DILATION AND EVACUATION N/A 01/03/2020   Procedure: DILATATION AND EVACUATION;  Surgeon: Paula Compton, MD;  Location: [facility];  Service: Gynecology;  Laterality: N/A;   IR IMAGING GUIDED PORT INSERTION  03/06/2020   IR REMOVAL TUN ACCESS W/ PORT W/O FL MOD SED  08/24/2020   OPERATIVE ULTRASOUND N/A 01/03/2020   Procedure: OPERATIVE ULTRASOUND;  Surgeon: Paula Compton, MD;  Location: [facility];  Service: Gynecology;  Laterality: N/A;   POLYPECTOMY     ROBOTIC ASSISTED TOTAL HYSTERECTOMY WITH BILATERAL SALPINGO OOPHERECTOMY Bilateral 02/04/2020   Procedure: XI ROBOTIC ASSISTED TOTAL HYSTERECTOMY WITH BILATERAL SALPINGO OOPHORECTOMY, LYMPH NODE DISSECTION;  Surgeon: Lafonda Mosses, MD;  Location: [facility];  Service: Gynecology;  Laterality: Bilateral;   SENTINEL NODE BIOPSY N/A 02/04/2020   Procedure: SENTINEL NODE BIOPSY;  Surgeon: Lafonda Mosses, MD;  Location: [facility];  Service: Gynecology;  Laterality: N/A;   UPPER GASTROINTESTINAL ENDOSCOPY      Prior to Admission medications   Medication Sig Start Date End Date Taking? Authorizing Provider  calcium carbonate (OS-CAL) 600 MG TABS tablet Take 600 mg by mouth daily with breakfast.   Yes [provider]  Cholecalciferol (VITAMIN D) 50 MCG (2000 UT) tablet Take 2,000 Units by mouth daily.   Yes [provider]  FLUoxetine (PROZAC) 10 MG capsule Take  10 mg by mouth daily.  12/04/13  Yes [provider]  losartan-hydrochlorothiazide (HYZAAR) 50-12.5 MG tablet Take 1 tablet by mouth daily.   Yes [provider]  Multiple Vitamins-Minerals (MULTIVITAMIN WITH MINERALS) tablet Take 1 tablet by mouth daily.   Yes [provider]  pantoprazole (PROTONIX) 40 MG tablet Take 1 tablet (40 mg total) by mouth daily. 02/23/21  Yes Burnell Blanks, MD  rosuvastatin (CRESTOR) 20 MG tablet Take 1 tablet (20 mg total) by mouth daily. 07/13/21  Yes  Burnell Blanks, MD  clopidogrel (PLAVIX) 75 MG tablet Take 1 tablet (75 mg total) by mouth daily. 02/23/21   Burnell Blanks, MD  LORazepam (ATIVAN) 1 MG tablet Take 1 tablet (1 mg total) by mouth every 8 (eight) hours as needed for anxiety (for anxiety). 05/14/20   Heath Lark, MD  mesalamine (APRISO) 0.375 g 24 hr capsule Take 4 capsules (1.5 g total) by mouth daily. 03/30/21   Ladene Artist, MD  nitroGLYCERIN (NITROSTAT) 0.4 MG SL tablet Place 1 tablet (0.4 mg total) under the tongue every 5 (five) minutes as needed for chest pain. 02/23/21   Burnell Blanks, MD    Current Outpatient Medications  Medication Sig Dispense Refill   calcium carbonate (OS-CAL) 600 MG TABS tablet Take 600 mg by mouth daily with breakfast.     Cholecalciferol (VITAMIN D) 50 MCG (2000 UT) tablet Take 2,000 Units by mouth daily.     FLUoxetine (PROZAC) 10 MG capsule Take 10 mg by mouth daily.      losartan-hydrochlorothiazide (HYZAAR) 50-12.5 MG tablet Take 1 tablet by mouth daily.     Multiple Vitamins-Minerals (MULTIVITAMIN WITH MINERALS) tablet Take 1 tablet by mouth daily.     pantoprazole (PROTONIX) 40 MG tablet Take 1 tablet (40 mg total) by mouth daily. 90 tablet 1   rosuvastatin (CRESTOR) 20 MG tablet Take 1 tablet (20 mg total) by mouth daily. 90 tablet 3   clopidogrel (PLAVIX) 75 MG tablet Take 1 tablet (75 mg total) by mouth daily. 90 tablet 1   LORazepam (ATIVAN) 1 MG tablet Take 1 tablet (1 mg total) by mouth every 8 (eight) hours as needed for anxiety (for anxiety). 60 tablet 1   mesalamine (APRISO) 0.375 g 24 hr capsule Take 4 capsules (1.5 g total) by mouth daily. 360 capsule 3   nitroGLYCERIN (NITROSTAT) 0.4 MG SL tablet Place 1 tablet (0.4 mg total) under the tongue every 5 (five) minutes as needed for chest pain. 25 tablet 3   Current Facility-Administered Medications  Medication Dose Route Frequency Provider Last Rate Last Admin   0.9 %  sodium chloride infusion  500 mL  Intravenous Once Ladene Artist, MD        Allergies as of 04/18/2022   (No Known Allergies)    Family History  Problem Relation Age of Onset   Diabetes Mother    Heart disease Mother    Heart attack Mother 29   Hypertension Mother    Lung cancer Brother    Colon cancer Brother    Breast cancer Sister    Kidney disease Brother        renal cancer   Heart disease Sister    Colon cancer Daughter        Thinks her daughter had negative genetic testing   Hypertension Sister    Stroke Sister    Pancreatitis Other    Breast cancer Daughter    Stomach cancer Neg Hx  Social History   Socioeconomic History   Marital status: Widowed    Spouse name: Ray   Number of children: 7   Years of education: 12   Highest education level: Not on file  Occupational History   Occupation: retired  Tobacco Use   Smoking status: Former    Years: 20.00    Types: Cigarettes    Quit date: 10/04/1994    Years since quitting: 27.5   Smokeless tobacco: Never  Vaping Use   Vaping Use: Never used  Substance and Sexual Activity   Alcohol use: No   Drug use: No   Sexual activity: Not Currently    Partners: Male  Other Topics Concern   Not on file  Social History Narrative   Not on file   Social Determinants of Health   Financial Resource Strain: Not on file  Food Insecurity: Not on file  Transportation Needs: Not on file  Physical Activity: Not on file  Stress: Not on file  Social Connections: Not on file  Intimate Partner Violence: Not on file    Review of Systems:  All systems reviewed were negative except where noted in HPI.   Physical Exam: General:  Alert, well-developed, in NAD Head:  Normocephalic and atraumatic. Eyes:  Sclera clear, no icterus.   Conjunctiva pink. Ears:  Normal auditory acuity. Mouth:  No deformity or lesions.  Neck:  Supple; no masses . Lungs:  Clear throughout to auscultation.   No wheezes, crackles, or rhonchi. No acute distress. Heart:   Regular rate and rhythm; no murmurs. Abdomen:  Soft, nondistended, nontender. No masses, hepatomegaly. No obvious masses.  Normal bowel .    Rectal:  Deferred   Msk:  Symmetrical without gross deformities.. Pulses:  Normal pulses noted. Extremities:  Without edema. Neurologic:  Alert and  oriented x4;  grossly normal neurologically. Skin:  Intact without significant lesions or rashes. Cervical Nodes:  No significant cervical adenopathy. Psych:  Alert and cooperative. Normal mood and affect.   Impression / Plan:   Personal history of left-sided ulcerative colitis and adenomatous colon polyps for colonoscopy.  Pricilla Riffle. Fuller Plan  04/18/2022, 1:51 PM See Shea Evans, [facility], to contact our on call provider

## 2022-04-18 NOTE — Op Note (Addendum)
[facility] Patient Name: [name] [name] Procedure Date: 04/18/2022 1:51 PM MRN: [id number] Endoscopist: Ladene Artist , MD, [id number] Age: 76 Referring MD:  Date of Birth: 11/28/1945 Gender: Female Account #: [id number] Procedure:                Colonoscopy Indications:              High risk colon cancer surveillance: Ulcerative                            left sided colitis of 8 (or more) years duration Medicines:                Monitored Anesthesia Care Procedure:                Pre-Anesthesia Assessment:                           - Prior to the procedure, a History and Physical                            was performed, and patient medications and                            allergies were reviewed. The patient's tolerance of                            previous anesthesia was also reviewed. The risks                            and benefits of the procedure and the sedation                            options and risks were discussed with the patient.                            All questions were answered, and informed consent                            was obtained. Prior Anticoagulants: The patient has                            taken Plavix (clopidogrel), last dose was 5 days                            prior to procedure. ASA Grade Assessment: II - A                            patient with mild systemic disease. After reviewing                            the risks and benefits, the patient was deemed in                            satisfactory condition to undergo the procedure.  After obtaining informed consent, the colonoscope                            was passed under direct vision. Throughout the                            procedure, the patient's blood pressure, pulse, and                            oxygen saturations were monitored continuously. The                            CF [id number] #[id number] was introduced through the anus                             and advanced to the the cecum, identified by                            appendiceal orifice and ileocecal valve. The                            Olympus [id number]) Colonoscope was                            introduced through the anus and advanced to the the                            cecum, identified by appendiceal orifice and                            ileocecal valve. The ileocecal valve, appendiceal                            orifice, and rectum were photographed. The quality                            of the bowel preparation was fair despite extensive                            lavage, suction. The colonoscopy was somewhat                            difficult due to restricted mobility of the colon,                            significant looping and a tortuous colon.                            Successful completion of the procedure was aided by                            withdrawing the scope and replacing with the  pediatric colonoscope, straightening and shortening                            the scope to obtain bowel loop reduction and using                            scope torsion. The patient tolerated the procedure                            well. Unable to safely retroflex due a narrow                            rectal vault. Scope In: 2:10:58 PM Scope Out: 2:33:25 PM Scope Withdrawal Time: 0 hours 13 minutes 57 seconds  Total Procedure Duration: 0 hours 22 minutes 27 seconds  Findings:                 The perianal and digital rectal examinations were                            normal.                           A 6 mm polyp was found in the transverse colon. The                            polyp was sessile. The polyp was removed with a                            cold snare. Resection and retrieval were complete.                           Internal hemorrhoids were found during                            retroflexion. The  hemorrhoids were small and Grade                            I (internal hemorrhoids that do not prolapse).                           The exam was otherwise without abnormality on                            direct views. Random biopsies obtained. Complications:            No immediate complications. Estimated blood loss:                            None. Estimated Blood Loss:     Estimated blood loss: none. Impression:               - Preparation of the colon was fair.                           - One 6 mm  polyp in the transverse colon, removed                            with a cold snare. Resected and retrieved.                           - Internal hemorrhoids.                           - The examination was otherwise normal on direct                            views. Biopsied. Recommendation:           - Repeat colonoscopy in 3 years vs no repeat due to                            age for surveillance based on pathology results                            with a more extensive bowel prep.                           - Patient has a contact number available for                            emergencies. The signs and symptoms of potential                            delayed complications were discussed with the                            patient. Return to normal activities tomorrow.                            Written discharge instructions were provided to the                            patient.                           - Resume previous diet.                           - Continue present medications.                           - Await pathology results.                           - Resume Plavix (clopidogrel) at prior dose in 2                            days. Refer to managing physician for further  adjustment of therapy. Ladene Artist, MD 04/18/2022 2:40:43 PM This report has been signed electronically.

## 2022-04-19 ENCOUNTER — Telehealth: Payer: Self-pay

## 2022-04-19 NOTE — Telephone Encounter (Signed)
  Follow up Call-     04/18/2022    1:20 PM  Call back number  Post procedure Call Back phone  # [phone]  Permission to leave phone message Yes     Follow up call made.  NALM

## 2022-04-20 ENCOUNTER — Other Ambulatory Visit: Payer: Self-pay | Admitting: Gastroenterology

## 2022-05-09 ENCOUNTER — Encounter: Payer: Self-pay | Admitting: Gastroenterology

## 2022-06-07 ENCOUNTER — Ambulatory Visit
Admission: EM | Admit: 2022-06-07 | Discharge: 2022-06-07 | Disposition: A | Discharge status: Home / Self Care | Payer: Medicare Other | Attending: Family Medicine | Admitting: Family Medicine

## 2022-06-07 DIAGNOSIS — H6993 Unspecified Eustachian tube disorder, bilateral: Secondary | ICD-10-CM

## 2022-06-07 DIAGNOSIS — J3489 Other specified disorders of nose and nasal sinuses: Secondary | ICD-10-CM | POA: Diagnosis not present

## 2022-06-07 MED ORDER — FLUTICASONE PROPIONATE 50 MCG/ACT NA SUSP: 1.0000 | Freq: Two times a day (BID) | NASAL | 2 refills | Status: DC

## 2022-06-07 MED ORDER — DEXAMETHASONE SODIUM PHOSPHATE 10 MG/ML IJ SOLN
10.0000 mg | Freq: Once | INTRAMUSCULAR | Status: AC
  Administered 2022-06-07: 10 mg via INTRAMUSCULAR

## 2022-06-07 NOTE — ED Triage Notes (Signed)
Pt presents with R ear fullness. States she has pain and a noise in R ear when blowing nose. Feels that L ear is full but not painful.

## 2022-06-07 NOTE — Discharge Instructions (Signed)
Take Coricidin HBP as needed additionally for the decongestant properties.  I have sent over Flonase nasal spray to use consistently twice daily and we have given you a steroid shot today.  All of these together should help your symptoms.

## 2022-06-07 NOTE — ED Provider Notes (Signed)
[facility]    CSN: [id number] Arrival date & time: 06/07/22  1218      History   Chief Complaint Chief Complaint  Patient presents with   Otalgia    HPI [name] [name] is a 76 y.o. female.   Presenting today with several day history of sinus pain and pressure, bilateral ear fullness worse on the right with some pain on the right ear and a popping noise when blowing her nose.  Denies fever, chills, body aches, cough, chest pain, shortness of breath.  So far took a sinus pill with some minimal temporary relief.  No known history of seasonal allergies or sick contacts per patient.    Past Medical History:  Diagnosis Date   Adenomatous colon polyp 11/2005   Anginal pain ([place])    last time 2016   Anxiety    Arthritis    CAD (coronary artery disease)    2V CABG 1996 (LIMA to LAD, SVG to RCA) Dr. Servando Snare.; last stress test in 2010   Diabetes mellitus without complication ([place])    type II   GERD (gastroesophageal reflux disease)    History of radiation therapy    Uterus- [facility] brachytherapy - Dr. 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   Hypokalemia 03/16/2020   Other constipation 03/16/2020   Leg cramps 03/16/2020   Weight loss, non-intentional 03/16/2020   Preventive measure 02/27/2020   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   Unstable angina ([place]) 10/13/2014   CAD (coronary artery disease) 02/09/2011   HTN (hypertension) 02/09/2011   GERD 08/17/2009   ULCERATIVE COLITIS-LEFT SIDE 08/12/2008   PERSONAL HX COLONIC POLYPS 08/12/2008     Past Surgical History:  Procedure Laterality Date   BACK SURGERY     lumbar fusion 25 years ago and ruptured disk 10 years ago same area   BACK SURGERY     ruptered disc under lumbar disc   CARDIAC CATHETERIZATION N/A 10/13/2014   Procedure: Left Heart Cath And Coronary Angiography;  Surgeon: Burnell Blanks, MD;  Location: [place] [facility]UP[facility];  Service: Cardiovascular;  Laterality: N/A;   CARDIAC CATHETERIZATION N/A 10/13/2014   Procedure: Coronary Stent Intervention;  Surgeon: Burnell Blanks, MD;  Location: [place] INVASIVE CV LAB CUPID;  Service: Cardiovascular;  Laterality: N/A;   COLONOSCOPY     CORONARY ANGIOPLASTY WITH STENT PLACEMENT  10/13/2014   DES to [place]   x 2   [place] N/A 01/03/2020   Procedure: DILATATION & CURETTAGE/HYSTEROSCOPY WITH POSSIBLE MYOSURE;  Surgeon: Paula Compton, MD;  Location: [place];  Service: Gynecology;  Laterality: N/A;   DILATION AND EVACUATION N/A 01/03/2020   Procedure: DILATATION AND EVACUATION;  Surgeon: Paula Compton, MD;  Location: [place];  Service: Gynecology;  Laterality: N/A;   IR IMAGING GUIDED PORT INSERTION  03/06/2020   IR REMOVAL TUN ACCESS W/ PORT W/O FL MOD SED  08/24/2020   OPERATIVE ULTRASOUND N/A 01/03/2020  Procedure: OPERATIVE ULTRASOUND;  Surgeon: Paula Compton, MD;  Location: [place];  Service: Gynecology;  Laterality: N/A;   POLYPECTOMY     ROBOTIC ASSISTED TOTAL HYSTERECTOMY WITH BILATERAL SALPINGO OOPHERECTOMY Bilateral 02/04/2020   Procedure: XI ROBOTIC ASSISTED TOTAL HYSTERECTOMY WITH BILATERAL SALPINGO OOPHORECTOMY, LYMPH NODE DISSECTION;  Surgeon: Lafonda Mosses, MD;  Location: [facility];  Service: Gynecology;  Laterality: Bilateral;   SENTINEL NODE BIOPSY N/A 02/04/2020   Procedure: SENTINEL NODE BIOPSY;  Surgeon: Lafonda Mosses, MD;  Location: [facility];  Service: Gynecology;  Laterality: N/A;   UPPER  GASTROINTESTINAL ENDOSCOPY      OB History     Gravida  5   Para  4   Term      Preterm      AB      Living         SAB      IAB      Ectopic      Multiple      Live Births               Home Medications    Prior to Admission medications   Medication Sig Start Date End Date Taking? Authorizing Provider  clopidogrel (PLAVIX) 75 MG tablet Take 1 tablet (75 mg total) by mouth daily. 02/23/21  Yes Burnell Blanks, MD  FLUoxetine (PROZAC) 10 MG capsule Take 10 mg by mouth daily.  12/04/13  Yes [provider]  fluticasone (FLONASE) 50 MCG/ACT nasal spray Place 1 spray into both nostrils 2 (two) times daily. 06/07/22  Yes Volney American, PA-C  LORazepam (ATIVAN) 1 MG tablet Take 1 tablet (1 mg total) by mouth every 8 (eight) hours as needed for anxiety (for anxiety). 05/14/20  Yes Gorsuch, Ni, MD  losartan-hydrochlorothiazide (HYZAAR) 50-12.5 MG tablet Take 1 tablet by mouth daily.   Yes [provider]  mesalamine (APRISO) 0.375 g 24 hr capsule TAKE 4 CAPSULES  BY MOUTH ONCE DAILY 04/20/22  Yes Ladene Artist, MD  nitroGLYCERIN (NITROSTAT) 0.4 MG SL tablet Place 1 tablet (0.4 mg total) under the tongue every 5 (five) minutes as needed for chest pain. 02/23/21  Yes Burnell Blanks, MD  pantoprazole (PROTONIX) 40 MG tablet Take 1 tablet (40 mg total) by mouth daily. 02/23/21  Yes Burnell Blanks, MD  rosuvastatin (CRESTOR) 20 MG tablet Take 1 tablet (20 mg total) by mouth daily. 07/13/21  Yes Burnell Blanks, MD  calcium carbonate (OS-CAL) 600 MG TABS tablet Take 600 mg by mouth daily with breakfast.    [provider]  Cholecalciferol (VITAMIN D) 50 MCG (2000 UT) tablet Take 2,000 Units by mouth daily.    [provider]  Multiple Vitamins-Minerals (MULTIVITAMIN WITH MINERALS) tablet Take 1 tablet by mouth daily.    [provider]    Family History Family History  Problem Relation Age of  Onset   Diabetes Mother    Heart disease Mother    Heart attack Mother 41   Hypertension Mother    Lung cancer Brother    Colon cancer Brother    Breast cancer Sister    Kidney disease Brother        renal cancer   Heart disease Sister    Colon cancer Daughter        Thinks her daughter had negative genetic testing   Hypertension Sister    Stroke Sister    Pancreatitis Other    Breast cancer Daughter    Stomach  cancer Neg Hx     Social History Social History   Tobacco Use   Smoking status: Former    Years: 20.00    Types: Cigarettes    Quit date: 10/04/1994    Years since quitting: 27.6   Smokeless tobacco: Never  Vaping Use   Vaping Use: Never used  Substance Use Topics   Alcohol use: No   Drug use: No     Allergies   Patient has no known allergies.   Review of Systems Review of Systems HPI  Physical Exam Triage Vital Signs ED Triage Vitals  Enc Vitals Group     BP 06/07/22 1555 118/63     Pulse Rate 06/07/22 1555 74     Resp 06/07/22 1555 18     Temp 06/07/22 1555 97.9 F (36.6 C)     Temp Source 06/07/22 1555 Oral     SpO2 06/07/22 1555 97 %     Weight --      Height --      Head Circumference --      Peak Flow --      Pain Score 06/07/22 1550 0     Pain Loc --      Pain Edu? --      Excl. in [place]? --    No data found.  Updated Vital Signs BP 118/63 (BP Location: Right Arm)   Pulse 74   Temp 97.9 F (36.6 C) (Oral)   Resp 18   SpO2 97%   Visual Acuity Right Eye Distance:   Left Eye Distance:   Bilateral Distance:    Right Eye Near:   Left Eye Near:    Bilateral Near:     Physical Exam Vitals and nursing note reviewed.  Constitutional:      Appearance: Normal appearance.  HENT:     Head: Atraumatic.     Right Ear: External ear normal.     Left Ear: External ear normal.     Ears:     Comments: Bilateral middle ear effusions    Nose: Rhinorrhea present.     Mouth/Throat:     Mouth: Mucous membranes are moist.     Pharynx:  Posterior oropharyngeal erythema present.  Eyes:     Extraocular Movements: Extraocular movements intact.     Conjunctiva/sclera: Conjunctivae normal.  Cardiovascular:     Rate and Rhythm: Normal rate and regular rhythm.     Heart sounds: Normal heart sounds.  Pulmonary:     Effort: Pulmonary effort is normal.     Breath sounds: Normal breath sounds. No wheezing.  Musculoskeletal:        General: Normal range of motion.     Cervical back: Normal range of motion and neck supple.  Skin:    General: Skin is warm and dry.  Neurological:     Mental Status: She is alert and oriented to person, place, and time.  Psychiatric:        Mood and Affect: Mood normal.        Thought Content: Thought content normal.    UC Treatments / Results  Labs (all labs ordered are listed, but only abnormal results are displayed) Labs Reviewed - No data to display  EKG  Radiology No results found.  Procedures Procedures (including critical care time)  Medications Ordered in UC Medications  dexamethasone (DECADRON) injection 10 mg (10 mg Intramuscular Given 06/07/22 1625)    Initial Impression / Assessment and Plan / UC Course  I have  reviewed the triage vital signs and the nursing notes.  Pertinent labs & imaging results that were available during my care of the patient were reviewed by me and considered in my medical decision making (see chart for details).     Vital signs reassuring today, suspect viral versus allergic cause causing some sinus pressure, eustachian tube dysfunction.  Treat with Coricidin HBP, Flonase, IM Decadron given in clinic today.  Return precautions reviewed at length.  Final Clinical Impressions(s) / UC Diagnoses   Final diagnoses:  Sinus pressure  Eustachian tube dysfunction, bilateral     Discharge Instructions      Take Coricidin HBP as needed additionally for the decongestant properties.  I have sent over Flonase nasal spray to use consistently twice  daily and we have given you a steroid shot today.  All of these together should help your symptoms.    ED Prescriptions     Medication Sig Dispense Auth. Provider   fluticasone (FLONASE) 50 MCG/ACT nasal spray Place 1 spray into both nostrils 2 (two) times daily. 16 g Volney American, [place]      PDMP not reviewed this encounter.   Volney American, [place] 06/07/22 1628

## 2022-07-04 NOTE — Progress Notes (Incomplete)
[name] [name] is here today for follow up post radiation to the pelvic.  They completed their radiation on:  05/11/20    Does the patient complain of any of the following:  Pain:*** Abdominal bloating: *** Diarrhea/Constipation: *** Nausea/Vomiting: *** Vaginal Discharge: *** Blood in Urine or Stool: *** Urinary Issues (dysuria/incomplete emptying/ incontinence/ increased frequency/urgency): *** Does patient report using vaginal dilator 2-3 times a week and/or sexually active 2-3 weeks: *** Post radiation skin changes: ***   Additional comments if applicable:

## 2022-07-08 NOTE — Progress Notes (Incomplete)
Radiation Oncology         [phone] ________________________________  Name: [name] [name] MRN: [id number]  Date: 07/11/2022  DOB: 07/04/1945  Follow-Up Visit Note  CC: Rolla Etienne, MD  [name], MD    ICD-10-CM   1. 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[place])  C55       Diagnosis: Early stage carcinosarcoma    Cancer Staging  Carcinosarcoma of uterus [facility]) Staging form: Corpus Uteri - Carcinoma and Carcinosarcoma, AJCC 8th Edition - Clinical stage from 02/04/2020: FIGO Stage IA (cT1a, cN0(sn), cM0) - Signed by Lafonda Mosses, MD on 02/12/2020  Interval Since Last Radiation: 2 years and 2 months   Radiation Treatment Dates: 04/16/2020 through 05/11/2020 Site Technique Total Dose (Gy) Dose per Fx (Gy) Completed Fx Beam Energies  Vagina: Pelvis HDR-brachy 30/30 6 5/5 Ir-192   Narrative:  The patient returns today for routine 6 month follow-up. She was last seen here for follow-up on 01/03/22. Since her last visit, the patient followed up with Dr. Berline Lopes on 04/05/22. During which time, the patient reported having occasional twinges in her abdomen since surgery (baseline for her). Pelvic exam performed during this visit noted narrowing towards the very apex of the vagina, which had slightly worsened since her last visit.  Significant changes related to radiation in a couple of atypical vessels were also noted in the region of narrowing near the apex of the vagina. A vaginal biopsy was subsequently collected which showed no evidence of malignancy with acutely inflamed granulation tissue and fibrous tissue.   Of note: The patient has also had some recent issues with hypotension during a routine GI appointment and she was taken off of her BP medications. She later presented to the ED on 02/10/22 with elevated BP and dizziness and was instructed to resume her Benicar for a couple of days. (CT of the head performed in the ED showed no acute intracranial abnormalities).   She also recently  presented to the ED on 06/07/22 with sinus pressure and bilateral ear fullness. Differentials included a viral illness vs allergies, and she was treated with Coricidin HBP, Flonase, and IM Decadron.                          Allergies:  has No Known Allergies.  Meds: Current Outpatient Medications  Medication Sig Dispense Refill   calcium carbonate (OS-CAL) 600 MG TABS tablet Take 600 mg by mouth daily with breakfast.     Cholecalciferol (VITAMIN D) 50 MCG (2000 UT) tablet Take 2,000 Units by mouth daily.     clopidogrel (PLAVIX) 75 MG tablet Take 1 tablet (75 mg total) by mouth daily. 90 tablet 1   FLUoxetine (PROZAC) 10 MG capsule Take 10 mg by mouth daily.      fluticasone (FLONASE) 50 MCG/ACT nasal spray Place 1 spray into both nostrils 2 (two) times daily. 16 g 2   LORazepam (ATIVAN) 1 MG tablet Take 1 tablet (1 mg total) by mouth every 8 (eight) hours as needed for anxiety (for anxiety). 60 tablet 1   losartan-hydrochlorothiazide (HYZAAR) 50-12.5 MG tablet Take 1 tablet by mouth daily.     mesalamine (APRISO) 0.375 g 24 hr capsule TAKE 4 CAPSULES  BY MOUTH ONCE DAILY 120 capsule 5   Multiple Vitamins-Minerals (MULTIVITAMIN WITH MINERALS) tablet Take 1 tablet by mouth daily.     nitroGLYCERIN (NITROSTAT) 0.4 MG SL tablet Place 1 tablet (0.4 mg total) under the tongue every 5 (five) minutes  as needed for chest pain. 25 tablet 3   pantoprazole (PROTONIX) 40 MG tablet Take 1 tablet (40 mg total) by mouth daily. 90 tablet 1   rosuvastatin (CRESTOR) 20 MG tablet Take 1 tablet (20 mg total) by mouth daily. 90 tablet 3   No current facility-administered medications for this encounter.    Physical Findings: The patient is in no acute distress. Patient is alert and oriented.  vitals were not taken for this visit. .  No significant changes. Lungs are clear to auscultation bilaterally. Heart has regular rate and rhythm. No palpable cervical, supraclavicular, or axillary adenopathy. Abdomen soft,  non-tender, normal bowel sounds.  On pelvic examination the external genitalia were unremarkable. A speculum exam was performed. There are no mucosal lesions noted in the vaginal vault. A Pap smear was obtained of the proximal vagina. On bimanual and rectovaginal examination there were no pelvic masses appreciated. ***   Lab Findings: Lab Results  Component Value Date   WBC 6.5 02/11/2022   HGB 11.5 (L) 02/11/2022   HCT 34.1 (L) 02/11/2022   MCV 95.0 02/11/2022   PLT 203 02/11/2022    Radiographic Findings: No results found.  Impression: Early stage carcinosarcoma   The patient is recovering from the effects of radiation.  ***  Plan:  ***   *** minutes of total time was spent for this patient encounter, including preparation, face-to-face counseling with the patient and coordination of care, physical exam, and documentation of the encounter. ____________________________________  Blair Promise, PhD, MD  This document serves as a record of services personally performed by Gery Pray, MD. It was created on his behalf by Roney Mans, a trained medical scribe. The creation of this record is based on the scribe's personal observations and the provider's statements to them. This document has been checked and approved by the attending provider.

## 2022-07-11 ENCOUNTER — Ambulatory Visit
Admission: RE | Admit: 2022-07-11 | Discharge: 2022-07-11 | Disposition: A | Discharge status: Home / Self Care | Payer: Medicare Other | Source: Ambulatory Visit | Attending: Radiation Oncology | Admitting: Radiation Oncology

## 2022-07-11 ENCOUNTER — Telehealth: Payer: Self-pay | Admitting: *Deleted

## 2022-07-11 DIAGNOSIS — C55 Malignant neoplasm of uterus, part unspecified: Secondary | ICD-10-CM

## 2022-07-11 NOTE — Telephone Encounter (Signed)
RETURNED PATIENT'S PHONE CALL, SPOKE WITH PATIENT. ?

## 2022-08-01 NOTE — Progress Notes (Signed)
[name] [name] is here today for follow up post radiation to the pelvic.   They completed their radiation on:  05/11/20     Does the patient complain of any of the following:   Pain: Patient reports intermittent discomfort at times to left lower abdomen.  Abdominal bloating: Yes Diarrhea/Constipation: Diarrhea at times.  Nausea/Vomiting: No Vaginal Discharge: No Blood in Urine or Stool: No Urinary Issues (dysuria/incomplete emptying/ incontinence/ increased frequency/urgency): No Does patient report using vaginal dilator 2-3 times a week and/or sexually active 2-3 weeks: No Post radiation skin changes: Soreness around rectum.    Additional comments: Patient reports her daughter passing away in January 2024 from colon cancer.     BP 132/67 (BP Location: Left Arm, Patient Position: Sitting, Cuff Size: Normal)   Pulse 74   Temp 97.6 F (36.4 C)   Resp 20   Ht '5\' 6"'$  (1.676 m)   Wt 168 lb (76.2 kg)   SpO2 98%   BMI 27.12 kg/m

## 2022-08-05 NOTE — Progress Notes (Signed)
Radiation Oncology         [phone] ________________________________  Name: [name] [name] MRN: [id number]  Date: 08/08/2022  DOB: [birth year]  Follow-Up Visit Note  CC: Rolla Etienne, MD  [name], MD  No diagnosis found.   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 She was last seen here for follow-up on 01/03/22. Since her last visit, the patient followed up with Dr. Berline Lopes on 04/05/22. During which time, the patient reported having occasional twinges in her abdomen since surgery (baseline for her). Pelvic exam performed during this visit noted narrowing towards the very apex of the vagina, which had slightly worsened since her last visit.  Significant changes related to radiation in a couple of atypical vessels were also noted in the region of narrowing near the apex of the vagina. A vaginal biopsy was subsequently collected which showed no evidence of malignancy with acutely inflamed granulation tissue and fibrous tissue.   Of note: The patient has also had some recent issues with hypotension during a routine GI appointment and she was taken off of her BP medications. She later presented to the ED on 02/10/22 with elevated BP and dizziness and was instructed to resume her Benicar for a couple of days. (CT of the head performed in the ED showed no acute intracranial abnormalities).   She also recently presented to the ED on  06/07/22 with sinus pressure and bilateral ear fullness. Differentials included a viral illness vs allergies, and she was treated with Coricidin HBP, Flonase, and IM Decadron.                          Allergies:  has No Known Allergies.  Meds: Current Outpatient Medications  Medication Sig Dispense Refill   calcium carbonate (OS-CAL) 600 MG TABS tablet Take 600 mg by mouth daily with breakfast.     Cholecalciferol (VITAMIN D) 50 MCG (2000 UT) tablet Take 2,000 Units by mouth daily.     clopidogrel (PLAVIX) 75 MG tablet Take 1 tablet (75 mg total) by mouth daily. 90 tablet 1   FLUoxetine (PROZAC) 10 MG capsule Take 10 mg by mouth daily.      fluticasone (FLONASE) 50 MCG/ACT nasal spray Place 1 spray into both nostrils 2 (two) times daily. 16 g 2   LORazepam (ATIVAN) 1 MG tablet Take 1 tablet (1 mg total) by mouth every 8 (eight) hours as needed for anxiety (for anxiety). 60 tablet 1   losartan-hydrochlorothiazide (HYZAAR) 50-12.5 MG tablet Take 1 tablet by mouth daily.     mesalamine (APRISO) 0.375 g 24 hr capsule TAKE 4 CAPSULES  BY MOUTH ONCE DAILY 120 capsule 5   Multiple Vitamins-Minerals (MULTIVITAMIN WITH MINERALS) tablet Take 1 tablet by mouth daily.     nitroGLYCERIN (NITROSTAT) 0.4 MG SL tablet Place 1 tablet (0.4 mg total) under the tongue every 5 (five) minutes as needed for chest pain. 25 tablet 3  pantoprazole (PROTONIX) 40 MG tablet Take 1 tablet (40 mg total) by mouth daily. 90 tablet 1   rosuvastatin (CRESTOR) 20 MG tablet Take 1 tablet (20 mg total) by mouth daily. 90 tablet 3   No current facility-administered medications for this encounter.    Physical Findings: The patient is in no acute distress. Patient is alert and oriented.  vitals were not taken for this visit. .  No significant changes. Lungs are clear to auscultation bilaterally. Heart has regular rate and rhythm. No palpable cervical, supraclavicular, or axillary adenopathy. Abdomen soft, non-tender, normal bowel  sounds.  On pelvic examination the external genitalia were unremarkable. A speculum exam was performed. There are no mucosal lesions noted in the vaginal vault. A Pap smear was obtained of the proximal vagina. On bimanual and rectovaginal examination there were no pelvic masses appreciated. ***   Lab Findings: Lab Results  Component Value Date   WBC 6.5 02/11/2022   HGB 11.5 (L) 02/11/2022   HCT 34.1 (L) 02/11/2022   MCV 95.0 02/11/2022   PLT 203 02/11/2022    Radiographic Findings: No results found.  Impression: Early stage carcinosarcoma   The patient is recovering from the effects of radiation.  ***  Plan:  ***   *** minutes of total time was spent for this patient encounter, including preparation, face-to-face counseling with the patient and coordination of care, physical exam, and documentation of the encounter. ____________________________________  Blair Promise, PhD, MD  This document serves as a record of services personally performed by Gery Pray, MD. It was created on his behalf by Roney Mans, a trained medical scribe. The creation of this record is based on the scribe's personal observations and the provider's statements to them. This document has been checked and approved by the attending provider.

## 2022-08-08 ENCOUNTER — Ambulatory Visit
Admission: RE | Admit: 2022-08-08 | Discharge: 2022-08-08 | Disposition: A | Discharge status: Home / Self Care | Payer: Medicare Other | Source: Ambulatory Visit | Attending: Radiation Oncology | Admitting: Radiation Oncology

## 2022-08-08 ENCOUNTER — Encounter: Payer: Self-pay | Admitting: Radiation Oncology

## 2022-08-08 VITALS — BP 132/67 | HR 74 | Temp 97.6°F | Resp 20 | Ht 66.0 in | Wt 168.0 lb

## 2022-08-08 DIAGNOSIS — Z923 Personal history of irradiation: Secondary | ICD-10-CM | POA: Insufficient documentation

## 2022-08-08 DIAGNOSIS — Z8542 Personal history of malignant neoplasm of other parts of uterus: Secondary | ICD-10-CM | POA: Insufficient documentation

## 2022-08-08 DIAGNOSIS — Z7902 Long term (current) use of antithrombotics/antiplatelets: Secondary | ICD-10-CM | POA: Diagnosis not present

## 2022-08-08 DIAGNOSIS — Z79899 Other long term (current) drug therapy: Secondary | ICD-10-CM | POA: Insufficient documentation

## 2022-08-08 DIAGNOSIS — C55 Malignant neoplasm of uterus, part unspecified: Secondary | ICD-10-CM

## 2022-08-15 IMAGING — XA IR IMAGING GUIDED PORT INSERTION
1 series · 2 of 2 positions shown · non-contrast
Comparison: none

CLINICAL DATA: Uterine carcinosarcoma and need for porta cath to
begin chemotherapy.

[Series 300: ir imaging guided port insertion · 2 of 2 slices shown]
[im 1/2]
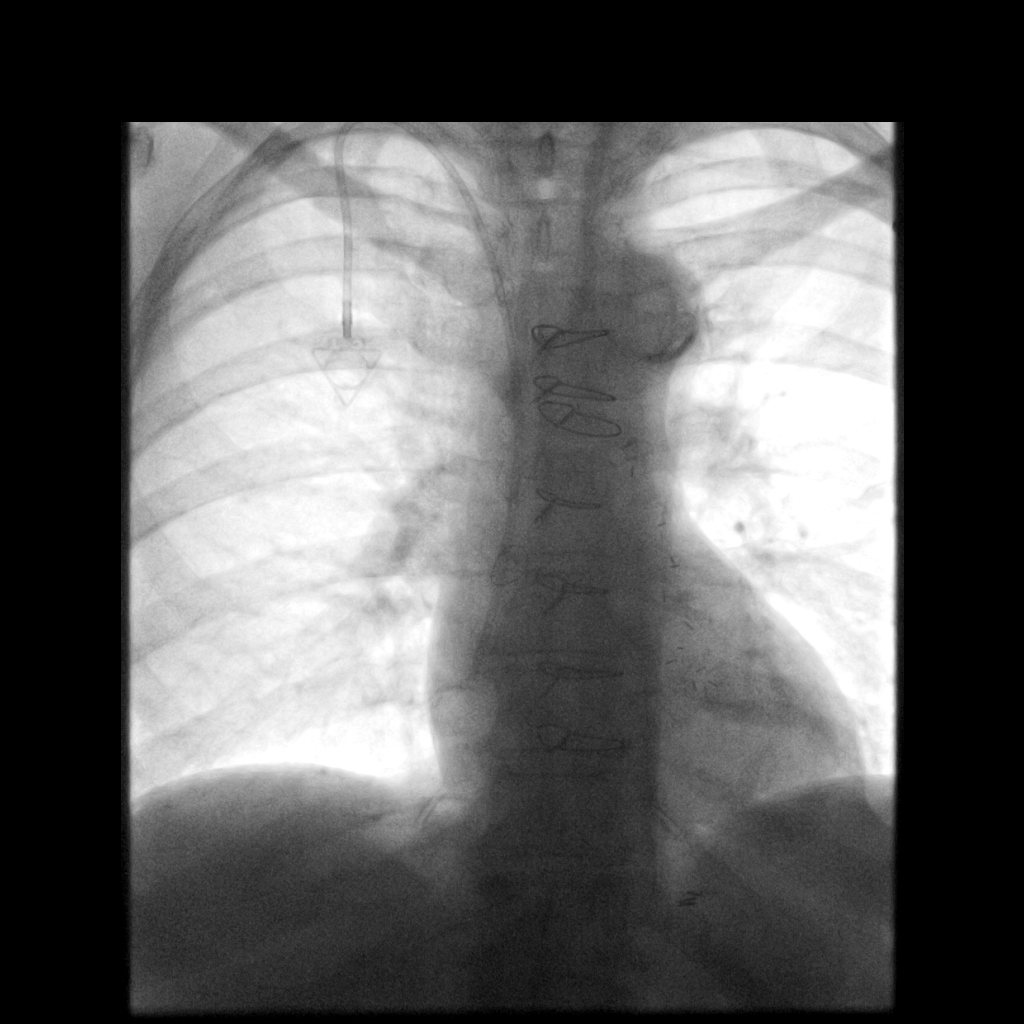
[im 2/2]
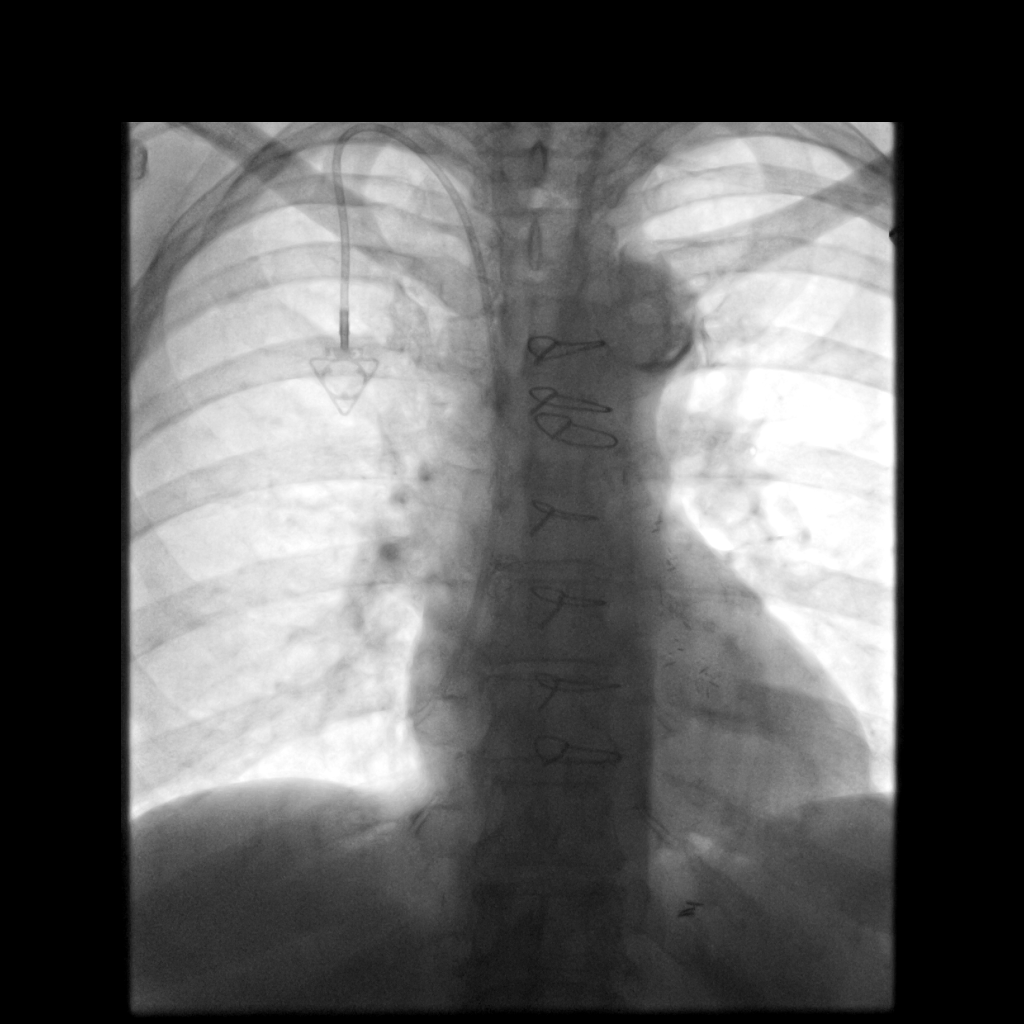

[2 of 2 positions shown; findings below may reference images not displayed]

EXAM:
IMPLANTED PORT A CATH PLACEMENT WITH ULTRASOUND AND FLUOROSCOPIC
GUIDANCE

ANESTHESIA/SEDATION:
2.0 mg IV Versed; 100 mcg IV Fentanyl

Total Moderate Sedation Time:  28 minutes

The patient's level of consciousness and physiologic status were
continuously monitored during the procedure by Radiology nursing.

Additional Medications: 2 g IV Ancef.

FLUOROSCOPY TIME:  12 seconds.  3.0 mGy.

PROCEDURE:
The procedure, risks, benefits, and alternatives were explained to
the patient. Questions regarding the procedure were encouraged and
answered. The patient understands and consents to the procedure. A
time-out was performed prior to initiating the procedure.

Ultrasound was utilized to confirm patency of the right internal
jugular vein. The right neck and chest were prepped with
chlorhexidine in a sterile fashion, and a sterile drape was applied
covering the operative field. Maximum barrier sterile technique with
sterile gowns and gloves were used for the procedure. Local
anesthesia was provided with 1% lidocaine.

After creating a small venotomy incision, a 21 gauge needle was
advanced into the right internal jugular vein under direct,
real-time ultrasound guidance. Ultrasound image documentation was
performed. After securing guidewire access, an 8 Fr dilator was
placed. A J-wire was kinked to measure appropriate catheter length.

A subcutaneous port pocket was then created along the upper chest
wall utilizing sharp and blunt dissection. Portable cautery was
utilized. The pocket was irrigated with sterile saline.

A single lumen power injectable port was chosen for placement. The 8
Fr catheter was tunneled from the port pocket site to the venotomy
incision. The port was placed in the pocket. External catheter was
trimmed to appropriate length based on guidewire measurement.

At the venotomy, an 8 Fr peel-away sheath was placed over a
guidewire. The catheter was then placed through the sheath and the
sheath removed. Final catheter positioning was confirmed and
documented with a fluoroscopic spot image. The port was accessed
with a needle and aspirated and flushed with heparinized saline. The
access needle was removed.

The venotomy and port pocket incisions were closed with subcutaneous
3-0 Monocryl and subcuticular 4-0 Vicryl. Dermabond was applied to
both incisions.

COMPLICATIONS:
COMPLICATIONS
None
FINDINGS: After catheter placement, the tip lies at the Vora junction.
The catheter aspirates normally and is ready for immediate use.
IMPRESSION: Placement of single lumen port a cath via right internal jugular
vein. The catheter tip lies at the Vora junction. A power
injectable port a cath was placed and is ready for immediate use.

## 2022-10-11 ENCOUNTER — Other Ambulatory Visit: Payer: Self-pay | Admitting: Gastroenterology

## 2022-10-18 ENCOUNTER — Ambulatory Visit: Payer: Medicare Other | Attending: Cardiovascular Disease | Admitting: Cardiovascular Disease

## 2022-10-18 ENCOUNTER — Encounter: Payer: Self-pay | Admitting: Cardiovascular Disease

## 2022-10-18 VITALS — BP 122/76 | HR 90 | Ht 62.0 in | Wt 167.6 lb

## 2022-10-18 DIAGNOSIS — E7800 Pure hypercholesterolemia, unspecified: Secondary | ICD-10-CM

## 2022-10-18 DIAGNOSIS — I2510 Atherosclerotic heart disease of native coronary artery without angina pectoris: Secondary | ICD-10-CM | POA: Diagnosis not present

## 2022-10-18 DIAGNOSIS — F419 Anxiety disorder, unspecified: Secondary | ICD-10-CM

## 2022-10-18 DIAGNOSIS — I10 Essential (primary) hypertension: Secondary | ICD-10-CM | POA: Diagnosis not present

## 2022-10-18 MED ORDER — LORAZEPAM 1 MG PO TABS: 1.0000 mg | ORAL_TABLET | Freq: Three times a day (TID) | ORAL | 0 refills | Status: DC | PRN

## 2022-10-18 MED ORDER — NITROGLYCERIN 0.4 MG SL SUBL
0.4000 mg | SUBLINGUAL_TABLET | SUBLINGUAL | 3 refills | Status: AC | PRN
Start: 2022-10-18 — End: ?

## 2022-10-18 MED ORDER — LORAZEPAM 1 MG PO TABS: 1.0000 mg | ORAL_TABLET | Freq: Three times a day (TID) | ORAL | 0 refills | Status: AC | PRN

## 2022-10-18 NOTE — Progress Notes (Signed)
Chief Complaint  Patient presents with   Follow-up    CAD   History of Present Illness: 77 yo female with h/o CAD with acute anterior MI April 1996 with angioplasty, CABG June 1996 (LIMA to LAD, SVG to RCA), stenting Circumflex 2016, HLD, HTN, ulcerative colitis here today for cardiology followup. Last cardiac cath in May 2016 and she was found to have a severe stenosis in the mid Circumflex treated with a drug eluting stent. The LIMA was patent to the LAD and the SVG was patent to the PDA. Echo January 2019 with LVEF=60-65%, grade 2 diastolic dysfunction. She had uterine cancer and she has completed chemotherapy and radiation therapy in January 2022. She had a total hysterectomy. Echo September 2023 with LVEF=55-60%. No significant valve disease. Nuclear stress test September 2023 with no ischemia. Coreg stopped in 2023 secondary to dizziness.   She is here today for follow up. The patient denies any chest pain, dyspnea, palpitations, lower extremity edema, orthopnea, PND, dizziness, near syncope or syncope.    Primary Care Physician: Cindra Presume, MD  Past Medical History:  Diagnosis Date   Adenomatous colon polyp 11/2005   Anginal pain (HCC)    last time 2016   Anxiety    Arthritis    CAD (coronary artery disease)    2V CABG 1996 (LIMA to LAD, SVG to RCA) Dr. Tyrone Sage.; last stress test in 2010   Diabetes mellitus without complication (HCC)    type II   GERD (gastroesophageal reflux disease)    History of radiation therapy    Uterus- [facility] brachytherapy - Dr. Antony Blackbird 04/16/20-05/11/20   Hyperlipidemia    Hypertension    Muscle cramps    and pains   Myocardial infarction (HCC)    1996   Pneumonia    hx of x 2    Ulcerative colitis    Uterine cancer (HCC)     Past Surgical History:  Procedure Laterality Date   BACK SURGERY     lumbar fusion 25 years ago and ruptured disk 10 years ago same area   BACK SURGERY     ruptered disc under lumbar disc   CARDIAC  CATHETERIZATION N/A 10/13/2014   Procedure: Left Heart Cath And Coronary Angiography;  Surgeon: Kathleene Hazel, MD;  Location: [facility];  Service: Cardiovascular;  Laterality: N/A;   CARDIAC CATHETERIZATION N/A 10/13/2014   Procedure: Coronary Stent Intervention;  Surgeon: Kathleene Hazel, MD;  Location: [facility];  Service: Cardiovascular;  Laterality: N/A;   COLONOSCOPY     CORONARY ANGIOPLASTY WITH STENT PLACEMENT  10/13/2014   DES to MID CIRCUMFLEX   CORONARY ARTERY BYPASS GRAFT  1996   x 2   DILATATION & CURETTAGE/HYSTEROSCOPY WITH MYOSURE N/A 01/03/2020   Procedure: DILATATION & CURETTAGE/HYSTEROSCOPY WITH POSSIBLE MYOSURE;  Surgeon: Huel Cote, MD;  Location: [facility] OR;  Service: Gynecology;  Laterality: N/A;   DILATION AND EVACUATION N/A 01/03/2020   Procedure: DILATATION AND EVACUATION;  Surgeon: Huel Cote, MD;  Location: [facility] OR;  Service: Gynecology;  Laterality: N/A;   IR IMAGING GUIDED PORT INSERTION  03/06/2020   IR REMOVAL TUN ACCESS W/ PORT W/O FL MOD SED  08/24/2020   OPERATIVE ULTRASOUND N/A 01/03/2020   Procedure: OPERATIVE ULTRASOUND;  Surgeon: Huel Cote, MD;  Location: [facility] OR;  Service: Gynecology;  Laterality: N/A;   POLYPECTOMY     ROBOTIC ASSISTED TOTAL HYSTERECTOMY WITH BILATERAL SALPINGO OOPHERECTOMY Bilateral 02/04/2020   Procedure: XI  ROBOTIC ASSISTED TOTAL HYSTERECTOMY WITH BILATERAL SALPINGO OOPHORECTOMY, LYMPH NODE DISSECTION;  Surgeon: Carver Fila, MD;  Location: [facility];  Service: Gynecology;  Laterality: Bilateral;   SENTINEL NODE BIOPSY N/A 02/04/2020   Procedure: SENTINEL NODE BIOPSY;  Surgeon: Carver Fila, MD;  Location: [facility];  Service: Gynecology;  Laterality: N/A;   UPPER GASTROINTESTINAL ENDOSCOPY      Current Outpatient Medications  Medication Sig Dispense Refill   calcium carbonate (OS-CAL) 600 MG TABS tablet Take 600 mg by mouth daily with breakfast.     Cholecalciferol (VITAMIN  D) 50 MCG (2000 UT) tablet Take 2,000 Units by mouth daily.     clopidogrel (PLAVIX) 75 MG tablet Take 1 tablet (75 mg total) by mouth daily. 90 tablet 1   FLUoxetine (PROZAC) 10 MG capsule Take 10 mg by mouth daily.      losartan-hydrochlorothiazide (HYZAAR) 50-12.5 MG tablet Take 1 tablet by mouth daily.     mesalamine (APRISO) 0.375 g 24 hr capsule TAKE 4 CAPSULES BY MOUTH ONCE DAILY 120 capsule 5   Multiple Vitamins-Minerals (MULTIVITAMIN WITH MINERALS) tablet Take 1 tablet by mouth daily.     pantoprazole (PROTONIX) 40 MG tablet Take 1 tablet (40 mg total) by mouth daily. 90 tablet 1   rosuvastatin (CRESTOR) 20 MG tablet Take 1 tablet (20 mg total) by mouth daily. 90 tablet 3   LORazepam (ATIVAN) 1 MG tablet Take 1 tablet (1 mg total) by mouth every 8 (eight) hours as needed for anxiety (for anxiety). 60 tablet 0   nitroGLYCERIN (NITROSTAT) 0.4 MG SL tablet Place 1 tablet (0.4 mg total) under the tongue every 5 (five) minutes as needed for chest pain. 25 tablet 3   No current facility-administered medications for this visit.    No Known Allergies  Social History   Socioeconomic History   Marital status: Widowed    Spouse name: Ray   Number of children: 7   Years of education: 12   Highest education level: Not on file  Occupational History   Occupation: retired  Tobacco Use   Smoking status: Former    Years: 20    Types: Cigarettes    Quit date: 10/04/1994    Years since quitting: 28.0   Smokeless tobacco: Never  Vaping Use   Vaping Use: Never used  Substance and Sexual Activity   Alcohol use: No   Drug use: No   Sexual activity: Not Currently    Partners: Male  Other Topics Concern   Not on file  Social History Narrative   Not on file   Social Determinants of Health   Financial Resource Strain: Not on file  Food Insecurity: Not on file  Transportation Needs: Not on file  Physical Activity: Not on file  Stress: Not on file  Social Connections: Not on file   Intimate Partner Violence: Not on file    Family History  Problem Relation Age of Onset   Diabetes Mother    Heart disease Mother    Heart attack Mother 88   Hypertension Mother    Lung cancer Brother    Colon cancer Brother    Breast cancer Sister    Kidney disease Brother        renal cancer   Heart disease Sister    Colon cancer Daughter        Thinks her daughter had negative genetic testing   Hypertension Sister    Stroke Sister    Pancreatitis Other    Breast  cancer Daughter    Stomach cancer Neg Hx     Review of Systems:  As stated in the HPI and otherwise negative.   BP 122/76   Pulse 90   Ht 5\' 2"  (1.575 m)   Wt 76 kg   SpO2 96%   BMI 30.65 kg/m   Physical Examination: General: Well developed, well nourished, NAD  HEENT: OP clear, mucus membranes moist  SKIN: warm, dry. No rashes. Neuro: No focal deficits  Musculoskeletal: Muscle strength 5/5 all ext  Psychiatric: Mood and affect normal  Neck: No JVD, no carotid bruits, no thyromegaly, no lymphadenopathy.  Lungs:Clear bilaterally, no wheezes, rhonci, crackles Cardiovascular: Regular rate and rhythm. No murmurs, gallops or rubs. Abdomen:Soft. Bowel sounds present. Non-tender.  Extremities: No lower extremity edema. Pulses are 2 + in the bilateral DP/PT.  Echo 02/25/22:  1. Left ventricular ejection fraction, by estimation, is 55 to 60%. Left  ventricular ejection fraction by 3D volume is 52 %. The left ventricle has  normal function. The left ventricle has no regional wall motion  abnormalities. Left ventricular diastolic   parameters are consistent with Grade I diastolic dysfunction (impaired  relaxation).   2. Right ventricular systolic function is normal. The right ventricular  size is normal.   3. The mitral valve is normal in structure. No evidence of mitral valve  regurgitation. No evidence of mitral stenosis.   4. The aortic valve is normal in structure. There is mild thickening of  the  aortic valve. Aortic valve regurgitation is not visualized. Aortic  valve sclerosis is present, with no evidence of aortic valve stenosis.   5. The inferior vena cava is normal in size with greater than 50%  respiratory variability, suggesting right atrial pressure of 3 mmHg.   Cardiac cath Oct 13, 2014: Left main: Diffuse 20% stenosis.   Left Anterior Descending Artery: Large caliber vessel that courses to the apex. The proximal vessel is occluded. The mid and distal vessel fills from the patent graft.   Circumflex Artery: Moderate caliber vessel with moderate caliber obtuse marginal branch. The mid vessel has a 99% stenosis.   Right Coronary Artery: Large dominant vessel with 100% mid occlusion. The distal vessel fills from the patent vein graft.  Graft Anatomy:  SVG to PDA is patent LIMA to mid LAD is patent Left Ventricular Angiogram: LVEF=60-65%.   EKG:  EKG is not ordered today. The ekg ordered today demonstrates    Recent Labs: 02/11/2022: BUN 12; Creatinine, Ser 0.94; Hemoglobin 11.5; Platelets 203; Potassium 3.4; Sodium 141   Lipid Panel    Component Value Date/Time   CHOL 174 07/12/2021 1548   TRIG 123 07/12/2021 1548   HDL 55 07/12/2021 1548   CHOLHDL 3.2 07/12/2021 1548   CHOLHDL 3.0 03/24/2015 0938   VLDL 12 03/24/2015 0938   LDLCALC 97 07/12/2021 1548     Wt Readings from Last 3 Encounters:  10/18/22 76 kg  08/08/22 76.2 kg  04/18/22 73.9 kg     Assessment and Plan:   1. CAD without angina: She is s/p 2V CABG in 1996. Last cardiac cath May 2016 at which time a DES was placed in the mid Circumflex. LIMA graft was patent to the LAD and the SVG was patent to the PDA. Nuclear stress test in September 2023 with no ischemia. No chest pain suggestive of angina. Continue Plavix and statin.       2. HTN: BP is well controlled. Continue current meds  3. Hyperlipidemia: Lipids followed  in primary care. Continue statin.       4. Anxiety: We will fill her Ativan to use  prn as she has had trouble getting this filled in her new primary care practice.   Labs/ tests ordered today include: No orders of the defined types were placed in this encounter.  Disposition:   Follow up with me in 12 months  Signed, Verne Carrow, MD 10/18/2022 10:46 AM    [facility] [address] [place], [place], [place]  [address] Phone: [phone]; Fax: [phone]

## 2022-10-18 NOTE — Patient Instructions (Signed)
Medication Instructions:  Your physician recommends that you continue on your current medications as directed. Please refer to the Current Medication list given to you today.  *If you need a refill on your cardiac medications before your next appointment, please call your pharmacy*   Lab Work: None ordered   If you have labs (blood work) drawn today and your tests are completely normal, you will receive your results only by: MyChart Message (if you have MyChart) OR A paper copy in the mail If you have any lab test that is abnormal or we need to change your treatment, we will call you to review the results.   Testing/Procedures: None ordered    Follow-Up: At [facility], you and your health needs are our priority.  As part of our continuing mission to provide you with exceptional heart care, we have created designated Provider Care Teams.  These Care Teams include your primary Cardiologist (physician) and Advanced Practice Providers (APPs -  Physician Assistants and Nurse Practitioners) who all work together to provide you with the care you need, when you need it.  We recommend signing up for the patient portal called "MyChart".  Sign up information is provided on this After Visit Summary.  MyChart is used to connect with patients for Virtual Visits (Telemedicine).  Patients are able to view lab/test results, encounter notes, upcoming appointments, etc.  Non-urgent messages can be sent to your provider as well.   To learn more about what you can do with MyChart, go to [email or web address].    Your next appointment:   12 month(s)  Provider:   Christopher McAlhany, MD     Other Instructions   

## 2022-11-01 ENCOUNTER — Telehealth: Payer: Self-pay | Admitting: *Deleted

## 2022-11-01 NOTE — Telephone Encounter (Signed)
[name] [name] called the office to schedule a follow up appointment with Dr. Eugene Garnet. [name] [name] was given an appointment on Friday, July 12 at 3:15. Pt agreed to date and time. No further questions or concerns at this time.

## 2022-12-22 ENCOUNTER — Encounter: Payer: Self-pay | Admitting: Gynecologic Oncology

## 2022-12-23 ENCOUNTER — Inpatient Hospital Stay: Payer: Medicare Other | Attending: Gynecologic Oncology | Admitting: Gynecologic Oncology

## 2022-12-23 ENCOUNTER — Encounter: Payer: Self-pay | Admitting: Gynecologic Oncology

## 2022-12-23 ENCOUNTER — Other Ambulatory Visit: Payer: Self-pay

## 2022-12-23 VITALS — BP 137/69 | HR 87 | Temp 97.8°F | Resp 16 | Wt 166.0 lb

## 2022-12-23 DIAGNOSIS — B379 Candidiasis, unspecified: Secondary | ICD-10-CM

## 2022-12-23 DIAGNOSIS — Z923 Personal history of irradiation: Secondary | ICD-10-CM | POA: Insufficient documentation

## 2022-12-23 DIAGNOSIS — B372 Candidiasis of skin and nail: Secondary | ICD-10-CM | POA: Insufficient documentation

## 2022-12-23 DIAGNOSIS — Z8542 Personal history of malignant neoplasm of other parts of uterus: Secondary | ICD-10-CM | POA: Diagnosis present

## 2022-12-23 DIAGNOSIS — Z90710 Acquired absence of both cervix and uterus: Secondary | ICD-10-CM | POA: Diagnosis not present

## 2022-12-23 DIAGNOSIS — Z90722 Acquired absence of ovaries, bilateral: Secondary | ICD-10-CM | POA: Insufficient documentation

## 2022-12-23 DIAGNOSIS — Z9221 Personal history of antineoplastic chemotherapy: Secondary | ICD-10-CM | POA: Insufficient documentation

## 2022-12-23 DIAGNOSIS — C55 Malignant neoplasm of uterus, part unspecified: Secondary | ICD-10-CM

## 2022-12-23 MED ORDER — NYSTATIN 100000 UNIT/GM EX POWD: 1.0000 | Freq: Three times a day (TID) | CUTANEOUS | 1 refills | Status: AC

## 2022-12-23 NOTE — Progress Notes (Signed)
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 Severe distension of the endometrium highly suspicious for cervical obstruction or stenosis possibly due to a neoplastic process in this postmenopausal female with history of vaginal bleeding. Further evaluation with hysteroscopy is  recommended. 2. Diarrheal state. Correlation with clinical exam and stool cultures recommended. No bowel obstruction. Normal appendix.   01/03/2020 Surgery   D&C A. ENDOMETRIUM, CURETTAGE:  -  Poorly differentiated malignancy  -  See comment  COMMENT:  The material consists of a poorly differentiated malignancy with extensive necrosis.  By immunohistochemistry, p16 is positive and there is focal cytokeratin AE1/3 positivity but negative for cytokeratin 7, cytokeratin 5/6, CD20, CD3, p53, WT-1, Pax-8, and S100. The differential includes poorly differentiated carcinoma and high-grade stromal sarcoma.    02/04/2020 Surgery   Robotic-assisted laparoscopic total hysterectomy with bilateral salpingoophorectomy, SLN biopsy, left pelvic LND   On EUA, 10cm mobile uterus. ON intra-abdominal entry, normal upper abdominal survey. Normal appearing omentum, small and large bowel. Uterus 10cm and bulbous. Tortuous and hyperemic ovarian vasculature. Some tubal metaplasia vs. Metastatic disease. Normal appearing ovaries. Mapping successful to deep right obturator SLN and presacral SLN. NO mapping on left. Enlarge deep obturator lymph on the left. Given high grade histology, short small bowel mesentery, and recent COVID infection, decision made not to proceed with left PA LND (would have likely required open procedure. NO intra-abdominal or pelvic evidence of disease.    02/04/2020  Pathology Results   FINAL MICROSCOPIC DIAGNOSIS:   A. SENTINEL LYMPH NODE, RIGHT OBTURATOR, EXCISION:  -  No carcinoma identified in one lymph node (0/1)  -  See comment   B. UTERUS AND BILATERAL FALLOPIAN TUBES AND OVARIES, HYSTERECTOMY AND  BILATERAL SALPINGO-OOPHORECTOMY:  -  Carcinosarcoma confined to the uterus and involving less than half of the myometrium  -  See oncology table and comment below   C. LYMPH NODES, LEFT PELVIC, REGIONAL RESECTION:  -  No carcinoma identified in three lymph nodes (0/3)   D. SENTINEL LYMPH NODE, PRESACRAL, EXCISION:  -  No carcinoma identified in one lymph node (0/1)   COMMENT:   A.  Cytokeratin AE1/3 was performed on the sentinel lymph nodes to exclude micrometastasis.  There is no evidence of metastatic carcinoma by immunohistochemistry.   B.  The tumor consists of poorly cohesive epithelioid and rhabdoid cells.  A panel of immunohistochemistry (p16, f, p53, PAX 8, SMA, CD10, CD45, CD 99, CD117, CD138, cytokeratin 7, cyclin D1, cytokeratin AE1/3, EMA and GATA3) is performed to better classify this neoplasm.  By immunohistochemistry, the neoplastic cells are positive for p16, p53 and  PAX 8.  The epithelioid cells are positive for cytokeratin AE1/3 and EMA while the rhabdoid cells show positivity for desmin. Overall, the immunophenotype and morphology supports the diagnosis of carcinosarcoma.    ONCOLOGY TABLE:   UTERUS, CARCINOMA OR CARCINOSARCOMA   Procedure: Total hysterectomy and bilateral salpingo-oophorectomy  Histologic type: Carcinosarcoma  Histologic Grade: FIGO grade 3  Myometrial invasion: Estimated to be less than 50%  Uterine Serosa Involvement: Not identified  Cervical stromal involvement: Not identified  Extent of involvement of other organs: Not identified  Lymphovascular invasion: Not identified  Regional Lymph Nodes:       Examined:  2 Sentinel                               3 non-sentinel                                5 total        Lymph nodes with metastasis: 0        Isolated tumor cells (<0.2 mm): 0        Micrometastasis:  (>0.2 mm and < 2.0 mm): 0        Macrometastasis: (>2.0 mm): 0        Extracapsular extension: N/A  Representative Tumor Block: B1  MMR / MSI testing: Will be ordered  Pathologic Stage Classification (pTNM, AJCC 8th edition):  pT1a, pN0  Comments: See above    02/04/2020 Cancer Staging   Staging form: Corpus Uteri - Carcinoma and Carcinosarcoma, AJCC 8th Edition - Clinical stage from 02/04/2020: FIGO Stage IA (cT1a, cN0(sn), cM0) - Signed by Carver Fila, MD on 02/12/2020   03/06/2020 Imaging   Placement of single lumen port a cath via right internal jugular vein. The catheter tip lies at the cavo-atrial junction. A power injectable port a cath was placed and is ready for immediate use.   03/12/2020 -  Chemotherapy   The patient had carboplatin and taxol for chemotherapy treatment.     04/16/2020 - 05/11/2020 Radiation Therapy   04/16/2020 through 05/11/2020 Site Technique Total Dose (Gy) Dose per Fx (Gy) Completed Fx Beam Energies  Vagina: Pelvis HDR-brachy 30/30 6 5/5 Ir-192       08/10/2020 Imaging   No evidence of recurrent or metastatic carcinoma within the abdomen or pelvis.   Aortic Atherosclerosis (ICD10-I70.0).   08/24/2020 Procedure   Successful removal of implanted Port-A-Cath.     Interval History: Patient reports overall doing well.  She denies any abdominal or pelvic pain.  She is struggled to use her vaginal dilator because she is spending a lot of time with her children.  Used it twice this week, had some spotting the first time she used it, and then the second.  Denies any change to bowel or bladder function.  Endorses a good appetite.  Has noticed some redness along her right groin, thought she felt a small bump there this morning.  Past Medical/Surgical History: Past Medical History:  Diagnosis Date   Adenomatous colon polyp 11/2005   Anginal  pain (HCC)    last time 2016   Anxiety    Arthritis    CAD (coronary artery disease)    2V CABG 1996 (LIMA to LAD, SVG to RCA) Dr. Tyrone Sage.; last stress test in 2010   Diabetes mellitus without complication (HCC)    type II   GERD (gastroesophageal reflux disease)    History of radiation therapy    Uterus- [facility] brachytherapy - Dr. Antony Blackbird 04/16/20-05/11/20   Hyperlipidemia    Hypertension    Muscle cramps    and pains   Myocardial infarction (HCC)    1996   Pneumonia    hx of x 2    Ulcerative colitis    Uterine cancer (HCC)     Past Surgical History:  Procedure Laterality Date   BACK SURGERY     lumbar fusion 25 years ago and ruptured disk 10 years ago same area   BACK SURGERY  ruptered disc under lumbar disc   CARDIAC CATHETERIZATION N/A 10/13/2014   Procedure: Left Heart Cath And Coronary Angiography;  Surgeon: Kathleene Hazel, MD;  Location: [facility];  Service: Cardiovascular;  Laterality: N/A;   CARDIAC CATHETERIZATION N/A 10/13/2014   Procedure: Coronary Stent Intervention;  Surgeon: Kathleene Hazel, MD;  Location: [facility];  Service: Cardiovascular;  Laterality: N/A;   COLONOSCOPY     CORONARY ANGIOPLASTY WITH STENT PLACEMENT  10/13/2014   DES to MID CIRCUMFLEX   CORONARY ARTERY BYPASS GRAFT  1996   x 2   DILATATION & CURETTAGE/HYSTEROSCOPY WITH MYOSURE N/A 01/03/2020   Procedure: DILATATION & CURETTAGE/HYSTEROSCOPY WITH POSSIBLE MYOSURE;  Surgeon: Huel Cote, MD;  Location[facility];  Service: Gynecology;  Laterality: N/A;   DILATION AND EVACUATION N/A 01/03/2020   Procedure: DILATATION AND EVACUATION;  Surgeon: Huel Cote, MD;  Location: [facility] OR;  Service: Gynecology;  Laterality: N/A;   IR IMAGING GUIDED PORT INSERTION  03/06/2020   IR REMOVAL TUN ACCESS W/ PORT W/O FL MOD SED  08/24/2020   OPERATIVE ULTRASOUND N/A 01/03/2020   Procedure: OPERATIVE ULTRASOUND;  Surgeon: Huel Cote, MD;  Location: [facility] OR;   Service: Gynecology;  Laterality: N/A;   POLYPECTOMY     ROBOTIC ASSISTED TOTAL HYSTERECTOMY WITH BILATERAL SALPINGO OOPHERECTOMY Bilateral 02/04/2020   Procedure: XI ROBOTIC ASSISTED TOTAL HYSTERECTOMY WITH BILATERAL SALPINGO OOPHORECTOMY, LYMPH NODE DISSECTION;  Surgeon: Carver Fila, MD;  Location: [facility] ORS;  Service: Gynecology;  Laterality: Bilateral;   SENTINEL NODE BIOPSY N/A 02/04/2020   Procedure: SENTINEL NODE BIOPSY;  Surgeon: Carver Fila, MD;  Location: [facility];  Service: Gynecology;  Laterality: N/A;   UPPER GASTROINTESTINAL ENDOSCOPY      Family History  Problem Relation Age of Onset   Diabetes Mother    Heart disease Mother    Heart attack Mother 63   Hypertension Mother    Breast cancer Sister    Heart disease Sister    Hypertension Sister    Stroke Sister    Lung cancer Brother    Colon cancer Brother    Kidney disease Brother        renal cancer   Colon cancer Daughter        Thinks her daughter had negative genetic testing   Breast cancer Daughter    Pancreatitis Other    Stomach cancer Neg Hx     Social History   Socioeconomic History   Marital status: Widowed    Spouse name: Ray   Number of children: 7   Years of education: 12   Highest education level: Not on file  Occupational History   Occupation: retired  Tobacco Use   Smoking status: Former    Current packs/day: 0.00    Types: Cigarettes    Start date: 10/04/1974    Quit date: 10/04/1994    Years since quitting: 28.2   Smokeless tobacco: Never  Vaping Use   Vaping status: Never Used  Substance and Sexual Activity   Alcohol use: No   Drug use: No   Sexual activity: Not Currently    Partners: Male  Other Topics Concern   Not on file  Social History Narrative   Not on file   Social Determinants of Health   Financial Resource Strain: Not on file  Food Insecurity: Not on file  Transportation Needs: Not on file  Physical Activity: Not on file  Stress: Not on file  Social  Connections: Not  on file    Current Medications:  Current Outpatient Medications:    calcium carbonate (OS-CAL) 600 MG TABS tablet, Take 600 mg by mouth daily with breakfast., Disp: , Rfl:    Cholecalciferol (VITAMIN D) 50 MCG (2000 UT) tablet, Take 2,000 Units by mouth daily., Disp: , Rfl:    clopidogrel (PLAVIX) 75 MG tablet, Take 1 tablet (75 mg total) by mouth daily., Disp: 90 tablet, Rfl: 1   cyclobenzaprine (FLEXERIL) 5 MG tablet, Take 5 mg by mouth 3 (three) times daily as needed for muscle spasms., Disp: , Rfl:    FLUoxetine (PROZAC) 10 MG capsule, Take 10 mg by mouth daily. , Disp: , Rfl:    LORazepam (ATIVAN) 1 MG tablet, Take 1 tablet (1 mg total) by mouth every 8 (eight) hours as needed for anxiety (for anxiety)., Disp: 60 tablet, Rfl: 0   losartan-hydrochlorothiazide (HYZAAR) 50-12.5 MG tablet, Take 1 tablet by mouth daily., Disp: , Rfl:    mesalamine (APRISO) 0.375 g 24 hr capsule, TAKE 4 CAPSULES BY MOUTH ONCE DAILY, Disp: 120 capsule, Rfl: 5   Multiple Vitamins-Minerals (MULTIVITAMIN WITH MINERALS) tablet, Take 1 tablet by mouth daily., Disp: , Rfl:    nitroGLYCERIN (NITROSTAT) 0.4 MG SL tablet, Place 1 tablet (0.4 mg total) under the tongue every 5 (five) minutes as needed for chest pain., Disp: 25 tablet, Rfl: 3   nystatin (MYCOSTATIN/NYSTOP) powder, Apply 1 Application topically 3 (three) times daily., Disp: 15 g, Rfl: 1   pantoprazole (PROTONIX) 40 MG tablet, Take 1 tablet (40 mg total) by mouth daily., Disp: 90 tablet, Rfl: 1   rosuvastatin (CRESTOR) 20 MG tablet, Take 1 tablet (20 mg total) by mouth daily., Disp: 90 tablet, Rfl: 3  Review of Systems: Denies appetite changes, fevers, chills, fatigue, unexplained weight changes. Denies hearing loss, neck lumps or masses, mouth sores, ringing in ears or voice changes. Denies cough or wheezing.  Denies shortness of breath. Denies chest pain or palpitations. Denies leg swelling. Denies abdominal distention, pain, blood in  stools, constipation, diarrhea, nausea, vomiting, or early satiety. Denies pain with intercourse, dysuria, frequency, hematuria or incontinence. Denies hot flashes, pelvic pain, vaginal bleeding or vaginal discharge.   Denies joint pain, back pain or muscle pain/cramps. Denies itching, rash, or wounds. Denies dizziness, headaches, numbness or seizures. Denies swollen lymph nodes or glands, denies easy bruising or bleeding. Denies anxiety, depression, confusion, or decreased concentration.  Physical Exam: BP 137/69 (BP Location: Left Arm, Patient Position: Sitting)   Pulse 87   Temp 97.8 F (36.6 C) (Oral)   Resp 16   Wt 166 lb (75.3 kg)   SpO2 99%   BMI 30.36 kg/m  General: Alert, oriented, no acute distress. HEENT: Normocephalic, atraumatic, sclera anicteric. Chest: Clear to auscultation bilaterally.  No wheezes or rhonchi. Cardiovascular: Regular rate and rhythm, no murmurs. Abdomen: Obese, soft, nontender.  Normoactive bowel sounds.  No masses or hepatosplenomegaly appreciated.  Well-healed incisions. Extremities: Grossly normal range of motion.  Warm, well perfused.  No edema bilaterally. Skin: No rashes or lesions noted. Lymphatics: No cervical, supraclavicular, or inguinal adenopathy.  There is some mild erythema along the right groin without obvious skin lesion or palpable nodule.  Findings look most consistent with Candida infection. GU: Normal appearing external genitalia without erythema, excoriation, or lesions.  Speculum exam reveals moderately atrophic vaginal mucosa, no lesions or masses, no bleeding or discharge.  Bimanual exam reveals cuff intact, no nodularity or masses.  There is more narrowing towards the very apex of  the vagina, from her last visit.  On speculum exam, I am unable to see the vaginal apex and on bimanual exam unable to palpate the apex.  What I am able to palpate of the vagina is smooth without nodularity.  On rectovaginal exam, these findings are  confirmed.   Laboratory & Radiologic Studies: 03/2022: vaginal biopsy - acutely inflamed granulation tissue and fibroid tissue, no malignancy  Assessment & Plan: [name] [name] is a 6 y.o. 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 MS stable. P53 mutated.   Patient is overall doing well.  NED on exam today although I am unable to palpate or see the top of her vagina.  In the setting of her high risk cancer, plan for CT scan.  Discussed findings on exam which I think are related to Candida infection in the setting of hot temperatures.  Prescription for nystatin sent in and I asked the patient to let me know if symptoms do not improve or resolve over the next couple of weeks.   We discussed signs and symptoms that would be concerning for disease recurrence, and she knows to call if she develops any of these sooner than her next scheduled visit.  Per NCCN recommendations, we will continue with visits every 6 months since she is more than 2 years from completion of adjuvant treatment.  She will see Dr. Roselind Messier in 6 months.  Asked her to call my office after her visit with Dr. Roselind Messier to schedule a visit to see me next July.   22 minutes of total time was spent for this patient encounter, including preparation, face-to-face counseling with the patient and coordination of care, and documentation of the encounter.  Eugene Garnet, MD  Division of Gynecologic Oncology  Department of Obstetrics and Gynecology  [facility]

## 2022-12-23 NOTE — Patient Instructions (Signed)
It was good to see you today.  I do not see or feel any evidence of cancer recurrence on your exam.  I will see you for follow-up in 12 months.  Please call me after your visit with Dr. Roselind Messier to schedule your visit to see me this time next year.  The narrowing towards the top of your vagina, I cannot feel or see the very top of the vagina.  We are going to get a CT scan given your high risk cancer to assure no evidence of recurrence.  I have sent in a prescription for nystatin powder to treat the area along your right groin.  Please let me know if this does not help resolve your symptoms.  Try to keep this area clean and dry.  As always, if you develop any new and concerning symptoms before your next visit, please call to see me sooner.

## 2022-12-26 ENCOUNTER — Telehealth: Payer: Self-pay | Admitting: *Deleted

## 2022-12-26 NOTE — Telephone Encounter (Signed)
Per Dr Pricilla Holm and patient request patient scheduled for a CT scan on 7/29 at 3:30 pm with a 1:30 pm arrival. Patient given date/time/instructions

## 2023-01-09 ENCOUNTER — Ambulatory Visit (HOSPITAL_COMMUNITY)
Admission: RE | Admit: 2023-01-09 | Discharge: 2023-01-09 | Disposition: A | Discharge status: Home / Self Care | Payer: Medicare Other | Source: Ambulatory Visit | Attending: Gynecologic Oncology | Admitting: Gynecologic Oncology

## 2023-01-09 DIAGNOSIS — C55 Malignant neoplasm of uterus, part unspecified: Secondary | ICD-10-CM | POA: Diagnosis present

## 2023-01-09 LAB — POCT I-STAT CREATININE: Creatinine, Ser: 1.2 mg/dL — ABNORMAL HIGH (ref 0.44–1.00)

## 2023-01-09 MED ORDER — IOHEXOL 9 MG/ML PO SOLN
ORAL | Status: AC
  Filled 2023-01-09: qty 1000

## 2023-01-09 MED ORDER — IOHEXOL 9 MG/ML PO SOLN
1000.0000 mL | ORAL | Status: AC
  Administered 2023-01-09: 1000 mL via ORAL

## 2023-01-09 MED ORDER — IOHEXOL 300 MG/ML  SOLN
100.0000 mL | Freq: Once | INTRAMUSCULAR | Status: AC | PRN
Start: 2023-01-09 — End: 2023-01-09
  Administered 2023-01-09: 100 mL via INTRAVENOUS

## 2023-01-16 ENCOUNTER — Telehealth: Payer: Self-pay | Admitting: *Deleted

## 2023-01-16 NOTE — Telephone Encounter (Signed)
-----   Message from [name] [name] sent at 01/16/2023  4:48 PM EDT ----- Please let the patient know scan is back - I released it with a message that there is no evidence of recurrent cancer. Thank you

## 2023-01-16 NOTE — Progress Notes (Signed)
Please let the patient know scan is back - I released it with a message that there is no evidence of recurrent cancer. Thank you

## 2023-01-16 NOTE — Telephone Encounter (Signed)
Spoke with [name] [name] and relayed message from Dr. Pricilla Holm that her scan is back and she released it with a message in MyChart that there is no evidence of recurrent cancer. Pt was very delighted and said, "Give Dr. Pricilla Holm a Big Hug" Pt thanked the office for calling. Pt mentioned her increased creatine that resulted in MyChart, Pt was advised the importance of keeping herself well hydrated. Pt verbalized understanding and had no further concerns or questions at this time.

## 2023-01-19 IMAGING — CT CT ABD-PELV W/ CM
2 of 5 series · 16 of 46 positions shown, 18 images · IV contrast (OMNIPAQUE)
Comparison: 01/01/2020

CLINICAL DATA: Follow-up uterine carcinosarcoma. Status post
hysterectomy, chemotherapy, and radiation therapy.

EXAM:
CT ABDOMEN AND PELVIS WITH CONTRAST
TECHNIQUE: Multidetector CT imaging of the abdomen and pelvis was performed
using the standard protocol following bolus administration of
intravenous contrast.
CONTRAST:  100mL OMNIPAQUE IOHEXOL 300 MG/ML  SOLN

[Series 2: axial st · axial · 0.88mm/px · z∈[+1097,+1497]mm · 13 of 94 slices shown, 15 images]
[im 7/94  soft-tissue]
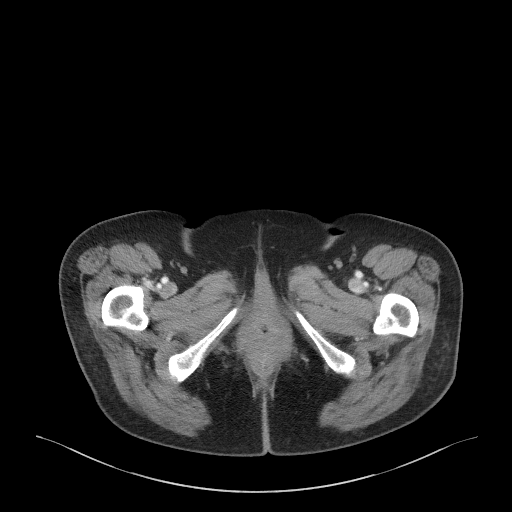
[im 7/94  bone]
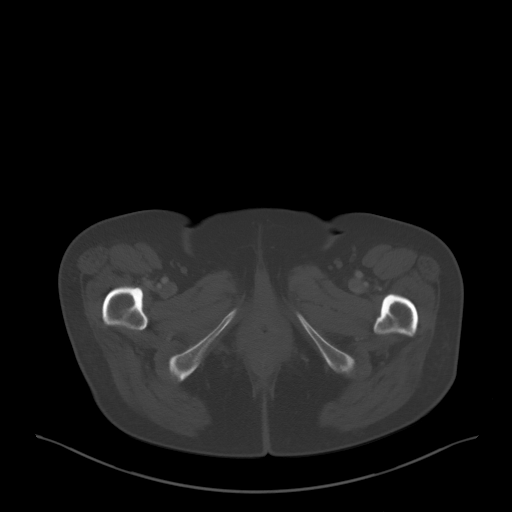
[im 14/94  soft-tissue]
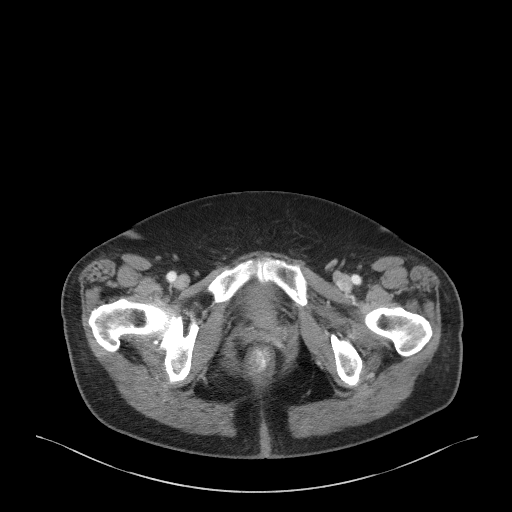
[im 20/94  soft-tissue]
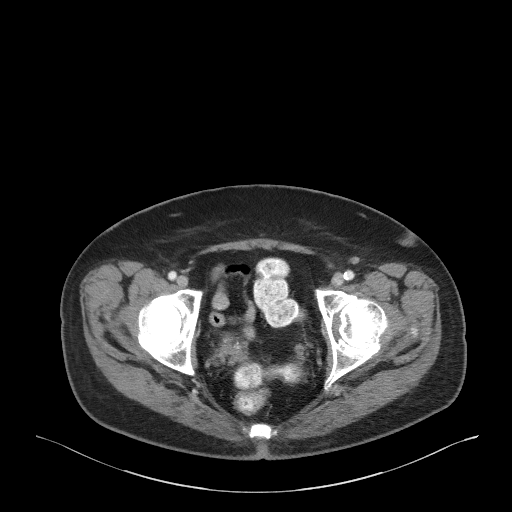
[im 27/94  soft-tissue]
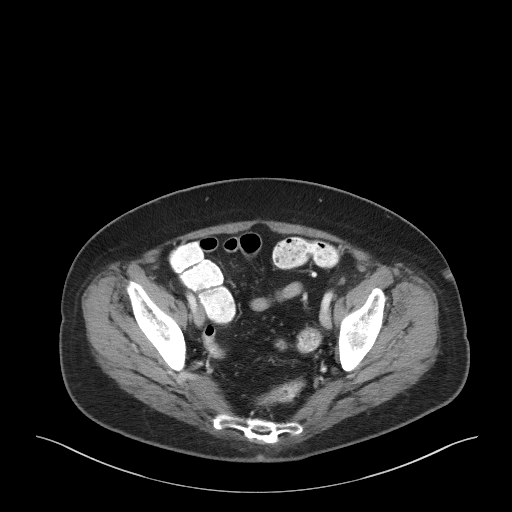
[im 34/94  soft-tissue]
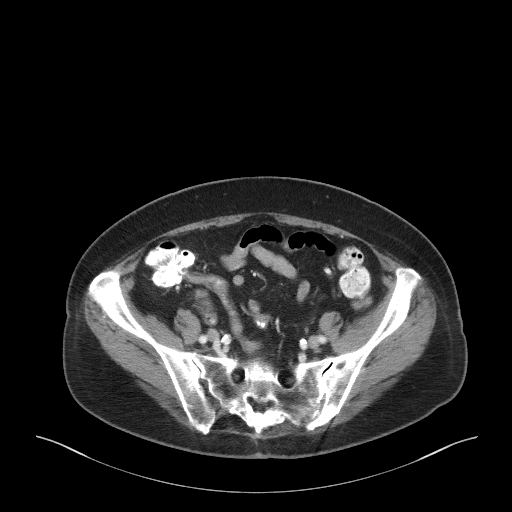
[im 40/94  soft-tissue]
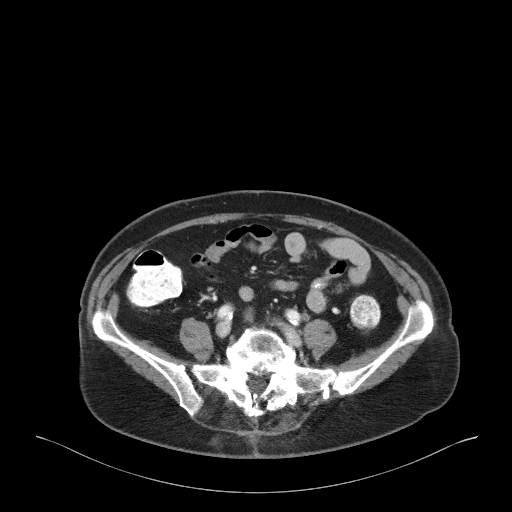
[im 47/94  soft-tissue]
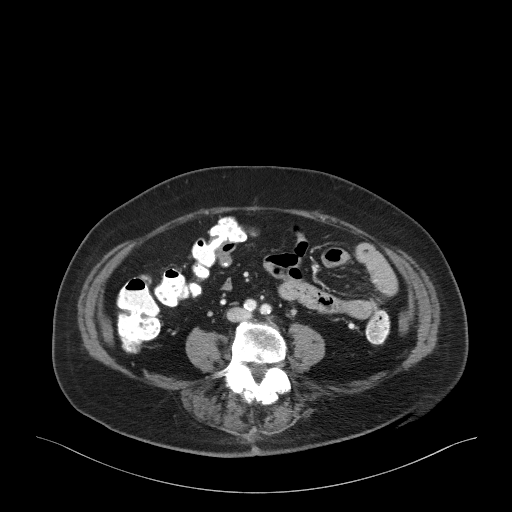
[im 54/94  soft-tissue]
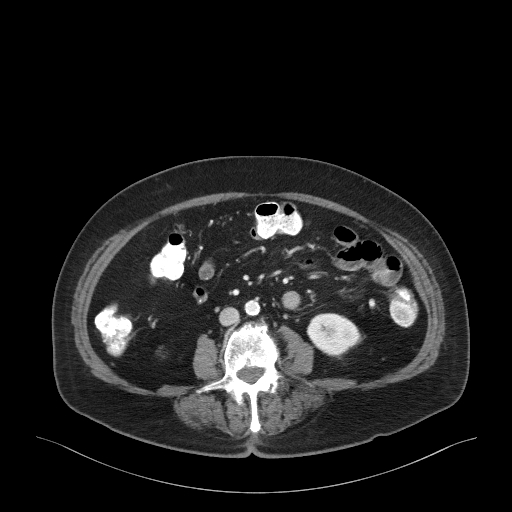
[im 60/94  soft-tissue]
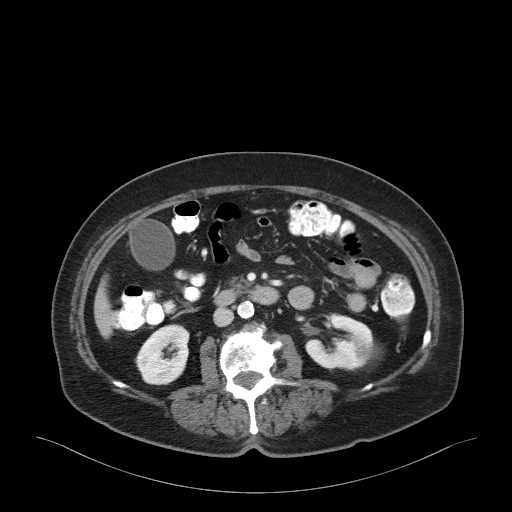
[im 60/94  bone]
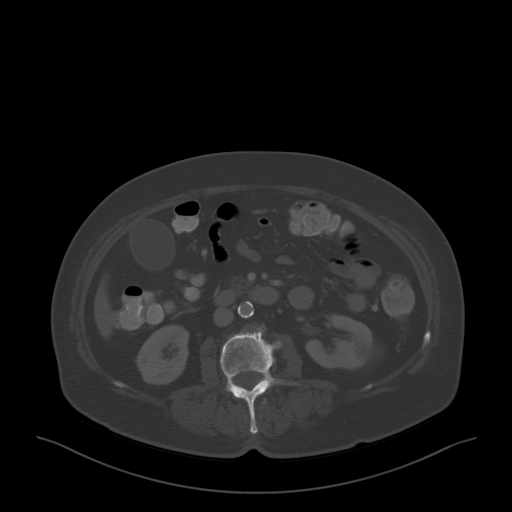
[im 67/94  soft-tissue]
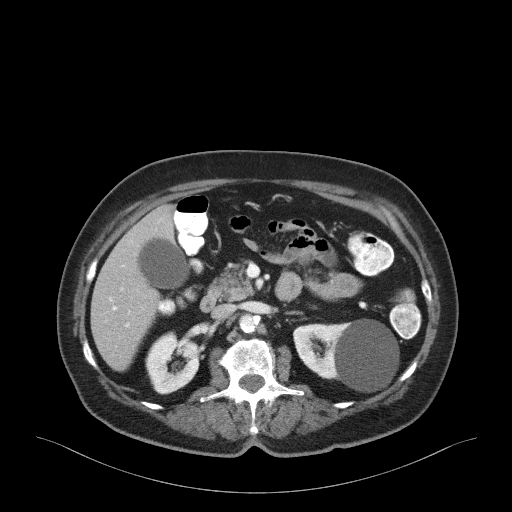
[im 74/94  soft-tissue]
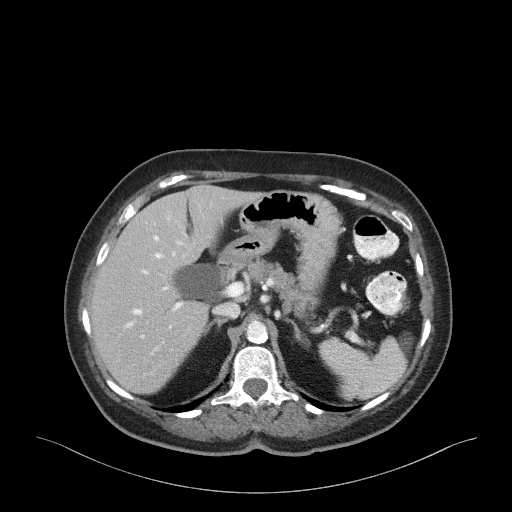
[im 80/94  soft-tissue]
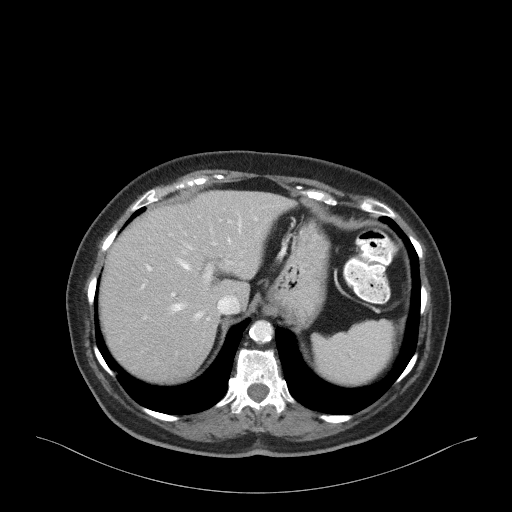
[im 87/94  soft-tissue]
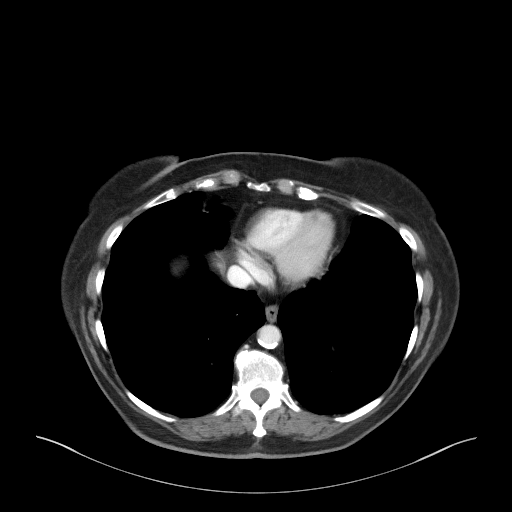

[Series 4: coronal st · coronal · 0.84mm/px · 3 of 83 slices shown]
[im 28/83  soft-tissue]
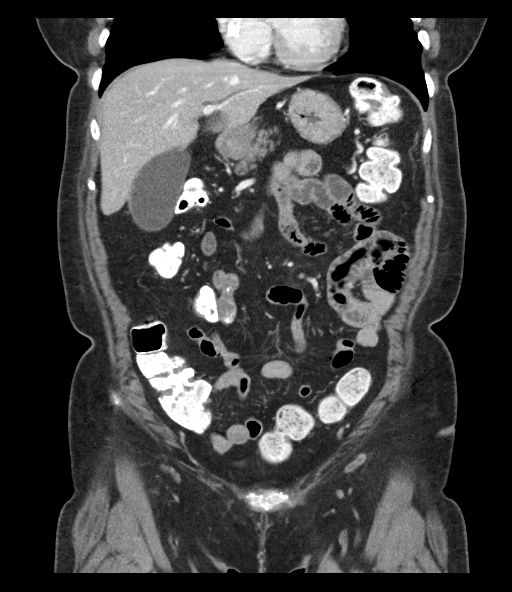
[im 37/83  soft-tissue]
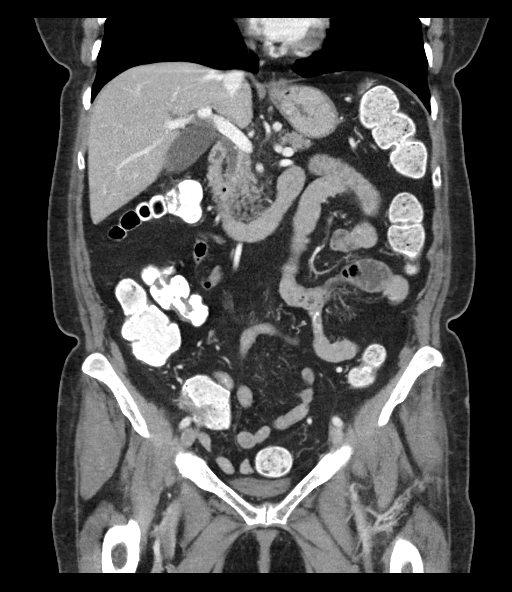
[im 46/83  soft-tissue]
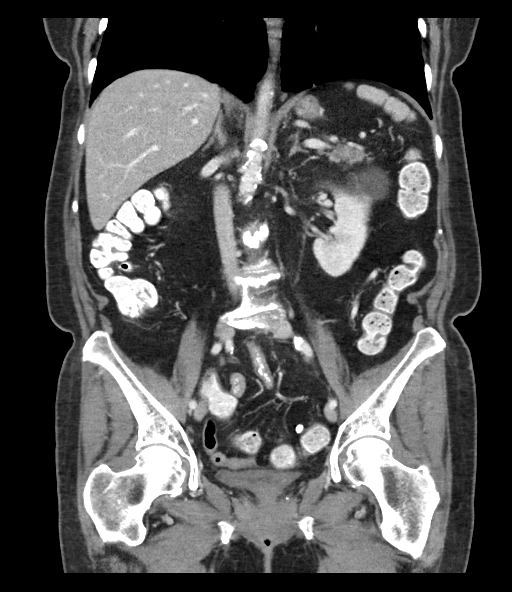

[16 of 46 positions shown; findings below may reference images not displayed]

FINDINGS: Lower Chest: No acute findings.

Hepatobiliary: No hepatic masses identified. Gallbladder is
unremarkable. No evidence of biliary ductal dilatation.

Pancreas:  No mass or inflammatory changes.

Spleen: Within normal limits in size and appearance.

Adrenals/Urinary Tract: No masses identified. Stable left renal
cysts. No evidence of ureteral calculi or hydronephrosis. Urinary
bladder is empty.

Stomach/Bowel: No evidence of obstruction, inflammatory process or
abnormal fluid collections.

Vascular/Lymphatic: No pathologically enlarged lymph nodes. No
abdominal aortic aneurysm. Aortic atherosclerotic calcification
noted.

Reproductive: Prior hysterectomy noted. Adnexal regions are
unremarkable in appearance.

Other:  None.

Musculoskeletal:  No suspicious bone lesions identified.
IMPRESSION: No evidence of recurrent or metastatic carcinoma within the abdomen
or pelvis.

Aortic Atherosclerosis ([id number]).

## 2023-01-26 ENCOUNTER — Ambulatory Visit: Payer: Medicare Other | Admitting: Cardiovascular Disease

## 2023-03-21 ENCOUNTER — Telehealth: Payer: Self-pay | Admitting: Cardiovascular Disease

## 2023-03-21 ENCOUNTER — Encounter: Payer: Self-pay | Admitting: *Deleted

## 2023-03-21 NOTE — Telephone Encounter (Signed)
Spoke with patient and she stated she changed insurance and they need to know what is her cardiologist prognosis. She states it can be mailed to her home address

## 2023-03-21 NOTE — Telephone Encounter (Signed)
[name] Hazel, MD  Lendon Ka, RN Caller: Unspecified (Today, 12:54 PM) We can let her know that her diagnosis is Coronary artery disease and that her prognosis is good. Chris ______________________________________________  Letter placed in the outgoing mail with the above info to patient per her request.

## 2023-03-21 NOTE — Telephone Encounter (Signed)
Patient calling in to get her dx code, for the purpose of getting new insurance. Please advise

## 2023-03-31 NOTE — Telephone Encounter (Signed)
TC

## 2023-04-21 ENCOUNTER — Other Ambulatory Visit: Payer: Self-pay | Admitting: Gastroenterology

## 2023-05-28 ENCOUNTER — Other Ambulatory Visit: Payer: Self-pay | Admitting: Gastroenterology

## 2023-06-22 ENCOUNTER — Telehealth: Payer: Self-pay | Admitting: Gastroenterology

## 2023-06-22 MED ORDER — MESALAMINE ER 0.375 G PO CP24: ORAL_CAPSULE | ORAL | 1 refills | Status: DC

## 2023-06-22 NOTE — Telephone Encounter (Signed)
 Prescription sent to patient's pharmacy until scheduled appt.

## 2023-06-22 NOTE — Telephone Encounter (Signed)
 Patient is requesting refill for mesalamine. This is a Dr. Russella Dar patient. Dr. Leone Payor, you are DOD, please advise if I can refill under you? Patient does have an appt scheduled already to see one our APP's.

## 2023-06-22 NOTE — Telephone Encounter (Signed)
 Okay to refill mesalamine until she can get into see one of our apps

## 2023-06-22 NOTE — Telephone Encounter (Signed)
Patient called requesting a refill on Mesalamine.

## 2023-06-26 ENCOUNTER — Telehealth: Payer: Self-pay | Admitting: *Deleted

## 2023-06-26 ENCOUNTER — Ambulatory Visit: Payer: Medicare Other | Admitting: Radiation Oncology

## 2023-06-26 NOTE — Telephone Encounter (Signed)
 RETURNED PATIENT'S PHONE CALL, SPOKE WITH PATIENT AND SHE AGREED TO COME ON  07-17-23, APPT. HAS BEEN BOOKED

## 2023-07-12 ENCOUNTER — Ambulatory Visit: Payer: Medicare Other | Admitting: Gastroenterology

## 2023-07-12 ENCOUNTER — Encounter: Payer: Self-pay | Admitting: Gastroenterology

## 2023-07-12 ENCOUNTER — Other Ambulatory Visit (INDEPENDENT_AMBULATORY_CARE_PROVIDER_SITE_OTHER): Payer: Medicare Other

## 2023-07-12 VITALS — BP 130/60 | HR 85 | Ht 66.0 in | Wt 165.5 lb

## 2023-07-12 DIAGNOSIS — K5150 Left sided colitis without complications: Secondary | ICD-10-CM | POA: Diagnosis not present

## 2023-07-12 LAB — CBC WITH DIFFERENTIAL/PLATELET
Basophils Absolute: 0 10*3/uL (ref 0.0–0.1)
Basophils Relative: 0.4 % (ref 0.0–3.0)
Eosinophils Absolute: 0.1 10*3/uL (ref 0.0–0.7)
Eosinophils Relative: 0.9 % (ref 0.0–5.0)
HCT: 33.8 % — ABNORMAL LOW (ref 36.0–46.0)
Hemoglobin: 11.4 g/dL — ABNORMAL LOW (ref 12.0–15.0)
Lymphocytes Relative: 34.2 % (ref 12.0–46.0)
Lymphs Abs: 2 10*3/uL (ref 0.7–4.0)
MCHC: 33.7 g/dL (ref 30.0–36.0)
MCV: 92.1 fL (ref 78.0–100.0)
Monocytes Absolute: 0.3 10*3/uL (ref 0.1–1.0)
Monocytes Relative: 4.6 % (ref 3.0–12.0)
Neutro Abs: 3.5 10*3/uL (ref 1.4–7.7)
Neutrophils Relative %: 59.9 % (ref 43.0–77.0)
Platelets: 257 10*3/uL (ref 150.0–400.0)
RBC: 3.67 Mil/uL — ABNORMAL LOW (ref 3.87–5.11)
RDW: 15.4 % (ref 11.5–15.5)
WBC: 5.8 10*3/uL (ref 4.0–10.5)

## 2023-07-12 LAB — COMPREHENSIVE METABOLIC PANEL
ALT: 10 U/L (ref 0–35)
AST: 17 U/L (ref 0–37)
Albumin: 4.2 g/dL (ref 3.5–5.2)
Alkaline Phosphatase: 65 U/L (ref 39–117)
BUN: 12 mg/dL (ref 6–23)
CO2: 29 meq/L (ref 19–32)
Calcium: 9.3 mg/dL (ref 8.4–10.5)
Chloride: 102 meq/L (ref 96–112)
Creatinine, Ser: 1.19 mg/dL (ref 0.40–1.20)
GFR: 44.25 mL/min — ABNORMAL LOW (ref >60.00)
Glucose, Bld: 182 mg/dL — ABNORMAL HIGH (ref 70–99)
Potassium: 3.5 meq/L (ref 3.5–5.1)
Sodium: 140 meq/L (ref 135–145)
Total Bilirubin: 0.6 mg/dL (ref 0.2–1.2)
Total Protein: 6.7 g/dL (ref 6.0–8.3)

## 2023-07-12 LAB — SEDIMENTATION RATE: Sed Rate: 24 mm/h (ref 0–30)

## 2023-07-12 LAB — C-REACTIVE PROTEIN: CRP: <1 mg/dL (ref 0.5–20.0)

## 2023-07-12 MED ORDER — APRISO 0.375 G PO CP24: 375.0000 mg | ORAL_CAPSULE | Freq: Every day | ORAL | 4 refills | Status: DC

## 2023-07-12 NOTE — Patient Instructions (Signed)
Follow-up 1 year  Your provider has requested that you go to the basement level for lab work before leaving today. Press "B" on the elevator. The lab is located at the first door on the left as you exit the elevator.  We have sent the following medications to your pharmacy for you to pick up at your convenience:  Apriso 1.5g Daily  _______________________________________________________  If your blood pressure at your visit was 140/90 or greater, please contact your primary care physician to follow up on this.  _______________________________________________________  If you are age 1 or older, your body mass index should be between 23-30. Your Body mass index is 26.71 kg/m. If this is out of the aforementioned range listed, please consider follow up with your Primary Care Provider.  If you are age 69 or younger, your body mass index should be between 19-25. Your Body mass index is 26.71 kg/m. If this is out of the aformentioned range listed, please consider follow up with your Primary Care Provider.   ________________________________________________________  The [facility] providers would like to encourage you to use [facility] to communicate with providers for non-urgent requests or questions.  Due to long hold times on the telephone, sending your provider a message by [facility] may be a faster and more efficient way to get a response.  Please allow 48 business hours for a response.  Please remember that this is for non-urgent requests.  _______________________________________________________  Thank you for trusting me with your gastrointestinal care!   Boone Master, PA

## 2023-07-12 NOTE — Progress Notes (Signed)
Chief Complaint: Medication refill Primary GI MD: Dr. Russella Dar  HPI: 78 year old female history of CAD s/p CABG (on Plavix) ulcerative colitis presents for medication refill.  Last seen July 2023 by Dr. Russella Dar.  Patient has history of left-sided ulcerative colitis well-controlled on Apriso/mesalamine 1.5G daily.  Patient states she is doing well today.  Continues to have adequate control on her mesalamine 1.5G daily.  She got a letter in the mail from her insurance stating they will only approve Apriso and not mesalamine and she would like that sent in on day 90-day regimen.  No complaints today.  Doing well overall.   PREVIOUS GI WORKUP   Colonoscopy 04/18/2022 - Preparation of the colon was fair.  - One 6 mm polyp in the transverse colon, removed with a cold snare. Resected and retrieved.  - Internal hemorrhoids.  - The examination was otherwise normal on direct views. Biopsied. - Repeat in 3 years versus no repeat due to age  Diagnosis 1. Surgical [P], right colon bx - COLONIC MUCOSA WITHOUT IDENTIFIED ACTIVE COLITIS OR PATHOLOGIC INTRAEPITHELIAL LYMPHOCYTOSIS. - NO CRYPT ARCHITECTURAL DISTORTION, DYSPLASIA OR MALIGNANCY IDENTIFIED. 2. Surgical [P], left colon bx - COLONIC MUCOSA WITHOUT IDENTIFIED ACTIVE COLITIS OR PATHOLOGIC INTRAEPITHELIAL LYMPHOCYTOSIS. - NO CRYPT ARCHITECTURAL DISTORTION, DYSPLASIA OR MALIGNANCY IDENTIFIED. 3. Surgical [P], colon, transverse, polyp (1) - TUBULAR ADENOMA, NEGATIVE FOR HIGH-GRADE DYSPLASIA.  Past Medical History:  Diagnosis Date   Adenomatous colon polyp 11/2005   Anginal pain (HCC)    last time 2016   Anxiety    Arthritis    CAD (coronary artery disease)    2V CABG 1996 (LIMA to LAD, SVG to RCA) Dr. Tyrone Sage.; last stress test in 2010   Diabetes mellitus without complication (HCC)    type II   GERD (gastroesophageal reflux disease)    History of radiation therapy    Uterus- [facility] brachytherapy - Dr. Antony Blackbird 04/16/20-05/11/20    Hyperlipidemia    Hypertension    Muscle cramps    and pains   Myocardial infarction (HCC)    1996   Pneumonia    hx of x 2    Ulcerative colitis    Uterine cancer (HCC)     Past Surgical History:  Procedure Laterality Date   BACK SURGERY     lumbar fusion 25 years ago and ruptured disk 10 years ago same area   BACK SURGERY     ruptered disc under lumbar disc   CARDIAC CATHETERIZATION N/A 10/13/2014   Procedure: Left Heart Cath And Coronary Angiography;  Surgeon: Kathleene Hazel, MD;  Location: [facility];  Service: Cardiovascular;  Laterality: N/A;   CARDIAC CATHETERIZATION N/A 10/13/2014   Procedure: Coronary Stent Intervention;  Surgeon: Kathleene Hazel, MD;  Location: [facility];  Service: Cardiovascular;  Laterality: N/A;   COLONOSCOPY     CORONARY ANGIOPLASTY WITH STENT PLACEMENT  10/13/2014   DES to MID CIRCUMFLEX   CORONARY ARTERY BYPASS GRAFT  1996   x 2   DILATATION & CURETTAGE/HYSTEROSCOPY WITH MYOSURE N/A 01/03/2020   Procedure: DILATATION & CURETTAGE/HYSTEROSCOPY WITH POSSIBLE MYOSURE;  Surgeon: Huel Cote, MD;  Location: [facility] OR;  Service: Gynecology;  Laterality: N/A;   DILATION AND EVACUATION N/A 01/03/2020   Procedure: DILATATION AND EVACUATION;  Surgeon: Huel Cote, MD;  Location: [facility] OR;  Service: Gynecology;  Laterality: N/A;   IR IMAGING GUIDED PORT INSERTION  03/06/2020   IR REMOVAL TUN ACCESS W/ PORT W/O FL MOD SED  08/24/2020   OPERATIVE ULTRASOUND N/A 01/03/2020   Procedure: OPERATIVE ULTRASOUND;  Surgeon: Huel Cote, MD;  Location: [facility] OR;  Service: Gynecology;  Laterality: N/A;   POLYPECTOMY     ROBOTIC ASSISTED TOTAL HYSTERECTOMY WITH BILATERAL SALPINGO OOPHERECTOMY Bilateral 02/04/2020   Procedure: XI ROBOTIC ASSISTED TOTAL HYSTERECTOMY WITH BILATERAL SALPINGO OOPHORECTOMY, LYMPH NODE DISSECTION;  Surgeon: Carver Fila, MD;  Location: [facility];  Service: Gynecology;  Laterality: Bilateral;    SENTINEL NODE BIOPSY N/A 02/04/2020   Procedure: SENTINEL NODE BIOPSY;  Surgeon: Carver Fila, MD;  Location: [facility];  Service: Gynecology;  Laterality: N/A;   UPPER GASTROINTESTINAL ENDOSCOPY      Current Outpatient Medications  Medication Sig Dispense Refill   APRISO 0.375 g 24 hr capsule Take 1 capsule (0.375 g total) by mouth daily. 90 capsule 4   calcium carbonate (OS-CAL) 600 MG TABS tablet Take 600 mg by mouth daily with breakfast.     Cholecalciferol (VITAMIN D) 50 MCG (2000 UT) tablet Take 2,000 Units by mouth daily.     clopidogrel (PLAVIX) 75 MG tablet Take 1 tablet (75 mg total) by mouth daily. 90 tablet 1   docusate sodium (COLACE) 100 MG capsule Take 100 mg by mouth daily as needed for mild constipation.     FLUoxetine (PROZAC) 10 MG capsule Take 10 mg by mouth daily.      LORazepam (ATIVAN) 1 MG tablet Take 1 tablet (1 mg total) by mouth every 8 (eight) hours as needed for anxiety (for anxiety). 60 tablet 0   losartan-hydrochlorothiazide (HYZAAR) 50-12.5 MG tablet Take 1 tablet by mouth daily.     magnesium oxide (MAG-OX) 400 (240 Mg) MG tablet Take 400 mg by mouth daily.     Multiple Vitamins-Minerals (MULTIVITAMIN WITH MINERALS) tablet Take 1 tablet by mouth daily.     nitroGLYCERIN (NITROSTAT) 0.4 MG SL tablet Place 1 tablet (0.4 mg total) under the tongue every 5 (five) minutes as needed for chest pain. 25 tablet 3   pantoprazole (PROTONIX) 40 MG tablet Take 1 tablet (40 mg total) by mouth daily. 90 tablet 1   rosuvastatin (CRESTOR) 20 MG tablet Take 1 tablet (20 mg total) by mouth daily. 90 tablet 3   cyclobenzaprine (FLEXERIL) 5 MG tablet Take 5 mg by mouth 3 (three) times daily as needed for muscle spasms. (Patient not taking: Reported on 07/12/2023)     nystatin (MYCOSTATIN/NYSTOP) powder Apply 1 Application topically 3 (three) times daily. (Patient not taking: Reported on 07/12/2023) 15 g 1   No current facility-administered medications for this visit.     Allergies as of 07/12/2023   (No Known Allergies)    Family History  Problem Relation Age of Onset   Diabetes Mother    Heart disease Mother    Heart attack Mother 85   Hypertension Mother    Breast cancer Sister    Heart disease Sister    Hypertension Sister    Stroke Sister    Lung cancer Brother    Colon cancer Brother    Kidney disease Brother        renal cancer   Colon cancer Daughter        Thinks her daughter had negative genetic testing   Breast cancer Daughter    Pancreatitis Other    Stomach cancer Neg Hx     Social History   Socioeconomic History   Marital status: Widowed    Spouse name: Ray   Number of children: 7   Years  of education: 12   Highest education level: Not on file  Occupational History   Occupation: retired  Tobacco Use   Smoking status: Former    Current packs/day: 0.00    Types: Cigarettes    Start date: 10/04/1974    Quit date: 10/04/1994    Years since quitting: 28.7   Smokeless tobacco: Never  Vaping Use   Vaping status: Never Used  Substance and Sexual Activity   Alcohol use: No   Drug use: No   Sexual activity: Not Currently    Partners: Male  Other Topics Concern   Not on file  Social History Narrative   Not on file   Social Drivers of Health   Financial Resource Strain: Not on file  Food Insecurity: Not on file  Transportation Needs: Not on file  Physical Activity: Not on file  Stress: Not on file  Social Connections: Not on file  Intimate Partner Violence: Not on file    Review of Systems:    Constitutional: No weight loss, fever, chills, weakness or fatigue HEENT: Eyes: No change in vision               Ears, Nose, Throat:  No change in hearing or congestion Skin: No rash or itching Cardiovascular: No chest pain, chest pressure or palpitations   Respiratory: No SOB or cough Gastrointestinal: See HPI and otherwise negative Genitourinary: No dysuria or change in urinary frequency Neurological: No  headache, dizziness or syncope Musculoskeletal: No new muscle or joint pain Hematologic: No bleeding or bruising Psychiatric: No history of depression or anxiety    Physical Exam:  Vital signs: BP 130/60 (BP Location: Left Arm, Patient Position: Sitting, Cuff Size: Normal)   Pulse 85   Ht 5\' 6"  (1.676 m)   Wt 165 lb 8 oz (75.1 kg)   BMI 26.71 kg/m   Constitutional: NAD, Well developed, Well nourished, alert and cooperative Head:  Normocephalic and atraumatic. Eyes:   PEERL, EOMI. No icterus. Conjunctiva pink. Respiratory: Respirations even and unlabored. Lungs clear to auscultation bilaterally.   No wheezes, crackles, or rhonchi.  Cardiovascular:  Regular rate and rhythm. No peripheral edema, cyanosis or pallor.  Gastrointestinal:  Soft, nondistended, nontender. No rebound or guarding. Normal bowel sounds. No appreciable masses or hepatomegaly. Rectal:  Not performed.  Msk:  Symmetrical without gross deformities. Without edema, no deformity or joint abnormality.  Neurologic:  Alert and  oriented x4;  grossly normal neurologically.  Skin:   Dry and intact without significant lesions or rashes. Psychiatric: Oriented to person, place and time. Demonstrates good judgement and reason without abnormal affect or behaviors.   RELEVANT LABS AND IMAGING: CBC    Component Value Date/Time   WBC 6.5 02/11/2022 0050   RBC 3.59 (L) 02/11/2022 0050   HGB 11.5 (L) 02/11/2022 0050   HGB 10.2 (L) 08/10/2020 0917   HCT 34.1 (L) 02/11/2022 0050   PLT 203 02/11/2022 0050   PLT 134 (L) 08/10/2020 0917   MCV 95.0 02/11/2022 0050   MCH 32.0 02/11/2022 0050   MCHC 33.7 02/11/2022 0050   RDW 14.6 02/11/2022 0050   LYMPHSABS 2.1 02/11/2022 0050   MONOABS 0.3 02/11/2022 0050   EOSABS 0.0 02/11/2022 0050   BASOSABS 0.0 02/11/2022 0050    CMP     Component Value Date/Time   NA 141 02/11/2022 0050   K 3.4 (L) 02/11/2022 0050   CL 106 02/11/2022 0050   CO2 27 02/11/2022 0050   GLUCOSE 113 (H)  02/11/2022  0050   BUN 12 02/11/2022 0050   CREATININE 1.20 (H) 01/09/2023 1357   CREATININE 0.78 08/10/2020 0945   CREATININE 0.84 03/24/2015 0938   CALCIUM 9.1 02/11/2022 0050   PROT 6.6 07/12/2021 1548   ALBUMIN 4.3 07/12/2021 1548   AST 15 07/12/2021 1548   AST 18 08/10/2020 0945   ALT 12 07/12/2021 1548   ALT 9 08/10/2020 0945   ALKPHOS 75 07/12/2021 1548   BILITOT 0.4 07/12/2021 1548   BILITOT 0.7 08/10/2020 0945   GFRNONAA >60 02/11/2022 0050   GFRNONAA >60 08/10/2020 0945   GFRAA >60 03/11/2020 1509     Assessment/Plan:   Left-sided ulcerative colitis Last colonoscopy in 2023 with fair prep and small tubular adenoma with repeat recommended 3 years versus no repeat due to age.  Currently in remission on mesalamine 1.5G daily.  Insurance wants her to switch to [employer].  No complaints today. - Apriso 1.5G daily 90 days refill x 4 - CBC, CMP, CRP, ESR - QuantiFERON-TB gold - Follow-up 1 year (or sooner if symptoms develop)  Patient assigned to Dr. Lavon Paganini today  Boone Master, PA-C [facility] 07/12/2023, 3:10 PM  Cc: Cindra Presume, MD

## 2023-07-15 LAB — QUANTIFERON-TB GOLD PLUS
Mitogen-NIL: 5.38 [IU]/mL
NIL: 0.01 [IU]/mL
QuantiFERON-TB Gold Plus: NEGATIVE
TB1-NIL: 0 [IU]/mL
TB2-NIL: 0 [IU]/mL

## 2023-07-16 NOTE — Progress Notes (Signed)
Radiation Oncology         [phone] ________________________________  Name: [name] [name] MRN: [id number]  Date: 07/17/2023  DOB: 06-14-1945  Follow-Up Visit Note  CC: Cindra Presume, MD  Merri Brunette, MD  No diagnosis found.  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 She was last seen here for follow-up on 08/08/22.   During her most recent visit with Dr. Pricilla Holm on 12/23/22, she denied any symptoms concerning for disease recurrence and she was noted as NED on examination. She did report noticing some redness along her right groin and that she possibly felt a small bump in that same area. Exam findings were reflective of a possible candida infection and she was accordingly prescribed nystatin. Dr. Pricilla Holm also noted that she was unable to palpate or see the vaginal apex.   Based on her high risk histology and partially incomplete exam findings, a CT AP with contrast was performed on 01/09/23 which showed no evidence of recurrent or metastatic disease. (The known left kidney cyst was demonstrated and appeared stable).     No other significant oncologic interval history since the patient was last seen for follow-up.   ***                             Allergies:  has no known allergies.  Meds: Current Outpatient Medications  Medication Sig Dispense Refill   APRISO 0.375 g 24 hr capsule Take 1 capsule (0.375 g total) by mouth  daily. 90 capsule 4   calcium carbonate (OS-CAL) 600 MG TABS tablet Take 600 mg by mouth daily with breakfast.     Cholecalciferol (VITAMIN D) 50 MCG (2000 UT) tablet Take 2,000 Units by mouth daily.     clopidogrel (PLAVIX) 75 MG tablet Take 1 tablet (75 mg total) by mouth daily. 90 tablet 1   cyclobenzaprine (FLEXERIL) 5 MG tablet Take 5 mg by mouth 3 (three) times daily as needed for muscle spasms. (Patient not taking: Reported on 07/12/2023)     docusate sodium (COLACE) 100 MG capsule Take 100 mg by mouth daily as needed for mild constipation.     FLUoxetine (PROZAC) 10 MG capsule Take 10 mg by mouth daily.      LORazepam (ATIVAN) 1 MG tablet Take 1 tablet (1 mg total) by mouth every 8 (eight) hours as needed for anxiety (for anxiety). 60 tablet 0   losartan-hydrochlorothiazide (HYZAAR) 50-12.5 MG tablet Take 1 tablet by mouth daily.     magnesium oxide (MAG-OX) 400 (240 Mg) MG tablet Take 400 mg by mouth daily.     Multiple Vitamins-Minerals (MULTIVITAMIN WITH MINERALS) tablet Take 1 tablet by mouth daily.     nitroGLYCERIN (NITROSTAT) 0.4 MG SL tablet Place 1 tablet (0.4 mg total) under the tongue every 5 (five) minutes as needed for chest pain. 25 tablet 3   nystatin (MYCOSTATIN/NYSTOP) powder Apply 1 Application topically 3 (three) times  daily. (Patient not taking: Reported on 07/12/2023) 15 g 1   pantoprazole (PROTONIX) 40 MG tablet Take 1 tablet (40 mg total) by mouth daily. 90 tablet 1   rosuvastatin (CRESTOR) 20 MG tablet Take 1 tablet (20 mg total) by mouth daily. 90 tablet 3   No current facility-administered medications for this encounter.    Physical Findings: The patient is in no acute distress. Patient is alert and oriented.  vitals were not taken for this visit. .  No significant changes. Lungs are clear to auscultation bilaterally. Heart has regular rate and rhythm. No palpable cervical, supraclavicular, or axillary adenopathy. Abdomen soft, non-tender, normal bowel  sounds.  On pelvic examination the external genitalia were unremarkable. A speculum exam was performed. There are no mucosal lesions noted in the vaginal vault. A Pap smear was obtained of the proximal vagina. On bimanual and rectovaginal examination there were no pelvic masses appreciated. ***    Lab Findings: Lab Results  Component Value Date   WBC 5.8 07/12/2023   HGB 11.4 (L) 07/12/2023   HCT 33.8 (L) 07/12/2023   MCV 92.1 07/12/2023   PLT 257.0 07/12/2023    Radiographic Findings: No results found.  Impression: Early stage carcinosarcoma   The patient is recovering from the effects of radiation.  ***  Plan:  ***   *** minutes of total time was spent for this patient encounter, including preparation, face-to-face counseling with the patient and coordination of care, physical exam, and documentation of the encounter. ____________________________________  Billie Lade, PhD, MD  This document serves as a record of services personally performed by Antony Blackbird, MD. It was created on his behalf by Neena Rhymes, a trained medical scribe. The creation of this record is based on the scribe's personal observations and the provider's statements to them. This document has been checked and approved by the attending provider.

## 2023-07-17 ENCOUNTER — Ambulatory Visit
Admission: RE | Admit: 2023-07-17 | Discharge: 2023-07-17 | Disposition: A | Discharge status: Home / Self Care | Payer: Medicare Other | Source: Ambulatory Visit | Attending: Radiation Oncology | Admitting: Radiation Oncology

## 2023-07-17 VITALS — BP 141/77 | HR 78 | Temp 97.3°F | Resp 18 | Ht 66.0 in | Wt 165.5 lb

## 2023-07-17 DIAGNOSIS — Z79899 Other long term (current) drug therapy: Secondary | ICD-10-CM | POA: Diagnosis not present

## 2023-07-17 DIAGNOSIS — Z7902 Long term (current) use of antithrombotics/antiplatelets: Secondary | ICD-10-CM | POA: Diagnosis not present

## 2023-07-17 DIAGNOSIS — Z8542 Personal history of malignant neoplasm of other parts of uterus: Secondary | ICD-10-CM | POA: Insufficient documentation

## 2023-07-17 DIAGNOSIS — C55 Malignant neoplasm of uterus, part unspecified: Secondary | ICD-10-CM

## 2023-07-17 NOTE — Progress Notes (Incomplete)
[name] [name] is here today for follow up post radiation to the pelvic.  They completed their radiation on: 05/11/20   Does the patient complain of any of the following:  Pain: No Abdominal bloating:  Yes, remains at baseline.  Diarrhea/Constipation: No Nausea/Vomiting: No Vaginal Discharge: No Blood in Urine or Stool: No Urinary Issues (dysuria/incomplete emptying/ incontinence/ increased frequency/urgency): No Does patient report using vaginal dilator 2-3 times a week and/or sexually active 2-3 weeks: No Post radiation skin changes: No   Additional comments if applicable:  BP (!) 141/77 (BP Location: Left Arm, Patient Position: Sitting)   Pulse 78   Temp (!) 97.3 F (36.3 C) (Temporal)   Resp 18   Ht 5\' 6"  (1.676 m)   Wt 165 lb 8 oz (75.1 kg)   SpO2 99%   BMI 26.71 kg/m

## 2023-07-17 NOTE — Progress Notes (Signed)
 Negative TB Gold.

## 2023-07-18 ENCOUNTER — Telehealth: Payer: Self-pay | Admitting: Radiation Oncology

## 2023-07-18 ENCOUNTER — Telehealth: Payer: Self-pay | Admitting: Gynecologic Oncology

## 2023-07-18 ENCOUNTER — Telehealth: Payer: Self-pay | Admitting: *Deleted

## 2023-07-18 NOTE — Telephone Encounter (Signed)
Spoke to pt about upcoming appt with Dr. Pricilla Holm 12/29/23. Pt verbalized understanding.

## 2023-07-18 NOTE — Telephone Encounter (Signed)
 Thanks

## 2023-07-18 NOTE — Telephone Encounter (Signed)
Inbasket message sent to Dr. Winferd Humphrey team to schedule 57mo f/u with pt at request of Dr. Roselind Messier.

## 2023-07-18 NOTE — Telephone Encounter (Signed)
Per in basket message from Dr Trina Ao scheduler patient scheduled for a 6 month f/u on 7/18 at 3:45 pm

## 2023-07-18 NOTE — Telephone Encounter (Signed)
Spoke to pt to advise of 24yr f/u with Dr. Roselind Messier 07/15/24. Pt verbalized understanding.

## 2023-08-05 ENCOUNTER — Other Ambulatory Visit: Payer: Self-pay | Admitting: Internal Medicine

## 2023-08-05 DIAGNOSIS — K5150 Left sided colitis without complications: Secondary | ICD-10-CM

## 2023-08-05 MED ORDER — APRISO 0.375 G PO CP24: 1500.0000 mg | ORAL_CAPSULE | Freq: Every day | ORAL | 3 refills | Status: DC

## 2023-08-05 NOTE — Progress Notes (Signed)
 Patient states that she was sent a prescription for 0.375 g of Apriso to take daily.  Previously she was on 1.5 g of Apriso daily (2 capsules in the morning and 2 capsules at night).  I told her that the correct dose that should be the 1.5 g of Apriso daily.  Will send her a new prescription to reflect this.

## 2023-08-07 ENCOUNTER — Telehealth: Payer: Self-pay | Admitting: Internal Medicine

## 2023-08-07 ENCOUNTER — Ambulatory Visit: Payer: Self-pay | Admitting: Radiation Oncology

## 2023-08-07 NOTE — Telephone Encounter (Signed)
 Patient stated that she was prescribed APRISO .375 g and she is having to pay for more for 2 separate one. Patient is stated if she can change the amount of pills to the correct exact 130 pills. Patient is requesting a call back. Please advise.

## 2023-08-08 ENCOUNTER — Other Ambulatory Visit: Payer: Self-pay

## 2023-08-08 DIAGNOSIS — K5150 Left sided colitis without complications: Secondary | ICD-10-CM

## 2023-08-08 MED ORDER — APRISO 0.375 G PO CP24: 1500.0000 mg | ORAL_CAPSULE | Freq: Every day | ORAL | 3 refills | Status: DC

## 2023-08-30 ENCOUNTER — Telehealth: Payer: Self-pay | Admitting: Cardiovascular Disease

## 2023-08-30 NOTE — Telephone Encounter (Signed)
  Per MyChart scheduling message:   Pt c/o Syncope: STAT if syncope occurred within 24 hours and pt complains of lightheadedness  Did you pass out today?    When is the last time you passed out?    Has this occurred multiple times?    Did you have any symptoms prior to passing out?    5. Did you fall? If so, are you on a blood thinner?    1. No 2.Last week only once only seconds  3. Yes 4.I am so dizzy , can't think when this happens 5. Yes on Plavix 6. Yes I fell but didn't break anything

## 2023-08-30 NOTE — Telephone Encounter (Signed)
 Pt called to report that she had a syncopal episode last week lasting only a few seconds.... she got up from sitting and went to let the dogs in and she was hurrying and she got dizzy and ended up on the floor... she did not hit her head nor injure herself as far as she knows. She denied having a headache before or after, no chest pain, and she had eaten prior since it was 2 pm in the afternoon according to her. No weakness or slurring of her words.  Pt does not know what her BP is and says that she had it checked recently and it was "good".   She will be sure to hydrate well... her last OV 10/2022 and next due 10/5023.   She will also call her PCP and I will send to Dr Clifton James for review.

## 2023-08-31 NOTE — Telephone Encounter (Signed)
 Patient reports that she would prefer to see Dr. Clifton James sooner than scheduled in May if possible.    She is very fatigued and is also living with her daughter, her husband and their 2 daughters so feels it could be stress related but she is occas getting chest pain.  She is doing regular housework but nothing really exertional.  No SOB which was her symptom along with the fatigue in the past.  I scheduled her for 09/20/23 but added her to wait list as well.   Continues Plavix.  She is grateful for assistance.

## 2023-09-20 ENCOUNTER — Ambulatory Visit: Attending: Cardiovascular Disease | Admitting: Cardiovascular Disease

## 2023-09-20 ENCOUNTER — Encounter: Payer: Self-pay | Admitting: Cardiovascular Disease

## 2023-09-20 VITALS — BP 114/74 | HR 95 | Ht 66.0 in | Wt 162.4 lb

## 2023-09-20 DIAGNOSIS — E7800 Pure hypercholesterolemia, unspecified: Secondary | ICD-10-CM | POA: Diagnosis not present

## 2023-09-20 DIAGNOSIS — I2510 Atherosclerotic heart disease of native coronary artery without angina pectoris: Secondary | ICD-10-CM

## 2023-09-20 DIAGNOSIS — I10 Essential (primary) hypertension: Secondary | ICD-10-CM

## 2023-09-20 MED ORDER — ROSUVASTATIN CALCIUM 20 MG PO TABS: 20.0000 mg | ORAL_TABLET | Freq: Every day | ORAL | 3 refills | Status: AC

## 2023-09-20 MED ORDER — CLOPIDOGREL BISULFATE 75 MG PO TABS: 75.0000 mg | ORAL_TABLET | Freq: Every day | ORAL | 3 refills | Status: DC

## 2023-09-20 NOTE — Progress Notes (Signed)
 Chief Complaint  Patient presents with   Follow-up    CAD   History of Present Illness: 78 yo female with h/o CAD with acute anterior MI April 1996 with angioplasty, CABG June 1996 (LIMA to LAD, SVG to RCA), stenting Circumflex 2016, HLD, HTN, ulcerative colitis here today for cardiology followup. Last cardiac cath in May 2016 and she was found to have a severe stenosis in the mid Circumflex treated with a drug eluting stent. The LIMA was patent to the LAD and the SVG was patent to the PDA. Echo January 2019 with LVEF=60-65%, grade 2 diastolic dysfunction. She had uterine cancer and she has completed chemotherapy and radiation therapy in January 2022. She had a total hysterectomy. Echo September 2023 with LVEF=55-60%. No significant valve disease. Nuclear stress test September 2023 with no ischemia. Coreg stopped in 2023 secondary to dizziness.   She called in reporting a syncopal episode in March 2025. She stood up quickly to let the dogs in and she felt dizzy, then passed out for a few seconds. She is here today for follow up. She denies any chest pain, dyspnea, palpitations, lower extremity edema, orthopnea, PND, dizziness, near syncope or syncope.    Primary Care Physician: Cindra Presume, MD  Past Medical History:  Diagnosis Date   Adenomatous colon polyp 11/2005   Anginal pain (HCC)    last time 2016   Anxiety    Arthritis    CAD (coronary artery disease)    2V CABG 1996 (LIMA to LAD, SVG to RCA) Dr. Tyrone Sage.; last stress test in 2010   Diabetes mellitus without complication (HCC)    type II   GERD (gastroesophageal reflux disease)    History of radiation therapy    Uterus- [facility] brachytherapy - Dr. Antony Blackbird 04/16/20-05/11/20   Hyperlipidemia    Hypertension    Muscle cramps    and pains   Myocardial infarction (HCC)    1996   Pneumonia    hx of x 2    Ulcerative colitis    Uterine cancer (HCC)     Past Surgical History:  Procedure Laterality Date   BACK  SURGERY     lumbar fusion 25 years ago and ruptured disk 10 years ago same area   BACK SURGERY     ruptered disc under lumbar disc   CARDIAC CATHETERIZATION N/A 10/13/2014   Procedure: Left Heart Cath And Coronary Angiography;  Surgeon: Kathleene Hazel, MD;  Location: [facility];  Service: Cardiovascular;  Laterality: N/A;   CARDIAC CATHETERIZATION N/A 10/13/2014   Procedure: Coronary Stent Intervention;  Surgeon: Kathleene Hazel, MD;  Location: [facility];  Service: Cardiovascular;  Laterality: N/A;   COLONOSCOPY     CORONARY ANGIOPLASTY WITH STENT PLACEMENT  10/13/2014   DES to MID CIRCUMFLEX   CORONARY ARTERY BYPASS GRAFT  1996   x 2   DILATATION & CURETTAGE/HYSTEROSCOPY WITH MYOSURE N/A 01/03/2020   Procedure: DILATATION & CURETTAGE/HYSTEROSCOPY WITH POSSIBLE MYOSURE;  Surgeon: Huel Cote, MD;  Location: [facility] OR;  Service: Gynecology;  Laterality: N/A;   DILATION AND EVACUATION N/A 01/03/2020   Procedure: DILATATION AND EVACUATION;  Surgeon: Huel Cote, MD;  Location: [facility] OR;  Service: Gynecology;  Laterality: N/A;   IR IMAGING GUIDED PORT INSERTION  03/06/2020   IR REMOVAL TUN ACCESS W/ PORT W/O FL MOD SED  08/24/2020   OPERATIVE ULTRASOUND N/A 01/03/2020   Procedure: OPERATIVE ULTRASOUND;  Surgeon: Huel Cote, MD;  Location:  [facility] OR;  Service: Gynecology;  Laterality: N/A;   POLYPECTOMY     ROBOTIC ASSISTED TOTAL HYSTERECTOMY WITH BILATERAL SALPINGO OOPHERECTOMY Bilateral 02/04/2020   Procedure: XI ROBOTIC ASSISTED TOTAL HYSTERECTOMY WITH BILATERAL SALPINGO OOPHORECTOMY, LYMPH NODE DISSECTION;  Surgeon: Carver Fila, MD;  Location: [facility] ORS;  Service: Gynecology;  Laterality: Bilateral;   SENTINEL NODE BIOPSY N/A 02/04/2020   Procedure: SENTINEL NODE BIOPSY;  Surgeon: Carver Fila, MD;  Location: [facility];  Service: Gynecology;  Laterality: N/A;   UPPER GASTROINTESTINAL ENDOSCOPY      Current Outpatient Medications   Medication Sig Dispense Refill   APRISO 0.375 g 24 hr capsule Take 4 capsules (1.5 g total) by mouth daily. 120 capsule 3   calcium carbonate (OS-CAL) 600 MG TABS tablet Take 600 mg by mouth daily with breakfast. 650mg      Cholecalciferol (VITAMIN D) 50 MCG (2000 UT) tablet Take 2,000 Units by mouth daily.     cyanocobalamin (VITAMIN B12) 1000 MCG tablet Take 1,000 mcg by mouth daily.     cyclobenzaprine (FLEXERIL) 5 MG tablet Take 5 mg by mouth 3 (three) times daily as needed for muscle spasms.     docusate sodium (COLACE) 100 MG capsule Take 100 mg by mouth daily as needed for mild constipation.     FLUoxetine (PROZAC) 10 MG capsule Take 10 mg by mouth daily.      LORazepam (ATIVAN) 1 MG tablet Take 1 tablet (1 mg total) by mouth every 8 (eight) hours as needed for anxiety (for anxiety). 60 tablet 0   magnesium oxide (MAG-OX) 400 (240 Mg) MG tablet Take 400 mg by mouth daily.     Multiple Vitamins-Minerals (MULTIVITAMIN WITH MINERALS) tablet Take 1 tablet by mouth daily.     nitroGLYCERIN (NITROSTAT) 0.4 MG SL tablet Place 1 tablet (0.4 mg total) under the tongue every 5 (five) minutes as needed for chest pain. 25 tablet 3   olmesartan-hydrochlorothiazide (BENICAR HCT) 40-12.5 MG tablet Take 1 tablet by mouth daily.     pantoprazole (PROTONIX) 40 MG tablet Take 1 tablet (40 mg total) by mouth daily. 90 tablet 1   clopidogrel (PLAVIX) 75 MG tablet Take 1 tablet (75 mg total) by mouth daily. 90 tablet 3   nystatin (MYCOSTATIN/NYSTOP) powder Apply 1 Application topically 3 (three) times daily. 15 g 1   rosuvastatin (CRESTOR) 20 MG tablet Take 1 tablet (20 mg total) by mouth daily. 90 tablet 3   No current facility-administered medications for this visit.    No Known Allergies  Social History   Socioeconomic History   Marital status: Widowed    Spouse name: Ray   Number of children: 7   Years of education: 12   Highest education level: Not on file  Occupational History   Occupation:  retired  Tobacco Use   Smoking status: Former    Current packs/day: 0.00    Types: Cigarettes    Start date: 10/04/1974    Quit date: 10/04/1994    Years since quitting: 28.9   Smokeless tobacco: Never  Vaping Use   Vaping status: Never Used  Substance and Sexual Activity   Alcohol use: No   Drug use: No   Sexual activity: Not Currently    Partners: Male  Other Topics Concern   Not on file  Social History Narrative   Not on file   Social Drivers of Health   Financial Resource Strain: Not on file  Food Insecurity: Not on file  Transportation Needs: Not on  file  Physical Activity: Not on file  Stress: Not on file  Social Connections: Not on file  Intimate Partner Violence: Not on file    Family History  Problem Relation Age of Onset   Diabetes Mother    Heart disease Mother    Heart attack Mother 71   Hypertension Mother    Breast cancer Sister    Heart disease Sister    Hypertension Sister    Stroke Sister    Lung cancer Brother    Colon cancer Brother    Kidney disease Brother        renal cancer   Colon cancer Daughter        Thinks her daughter had negative genetic testing   Breast cancer Daughter    Pancreatitis Other    Stomach cancer Neg Hx     Review of Systems:  As stated in the HPI and otherwise negative.   BP 114/74   Pulse 95   Ht 5\' 6"  (1.676 m)   Wt 73.7 kg   SpO2 97%   BMI 26.21 kg/m   Physical Examination: General: Well developed, well nourished, NAD  HEENT: OP clear, mucus membranes moist  SKIN: warm, dry. No rashes. Neuro: No focal deficits  Musculoskeletal: Muscle strength 5/5 all ext  Psychiatric: Mood and affect normal  Neck: No JVD, no carotid bruits, no thyromegaly, no lymphadenopathy.  Lungs:Clear bilaterally, no wheezes, rhonci, crackles Cardiovascular: Regular rate and rhythm. No murmurs, gallops or rubs. Abdomen:Soft. Bowel sounds present. Non-tender.  Extremities: No lower extremity edema. Pulses are 2 + in the  bilateral DP/PT.  Echo 02/25/22:  1. Left ventricular ejection fraction, by estimation, is 55 to 60%. Left  ventricular ejection fraction by 3D volume is 52 %. The left ventricle has  normal function. The left ventricle has no regional wall motion  abnormalities. Left ventricular diastolic   parameters are consistent with Grade I diastolic dysfunction (impaired  relaxation).   2. Right ventricular systolic function is normal. The right ventricular  size is normal.   3. The mitral valve is normal in structure. No evidence of mitral valve  regurgitation. No evidence of mitral stenosis.   4. The aortic valve is normal in structure. There is mild thickening of  the aortic valve. Aortic valve regurgitation is not visualized. Aortic  valve sclerosis is present, with no evidence of aortic valve stenosis.   5. The inferior vena cava is normal in size with greater than 50%  respiratory variability, suggesting right atrial pressure of 3 mmHg.   Cardiac cath Oct 13, 2014: Left main: Diffuse 20% stenosis.   Left Anterior Descending Artery: Large caliber vessel that courses to the apex. The proximal vessel is occluded. The mid and distal vessel fills from the patent graft.   Circumflex Artery: Moderate caliber vessel with moderate caliber obtuse marginal branch. The mid vessel has a 99% stenosis.   Right Coronary Artery: Large dominant vessel with 100% mid occlusion. The distal vessel fills from the patent vein graft.  Graft Anatomy:  SVG to PDA is patent LIMA to mid LAD is patent Left Ventricular Angiogram: LVEF=60-65%.   EKG:  EKG is ordered today. The ekg ordered today demonstrates   EKG Interpretation Date/Time:  Wednesday September 20 2023 15:01:37 EDT Ventricular Rate:  88 PR Interval:  168 QRS Duration:  74 QT Interval:  364 QTC Calculation: 440 R Axis:   62  Text Interpretation: Normal sinus rhythm Poor R wave progression Confirmed by Verne Carrow [phone])  on 09/20/2023 3:02:51 PM    Recent Labs: 07/12/2023: ALT 10; BUN 12; Creatinine, Ser 1.19; Hemoglobin 11.4; Platelets 257.0; Potassium 3.5; Sodium 140   Lipid Panel    Component Value Date/Time   CHOL 174 07/12/2021 1548   TRIG 123 07/12/2021 1548   HDL 55 07/12/2021 1548   CHOLHDL 3.2 07/12/2021 1548   CHOLHDL 3.0 03/24/2015 0938   VLDL 12 03/24/2015 0938   LDLCALC 97 07/12/2021 1548     Wt Readings from Last 3 Encounters:  09/20/23 73.7 kg  07/17/23 75.1 kg  07/12/23 75.1 kg    Assessment and Plan:   1. CAD without angina: She is s/p 2V CABG in 1996. Last cardiac cath May 2016 at which time a drug eluting stent was placed in the mid Circumflex. LIMA graft was patent to the LAD and the SVG was patent to the PDA. Nuclear stress test in September 2023 with no ischemia. Continue Plavix and statin.        2. HTN: BP is controlled. No changes  3. Hyperlipidemia: Lipids followed in primary care. Continue statin.   Labs/ tests ordered today include:  Orders Placed This Encounter  Procedures   EKG 12-Lead   Disposition:   Follow up with me in 12 months  Signed, Verne Carrow, MD 09/20/2023 4:07 PM    [facility] [address] [place], [place], [place]  [address] Phone: [phone]; Fax: [phone]

## 2023-09-20 NOTE — Patient Instructions (Signed)
 Medication Instructions:  No changes *If you need a refill on your cardiac medications before your next appointment, please call your pharmacy*  Lab Work: none   Testing/Procedures: none  Follow-Up: At [facility], you and your health needs are our priority.  As part of our continuing mission to provide you with exceptional heart care, our providers are all part of one team.  This team includes your primary Cardiologist (physician) and Advanced Practice Providers or APPs (Physician Assistants and Nurse Practitioners) who all work together to provide you with the care you need, when you need it.  Your next appointment:   12 month(s)  Provider:   Verne Carrow, MD     We recommend signing up for the patient portal called "MyChart".  Sign up information is provided on this After Visit Summary.  MyChart is used to connect with patients for Virtual Visits (Telemedicine).  Patients are able to view lab/test results, encounter notes, upcoming appointments, etc.  Non-urgent messages can be sent to your provider as well.   To learn more about what you can do with MyChart, go to [email or web address].        1st Floor: - Lobby - Registration  - Pharmacy  - Lab - Cafe  2nd Floor: - PV Lab - Diagnostic Testing (echo, CT, nuclear med)  3rd Floor: - Vacant  4th Floor: - TCTS (cardiothoracic surgery) - AFib Clinic - Structural Heart Clinic - Vascular Surgery  - Vascular Ultrasound  5th Floor: - HeartCare Cardiology (general and EP) - Clinical Pharmacy for coumadin, hypertension, lipid, weight-loss medications, and med management appointments    Valet parking services will be available as well.

## 2023-11-29 ENCOUNTER — Other Ambulatory Visit (HOSPITAL_COMMUNITY): Payer: Self-pay

## 2023-11-29 ENCOUNTER — Telehealth: Payer: Self-pay

## 2023-11-29 NOTE — Telephone Encounter (Signed)
 Pharmacy Patient Advocate Encounter   Received notification from CoverMyMeds that prior authorization for Mesalamine  ER 0.375GM er capsules is required/requested.   Insurance verification completed.   The patient is insured through [facility] Medicare Part D .   Per test claim: The current 30 day co-pay is, $47.00**.  No PA needed at this time. This test claim was processed through [facility]- copay amounts may vary at other pharmacies due to pharmacy/plan contracts, or as the patient moves through the different stages of their insurance plan.     **Must be run for [facility] Apriso 

## 2023-12-29 ENCOUNTER — Encounter: Payer: Self-pay | Admitting: Gynecologic Oncology

## 2023-12-29 ENCOUNTER — Inpatient Hospital Stay: Payer: Medicare Other | Attending: Gynecologic Oncology | Admitting: Gynecologic Oncology

## 2023-12-29 VITALS — BP 130/68 | HR 81 | Temp 98.1°F | Resp 19 | Wt 163.8 lb

## 2023-12-29 DIAGNOSIS — Z90722 Acquired absence of ovaries, bilateral: Secondary | ICD-10-CM | POA: Diagnosis not present

## 2023-12-29 DIAGNOSIS — Z8542 Personal history of malignant neoplasm of other parts of uterus: Secondary | ICD-10-CM | POA: Diagnosis present

## 2023-12-29 DIAGNOSIS — C55 Malignant neoplasm of uterus, part unspecified: Secondary | ICD-10-CM

## 2023-12-29 DIAGNOSIS — Z87891 Personal history of nicotine dependence: Secondary | ICD-10-CM | POA: Diagnosis not present

## 2023-12-29 DIAGNOSIS — Z90710 Acquired absence of both cervix and uterus: Secondary | ICD-10-CM | POA: Diagnosis not present

## 2023-12-29 DIAGNOSIS — Z923 Personal history of irradiation: Secondary | ICD-10-CM | POA: Insufficient documentation

## 2023-12-29 NOTE — Patient Instructions (Signed)
 It was good to see you today.  I do not see or feel any evidence of cancer recurrence on your exam.  We will see you for follow-up in 12 months.  As always, if you develop any new and concerning symptoms before your next visit, please call to see me sooner.

## 2023-12-29 NOTE — Progress Notes (Signed)
 Gynecologic Oncology Return Clinic Visit  12/29/23  Reason for Visit: surveillance  Treatment History: Oncology History Overview Note  IHC MMR intact MSI-stable Carcinosarcoma   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HCC)  01/01/2020 Imaging   CT A/P: 1. Severe distension of the endometrium highly suspicious for cervical obstruction or stenosis possibly due to a neoplastic process in this postmenopausal female with history of vaginal bleeding. Further evaluation with hysteroscopy is  recommended. 2. Diarrheal state. Correlation with clinical exam and stool cultures recommended. No bowel obstruction. Normal appendix.   01/03/2020 Surgery   D&C A. ENDOMETRIUM, CURETTAGE:  -  Poorly differentiated malignancy  -  See comment  COMMENT:  The material consists of a poorly differentiated malignancy with extensive necrosis.  By immunohistochemistry, p16 is positive and there is focal cytokeratin AE1/3 positivity but negative for cytokeratin 7, cytokeratin 5/6, CD20, CD3, p53, WT-1, Pax-8, and S100. The differential includes poorly differentiated carcinoma and high-grade stromal sarcoma.    02/04/2020 Surgery   Robotic-assisted laparoscopic total hysterectomy with bilateral salpingoophorectomy, SLN biopsy, left pelvic LND   On EUA, 10cm mobile uterus. ON intra-abdominal entry, normal upper abdominal survey. Normal appearing omentum, small and large bowel. Uterus 10cm and bulbous. Tortuous and hyperemic ovarian vasculature. Some tubal metaplasia vs. Metastatic disease. Normal appearing ovaries. Mapping successful to deep right obturator SLN and presacral SLN. NO mapping on left. Enlarge deep obturator lymph on the left. Given high grade histology, short small bowel mesentery, and recent COVID infection, decision made not to proceed with left PA LND (would have likely required open procedure. NO intra-abdominal or pelvic evidence of disease.    02/04/2020 Pathology Results   FINAL MICROSCOPIC DIAGNOSIS:   A.  SENTINEL LYMPH NODE, RIGHT OBTURATOR, EXCISION:  -  No carcinoma identified in one lymph node (0/1)  -  See comment   B. UTERUS AND BILATERAL FALLOPIAN TUBES AND OVARIES, HYSTERECTOMY AND  BILATERAL SALPINGO-OOPHORECTOMY:  -  Carcinosarcoma confined to the uterus and involving less than half of the myometrium  -  See oncology table and comment below   C. LYMPH NODES, LEFT PELVIC, REGIONAL RESECTION:  -  No carcinoma identified in three lymph nodes (0/3)   D. SENTINEL LYMPH NODE, PRESACRAL, EXCISION:  -  No carcinoma identified in one lymph node (0/1)   COMMENT:   A.  Cytokeratin AE1/3 was performed on the sentinel lymph nodes to exclude micrometastasis.  There is no evidence of metastatic carcinoma by immunohistochemistry.   B.  The tumor consists of poorly cohesive epithelioid and rhabdoid cells.  A panel of immunohistochemistry (p16, f, p53, PAX 8, SMA, CD10, CD45, CD 99, CD117, CD138, cytokeratin 7, cyclin D1, cytokeratin AE1/3, EMA and GATA3) is performed to better classify this neoplasm.  By immunohistochemistry, the neoplastic cells are positive for p16, p53 and  PAX 8.  The epithelioid cells are positive for cytokeratin AE1/3 and EMA while the rhabdoid cells show positivity for desmin. Overall, the immunophenotype and morphology supports the diagnosis of carcinosarcoma.    ONCOLOGY TABLE:   UTERUS, CARCINOMA OR CARCINOSARCOMA   Procedure: Total hysterectomy and bilateral salpingo-oophorectomy  Histologic type: Carcinosarcoma  Histologic Grade: FIGO grade 3  Myometrial invasion: Estimated to be less than 50%  Uterine Serosa Involvement: Not identified  Cervical stromal involvement: Not identified  Extent of involvement of other organs: Not identified  Lymphovascular invasion: Not identified  Regional Lymph Nodes:       Examined:       2 Sentinel  3 non-sentinel                               5 total        Lymph nodes with metastasis: 0         Isolated tumor cells (<0.2 mm): 0        Micrometastasis:  (>0.2 mm and < 2.0 mm): 0        Macrometastasis: (>2.0 mm): 0        Extracapsular extension: N/A  Representative Tumor Block: B1  MMR / MSI testing: Will be ordered  Pathologic Stage Classification (pTNM, AJCC 8th edition):  pT1a, pN0  Comments: See above    02/04/2020 Cancer Staging   Staging form: Corpus Uteri - Carcinoma and Carcinosarcoma, AJCC 8th Edition - Clinical stage from 02/04/2020: FIGO Stage IA (cT1a, cN0(sn), cM0) - Signed by Viktoria Comer SAUNDERS, MD on 02/12/2020   03/06/2020 Imaging   Placement of single lumen port a cath via right internal jugular vein. The catheter tip lies at the cavo-atrial junction. A power injectable port a cath was placed and is ready for immediate use.   03/12/2020 -  Chemotherapy   The patient had carboplatin  and taxol  for chemotherapy treatment.     04/16/2020 - 05/11/2020 Radiation Therapy   04/16/2020 through 05/11/2020 Site Technique Total Dose (Gy) Dose per Fx (Gy) Completed Fx Beam Energies  Vagina: Pelvis HDR-brachy 30/30 6 5/5 Ir-192       08/10/2020 Imaging   No evidence of recurrent or metastatic carcinoma within the abdomen or pelvis.   Aortic Atherosclerosis (ICD10-I70.0).   08/24/2020 Procedure   Successful removal of implanted Port-A-Cath.     Interval History: Overall doing well.  Denies any vaginal bleeding.  Denies any abdominal or pelvic pain.  Reports baseline bowel bladder function.  Has not been using her vaginal dilator.  Endorses some dizziness if she gets up too quickly.  Past Medical/Surgical History: Past Medical History:  Diagnosis Date   Adenomatous colon polyp 11/2005   Anginal pain (HCC)    last time 2016   Anxiety    Arthritis    CAD (coronary artery disease)    2V CABG 1996 (LIMA to LAD, SVG to RCA) Dr. Army.; last stress test in 2010   Diabetes mellitus without complication (HCC)    type II   GERD (gastroesophageal reflux disease)     History of radiation therapy    Uterus- [facility] brachytherapy - Dr. Lynwood Nasuti 04/16/20-05/11/20   Hyperlipidemia    Hypertension    Muscle cramps    and pains   Myocardial infarction (HCC)    1996   Pneumonia    hx of x 2    Ulcerative colitis    Uterine cancer (HCC)     Past Surgical History:  Procedure Laterality Date   BACK SURGERY     lumbar fusion 25 years ago and ruptured disk 10 years ago same area   BACK SURGERY     ruptered disc under lumbar disc   CARDIAC CATHETERIZATION N/A 10/13/2014   Procedure: Left Heart Cath And Coronary Angiography;  Surgeon: Lonni JONETTA Cash, MD;  Location: [facility];  Service: Cardiovascular;  Laterality: N/A;   CARDIAC CATHETERIZATION N/A 10/13/2014   Procedure: Coronary Stent Intervention;  Surgeon: Lonni JONETTA Cash, MD;  Location: [facility];  Service: Cardiovascular;  Laterality: N/A;   COLONOSCOPY  CORONARY ANGIOPLASTY WITH STENT PLACEMENT  10/13/2014   DES to MID CIRCUMFLEX   CORONARY ARTERY BYPASS GRAFT  1996   x 2   DILATATION & CURETTAGE/HYSTEROSCOPY WITH MYOSURE N/A 01/03/2020   Procedure: DILATATION & CURETTAGE/HYSTEROSCOPY WITH POSSIBLE MYOSURE;  Surgeon: Estelle Service, MD;  Location: [facility] OR;  Service: Gynecology;  Laterality: N/A;   DILATION AND EVACUATION N/A 01/03/2020   Procedure: DILATATION AND EVACUATION;  Surgeon: Estelle Service, MD;  Location: [facility] OR;  Service: Gynecology;  Laterality: N/A;   IR IMAGING GUIDED PORT INSERTION  03/06/2020   IR REMOVAL TUN ACCESS W/ PORT W/O FL MOD SED  08/24/2020   OPERATIVE ULTRASOUND N/A 01/03/2020   Procedure: OPERATIVE ULTRASOUND;  Surgeon: Estelle Service, MD;  Location: [facility] OR;  Service: Gynecology;  Laterality: N/A;   POLYPECTOMY     ROBOTIC ASSISTED TOTAL HYSTERECTOMY WITH BILATERAL SALPINGO OOPHERECTOMY Bilateral 02/04/2020   Procedure: XI ROBOTIC ASSISTED TOTAL HYSTERECTOMY WITH BILATERAL SALPINGO OOPHORECTOMY, LYMPH NODE DISSECTION;  Surgeon:  Viktoria Comer SAUNDERS, MD;  Location: [facility] ORS;  Service: Gynecology;  Laterality: Bilateral;   SENTINEL NODE BIOPSY N/A 02/04/2020   Procedure: SENTINEL NODE BIOPSY;  Surgeon: Viktoria Comer SAUNDERS, MD;  Location: [facility];  Service: Gynecology;  Laterality: N/A;   UPPER GASTROINTESTINAL ENDOSCOPY      Family History  Problem Relation Age of Onset   Diabetes Mother    Heart disease Mother    Heart attack Mother 1   Hypertension Mother    Breast cancer Sister    Heart disease Sister    Hypertension Sister    Stroke Sister    Lung cancer Brother    Colon cancer Brother    Kidney disease Brother        renal cancer   Colon cancer Daughter        Thinks her daughter had negative genetic testing   Breast cancer Daughter    Pancreatitis Other    Stomach cancer Neg Hx     Social History   Socioeconomic History   Marital status: Widowed    Spouse name: Ray   Number of children: 7   Years of education: 12   Highest education level: Not on file  Occupational History   Occupation: retired  Tobacco Use   Smoking status: Former    Current packs/day: 0.00    Types: Cigarettes    Start date: 10/04/1974    Quit date: 10/04/1994    Years since quitting: 29.2   Smokeless tobacco: Never  Vaping Use   Vaping status: Never Used  Substance and Sexual Activity   Alcohol use: No   Drug use: No   Sexual activity: Not Currently    Partners: Male  Other Topics Concern   Not on file  Social History Narrative   Not on file   Social Drivers of Health   Financial Resource Strain: Not on file  Food Insecurity: Not on file  Transportation Needs: Not on file  Physical Activity: Not on file  Stress: Not on file  Social Connections: Not on file    Current Medications:  Current Outpatient Medications:    APRISO  0.375 g 24 hr capsule, Take 4 capsules (1.5 g total) by mouth daily., Disp: 120 capsule, Rfl: 3   calcium  carbonate (OS-CAL) 600 MG TABS tablet, Take 600 mg by mouth daily with  breakfast. 650mg , Disp: , Rfl:    Cholecalciferol (VITAMIN D) 50 MCG (2000 UT) tablet, Take 2,000 Units by mouth daily., Disp: , Rfl:  clopidogrel  (PLAVIX ) 75 MG tablet, Take 1 tablet (75 mg total) by mouth daily., Disp: 90 tablet, Rfl: 3   cyanocobalamin (VITAMIN B12) 1000 MCG tablet, Take 1,000 mcg by mouth daily., Disp: , Rfl:    cyclobenzaprine (FLEXERIL) 5 MG tablet, Take 5 mg by mouth 3 (three) times daily as needed for muscle spasms., Disp: , Rfl:    docusate sodium  (COLACE) 100 MG capsule, Take 100 mg by mouth daily as needed for mild constipation., Disp: , Rfl:    FLUoxetine  (PROZAC ) 10 MG capsule, Take 10 mg by mouth daily. , Disp: , Rfl:    LORazepam  (ATIVAN ) 1 MG tablet, Take 1 tablet (1 mg total) by mouth every 8 (eight) hours as needed for anxiety (for anxiety)., Disp: 60 tablet, Rfl: 0   magnesium  oxide (MAG-OX) 400 (240 Mg) MG tablet, Take 400 mg by mouth daily., Disp: , Rfl:    Multiple Vitamins-Minerals (MULTIVITAMIN WITH MINERALS) tablet, Take 1 tablet by mouth daily., Disp: , Rfl:    nitroGLYCERIN  (NITROSTAT ) 0.4 MG SL tablet, Place 1 tablet (0.4 mg total) under the tongue every 5 (five) minutes as needed for chest pain., Disp: 25 tablet, Rfl: 3   nystatin  (MYCOSTATIN /NYSTOP ) powder, Apply 1 Application topically 3 (three) times daily., Disp: 15 g, Rfl: 1   olmesartan-hydrochlorothiazide  (BENICAR HCT) 40-12.5 MG tablet, Take 1 tablet by mouth daily., Disp: , Rfl:    pantoprazole  (PROTONIX ) 40 MG tablet, Take 1 tablet (40 mg total) by mouth daily., Disp: 90 tablet, Rfl: 1   rosuvastatin  (CRESTOR ) 20 MG tablet, Take 1 tablet (20 mg total) by mouth daily., Disp: 90 tablet, Rfl: 3  Review of Systems: Denies appetite changes, fevers, chills, fatigue, unexplained weight changes. Denies hearing loss, neck lumps or masses, mouth sores, ringing in ears or voice changes. Denies cough or wheezing.  Denies shortness of breath. Denies chest pain or palpitations. Denies leg  swelling. Denies abdominal distention, pain, blood in stools, constipation, diarrhea, nausea, vomiting, or early satiety. Denies pain with intercourse, dysuria, frequency, hematuria or incontinence. Denies hot flashes, pelvic pain, vaginal bleeding or vaginal discharge.   Denies joint pain, back pain or muscle pain/cramps. Denies itching, rash, or wounds. Denies dizziness, headaches, numbness or seizures. Denies swollen lymph nodes or glands, denies easy bruising or bleeding. Denies anxiety, depression, confusion, or decreased concentration.  Physical Exam: BP 130/68 (BP Location: Left Arm, Patient Position: Sitting)   Pulse 81   Temp 98.1 F (36.7 C) (Oral)   Resp 19   Wt 163 lb 12.8 oz (74.3 kg)   SpO2 96%   BMI 26.44 kg/m  General: Alert, oriented, no acute distress. HEENT: Normocephalic, atraumatic, sclera anicteric. Chest: Clear to auscultation bilaterally.  No wheezes or rhonchi. Cardiovascular: Regular rate and rhythm, no murmurs. Abdomen: Obese, soft, nontender.  Normoactive bowel sounds.  No masses or hepatosplenomegaly appreciated.  Well-healed incisions. Extremities: Grossly normal range of motion.  Warm, well perfused.  No edema bilaterally. Skin: No rashes or lesions noted. Lymphatics: No cervical, supraclavicular, or inguinal adenopathy.   GU: Normal appearing external genitalia without erythema, excoriation, or lesions.  Speculum exam reveals moderately atrophic vaginal mucosa, no lesions or masses, no bleeding or discharge.  There is some shortening of the vagina, now measuring approximately 4 cm.  Bimanual exam reveals cuff intact, no nodularity or masses.  On rectovaginal exam, these findings are confirmed.   Laboratory & Radiologic Studies: None new  Assessment & Plan: [name] [name] is a 78 y.o. 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 MS stable. P53  mutated. Last imaging 12/2022, no recurrent/metastatic disease.   Patient is overall doing well.  NED on exam today.  Given atrophy and radiation changes, she developed some agglutination at the top of her vagina.  This area is appreciated easily on rectovaginal exam with no nodularity or masses palpated.  We discussed signs and symptoms that would be concerning for disease recurrence, and she knows to call if she develops any of these sooner than her next scheduled visit.  Per NCCN recommendations, we will continue with visits every 6 months.  She will see Dr. Shannon in 6 months.   20 minutes of total time was spent for this patient encounter, including preparation, face-to-face counseling with the patient and coordination of care, and documentation of the encounter.  Comer Dollar, MD  Division of Gynecologic Oncology  Department of Obstetrics and Gynecology  [facility]

## 2024-01-08 ENCOUNTER — Ambulatory Visit: Admitting: Gynecologic Oncology

## 2024-03-05 ENCOUNTER — Encounter: Payer: Self-pay | Admitting: Cardiovascular Disease

## 2024-04-03 ENCOUNTER — Telehealth: Payer: Self-pay | Admitting: Cardiovascular Disease

## 2024-04-03 NOTE — Telephone Encounter (Signed)
 Spoke with patient. She saw a different cardiologist in [place]. She said she wanted to come see our care team, but MD did not have openings and we were in the process of moving at that time (?) but then notes she was seen this month by [facility] cardiologist. An echo was ordered and she told that the bottom of LV was not working, was told this was probably coming from a blockage. She would like Dr. Santa opinion.   A few days ago she had isolated CP incident. Woke up around 5am with chest pain. She took NTG x1, with relief. Caused a headache. She has no exertional anginal symptoms (see below)  She notes she has recently started walking with daughter at [facility]. She does not have chest pain or SOB. Notes tiring easily. She had been out of an exercise routine for a period of time.   Possible that echo report is in care everywhere, however nothing was loading at the time of this message.   Routed to MD/RN - may need medical records assistance

## 2024-04-03 NOTE — Telephone Encounter (Signed)
  Pt c/o of Chest Pain: STAT if active CP, including tightness, pressure, jaw pain, radiating pain to shoulder/upper arm/back, CP unrelieved by Nitro. Symptoms reported of SOB, nausea, vomiting, sweating.  1. Are you having CP right now?   No  2. Are you experiencing any other symptoms (ex. SOB, nausea, vomiting, sweating)?   Very tired, headache  3. Is your CP continuous or coming and going? Patient stated she only had the chest pain once and she took a Nitroglycerin  tablet and it went away  4. Have you taken Nitroglycerin ?   Yes  5. How long have you been experiencing CP?   Episode happened a few days ago and not since then   6. If NO CP at time of call then end call with telling Pt to call back or call 911 if Chest pain returns prior to return call from triage team.   Patient stated she recently had an ultrasound which showed the bottom of her left ventricle was not working and wants advice on next steps.

## 2024-04-03 NOTE — Telephone Encounter (Signed)
 Spoke with patient. Advised that MD suggested OV. She is not at a place where she has her calendar to schedule anything. She will call back. She will also work to obtain copy of echo

## 2024-04-03 NOTE — Telephone Encounter (Signed)
 Verlin Lonni BIRCH, MD to Me  Velinda Avelina CROME, RN (Selected Message) 04/03/24 10:39 AM I will be happy to see her or we can get her into see an office DOD or APP. I cannot see her echo. She will need to bring that with her. [place]

## 2024-04-04 NOTE — Telephone Encounter (Signed)
 I spoke with patient.  She would like to see Dr Verlin to discuss echo she had done recently in [place] .  Also wore monitor and was started on metoprolol but was unable to tolerate it.  Appointment made for patient to see Dr Verlin on 11/3 at 2:40.  Patient will bring echo and monitor reports to this appointment.

## 2024-04-04 NOTE — Telephone Encounter (Signed)
 Pt returning call about this appt. Please advise.

## 2024-04-04 NOTE — Telephone Encounter (Signed)
 See phone note.  Appointment has been scheduled

## 2024-04-15 ENCOUNTER — Ambulatory Visit: Attending: Cardiovascular Disease | Admitting: Cardiovascular Disease

## 2024-04-15 ENCOUNTER — Encounter: Payer: Self-pay | Admitting: Cardiovascular Disease

## 2024-04-15 VITALS — BP 110/54 | HR 87 | Ht 66.0 in | Wt 167.2 lb

## 2024-04-15 DIAGNOSIS — E7800 Pure hypercholesterolemia, unspecified: Secondary | ICD-10-CM | POA: Diagnosis not present

## 2024-04-15 DIAGNOSIS — I2510 Atherosclerotic heart disease of native coronary artery without angina pectoris: Secondary | ICD-10-CM

## 2024-04-15 DIAGNOSIS — I10 Essential (primary) hypertension: Secondary | ICD-10-CM

## 2024-04-15 MED ORDER — CLOPIDOGREL BISULFATE 75 MG PO TABS: 75.0000 mg | ORAL_TABLET | Freq: Every day | ORAL | 3 refills | Status: AC

## 2024-04-15 MED ORDER — OLMESARTAN MEDOXOMIL-HCTZ 40-12.5 MG PO TABS: 1.0000 | ORAL_TABLET | Freq: Every day | ORAL | 3 refills | Status: AC

## 2024-04-15 NOTE — Progress Notes (Signed)
 Chief Complaint  Patient presents with   Follow-up    CAD   History of Present Illness: 78 yo female with h/o CAD with acute anterior MI April 1996 with angioplasty, CABG June 1996 (LIMA to LAD, SVG to RCA), stenting Circumflex 2016, HLD, HTN, ulcerative colitis here today for cardiology followup. Last cardiac cath in May 2016 and she was found to have a severe stenosis in the mid Circumflex treated with a drug eluting stent. The LIMA was patent to the LAD and the SVG was patent to the PDA. Echo January 2019 with LVEF=60-65%, grade 2 diastolic dysfunction. She had uterine cancer and she has completed chemotherapy and radiation therapy in January 2022. She had a total hysterectomy. Echo September 2023 with LVEF=55-60%. No significant valve disease. Nuclear stress test September 2023 with no ischemia. Coreg  stopped in 2023 secondary to dizziness. She moved to [place]  after her husband died. She called in reporting a syncopal episode in March 2025. She stood up quickly to let the dogs in and she felt dizzy, then passed out for a few seconds. I saw her in the office here in April 2025. Her syncope was felt to be orthostatic related.   She called our office last week and reported having an echo done by her cardiologist in [place] . She was concerned about the findings. I have reviewed the echo report which was read as LVEF=55-65% with possible hypokinesis of the inferolateral wall. No valve disease.  She is feeling well overall. She denies any chest pain, dyspnea, palpitations, lower extremity edema, orthopnea, PND, dizziness, near syncope or syncope.    Primary Care Physician: Olen Ingle, MD  Past Medical History:  Diagnosis Date   Adenomatous colon polyp 11/2005   Anginal pain    last time 2016   Anxiety    Arthritis    CAD (coronary artery disease)    2V CABG 1996 (LIMA to LAD, SVG to RCA) Dr. [employer].; last stress test in 2010   Diabetes mellitus without complication (HCC)    type  II   GERD (gastroesophageal reflux disease)    History of radiation therapy    Uterus- [facility] brachytherapy - Dr. Lynwood Nasuti 04/16/20-05/11/20   Hyperlipidemia    Hypertension    Muscle cramps    and pains   Myocardial infarction (HCC)    1996   Pneumonia    hx of x 2    Ulcerative colitis    Uterine cancer (HCC)     Past Surgical History:  Procedure Laterality Date   BACK SURGERY     lumbar fusion 25 years ago and ruptured disk 10 years ago same area   BACK SURGERY     ruptered disc under lumbar disc   CARDIAC CATHETERIZATION N/A 10/13/2014   Procedure: Left Heart Cath And Coronary Angiography;  Surgeon: Lonni JONETTA Cash, MD;  Location: [facility];  Service: Cardiovascular;  Laterality: N/A;   CARDIAC CATHETERIZATION N/A 10/13/2014   Procedure: Coronary Stent Intervention;  Surgeon: Lonni JONETTA Cash, MD;  Location: [facility];  Service: Cardiovascular;  Laterality: N/A;   COLONOSCOPY     CORONARY ANGIOPLASTY WITH STENT PLACEMENT  10/13/2014   DES to MID CIRCUMFLEX   CORONARY ARTERY BYPASS GRAFT  1996   x 2   DILATATION & CURETTAGE/HYSTEROSCOPY WITH MYOSURE N/A 01/03/2020   Procedure: DILATATION & CURETTAGE/HYSTEROSCOPY WITH POSSIBLE MYOSURE;  Surgeon: Estelle Service, MD;  Location: [facility] OR;  Service: Gynecology;  Laterality: N/A;  DILATION AND EVACUATION N/A 01/03/2020   Procedure: DILATATION AND EVACUATION;  Surgeon: Estelle Service, MD;  Location: [facility] OR;  Service: Gynecology;  Laterality: N/A;   IR IMAGING GUIDED PORT INSERTION  03/06/2020   IR REMOVAL TUN ACCESS W/ PORT W/O FL MOD SED  08/24/2020   OPERATIVE ULTRASOUND N/A 01/03/2020   Procedure: OPERATIVE ULTRASOUND;  Surgeon: Estelle Service, MD;  Location: [facility] OR;  Service: Gynecology;  Laterality: N/A;   POLYPECTOMY     ROBOTIC ASSISTED TOTAL HYSTERECTOMY WITH BILATERAL SALPINGO OOPHERECTOMY Bilateral 02/04/2020   Procedure: XI ROBOTIC ASSISTED TOTAL HYSTERECTOMY WITH BILATERAL  SALPINGO OOPHORECTOMY, LYMPH NODE DISSECTION;  Surgeon: Viktoria Comer SAUNDERS, MD;  Location: [facility] ORS;  Service: Gynecology;  Laterality: Bilateral;   SENTINEL NODE BIOPSY N/A 02/04/2020   Procedure: SENTINEL NODE BIOPSY;  Surgeon: Viktoria Comer SAUNDERS, MD;  Location: [facility];  Service: Gynecology;  Laterality: N/A;   UPPER GASTROINTESTINAL ENDOSCOPY      Current Outpatient Medications  Medication Sig Dispense Refill   APRISO  0.375 g 24 hr capsule Take 4 capsules (1.5 g total) by mouth daily. 120 capsule 3   calcium  carbonate (OS-CAL) 600 MG TABS tablet Take 600 mg by mouth daily with breakfast. 650mg      Cholecalciferol (VITAMIN D) 50 MCG (2000 UT) tablet Take 2,000 Units by mouth daily.     cyanocobalamin (VITAMIN B12) 1000 MCG tablet Take 1,000 mcg by mouth daily.     cyclobenzaprine (FLEXERIL) 5 MG tablet Take 5 mg by mouth 3 (three) times daily as needed for muscle spasms.     docusate sodium  (COLACE) 100 MG capsule Take 100 mg by mouth daily as needed for mild constipation.     FLUoxetine  (PROZAC ) 10 MG capsule Take 10 mg by mouth daily.      fluticasone  (FLONASE ) 50 MCG/ACT nasal spray Place 2 sprays into both nostrils daily.     LORazepam  (ATIVAN ) 1 MG tablet Take 1 tablet (1 mg total) by mouth every 8 (eight) hours as needed for anxiety (for anxiety). 60 tablet 0   magnesium  oxide (MAG-OX) 400 (240 Mg) MG tablet Take 400 mg by mouth daily.     Multiple Vitamins-Minerals (MULTIVITAMIN WITH MINERALS) tablet Take 1 tablet by mouth daily.     nitroGLYCERIN  (NITROSTAT ) 0.4 MG SL tablet Place 1 tablet (0.4 mg total) under the tongue every 5 (five) minutes as needed for chest pain. 25 tablet 3   nystatin  (MYCOSTATIN /NYSTOP ) powder Apply 1 Application topically 3 (three) times daily. 15 g 1   pantoprazole  (PROTONIX ) 40 MG tablet Take 1 tablet (40 mg total) by mouth daily. 90 tablet 1   rosuvastatin  (CRESTOR ) 20 MG tablet Take 1 tablet (20 mg total) by mouth daily. 90 tablet 3   clopidogrel  (PLAVIX )  75 MG tablet Take 1 tablet (75 mg total) by mouth daily. 90 tablet 3   olmesartan-hydrochlorothiazide  (BENICAR HCT) 40-12.5 MG tablet Take 1 tablet by mouth daily. 90 tablet 3   No current facility-administered medications for this visit.    Allergies  Allergen Reactions   Metformin     Social History   Socioeconomic History   Marital status: Widowed    Spouse name: Ray   Number of children: 7   Years of education: 12   Highest education level: Not on file  Occupational History   Occupation: retired  Tobacco Use   Smoking status: Former    Current packs/day: 0.00    Types: Cigarettes    Start date: 10/04/1974    Quit  date: 10/04/1994    Years since quitting: 29.5   Smokeless tobacco: Never  Vaping Use   Vaping status: Never Used  Substance and Sexual Activity   Alcohol use: No   Drug use: No   Sexual activity: Not Currently    Partners: Male  Other Topics Concern   Not on file  Social History Narrative   Not on file   Social Drivers of Health   Financial Resource Strain: Not on file  Food Insecurity: Not on file  Transportation Needs: Not on file  Physical Activity: Not on file  Stress: Not on file  Social Connections: Not on file  Intimate Partner Violence: Not on file    Family History  Problem Relation Age of Onset   Diabetes Mother    Heart disease Mother    Heart attack Mother 25   Hypertension Mother    Breast cancer Sister    Heart disease Sister    Hypertension Sister    Stroke Sister    Lung cancer Brother    Colon cancer Brother    Kidney disease Brother        renal cancer   Colon cancer Daughter        Thinks her daughter had negative genetic testing   Breast cancer Daughter    Pancreatitis Other    Stomach cancer Neg Hx     Review of Systems:  As stated in the HPI and otherwise negative.   BP (!) 110/54   Pulse 87   Ht 5' 6 (1.676 m)   Wt 167 lb 3.2 oz (75.8 kg)   SpO2 98%   BMI 26.99 kg/m   Physical Examination: General:  Well developed, well nourished, NAD  HEENT: OP clear, mucus membranes moist  SKIN: warm, dry. No rashes. Neuro: No focal deficits  Musculoskeletal: Muscle strength 5/5 all ext  Psychiatric: Mood and affect normal  Neck: No JVD, no carotid bruits, no thyromegaly, no lymphadenopathy.  Lungs:Clear bilaterally, no wheezes, rhonci, crackles Cardiovascular: Regular rate and rhythm. No murmurs, gallops or rubs. Abdomen:Soft. Bowel sounds present. Non-tender.  Extremities: No lower extremity edema. Pulses are 2 + in the bilateral DP/PT.  EKG:  EKG is not ordered today. The ekg ordered today demonstrates   Recent Labs: 07/12/2023: ALT 10; BUN 12; Creatinine, Ser 1.19; Hemoglobin 11.4; Platelets 257.0; Potassium 3.5; Sodium 140   Lipid Panel    Component Value Date/Time   CHOL 174 07/12/2021 1548   TRIG 123 07/12/2021 1548   HDL 55 07/12/2021 1548   CHOLHDL 3.2 07/12/2021 1548   CHOLHDL 3.0 03/24/2015 0938   VLDL 12 03/24/2015 0938   LDLCALC 97 07/12/2021 1548     Wt Readings from Last 3 Encounters:  04/15/24 167 lb 3.2 oz (75.8 kg)  12/29/23 163 lb 12.8 oz (74.3 kg)  09/20/23 162 lb 6.4 oz (73.7 kg)    Assessment and Plan:   1. CAD without angina: She is s/p 2V CABG in 1996. Last cardiac cath May 2016 at which time a drug eluting stent was placed in the mid Circumflex. LIMA graft was patent to the LAD and the SVG was patent to the PDA. Nuclear stress test in September 2023 with no ischemia. She is not tolerant of beta blockers. Continue Plavix  and statin.   2. HTN: BP is well controlled. Continue current meds.   3. Hyperlipidemia: Lipids followed in primary care. No recent labs. Continue statin.  Will check lipids and LFTs today.   Labs/ tests ordered  today include:  Orders Placed This Encounter  Procedures   Lipid Profile   Hepatic function panel   Disposition:   Follow up with me in 12 months  Signed, Lonni Cash, MD 04/15/2024 4:02 PM    [facility] [address] [place], [place], [place]  [address] Phone: [phone]; Fax: [phone]

## 2024-04-15 NOTE — Patient Instructions (Signed)
 Medication Instructions:  Your physician recommends that you continue on your current medications as directed. Please refer to the Current Medication list given to you today.  *If you need a refill on your cardiac medications before your next appointment, please call your pharmacy*  Lab Work: Have lab work checked today in the lab on the first floor--Lipids and LFTs If you have labs (blood work) drawn today and your tests are completely normal, you will receive your results only by: MyChart Message (if you have MyChart) OR A paper copy in the mail If you have any lab test that is abnormal or we need to change your treatment, we will call you to review the results.  Testing/Procedures: none  Follow-Up: At [facility], you and your health needs are our priority.  As part of our continuing mission to provide you with exceptional heart care, our providers are all part of one team.  This team includes your primary Cardiologist (physician) and Advanced Practice Providers or APPs (Physician Assistants and Nurse Practitioners) who all work together to provide you with the care you need, when you need it.  Your next appointment:   12 month(s)  Provider:   Lonni Cash, MD    We recommend signing up for the patient portal called MyChart.  Sign up information is provided on this After Visit Summary.  MyChart is used to connect with patients for Virtual Visits (Telemedicine).  Patients are able to view lab/test results, encounter notes, upcoming appointments, etc.  Non-urgent messages can be sent to your provider as well.   To learn more about what you can do with MyChart, go to [email or web address].   Other Instructions

## 2024-04-16 ENCOUNTER — Ambulatory Visit: Payer: Self-pay | Admitting: Cardiovascular Disease

## 2024-04-16 LAB — LIPID PANEL
Chol/HDL Ratio: 3.2 ratio (ref 0.0–4.4)
Cholesterol, Total: 139 mg/dL (ref 100–199)
HDL: 44 mg/dL (ref >39)
LDL Chol Calc (NIH): 66 mg/dL (ref 0–99)
Triglycerides: 175 mg/dL — ABNORMAL HIGH (ref 0–149)
VLDL Cholesterol Cal: 29 mg/dL (ref 5–40)

## 2024-04-16 LAB — HEPATIC FUNCTION PANEL
ALT: 13 IU/L (ref 0–32)
AST: 18 IU/L (ref 0–40)
Albumin: 4.2 g/dL (ref 3.8–4.8)
Alkaline Phosphatase: 82 IU/L (ref 49–135)
Bilirubin Total: 0.7 mg/dL (ref 0.0–1.2)
Bilirubin, Direct: 0.21 mg/dL (ref 0.00–0.40)
Total Protein: 6.3 g/dL (ref 6.0–8.5)

## 2024-07-10 ENCOUNTER — Telehealth: Payer: Self-pay | Admitting: Gastroenterology

## 2024-07-10 NOTE — Telephone Encounter (Signed)
 Inbound call from patient stating her insurance will not cover Apriso  but will cover mesalamine .  Patient would like to know if she can be sent in a prescription  Please advise  Thank you

## 2024-07-10 NOTE — Telephone Encounter (Signed)
 This is not a Dr Shila patient, She is Dr Dorsey's patient and I didn't work with [facility] the day she was seen in the office    Thanks

## 2024-07-11 ENCOUNTER — Other Ambulatory Visit: Payer: Self-pay

## 2024-07-14 NOTE — Progress Notes (Incomplete)
 "  Radiation Oncology         [phone] ________________________________  Name: [name] [name] MRN: [id number]  Date: 07/15/2024  DOB: [birth year]  Follow-Up Visit Note  CC: Olen Ingle, MD  Olen Ingle, MD  No diagnosis found.  Diagnosis: Early stage carcinosarcoma of the uterus    Cancer Staging  Carcinosarcoma of uterus [facility]) Staging form: Corpus Uteri - Carcinoma and Carcinosarcoma, AJCC 8th Edition - Clinical stage from 02/04/2020: FIGO Stage IA (cT1a, cN0(sn), cM0) - Signed by Viktoria Comer SAUNDERS, MD on 02/12/2020  Interval Since Last Radiation:  4 years, 2 months, and 11 days  Radiation Treatment Dates: 04/16/2020 through 05/11/2020 Site Technique Total Dose (Gy) Dose per Fx (Gy) Completed Fx Beam Energies  Vagina: Pelvis HDR-brachy 30/30 6 5/5 Ir-192    Narrative:  The patient returns today for a routine annual follow-up visit. She was last seen here for follow-up on 07/17/23.     Since her last visit, she followed up with Dr. Viktoria on 12/29/23. She was noted to be doing well at that time and NED on examination. GU exam performed at that time did indicate atrophy and radiation changes; with agglutination present at the top of the vagina. No nodularity or masses were present in this area.             No other significant interval history since the patient was last seen for follow-up.   ***                  Allergies:  is allergic to metformin.  Meds: Current Outpatient Medications  Medication Sig Dispense Refill   APRISO  0.375 g 24 hr capsule Take 4 capsules (1.5 g total) by mouth daily. 120 capsule 3   calcium  carbonate (OS-CAL) 600 MG TABS tablet Take 600 mg by mouth daily with breakfast. 650mg      Cholecalciferol (VITAMIN D) 50 MCG (2000 UT) tablet Take 2,000 Units by mouth daily.     clopidogrel  (PLAVIX ) 75 MG tablet Take 1 tablet (75 mg total) by mouth daily. 90 tablet 3   cyanocobalamin (VITAMIN B12) 1000 MCG tablet Take 1,000 mcg by mouth daily.      cyclobenzaprine (FLEXERIL) 5 MG tablet Take 5 mg by mouth 3 (three) times daily as needed for muscle spasms.     docusate sodium  (COLACE) 100 MG capsule Take 100 mg by mouth daily as needed for mild constipation.     FLUoxetine  (PROZAC ) 10 MG capsule Take 10 mg by mouth daily.      fluticasone  (FLONASE ) 50 MCG/ACT nasal spray Place 2 sprays into both nostrils daily.     LORazepam  (ATIVAN ) 1 MG tablet Take 1 tablet (1 mg total) by mouth every 8 (eight) hours as needed for anxiety (for anxiety). 60 tablet 0   magnesium  oxide (MAG-OX) 400 (240 Mg) MG tablet Take 400 mg by mouth daily.     Multiple Vitamins-Minerals (MULTIVITAMIN WITH MINERALS) tablet Take 1 tablet by mouth daily.     nitroGLYCERIN  (NITROSTAT ) 0.4 MG SL tablet Place 1 tablet (0.4 mg total) under the tongue every 5 (five) minutes as needed for chest pain. 25 tablet 3   nystatin  (MYCOSTATIN /NYSTOP ) powder Apply 1 Application topically 3 (three) times daily. 15 g 1   olmesartan -hydrochlorothiazide  (BENICAR  HCT) 40-12.5 MG tablet Take 1 tablet by mouth daily. 90 tablet 3   pantoprazole  (PROTONIX ) 40 MG tablet Take 1 tablet (40 mg total) by mouth daily. 90 tablet 1   rosuvastatin  (CRESTOR ) 20 MG  tablet Take 1 tablet (20 mg total) by mouth daily. 90 tablet 3   No current facility-administered medications for this encounter.    Physical Findings: The patient is in no acute distress. Patient is alert and oriented.  vitals were not taken for this visit. .  No significant changes. Lungs are clear to auscultation bilaterally. Heart has regular rate and rhythm. No palpable cervical, supraclavicular, or axillary adenopathy. Abdomen soft, non-tender, normal bowel sounds.  On pelvic examination the external genitalia were unremarkable. A speculum exam was performed. There are no mucosal lesions noted in the vaginal vault. A Pap smear was obtained of the proximal vagina. On bimanual and rectovaginal examination there were no pelvic masses  appreciated. ***   Lab Findings: Lab Results  Component Value Date   WBC 5.8 07/12/2023   HGB 11.4 (L) 07/12/2023   HCT 33.8 (L) 07/12/2023   MCV 92.1 07/12/2023   PLT 257.0 07/12/2023    Radiographic Findings: No results found.  Impression:  Early stage carcinosarcoma of the uterus   The patient is recovering from the effects of radiation.  ***  Plan:  ***   *** minutes of total time was spent for this patient encounter, including preparation, face-to-face counseling with the patient and coordination of care, physical exam, and documentation of the encounter. ____________________________________  Lynwood CHARM Nasuti, PhD, MD  This document serves as a record of services personally performed by Lynwood Nasuti, MD. It was created on his behalf by Dorthy Fuse, a trained medical scribe. The creation of this record is based on the scribe's personal observations and the provider's statements to them. This document has been checked and approved by the attending provider.  "

## 2024-07-15 ENCOUNTER — Ambulatory Visit: Admission: RE | Admit: 2024-07-15 | Payer: Medicare Other | Source: Ambulatory Visit | Admitting: Radiation Oncology

## 2024-07-16 ENCOUNTER — Telehealth: Payer: Self-pay | Admitting: Radiation Oncology

## 2024-07-16 ENCOUNTER — Telehealth: Payer: Self-pay | Admitting: Internal Medicine

## 2024-07-16 NOTE — Telephone Encounter (Signed)
 Called pt to r/s cx appt due to weather. Pt requested to c/b since she did not have her schedule available.

## 2024-07-17 ENCOUNTER — Other Ambulatory Visit: Payer: Self-pay

## 2024-07-17 DIAGNOSIS — K5150 Left sided colitis without complications: Secondary | ICD-10-CM

## 2024-07-17 MED ORDER — APRISO 0.375 G PO CP24: 1500.0000 mg | ORAL_CAPSULE | Freq: Every day | ORAL | 3 refills | Status: DC

## 2024-07-17 NOTE — Telephone Encounter (Signed)
 Refill sent

## 2024-07-19 ENCOUNTER — Telehealth: Admitting: Gastroenterology

## 2024-07-19 ENCOUNTER — Telehealth: Payer: Self-pay | Admitting: Gastroenterology

## 2024-07-19 DIAGNOSIS — Z860100 Personal history of colon polyps, unspecified: Secondary | ICD-10-CM

## 2024-07-19 DIAGNOSIS — K5150 Left sided colitis without complications: Secondary | ICD-10-CM

## 2024-07-19 MED ORDER — MESALAMINE ER 0.375 G PO CP24: 1500.0000 mg | ORAL_CAPSULE | Freq: Every day | ORAL | 4 refills | Status: AC

## 2024-07-19 NOTE — Progress Notes (Signed)
 "  Chief Complaint: Follow-up, medication refills Primary GI Doctor: Dr. Federico  HPI:  Patient is a  79  year old female patient with past medical history of CAD s/p CABG (on Plavix ), uterine CA, and ulcerative colitis presents for medication refill, who presents for follow-up, medication refills.  Patient has history of left-sided ulcerative colitis well-controlled on Apriso /mesalamine  1.5G daily.   I connected with  Cindia VEAR Prier on 07/19/24 by a video enabled telemedicine application and verified that I am speaking with the correct person using two identifiers.   I discussed the limitations of evaluation and management by telemedicine. The patient expressed understanding and agreed to proceed.    Interval History Patient last seen in the GI office on 07/12/2023 by [facility], [facility] for follow-up and medication refills.   Patient presents for follow-up of ulcerative colitis.  Patient's insurance would not cover Apriso  1.5GM po daily, but will cover mesalamine  , she needs new prescription.  Patient denies abdominal pain, diarrhea or blood in stool.  Denies extraintestinal manifestations.   No new medical issues today. No concerns today.  Wt Readings from Last 3 Encounters:  04/15/24 167 lb 3.2 oz (75.8 kg)  12/29/23 163 lb 12.8 oz (74.3 kg)  09/20/23 162 lb 6.4 oz (73.7 kg)    Past Medical History:  Diagnosis Date   Adenomatous colon polyp 11/2005   Anginal pain    last time 2016   Anxiety    Arthritis    CAD (coronary artery disease)    2V CABG 1996 (LIMA to LAD, SVG to RCA) Dr. [employer].; last stress test in 2010   Diabetes mellitus without complication (HCC)    type II   GERD (gastroesophageal reflux disease)    History of radiation therapy    Uterus- [facility] brachytherapy - Dr. Lynwood Nasuti 04/16/20-05/11/20   Hyperlipidemia    Hypertension    Muscle cramps    and pains   Myocardial infarction (HCC)    1996   Pneumonia    hx of x 2    Ulcerative colitis    Uterine  cancer (HCC)     Past Surgical History:  Procedure Laterality Date   BACK SURGERY     lumbar fusion 25 years ago and ruptured disk 10 years ago same area   BACK SURGERY     ruptered disc under lumbar disc   CARDIAC CATHETERIZATION N/A 10/13/2014   Procedure: Left Heart Cath And Coronary Angiography;  Surgeon: Lonni JONETTA Cash, MD;  Location: [facility];  Service: Cardiovascular;  Laterality: N/A;   CARDIAC CATHETERIZATION N/A 10/13/2014   Procedure: Coronary Stent Intervention;  Surgeon: Lonni JONETTA Cash, MD;  Location: [facility];  Service: Cardiovascular;  Laterality: N/A;   COLONOSCOPY     CORONARY ANGIOPLASTY WITH STENT PLACEMENT  10/13/2014   DES to MID CIRCUMFLEX   CORONARY ARTERY BYPASS GRAFT  1996   x 2   DILATATION & CURETTAGE/HYSTEROSCOPY WITH MYOSURE N/A 01/03/2020   Procedure: DILATATION & CURETTAGE/HYSTEROSCOPY WITH POSSIBLE MYOSURE;  Surgeon: Estelle Service, MD;  Location: [facility] OR;  Service: Gynecology;  Laterality: N/A;   DILATION AND EVACUATION N/A 01/03/2020   Procedure: DILATATION AND EVACUATION;  Surgeon: Estelle Service, MD;  Location: [facility] OR;  Service: Gynecology;  Laterality: N/A;   IR IMAGING GUIDED PORT INSERTION  03/06/2020   IR REMOVAL TUN ACCESS W/ PORT W/O FL MOD SED  08/24/2020   OPERATIVE ULTRASOUND N/A 01/03/2020   Procedure: OPERATIVE ULTRASOUND;  Surgeon: Estelle,  Nathanel, MD;  Location: [facility] OR;  Service: Gynecology;  Laterality: N/A;   POLYPECTOMY     ROBOTIC ASSISTED TOTAL HYSTERECTOMY WITH BILATERAL SALPINGO OOPHERECTOMY Bilateral 02/04/2020   Procedure: XI ROBOTIC ASSISTED TOTAL HYSTERECTOMY WITH BILATERAL SALPINGO OOPHORECTOMY, LYMPH NODE DISSECTION;  Surgeon: Viktoria Comer SAUNDERS, MD;  Location: [facility] ORS;  Service: Gynecology;  Laterality: Bilateral;   SENTINEL NODE BIOPSY N/A 02/04/2020   Procedure: SENTINEL NODE BIOPSY;  Surgeon: Viktoria Comer SAUNDERS, MD;  Location: [facility];  Service: Gynecology;  Laterality: N/A;    UPPER GASTROINTESTINAL ENDOSCOPY      Current Outpatient Medications  Medication Sig Dispense Refill   APRISO  0.375 g 24 hr capsule Take 4 capsules (1.5 g total) by mouth daily. 120 capsule 3   calcium  carbonate (OS-CAL) 600 MG TABS tablet Take 600 mg by mouth daily with breakfast. 650mg      Cholecalciferol (VITAMIN D) 50 MCG (2000 UT) tablet Take 2,000 Units by mouth daily.     clopidogrel  (PLAVIX ) 75 MG tablet Take 1 tablet (75 mg total) by mouth daily. 90 tablet 3   cyanocobalamin (VITAMIN B12) 1000 MCG tablet Take 1,000 mcg by mouth daily.     cyclobenzaprine (FLEXERIL) 5 MG tablet Take 5 mg by mouth 3 (three) times daily as needed for muscle spasms.     docusate sodium  (COLACE) 100 MG capsule Take 100 mg by mouth daily as needed for mild constipation.     FLUoxetine  (PROZAC ) 10 MG capsule Take 10 mg by mouth daily.      fluticasone  (FLONASE ) 50 MCG/ACT nasal spray Place 2 sprays into both nostrils daily.     LORazepam  (ATIVAN ) 1 MG tablet Take 1 tablet (1 mg total) by mouth every 8 (eight) hours as needed for anxiety (for anxiety). 60 tablet 0   magnesium  oxide (MAG-OX) 400 (240 Mg) MG tablet Take 400 mg by mouth daily.     Multiple Vitamins-Minerals (MULTIVITAMIN WITH MINERALS) tablet Take 1 tablet by mouth daily.     nitroGLYCERIN  (NITROSTAT ) 0.4 MG SL tablet Place 1 tablet (0.4 mg total) under the tongue every 5 (five) minutes as needed for chest pain. 25 tablet 3   nystatin  (MYCOSTATIN /NYSTOP ) powder Apply 1 Application topically 3 (three) times daily. 15 g 1   olmesartan -hydrochlorothiazide  (BENICAR  HCT) 40-12.5 MG tablet Take 1 tablet by mouth daily. 90 tablet 3   pantoprazole  (PROTONIX ) 40 MG tablet Take 1 tablet (40 mg total) by mouth daily. 90 tablet 1   rosuvastatin  (CRESTOR ) 20 MG tablet Take 1 tablet (20 mg total) by mouth daily. 90 tablet 3   No current facility-administered medications for this visit.    Allergies as of 07/19/2024 - Review Complete 04/15/2024  Allergen  Reaction Noted   Metformin  04/15/2024    Family History  Problem Relation Age of Onset   Diabetes Mother    Heart disease Mother    Heart attack Mother 67   Hypertension Mother    Breast cancer Sister    Heart disease Sister    Hypertension Sister    Stroke Sister    Lung cancer Brother    Colon cancer Brother    Kidney disease Brother        renal cancer   Colon cancer Daughter        Thinks her daughter had negative genetic testing   Breast cancer Daughter    Pancreatitis Other    Stomach cancer Neg Hx     Review of Systems:    Constitutional: No weight  loss, fever, chills, weakness or fatigue HEENT: Eyes: No change in vision               Ears, Nose, Throat:  No change in hearing or congestion Skin: No rash or itching Cardiovascular: No chest pain, chest pressure or palpitations   Respiratory: No SOB or cough Gastrointestinal: See HPI and otherwise negative Genitourinary: No dysuria or change in urinary frequency Neurological: No headache, dizziness or syncope Musculoskeletal: No new muscle or joint pain Hematologic: No bleeding or bruising Psychiatric: No history of depression or anxiety    Physical Exam:  Vital signs: There were no vitals taken for this visit.  Constitutional:   Pleasant female appears to be in NAD, Well developed, Well nourished, alert and cooperative Msk:  Symmetrical without gross deformities. Without edema, no deformity or joint abnormality.  Neurologic:  Alert and  oriented x4;  grossly normal neurologically.   RELEVANT LABS AND IMAGING: CBC    Latest Ref Rng & Units 07/12/2023    3:19 PM 02/11/2022   12:50 AM 08/24/2020   10:30 AM  CBC  WBC 4.0 - 10.5 K/uL 5.8  6.5  3.9   Hemoglobin 12.0 - 15.0 g/dL 88.5  88.4  89.4   Hematocrit 36.0 - 46.0 % 33.8  34.1  30.7   Platelets 150.0 - 400.0 K/uL 257.0  203  188      CMP     Latest Ref Rng & Units 04/15/2024    3:41 PM 07/12/2023    3:19 PM 01/09/2023    1:57 PM  CMP  Glucose 70 - 99  mg/dL  817    BUN 6 - 23 mg/dL  12    Creatinine 9.59 - 1.20 mg/dL  8.80  8.79   Sodium 864 - 145 mEq/L  140    Potassium 3.5 - 5.1 mEq/L  3.5    Chloride 96 - 112 mEq/L  102    CO2 19 - 32 mEq/L  29    Calcium  8.4 - 10.5 mg/dL  9.3    Total Protein 6.0 - 8.5 g/dL 6.3  6.7    Total Bilirubin 0.0 - 1.2 mg/dL 0.7  0.6    Alkaline Phos 49 - 135 IU/L 82  65    AST 0 - 40 IU/L 18  17    ALT 0 - 32 IU/L 13  10       Lab Results  Component Value Date   TSH 0.40 12/12/2011   PREVIOUS GI WORKUP    07/12/23 TB gold negative  Colonoscopy 04/18/2022 - Preparation of the colon was fair.  - One 6 mm polyp in the transverse colon, removed with a cold snare. Resected and retrieved.  - Internal hemorrhoids.  - The examination was otherwise normal on direct views. Biopsied. - Repeat in 3 years versus no repeat due to age   Diagnosis 1. Surgical [P], right colon bx - COLONIC MUCOSA WITHOUT IDENTIFIED ACTIVE COLITIS OR PATHOLOGIC INTRAEPITHELIAL LYMPHOCYTOSIS. - NO CRYPT ARCHITECTURAL DISTORTION, DYSPLASIA OR MALIGNANCY IDENTIFIED. 2. Surgical [P], left colon bx - COLONIC MUCOSA WITHOUT IDENTIFIED ACTIVE COLITIS OR PATHOLOGIC INTRAEPITHELIAL LYMPHOCYTOSIS. - NO CRYPT ARCHITECTURAL DISTORTION, DYSPLASIA OR MALIGNANCY IDENTIFIED. 3. Surgical [P], colon, transverse, polyp (1) - TUBULAR ADENOMA, NEGATIVE FOR HIGH-GRADE DYSPLASIA.  CTAP 12/2022 IMPRESSION: 1. Left kidney cyst. 2. Atheromatous changes. 3. No evidence of recurrent or metastatic disease.     Assessment/Plan: Encounter Diagnoses  Name Primary?   Left sided ulcerative (chronic) colitis (HCC) Yes   History  of colonic polyps    1. Left-sided ulcerative colitis Last colonoscopy in 2023 with fair prep and small tubular adenoma with repeat recommended 3 years. Currently asymptomatic, no GI issues.  - Send script for Mesalamine  (Apriso ) 1.5G daily 30 days refill x 4 -request lab work done with her PCP for records 2. History of  tubular adenomas -recall colonoscopy 04/2025   - Follow-up 1 year (or sooner if symptoms develop)   Thank you for the courtesy of this consult. Please call me with any questions or concerns.   Micaylah Bertucci, FNP-C [facility] 07/19/2024, 1:24 PM  Cc: Olen Ingle, MD  "

## 2024-07-19 NOTE — Patient Instructions (Signed)
 _______________________________________________________  If your blood pressure at your visit was 140/90 or greater, please contact your primary care physician to follow up on this.  _______________________________________________________  If you are age 78 or older, your body mass index should be between 23-30. Your There is no height or weight on file to calculate BMI. If this is out of the aforementioned range listed, please consider follow up with your Primary Care Provider.  If you are age 98 or younger, your body mass index should be between 19-25. Your There is no height or weight on file to calculate BMI. If this is out of the aformentioned range listed, please consider follow up with your Primary Care Provider.   ________________________________________________________  The [facility] providers would like to encourage you to use MYCHART to communicate with providers for non-urgent requests or questions.  Due to long hold times on the telephone, sending your provider a message by [facility] may be a faster and more efficient way to get a response.  Please allow 48 business hours for a response.  Please remember that this is for non-urgent requests.  _______________________________________________________  [facility] is using a team-based approach to care.  Your team is made up of your doctor and two to three APPS. Our APPS (Nurse Practitioners and Physician Assistants) work with your physician to ensure care continuity for you. They are fully qualified to address your health concerns and develop a treatment plan. They communicate directly with your gastroenterologist to care for you. Seeing the Advanced Practice Practitioners on your physician's team can help you by facilitating care more promptly, often allowing for earlier appointments, access to diagnostic testing, procedures, and other specialty referrals.   Thank you for trusting me with your gastrointestinal care. Deanna  May, FNP-C

## 2024-07-19 NOTE — Telephone Encounter (Signed)
 Good Morning [name] [name],  Patient called stating she is expecting bad weather this afternoon where she lives and would like to know if the upcoming appointment this afternoon at 3pm can be switched to a virtual  Please advise  Thank you

## 2024-07-22 ENCOUNTER — Ambulatory Visit: Admitting: Radiation Oncology

## 2025-01-07 ENCOUNTER — Ambulatory Visit: Admitting: Gynecologic Oncology
